# Patient Record
Sex: Male | Born: 1935 | Race: White | Hispanic: No | Marital: Married | Smoking: Never smoker
Health system: Southern US, Community
[De-identification: ages and names within clinical notes are randomized; demographics above are authoritative.]

## PROBLEM LIST (undated history)

## (undated) DIAGNOSIS — I10 Essential (primary) hypertension: Secondary | ICD-10-CM

## (undated) DIAGNOSIS — H269 Unspecified cataract: Secondary | ICD-10-CM

## (undated) DIAGNOSIS — M109 Gout, unspecified: Secondary | ICD-10-CM

## (undated) DIAGNOSIS — I509 Heart failure, unspecified: Secondary | ICD-10-CM

## (undated) DIAGNOSIS — D696 Thrombocytopenia, unspecified: Secondary | ICD-10-CM

## (undated) DIAGNOSIS — D72820 Lymphocytosis (symptomatic): Secondary | ICD-10-CM

## (undated) DIAGNOSIS — E7800 Pure hypercholesterolemia, unspecified: Secondary | ICD-10-CM

## (undated) DIAGNOSIS — C9110 Chronic lymphocytic leukemia of B-cell type not having achieved remission: Secondary | ICD-10-CM

## (undated) DIAGNOSIS — G4730 Sleep apnea, unspecified: Secondary | ICD-10-CM

## (undated) DIAGNOSIS — N320 Bladder-neck obstruction: Secondary | ICD-10-CM

## (undated) DIAGNOSIS — I499 Cardiac arrhythmia, unspecified: Secondary | ICD-10-CM

## (undated) HISTORY — DX: Thrombocytopenia, unspecified: D69.6

## (undated) HISTORY — DX: Sleep apnea, unspecified: G47.30

## (undated) HISTORY — DX: Heart failure, unspecified: I50.9

## (undated) HISTORY — DX: Pure hypercholesterolemia, unspecified: E78.00

## (undated) HISTORY — DX: Cardiac arrhythmia, unspecified: I49.9

## (undated) HISTORY — DX: Lymphocytosis (symptomatic): D72.820

## (undated) HISTORY — DX: Unspecified cataract: H26.9

## (undated) HISTORY — DX: Gout, unspecified: M10.9

## (undated) HISTORY — DX: Bladder-neck obstruction: N32.0

## (undated) HISTORY — DX: Essential (primary) hypertension: I10

## (undated) HISTORY — DX: Chronic lymphocytic leukemia of B-cell type not having achieved remission: C91.10

---

## 2004-02-21 ENCOUNTER — Other Ambulatory Visit: Payer: Self-pay

## 2005-09-09 ENCOUNTER — Ambulatory Visit: Payer: Self-pay | Admitting: Ophthalmology

## 2005-09-16 ENCOUNTER — Ambulatory Visit: Payer: Self-pay | Admitting: Ophthalmology

## 2006-08-11 ENCOUNTER — Ambulatory Visit: Payer: Self-pay | Admitting: Ophthalmology

## 2006-08-18 ENCOUNTER — Ambulatory Visit: Payer: Self-pay | Admitting: Ophthalmology

## 2006-10-04 ENCOUNTER — Ambulatory Visit: Payer: Self-pay | Admitting: Internal Medicine

## 2006-11-02 ENCOUNTER — Ambulatory Visit: Payer: Self-pay | Admitting: Internal Medicine

## 2006-11-02 HISTORY — PX: PROSTATE SURGERY: SHX751

## 2006-12-13 ENCOUNTER — Ambulatory Visit: Payer: Self-pay | Admitting: Internal Medicine

## 2007-01-01 ENCOUNTER — Ambulatory Visit: Payer: Self-pay | Admitting: Internal Medicine

## 2007-02-20 ENCOUNTER — Emergency Department: Payer: Self-pay | Admitting: Emergency Medicine

## 2007-03-11 ENCOUNTER — Ambulatory Visit: Payer: Self-pay | Admitting: Internal Medicine

## 2007-03-14 ENCOUNTER — Ambulatory Visit: Payer: Self-pay | Admitting: Internal Medicine

## 2007-04-03 ENCOUNTER — Ambulatory Visit: Payer: Self-pay | Admitting: Internal Medicine

## 2007-06-03 ENCOUNTER — Ambulatory Visit: Payer: Self-pay | Admitting: Internal Medicine

## 2007-07-04 ENCOUNTER — Ambulatory Visit: Payer: Self-pay | Admitting: Internal Medicine

## 2007-09-12 ENCOUNTER — Ambulatory Visit: Payer: Self-pay | Admitting: Internal Medicine

## 2007-10-03 ENCOUNTER — Ambulatory Visit: Payer: Self-pay | Admitting: Internal Medicine

## 2007-12-04 ENCOUNTER — Ambulatory Visit: Payer: Self-pay | Admitting: Internal Medicine

## 2007-12-05 ENCOUNTER — Ambulatory Visit: Payer: Self-pay | Admitting: Internal Medicine

## 2008-01-01 ENCOUNTER — Ambulatory Visit: Payer: Self-pay | Admitting: Internal Medicine

## 2008-03-02 ENCOUNTER — Ambulatory Visit: Payer: Self-pay | Admitting: Internal Medicine

## 2008-03-05 ENCOUNTER — Ambulatory Visit: Payer: Self-pay | Admitting: Internal Medicine

## 2008-04-02 ENCOUNTER — Ambulatory Visit: Payer: Self-pay | Admitting: Internal Medicine

## 2008-06-02 ENCOUNTER — Ambulatory Visit: Payer: Self-pay | Admitting: Internal Medicine

## 2008-06-11 ENCOUNTER — Ambulatory Visit: Payer: Self-pay | Admitting: Internal Medicine

## 2008-07-03 ENCOUNTER — Ambulatory Visit: Payer: Self-pay | Admitting: Internal Medicine

## 2008-09-02 ENCOUNTER — Ambulatory Visit: Payer: Self-pay | Admitting: Internal Medicine

## 2008-09-03 ENCOUNTER — Ambulatory Visit: Payer: Self-pay | Admitting: Internal Medicine

## 2008-10-02 ENCOUNTER — Ambulatory Visit: Payer: Self-pay | Admitting: Internal Medicine

## 2008-12-03 ENCOUNTER — Ambulatory Visit: Payer: Self-pay | Admitting: Internal Medicine

## 2008-12-10 ENCOUNTER — Ambulatory Visit: Payer: Self-pay | Admitting: Internal Medicine

## 2008-12-31 ENCOUNTER — Ambulatory Visit: Payer: Self-pay | Admitting: Internal Medicine

## 2009-03-09 ENCOUNTER — Emergency Department: Payer: Self-pay | Admitting: Unknown Physician Specialty

## 2009-03-11 ENCOUNTER — Ambulatory Visit: Payer: Self-pay | Admitting: Internal Medicine

## 2009-04-02 ENCOUNTER — Ambulatory Visit: Payer: Self-pay | Admitting: Internal Medicine

## 2009-06-02 ENCOUNTER — Ambulatory Visit: Payer: Self-pay | Admitting: Internal Medicine

## 2009-06-03 ENCOUNTER — Ambulatory Visit: Payer: Self-pay | Admitting: Internal Medicine

## 2009-07-03 ENCOUNTER — Ambulatory Visit: Payer: Self-pay | Admitting: Internal Medicine

## 2009-09-02 ENCOUNTER — Ambulatory Visit: Payer: Self-pay | Admitting: Internal Medicine

## 2009-10-02 ENCOUNTER — Ambulatory Visit: Payer: Self-pay | Admitting: Internal Medicine

## 2009-11-02 ENCOUNTER — Ambulatory Visit: Payer: Self-pay | Admitting: Internal Medicine

## 2009-12-02 ENCOUNTER — Ambulatory Visit: Payer: Self-pay | Admitting: Internal Medicine

## 2009-12-03 ENCOUNTER — Ambulatory Visit: Payer: Self-pay | Admitting: Internal Medicine

## 2010-01-31 ENCOUNTER — Ambulatory Visit: Payer: Self-pay | Admitting: Internal Medicine

## 2010-02-17 ENCOUNTER — Ambulatory Visit: Payer: Self-pay | Admitting: Internal Medicine

## 2010-03-02 ENCOUNTER — Ambulatory Visit: Payer: Self-pay | Admitting: Internal Medicine

## 2010-05-02 ENCOUNTER — Ambulatory Visit: Payer: Self-pay | Admitting: Internal Medicine

## 2010-05-19 ENCOUNTER — Ambulatory Visit: Payer: Self-pay | Admitting: Internal Medicine

## 2010-06-02 ENCOUNTER — Ambulatory Visit: Payer: Self-pay | Admitting: Internal Medicine

## 2010-07-03 ENCOUNTER — Ambulatory Visit: Payer: Self-pay | Admitting: Internal Medicine

## 2010-09-15 ENCOUNTER — Ambulatory Visit: Payer: Self-pay | Admitting: Internal Medicine

## 2010-10-02 ENCOUNTER — Ambulatory Visit: Payer: Self-pay | Admitting: Internal Medicine

## 2010-11-02 ENCOUNTER — Ambulatory Visit: Payer: Self-pay | Admitting: Internal Medicine

## 2011-01-05 ENCOUNTER — Ambulatory Visit: Payer: Self-pay | Admitting: Internal Medicine

## 2011-02-01 ENCOUNTER — Ambulatory Visit: Payer: Self-pay | Admitting: Internal Medicine

## 2011-04-13 ENCOUNTER — Ambulatory Visit: Payer: Self-pay | Admitting: Internal Medicine

## 2011-05-03 ENCOUNTER — Ambulatory Visit: Payer: Self-pay | Admitting: Internal Medicine

## 2011-08-17 ENCOUNTER — Ambulatory Visit: Payer: Self-pay | Admitting: Internal Medicine

## 2011-09-03 ENCOUNTER — Ambulatory Visit: Payer: Self-pay | Admitting: Internal Medicine

## 2011-10-08 ENCOUNTER — Ambulatory Visit: Payer: Self-pay

## 2011-10-12 ENCOUNTER — Ambulatory Visit: Payer: Self-pay | Admitting: Internal Medicine

## 2011-10-13 LAB — PSA: PSA: 3.3 ng/mL (ref 0.0–4.0)

## 2011-11-03 ENCOUNTER — Ambulatory Visit: Payer: Self-pay | Admitting: Internal Medicine

## 2011-11-09 ENCOUNTER — Ambulatory Visit: Payer: Self-pay | Admitting: Urology

## 2011-11-09 DIAGNOSIS — N400 Enlarged prostate without lower urinary tract symptoms: Secondary | ICD-10-CM | POA: Diagnosis not present

## 2011-11-09 DIAGNOSIS — R319 Hematuria, unspecified: Secondary | ICD-10-CM | POA: Diagnosis not present

## 2011-11-09 DIAGNOSIS — N281 Cyst of kidney, acquired: Secondary | ICD-10-CM | POA: Diagnosis not present

## 2011-11-11 DIAGNOSIS — N281 Cyst of kidney, acquired: Secondary | ICD-10-CM | POA: Diagnosis not present

## 2011-11-11 DIAGNOSIS — Z85828 Personal history of other malignant neoplasm of skin: Secondary | ICD-10-CM | POA: Diagnosis not present

## 2011-11-11 DIAGNOSIS — R319 Hematuria, unspecified: Secondary | ICD-10-CM | POA: Diagnosis not present

## 2011-11-11 DIAGNOSIS — D485 Neoplasm of uncertain behavior of skin: Secondary | ICD-10-CM | POA: Diagnosis not present

## 2011-12-03 DIAGNOSIS — D485 Neoplasm of uncertain behavior of skin: Secondary | ICD-10-CM | POA: Diagnosis not present

## 2012-01-12 ENCOUNTER — Ambulatory Visit: Payer: Self-pay | Admitting: Internal Medicine

## 2012-01-12 DIAGNOSIS — E7800 Pure hypercholesterolemia, unspecified: Secondary | ICD-10-CM | POA: Diagnosis not present

## 2012-01-12 DIAGNOSIS — L988 Other specified disorders of the skin and subcutaneous tissue: Secondary | ICD-10-CM | POA: Diagnosis not present

## 2012-01-12 DIAGNOSIS — E669 Obesity, unspecified: Secondary | ICD-10-CM | POA: Diagnosis not present

## 2012-01-12 DIAGNOSIS — Z79899 Other long term (current) drug therapy: Secondary | ICD-10-CM | POA: Diagnosis not present

## 2012-01-12 DIAGNOSIS — R109 Unspecified abdominal pain: Secondary | ICD-10-CM | POA: Diagnosis not present

## 2012-01-12 DIAGNOSIS — C9110 Chronic lymphocytic leukemia of B-cell type not having achieved remission: Secondary | ICD-10-CM | POA: Diagnosis not present

## 2012-01-12 DIAGNOSIS — G4730 Sleep apnea, unspecified: Secondary | ICD-10-CM | POA: Diagnosis not present

## 2012-01-12 DIAGNOSIS — N400 Enlarged prostate without lower urinary tract symptoms: Secondary | ICD-10-CM | POA: Diagnosis not present

## 2012-01-12 DIAGNOSIS — I10 Essential (primary) hypertension: Secondary | ICD-10-CM | POA: Diagnosis not present

## 2012-01-12 DIAGNOSIS — R7989 Other specified abnormal findings of blood chemistry: Secondary | ICD-10-CM | POA: Diagnosis not present

## 2012-01-12 DIAGNOSIS — M109 Gout, unspecified: Secondary | ICD-10-CM | POA: Diagnosis not present

## 2012-01-12 LAB — CBC CANCER CENTER
Basophil #: 0.7 x10 3/mm — ABNORMAL HIGH (ref 0.0–0.1)
Basophil %: 0.8 %
Comment - H1-Com3: NORMAL
Eosinophil #: 0.4 x10 3/mm (ref 0.0–0.7)
HCT: 48.7 % (ref 40.0–52.0)
Lymphocyte %: 85.4 %
Lymphocytes: 92 %
MCH: 30.3 pg (ref 26.0–34.0)
MCHC: 32.5 g/dL (ref 32.0–36.0)
Monocyte #: 2.3 x10 3/mm — ABNORMAL HIGH (ref 0.0–0.7)
Monocyte %: 2.6 %
Monocytes: 2 %
Neutrophil #: 9.5 x10 3/mm — ABNORMAL HIGH (ref 1.4–6.5)
Neutrophil %: 10.7 %
Platelet: 167 x10 3/mm (ref 150–440)
RDW: 13.7 % (ref 11.5–14.5)
Segmented Neutrophils: 6 %

## 2012-01-12 LAB — URINALYSIS, COMPLETE
Bacteria: NEGATIVE
Bilirubin,UR: NEGATIVE
Glucose,UR: NEGATIVE mg/dL (ref 0–75)
Ketone: NEGATIVE
Nitrite: NEGATIVE
Ph: 6 (ref 4.5–8.0)
Squamous Epithelial: NONE SEEN

## 2012-01-12 LAB — COMPREHENSIVE METABOLIC PANEL
Alkaline Phosphatase: 59 U/L (ref 50–136)
Anion Gap: 8 (ref 7–16)
Bilirubin,Total: 0.7 mg/dL (ref 0.2–1.0)
Chloride: 102 mmol/L (ref 98–107)
Co2: 31 mmol/L (ref 21–32)
Creatinine: 1.55 mg/dL — ABNORMAL HIGH (ref 0.60–1.30)
EGFR (African American): 56 — ABNORMAL LOW
EGFR (Non-African Amer.): 46 — ABNORMAL LOW
Osmolality: 285 (ref 275–301)
Potassium: 4.1 mmol/L (ref 3.5–5.1)
Sodium: 141 mmol/L (ref 136–145)
Total Protein: 6.7 g/dL (ref 6.4–8.2)

## 2012-01-14 LAB — URINE CULTURE

## 2012-01-19 DIAGNOSIS — R7989 Other specified abnormal findings of blood chemistry: Secondary | ICD-10-CM | POA: Diagnosis not present

## 2012-01-19 DIAGNOSIS — C9110 Chronic lymphocytic leukemia of B-cell type not having achieved remission: Secondary | ICD-10-CM | POA: Diagnosis not present

## 2012-01-19 DIAGNOSIS — R109 Unspecified abdominal pain: Secondary | ICD-10-CM | POA: Diagnosis not present

## 2012-01-19 LAB — CBC CANCER CENTER
Comment - H1-Com2: NORMAL
Comment - H1-Com3: NORMAL
HCT: 48.3 % (ref 40.0–52.0)
Lymphocytes: 95 %
Platelet: 160 x10 3/mm (ref 150–440)
RBC: 5.19 10*6/uL (ref 4.40–5.90)
Segmented Neutrophils: 4 %
WBC: 90.6 x10 3/mm — ABNORMAL HIGH (ref 3.8–10.6)

## 2012-01-19 LAB — CREATININE, SERUM
Creatinine: 1.51 mg/dL — ABNORMAL HIGH (ref 0.60–1.30)
EGFR (Non-African Amer.): 48 — ABNORMAL LOW

## 2012-02-01 ENCOUNTER — Ambulatory Visit: Payer: Self-pay | Admitting: Internal Medicine

## 2012-03-08 ENCOUNTER — Ambulatory Visit: Payer: Self-pay | Admitting: Internal Medicine

## 2012-03-08 DIAGNOSIS — C9110 Chronic lymphocytic leukemia of B-cell type not having achieved remission: Secondary | ICD-10-CM | POA: Diagnosis not present

## 2012-03-08 LAB — CBC CANCER CENTER
Basophil %: 0.3 %
Eosinophil #: 0.4 x10 3/mm (ref 0.0–0.7)
Eosinophil %: 0.5 %
HCT: 47.2 % (ref 40.0–52.0)
Lymphocyte #: 68.7 x10 3/mm — ABNORMAL HIGH (ref 1.0–3.6)
Lymphocyte %: 89.3 %
MCH: 30.1 pg (ref 26.0–34.0)
MCHC: 31.9 g/dL — ABNORMAL LOW (ref 32.0–36.0)
MCV: 94 fL (ref 80–100)
Monocyte %: 2.1 %
Neutrophil #: 6 x10 3/mm (ref 1.4–6.5)
Neutrophil %: 7.8 %
RBC: 5.01 10*6/uL (ref 4.40–5.90)

## 2012-03-08 LAB — URINALYSIS, COMPLETE
Bacteria: NEGATIVE
Glucose,UR: NEGATIVE mg/dL (ref 0–75)
Ketone: NEGATIVE
Leukocyte Esterase: NEGATIVE
Ph: 7 (ref 4.5–8.0)
Squamous Epithelial: NONE SEEN

## 2012-03-08 LAB — CREATININE, SERUM
Creatinine: 1.39 mg/dL — ABNORMAL HIGH (ref 0.60–1.30)
EGFR (African American): 55 — ABNORMAL LOW
EGFR (Non-African Amer.): 48 — ABNORMAL LOW

## 2012-04-02 ENCOUNTER — Ambulatory Visit: Payer: Self-pay | Admitting: Internal Medicine

## 2012-04-24 DIAGNOSIS — I499 Cardiac arrhythmia, unspecified: Secondary | ICD-10-CM

## 2012-05-24 ENCOUNTER — Ambulatory Visit: Payer: Self-pay | Admitting: Internal Medicine

## 2012-05-24 DIAGNOSIS — I10 Essential (primary) hypertension: Secondary | ICD-10-CM | POA: Diagnosis not present

## 2012-05-24 DIAGNOSIS — E7800 Pure hypercholesterolemia, unspecified: Secondary | ICD-10-CM | POA: Diagnosis not present

## 2012-05-24 DIAGNOSIS — Z79899 Other long term (current) drug therapy: Secondary | ICD-10-CM | POA: Diagnosis not present

## 2012-05-24 DIAGNOSIS — I455 Other specified heart block: Secondary | ICD-10-CM | POA: Diagnosis not present

## 2012-05-24 DIAGNOSIS — M109 Gout, unspecified: Secondary | ICD-10-CM | POA: Diagnosis not present

## 2012-05-24 DIAGNOSIS — I4949 Other premature depolarization: Secondary | ICD-10-CM | POA: Diagnosis not present

## 2012-05-24 DIAGNOSIS — C9110 Chronic lymphocytic leukemia of B-cell type not having achieved remission: Secondary | ICD-10-CM | POA: Diagnosis not present

## 2012-05-24 DIAGNOSIS — G4730 Sleep apnea, unspecified: Secondary | ICD-10-CM | POA: Diagnosis not present

## 2012-05-24 DIAGNOSIS — N400 Enlarged prostate without lower urinary tract symptoms: Secondary | ICD-10-CM | POA: Diagnosis not present

## 2012-05-24 LAB — URIC ACID: Uric Acid: 8.5 mg/dL — ABNORMAL HIGH (ref 3.5–7.2)

## 2012-05-24 LAB — CBC CANCER CENTER
Basophil #: 0.2 x10 3/mm — ABNORMAL HIGH (ref 0.0–0.1)
Eosinophil #: 0.3 x10 3/mm (ref 0.0–0.7)
HGB: 14.9 g/dL (ref 13.0–18.0)
Lymphocyte %: 89.9 %
MCH: 30.3 pg (ref 26.0–34.0)
MCHC: 32.2 g/dL (ref 32.0–36.0)
Monocyte #: 0.6 x10 3/mm (ref 0.2–1.0)
Monocyte %: 0.9 %
Neutrophil #: 5.4 x10 3/mm (ref 1.4–6.5)
Neutrophil %: 8.4 %
Platelet: 130 x10 3/mm — ABNORMAL LOW (ref 150–440)
RBC: 4.93 10*6/uL (ref 4.40–5.90)
RDW: 14.6 % — ABNORMAL HIGH (ref 11.5–14.5)
WBC: 65 x10 3/mm — ABNORMAL HIGH (ref 3.8–10.6)

## 2012-05-24 LAB — LACTATE DEHYDROGENASE: LDH: 145 U/L (ref 81–234)

## 2012-06-02 ENCOUNTER — Ambulatory Visit: Payer: Self-pay | Admitting: Internal Medicine

## 2012-06-02 DIAGNOSIS — C9110 Chronic lymphocytic leukemia of B-cell type not having achieved remission: Secondary | ICD-10-CM | POA: Diagnosis not present

## 2012-06-21 LAB — CBC CANCER CENTER
Basophil #: 0.1 x10 3/mm (ref 0.0–0.1)
Basophil %: 0.2 %
Eosinophil #: 0.3 x10 3/mm (ref 0.0–0.7)
Eosinophil %: 0.4 %
HCT: 47.3 % (ref 40.0–52.0)
HGB: 15.3 g/dL (ref 13.0–18.0)
Lymphocyte #: 66.6 x10 3/mm — ABNORMAL HIGH (ref 1.0–3.6)
Lymphocyte %: 89 %
MCH: 30.4 pg (ref 26.0–34.0)
MCHC: 32.4 g/dL (ref 32.0–36.0)
MCV: 94 fL (ref 80–100)
Monocyte #: 1.2 x10 3/mm — ABNORMAL HIGH (ref 0.2–1.0)
Neutrophil #: 6.6 x10 3/mm — ABNORMAL HIGH (ref 1.4–6.5)
Neutrophil %: 8.8 %
RDW: 15.1 % — ABNORMAL HIGH (ref 11.5–14.5)

## 2012-07-03 ENCOUNTER — Ambulatory Visit: Payer: Self-pay | Admitting: Internal Medicine

## 2012-08-16 ENCOUNTER — Ambulatory Visit: Payer: Self-pay | Admitting: Internal Medicine

## 2012-08-16 DIAGNOSIS — C9110 Chronic lymphocytic leukemia of B-cell type not having achieved remission: Secondary | ICD-10-CM | POA: Diagnosis not present

## 2012-08-16 DIAGNOSIS — M109 Gout, unspecified: Secondary | ICD-10-CM | POA: Diagnosis not present

## 2012-08-16 DIAGNOSIS — Z79899 Other long term (current) drug therapy: Secondary | ICD-10-CM | POA: Diagnosis not present

## 2012-08-16 DIAGNOSIS — G4730 Sleep apnea, unspecified: Secondary | ICD-10-CM | POA: Diagnosis not present

## 2012-08-16 DIAGNOSIS — E7800 Pure hypercholesterolemia, unspecified: Secondary | ICD-10-CM | POA: Diagnosis not present

## 2012-08-16 DIAGNOSIS — I10 Essential (primary) hypertension: Secondary | ICD-10-CM | POA: Diagnosis not present

## 2012-08-16 LAB — CBC CANCER CENTER
Eosinophil #: 0.3 x10 3/mm (ref 0.0–0.7)
Eosinophil %: 0.3 %
HCT: 49.7 % (ref 40.0–52.0)
Lymphocyte #: 78.7 x10 3/mm — ABNORMAL HIGH (ref 1.0–3.6)
Lymphocyte %: 90.3 %
MCV: 95 fL (ref 80–100)
Monocyte %: 1.4 %
Neutrophil #: 6.9 x10 3/mm — ABNORMAL HIGH (ref 1.4–6.5)
Neutrophil %: 7.9 %
RBC: 5.23 10*6/uL (ref 4.40–5.90)
RDW: 15.6 % — ABNORMAL HIGH (ref 11.5–14.5)
WBC: 87.2 x10 3/mm — ABNORMAL HIGH (ref 3.8–10.6)

## 2012-08-16 LAB — URIC ACID: Uric Acid: 6.8 mg/dL (ref 3.5–7.2)

## 2012-09-02 ENCOUNTER — Ambulatory Visit: Payer: Self-pay | Admitting: Internal Medicine

## 2012-10-17 ENCOUNTER — Ambulatory Visit: Payer: Self-pay | Admitting: Internal Medicine

## 2012-10-17 DIAGNOSIS — C9110 Chronic lymphocytic leukemia of B-cell type not having achieved remission: Secondary | ICD-10-CM | POA: Diagnosis not present

## 2012-10-17 LAB — CBC CANCER CENTER
Basophil #: 0.1 x10 3/mm (ref 0.0–0.1)
Basophil %: 0.1 %
HCT: 46.6 % (ref 40.0–52.0)
HGB: 14.9 g/dL (ref 13.0–18.0)
MCHC: 31.9 g/dL — ABNORMAL LOW (ref 32.0–36.0)
MCV: 94 fL (ref 80–100)
Monocyte %: 1.6 %
Neutrophil %: 11 %
Platelet: 136 x10 3/mm — ABNORMAL LOW (ref 150–440)
RBC: 4.99 10*6/uL (ref 4.40–5.90)
RDW: 14.9 % — ABNORMAL HIGH (ref 11.5–14.5)
WBC: 85.4 x10 3/mm — ABNORMAL HIGH (ref 3.8–10.6)

## 2012-10-17 LAB — LACTATE DEHYDROGENASE: LDH: 170 U/L (ref 85–241)

## 2012-11-02 ENCOUNTER — Ambulatory Visit: Payer: Self-pay | Admitting: Internal Medicine

## 2012-12-06 ENCOUNTER — Ambulatory Visit: Payer: Self-pay | Admitting: Internal Medicine

## 2012-12-06 DIAGNOSIS — E7800 Pure hypercholesterolemia, unspecified: Secondary | ICD-10-CM | POA: Diagnosis not present

## 2012-12-06 DIAGNOSIS — C9110 Chronic lymphocytic leukemia of B-cell type not having achieved remission: Secondary | ICD-10-CM | POA: Diagnosis not present

## 2012-12-06 DIAGNOSIS — G4730 Sleep apnea, unspecified: Secondary | ICD-10-CM | POA: Diagnosis not present

## 2012-12-06 DIAGNOSIS — M109 Gout, unspecified: Secondary | ICD-10-CM | POA: Diagnosis not present

## 2012-12-06 DIAGNOSIS — Z8719 Personal history of other diseases of the digestive system: Secondary | ICD-10-CM | POA: Diagnosis not present

## 2012-12-06 DIAGNOSIS — Z79899 Other long term (current) drug therapy: Secondary | ICD-10-CM | POA: Diagnosis not present

## 2012-12-06 DIAGNOSIS — I10 Essential (primary) hypertension: Secondary | ICD-10-CM | POA: Diagnosis not present

## 2012-12-06 LAB — CBC CANCER CENTER
Basophil #: 0.2 x10 3/mm — ABNORMAL HIGH (ref 0.0–0.1)
Eosinophil #: 0.2 x10 3/mm (ref 0.0–0.7)
Eosinophil %: 0.3 %
Lymphocyte #: 68.9 x10 3/mm — ABNORMAL HIGH (ref 1.0–3.6)
Lymphocyte %: 89.8 %
MCHC: 31.4 g/dL — ABNORMAL LOW (ref 32.0–36.0)
MCV: 94 fL (ref 80–100)
Neutrophil %: 8.6 %
Platelet: 142 x10 3/mm — ABNORMAL LOW (ref 150–440)
RDW: 15.2 % — ABNORMAL HIGH (ref 11.5–14.5)
WBC: 76.7 x10 3/mm — ABNORMAL HIGH (ref 3.8–10.6)

## 2012-12-06 LAB — CREATININE, SERUM: Creatinine: 1.54 mg/dL — ABNORMAL HIGH (ref 0.60–1.30)

## 2012-12-06 LAB — LACTATE DEHYDROGENASE: LDH: 152 U/L (ref 85–241)

## 2012-12-06 LAB — URIC ACID: Uric Acid: 5.8 mg/dL (ref 3.5–7.2)

## 2012-12-31 ENCOUNTER — Ambulatory Visit: Payer: Self-pay | Admitting: Internal Medicine

## 2013-01-31 ENCOUNTER — Ambulatory Visit: Payer: Self-pay | Admitting: Internal Medicine

## 2013-01-31 DIAGNOSIS — C9110 Chronic lymphocytic leukemia of B-cell type not having achieved remission: Secondary | ICD-10-CM | POA: Diagnosis not present

## 2013-02-06 LAB — CBC CANCER CENTER
Basophil #: 0.3 x10 3/mm — ABNORMAL HIGH (ref 0.0–0.1)
Basophil %: 0.3 %
HCT: 47.5 % (ref 40.0–52.0)
Lymphocyte #: 81.7 x10 3/mm — ABNORMAL HIGH (ref 1.0–3.6)
Lymphocyte %: 90.8 %
MCH: 29.7 pg (ref 26.0–34.0)
MCV: 93 fL (ref 80–100)
Neutrophil #: 6.5 x10 3/mm (ref 1.4–6.5)
Neutrophil %: 7.2 %
Platelet: 154 x10 3/mm (ref 150–440)

## 2013-02-06 LAB — CREATININE, SERUM
Creatinine: 1.75 mg/dL — ABNORMAL HIGH (ref 0.60–1.30)
EGFR (African American): 42 — ABNORMAL LOW
EGFR (Non-African Amer.): 36 — ABNORMAL LOW

## 2013-02-06 LAB — LACTATE DEHYDROGENASE: LDH: 179 U/L (ref 85–241)

## 2013-03-02 ENCOUNTER — Ambulatory Visit: Payer: Self-pay | Admitting: Internal Medicine

## 2013-04-03 ENCOUNTER — Ambulatory Visit: Payer: Self-pay | Admitting: Internal Medicine

## 2013-04-03 DIAGNOSIS — C9110 Chronic lymphocytic leukemia of B-cell type not having achieved remission: Secondary | ICD-10-CM | POA: Diagnosis not present

## 2013-04-03 LAB — URIC ACID: Uric Acid: 6.1 mg/dL (ref 3.5–7.2)

## 2013-04-03 LAB — CBC CANCER CENTER
Comment - H1-Com1: NORMAL
HCT: 47.4 % (ref 40.0–52.0)
Lymphocytes: 92 %
MCHC: 32.1 g/dL (ref 32.0–36.0)
MCV: 92 fL (ref 80–100)
Monocytes: 1 %
RDW: 15.6 % — ABNORMAL HIGH (ref 11.5–14.5)
WBC: 92.8 x10 3/mm — ABNORMAL HIGH (ref 3.8–10.6)

## 2013-04-03 LAB — CREATININE, SERUM
EGFR (African American): 49 — ABNORMAL LOW
EGFR (Non-African Amer.): 42 — ABNORMAL LOW

## 2013-05-02 ENCOUNTER — Ambulatory Visit: Payer: Self-pay | Admitting: Internal Medicine

## 2013-06-06 ENCOUNTER — Ambulatory Visit: Payer: Self-pay | Admitting: Internal Medicine

## 2013-06-06 DIAGNOSIS — N400 Enlarged prostate without lower urinary tract symptoms: Secondary | ICD-10-CM | POA: Diagnosis not present

## 2013-06-06 DIAGNOSIS — G4730 Sleep apnea, unspecified: Secondary | ICD-10-CM | POA: Diagnosis not present

## 2013-06-06 DIAGNOSIS — Z79899 Other long term (current) drug therapy: Secondary | ICD-10-CM | POA: Diagnosis not present

## 2013-06-06 DIAGNOSIS — M109 Gout, unspecified: Secondary | ICD-10-CM | POA: Diagnosis not present

## 2013-06-06 DIAGNOSIS — I10 Essential (primary) hypertension: Secondary | ICD-10-CM | POA: Diagnosis not present

## 2013-06-06 DIAGNOSIS — C9110 Chronic lymphocytic leukemia of B-cell type not having achieved remission: Secondary | ICD-10-CM | POA: Diagnosis not present

## 2013-06-06 DIAGNOSIS — E7800 Pure hypercholesterolemia, unspecified: Secondary | ICD-10-CM | POA: Diagnosis not present

## 2013-06-06 LAB — CBC CANCER CENTER
Eosinophil #: 0.4 x10 3/mm (ref 0.0–0.7)
Eosinophil %: 0.6 %
HCT: 47.8 % (ref 40.0–52.0)
Lymphocyte #: 71.7 x10 3/mm — ABNORMAL HIGH (ref 1.0–3.6)
Lymphocyte %: 89.5 %
MCH: 29.7 pg (ref 26.0–34.0)
MCHC: 31.9 g/dL — ABNORMAL LOW (ref 32.0–36.0)
MCV: 93 fL (ref 80–100)
Monocyte #: 1 x10 3/mm (ref 0.2–1.0)
Neutrophil %: 8.3 %
Platelet: 128 x10 3/mm — ABNORMAL LOW (ref 150–440)
RBC: 5.13 10*6/uL (ref 4.40–5.90)
WBC: 80.1 x10 3/mm — ABNORMAL HIGH (ref 3.8–10.6)

## 2013-06-06 LAB — CREATININE, SERUM
EGFR (African American): 50 — ABNORMAL LOW
EGFR (Non-African Amer.): 43 — ABNORMAL LOW

## 2013-06-06 LAB — URIC ACID: Uric Acid: 5.6 mg/dL (ref 3.5–7.2)

## 2013-06-06 LAB — LACTATE DEHYDROGENASE: LDH: 146 U/L (ref 85–241)

## 2013-07-03 ENCOUNTER — Ambulatory Visit: Payer: Self-pay | Admitting: Internal Medicine

## 2013-08-08 ENCOUNTER — Ambulatory Visit: Payer: Self-pay | Admitting: Internal Medicine

## 2013-08-08 DIAGNOSIS — C9110 Chronic lymphocytic leukemia of B-cell type not having achieved remission: Secondary | ICD-10-CM | POA: Diagnosis not present

## 2013-08-08 LAB — CBC CANCER CENTER
HCT: 48 % (ref 40.0–52.0)
HGB: 15 g/dL (ref 13.0–18.0)
Lymphocyte #: 73.6 x10 3/mm — ABNORMAL HIGH (ref 1.0–3.6)
MCHC: 31.3 g/dL — ABNORMAL LOW (ref 32.0–36.0)
MCV: 93 fL (ref 80–100)
Monocyte #: 0.8 x10 3/mm (ref 0.2–1.0)
Monocyte %: 0.9 %
Neutrophil #: 6.3 x10 3/mm (ref 1.4–6.5)
Neutrophil %: 7.7 %
Platelet: 123 x10 3/mm — ABNORMAL LOW (ref 150–440)
WBC: 81.1 x10 3/mm — ABNORMAL HIGH (ref 3.8–10.6)

## 2013-08-08 LAB — CREATININE, SERUM
Creatinine: 1.52 mg/dL — ABNORMAL HIGH (ref 0.60–1.30)
EGFR (African American): 49 — ABNORMAL LOW

## 2013-08-08 LAB — URIC ACID: Uric Acid: 5.3 mg/dL (ref 3.5–7.2)

## 2013-09-02 ENCOUNTER — Ambulatory Visit: Payer: Self-pay | Admitting: Internal Medicine

## 2013-10-03 ENCOUNTER — Ambulatory Visit: Payer: Self-pay | Admitting: Internal Medicine

## 2013-10-03 DIAGNOSIS — C9110 Chronic lymphocytic leukemia of B-cell type not having achieved remission: Secondary | ICD-10-CM | POA: Diagnosis not present

## 2013-10-03 LAB — CBC CANCER CENTER
Basophil %: 0.2 %
HGB: 14.9 g/dL (ref 13.0–18.0)
Lymphocyte #: 78.1 x10 3/mm — ABNORMAL HIGH (ref 1.0–3.6)
Lymphocyte %: 91.8 %
MCH: 30 pg (ref 26.0–34.0)
MCHC: 31.5 g/dL — ABNORMAL LOW (ref 32.0–36.0)
MCV: 95 fL (ref 80–100)
Platelet: 127 x10 3/mm — ABNORMAL LOW (ref 150–440)
RBC: 4.97 10*6/uL (ref 4.40–5.90)
RDW: 15.4 % — ABNORMAL HIGH (ref 11.5–14.5)

## 2013-10-03 LAB — LACTATE DEHYDROGENASE: LDH: 159 U/L (ref 85–241)

## 2013-10-03 LAB — CREATININE, SERUM
EGFR (African American): 58 — ABNORMAL LOW
EGFR (Non-African Amer.): 50 — ABNORMAL LOW

## 2013-10-03 LAB — URIC ACID: Uric Acid: 6.8 mg/dL (ref 3.5–7.2)

## 2013-11-02 ENCOUNTER — Ambulatory Visit: Payer: Self-pay | Admitting: Internal Medicine

## 2013-12-05 ENCOUNTER — Ambulatory Visit: Payer: Self-pay | Admitting: Internal Medicine

## 2013-12-05 DIAGNOSIS — C9110 Chronic lymphocytic leukemia of B-cell type not having achieved remission: Secondary | ICD-10-CM | POA: Diagnosis not present

## 2013-12-05 DIAGNOSIS — Z808 Family history of malignant neoplasm of other organs or systems: Secondary | ICD-10-CM | POA: Diagnosis not present

## 2013-12-05 DIAGNOSIS — R32 Unspecified urinary incontinence: Secondary | ICD-10-CM | POA: Diagnosis not present

## 2013-12-05 DIAGNOSIS — G4730 Sleep apnea, unspecified: Secondary | ICD-10-CM | POA: Diagnosis not present

## 2013-12-05 DIAGNOSIS — Z79899 Other long term (current) drug therapy: Secondary | ICD-10-CM | POA: Diagnosis not present

## 2013-12-05 DIAGNOSIS — M109 Gout, unspecified: Secondary | ICD-10-CM | POA: Diagnosis not present

## 2013-12-05 DIAGNOSIS — Z801 Family history of malignant neoplasm of trachea, bronchus and lung: Secondary | ICD-10-CM | POA: Diagnosis not present

## 2013-12-05 DIAGNOSIS — Z8042 Family history of malignant neoplasm of prostate: Secondary | ICD-10-CM | POA: Diagnosis not present

## 2013-12-05 DIAGNOSIS — N400 Enlarged prostate without lower urinary tract symptoms: Secondary | ICD-10-CM | POA: Diagnosis not present

## 2013-12-05 DIAGNOSIS — E7800 Pure hypercholesterolemia, unspecified: Secondary | ICD-10-CM | POA: Diagnosis not present

## 2013-12-05 DIAGNOSIS — I10 Essential (primary) hypertension: Secondary | ICD-10-CM | POA: Diagnosis not present

## 2013-12-05 LAB — CBC CANCER CENTER
BLAST: 3 % — AB
COMMENT - H1-COM4: NORMAL
HCT: 49.8 % (ref 40.0–52.0)
HGB: 15.5 g/dL (ref 13.0–18.0)
LYMPHS PCT: 87 %
MCH: 29.3 pg (ref 26.0–34.0)
MCHC: 31.1 g/dL — AB (ref 32.0–36.0)
MCV: 94 fL (ref 80–100)
MONOS PCT: 3 %
Platelet: 141 x10 3/mm — ABNORMAL LOW (ref 150–440)
RBC: 5.28 10*6/uL (ref 4.40–5.90)
RDW: 14.9 % — ABNORMAL HIGH (ref 11.5–14.5)
Segmented Neutrophils: 7 %
WBC: 93.9 x10 3/mm — ABNORMAL HIGH (ref 3.8–10.6)

## 2013-12-05 LAB — CREATININE, SERUM
Creatinine: 1.5 mg/dL — ABNORMAL HIGH (ref 0.60–1.30)
EGFR (African American): 50 — ABNORMAL LOW
EGFR (Non-African Amer.): 43 — ABNORMAL LOW

## 2013-12-05 LAB — LACTATE DEHYDROGENASE: LDH: 169 U/L (ref 85–241)

## 2013-12-05 LAB — URIC ACID: Uric Acid: 5.1 mg/dL (ref 3.5–7.2)

## 2013-12-06 DIAGNOSIS — N401 Enlarged prostate with lower urinary tract symptoms: Secondary | ICD-10-CM | POA: Diagnosis not present

## 2013-12-06 DIAGNOSIS — N138 Other obstructive and reflux uropathy: Secondary | ICD-10-CM | POA: Diagnosis not present

## 2013-12-11 DIAGNOSIS — R972 Elevated prostate specific antigen [PSA]: Secondary | ICD-10-CM | POA: Diagnosis not present

## 2013-12-18 DIAGNOSIS — N410 Acute prostatitis: Secondary | ICD-10-CM | POA: Diagnosis not present

## 2013-12-29 DIAGNOSIS — N320 Bladder-neck obstruction: Secondary | ICD-10-CM | POA: Diagnosis not present

## 2013-12-29 DIAGNOSIS — N138 Other obstructive and reflux uropathy: Secondary | ICD-10-CM | POA: Diagnosis not present

## 2013-12-29 DIAGNOSIS — R972 Elevated prostate specific antigen [PSA]: Secondary | ICD-10-CM | POA: Diagnosis not present

## 2013-12-29 DIAGNOSIS — N401 Enlarged prostate with lower urinary tract symptoms: Secondary | ICD-10-CM | POA: Diagnosis not present

## 2013-12-31 ENCOUNTER — Ambulatory Visit: Payer: Self-pay | Admitting: Internal Medicine

## 2014-01-23 DIAGNOSIS — N401 Enlarged prostate with lower urinary tract symptoms: Secondary | ICD-10-CM | POA: Diagnosis not present

## 2014-01-23 DIAGNOSIS — N138 Other obstructive and reflux uropathy: Secondary | ICD-10-CM | POA: Diagnosis not present

## 2014-01-30 DIAGNOSIS — N401 Enlarged prostate with lower urinary tract symptoms: Secondary | ICD-10-CM | POA: Diagnosis not present

## 2014-01-30 DIAGNOSIS — N138 Other obstructive and reflux uropathy: Secondary | ICD-10-CM | POA: Diagnosis not present

## 2014-02-06 ENCOUNTER — Ambulatory Visit: Payer: Self-pay | Admitting: Internal Medicine

## 2014-02-06 DIAGNOSIS — C9110 Chronic lymphocytic leukemia of B-cell type not having achieved remission: Secondary | ICD-10-CM | POA: Diagnosis not present

## 2014-02-06 LAB — URIC ACID: URIC ACID: 5.5 mg/dL (ref 3.5–7.2)

## 2014-02-06 LAB — CBC CANCER CENTER
HCT: 46.8 % (ref 40.0–52.0)
HGB: 14.5 g/dL (ref 13.0–18.0)
Lymphocytes: 89 %
MCH: 29.6 pg (ref 26.0–34.0)
MCHC: 31.1 g/dL — AB (ref 32.0–36.0)
MCV: 95 fL (ref 80–100)
MONOS PCT: 2 %
PLATELETS: 137 x10 3/mm — AB (ref 150–440)
RBC: 4.92 10*6/uL (ref 4.40–5.90)
RDW: 15.3 % — AB (ref 11.5–14.5)
SEGMENTED NEUTROPHILS: 9 %
WBC: 100.6 x10 3/mm — AB (ref 3.8–10.6)

## 2014-02-06 LAB — LACTATE DEHYDROGENASE: LDH: 150 U/L (ref 85–241)

## 2014-02-06 LAB — CREATININE, SERUM
CREATININE: 1.52 mg/dL — AB (ref 0.60–1.30)
EGFR (African American): 49 — ABNORMAL LOW
EGFR (Non-African Amer.): 42 — ABNORMAL LOW

## 2014-02-28 DIAGNOSIS — R972 Elevated prostate specific antigen [PSA]: Secondary | ICD-10-CM | POA: Diagnosis not present

## 2014-03-02 ENCOUNTER — Ambulatory Visit: Payer: Self-pay | Admitting: Internal Medicine

## 2014-04-03 ENCOUNTER — Ambulatory Visit: Payer: Self-pay | Admitting: Internal Medicine

## 2014-04-03 DIAGNOSIS — C9110 Chronic lymphocytic leukemia of B-cell type not having achieved remission: Secondary | ICD-10-CM | POA: Diagnosis not present

## 2014-04-03 LAB — CBC CANCER CENTER
BASOS ABS: 0.3 x10 3/mm — AB (ref 0.0–0.1)
Basophil %: 0.3 %
Eosinophil #: 0.2 x10 3/mm (ref 0.0–0.7)
Eosinophil %: 0.2 %
HCT: 46.4 % (ref 40.0–52.0)
HGB: 14.5 g/dL (ref 13.0–18.0)
LYMPHS ABS: 90.4 x10 3/mm — AB (ref 1.0–3.6)
LYMPHS PCT: 91.5 %
MCH: 29.5 pg (ref 26.0–34.0)
MCHC: 31.2 g/dL — AB (ref 32.0–36.0)
MCV: 95 fL (ref 80–100)
MONO ABS: 1 x10 3/mm (ref 0.2–1.0)
MONOS PCT: 1 %
NEUTROS ABS: 6.9 x10 3/mm — AB (ref 1.4–6.5)
NEUTROS PCT: 7 %
Platelet: 125 x10 3/mm — ABNORMAL LOW (ref 150–440)
RBC: 4.89 10*6/uL (ref 4.40–5.90)
RDW: 15.3 % — AB (ref 11.5–14.5)
WBC: 98.8 x10 3/mm — ABNORMAL HIGH (ref 3.8–10.6)

## 2014-04-03 LAB — CREATININE, SERUM
Creatinine: 1.37 mg/dL — ABNORMAL HIGH (ref 0.60–1.30)
EGFR (African American): 56 — ABNORMAL LOW
GFR CALC NON AF AMER: 48 — AB

## 2014-04-03 LAB — URIC ACID: Uric Acid: 5.6 mg/dL (ref 3.5–7.2)

## 2014-04-03 LAB — LACTATE DEHYDROGENASE: LDH: 155 U/L (ref 85–241)

## 2014-04-30 DIAGNOSIS — R972 Elevated prostate specific antigen [PSA]: Secondary | ICD-10-CM | POA: Diagnosis not present

## 2014-04-30 DIAGNOSIS — N401 Enlarged prostate with lower urinary tract symptoms: Secondary | ICD-10-CM | POA: Diagnosis not present

## 2014-05-02 ENCOUNTER — Ambulatory Visit: Payer: Self-pay | Admitting: Internal Medicine

## 2014-05-02 DIAGNOSIS — N401 Enlarged prostate with lower urinary tract symptoms: Secondary | ICD-10-CM | POA: Diagnosis not present

## 2014-05-02 DIAGNOSIS — R972 Elevated prostate specific antigen [PSA]: Secondary | ICD-10-CM | POA: Diagnosis not present

## 2014-06-05 ENCOUNTER — Ambulatory Visit: Payer: Self-pay | Admitting: Internal Medicine

## 2014-06-05 DIAGNOSIS — G4730 Sleep apnea, unspecified: Secondary | ICD-10-CM | POA: Diagnosis not present

## 2014-06-05 DIAGNOSIS — M109 Gout, unspecified: Secondary | ICD-10-CM | POA: Diagnosis not present

## 2014-06-05 DIAGNOSIS — E7800 Pure hypercholesterolemia, unspecified: Secondary | ICD-10-CM | POA: Diagnosis not present

## 2014-06-05 DIAGNOSIS — Z801 Family history of malignant neoplasm of trachea, bronchus and lung: Secondary | ICD-10-CM | POA: Diagnosis not present

## 2014-06-05 DIAGNOSIS — I10 Essential (primary) hypertension: Secondary | ICD-10-CM | POA: Diagnosis not present

## 2014-06-05 DIAGNOSIS — Z8042 Family history of malignant neoplasm of prostate: Secondary | ICD-10-CM | POA: Diagnosis not present

## 2014-06-05 DIAGNOSIS — C9110 Chronic lymphocytic leukemia of B-cell type not having achieved remission: Secondary | ICD-10-CM | POA: Diagnosis not present

## 2014-06-05 DIAGNOSIS — N400 Enlarged prostate without lower urinary tract symptoms: Secondary | ICD-10-CM | POA: Diagnosis not present

## 2014-06-05 DIAGNOSIS — Z808 Family history of malignant neoplasm of other organs or systems: Secondary | ICD-10-CM | POA: Diagnosis not present

## 2014-06-05 DIAGNOSIS — Z79899 Other long term (current) drug therapy: Secondary | ICD-10-CM | POA: Diagnosis not present

## 2014-06-05 LAB — CREATININE, SERUM
Creatinine: 1.41 mg/dL — ABNORMAL HIGH (ref 0.60–1.30)
EGFR (Non-African Amer.): 46 — ABNORMAL LOW
GFR CALC AF AMER: 54 — AB

## 2014-06-05 LAB — CBC CANCER CENTER
Basophil #: 0.1 x10 3/mm (ref 0.0–0.1)
Basophil %: 0.1 %
EOS ABS: 0.3 x10 3/mm (ref 0.0–0.7)
Eosinophil %: 0.3 %
HCT: 46.7 % (ref 40.0–52.0)
HGB: 14.6 g/dL (ref 13.0–18.0)
LYMPHS ABS: 96 x10 3/mm — AB (ref 1.0–3.6)
LYMPHS PCT: 92.4 %
MCH: 29.8 pg (ref 26.0–34.0)
MCHC: 31.2 g/dL — ABNORMAL LOW (ref 32.0–36.0)
MCV: 96 fL (ref 80–100)
Monocyte #: 1.7 x10 3/mm — ABNORMAL HIGH (ref 0.2–1.0)
Monocyte %: 1.7 %
NEUTROS PCT: 5.5 %
Neutrophil #: 5.7 x10 3/mm (ref 1.4–6.5)
Platelet: 126 x10 3/mm — ABNORMAL LOW (ref 150–440)
RBC: 4.88 10*6/uL (ref 4.40–5.90)
RDW: 15 % — ABNORMAL HIGH (ref 11.5–14.5)
WBC: 103.8 x10 3/mm — ABNORMAL HIGH (ref 3.8–10.6)

## 2014-06-05 LAB — URIC ACID: Uric Acid: 5.4 mg/dL (ref 3.5–7.2)

## 2014-06-05 LAB — LACTATE DEHYDROGENASE: LDH: 154 U/L (ref 85–241)

## 2014-07-03 ENCOUNTER — Ambulatory Visit: Payer: Self-pay | Admitting: Internal Medicine

## 2014-08-14 ENCOUNTER — Ambulatory Visit: Payer: Self-pay | Admitting: Internal Medicine

## 2014-08-14 DIAGNOSIS — C9510 Chronic leukemia of unspecified cell type not having achieved remission: Secondary | ICD-10-CM | POA: Diagnosis not present

## 2014-08-15 DIAGNOSIS — C9510 Chronic leukemia of unspecified cell type not having achieved remission: Secondary | ICD-10-CM | POA: Diagnosis not present

## 2014-08-15 LAB — CBC CANCER CENTER
Basophil #: 0.1 x10 3/mm (ref 0.0–0.1)
Basophil %: 0.1 %
EOS ABS: 0.3 x10 3/mm (ref 0.0–0.7)
Eosinophil %: 0.3 %
HCT: 46.4 % (ref 40.0–52.0)
HGB: 14.6 g/dL (ref 13.0–18.0)
Lymphocyte #: 88.3 x10 3/mm — ABNORMAL HIGH (ref 1.0–3.6)
Lymphocyte %: 93.4 %
MCH: 30.2 pg (ref 26.0–34.0)
MCHC: 31.4 g/dL — AB (ref 32.0–36.0)
MCV: 96 fL (ref 80–100)
MONOS PCT: 0.7 %
Monocyte #: 0.7 x10 3/mm (ref 0.2–1.0)
NEUTROS ABS: 5.2 x10 3/mm (ref 1.4–6.5)
Neutrophil %: 5.5 %
Platelet: 128 x10 3/mm — ABNORMAL LOW (ref 150–440)
RBC: 4.83 10*6/uL (ref 4.40–5.90)
RDW: 15.4 % — ABNORMAL HIGH (ref 11.5–14.5)
WBC: 94.7 x10 3/mm — ABNORMAL HIGH (ref 3.8–10.6)

## 2014-08-15 LAB — CREATININE, SERUM
CREATININE: 1.47 mg/dL — AB (ref 0.60–1.30)
EGFR (Non-African Amer.): 49 — ABNORMAL LOW
GFR CALC AF AMER: 59 — AB

## 2014-08-15 LAB — URIC ACID: URIC ACID: 5.6 mg/dL (ref 3.5–7.2)

## 2014-08-15 LAB — LACTATE DEHYDROGENASE: LDH: 163 U/L (ref 85–241)

## 2014-09-02 ENCOUNTER — Ambulatory Visit: Payer: Self-pay | Admitting: Oncology

## 2014-09-02 ENCOUNTER — Ambulatory Visit: Payer: Self-pay | Admitting: Internal Medicine

## 2014-09-02 DIAGNOSIS — R5383 Other fatigue: Secondary | ICD-10-CM | POA: Diagnosis not present

## 2014-09-02 DIAGNOSIS — E780 Pure hypercholesterolemia: Secondary | ICD-10-CM | POA: Diagnosis not present

## 2014-09-02 DIAGNOSIS — D696 Thrombocytopenia, unspecified: Secondary | ICD-10-CM | POA: Diagnosis not present

## 2014-09-02 DIAGNOSIS — C9510 Chronic leukemia of unspecified cell type not having achieved remission: Secondary | ICD-10-CM | POA: Diagnosis not present

## 2014-09-02 DIAGNOSIS — Z8042 Family history of malignant neoplasm of prostate: Secondary | ICD-10-CM | POA: Diagnosis not present

## 2014-09-02 DIAGNOSIS — R531 Weakness: Secondary | ICD-10-CM | POA: Diagnosis not present

## 2014-09-02 DIAGNOSIS — R05 Cough: Secondary | ICD-10-CM | POA: Diagnosis not present

## 2014-09-02 DIAGNOSIS — M109 Gout, unspecified: Secondary | ICD-10-CM | POA: Diagnosis not present

## 2014-09-02 DIAGNOSIS — R509 Fever, unspecified: Secondary | ICD-10-CM | POA: Diagnosis not present

## 2014-09-02 DIAGNOSIS — I10 Essential (primary) hypertension: Secondary | ICD-10-CM | POA: Diagnosis not present

## 2014-09-02 DIAGNOSIS — Z801 Family history of malignant neoplasm of trachea, bronchus and lung: Secondary | ICD-10-CM | POA: Diagnosis not present

## 2014-09-02 DIAGNOSIS — G4730 Sleep apnea, unspecified: Secondary | ICD-10-CM | POA: Diagnosis not present

## 2014-09-02 DIAGNOSIS — Z23 Encounter for immunization: Secondary | ICD-10-CM | POA: Diagnosis not present

## 2014-09-02 DIAGNOSIS — Z79899 Other long term (current) drug therapy: Secondary | ICD-10-CM | POA: Diagnosis not present

## 2014-09-14 DIAGNOSIS — C9510 Chronic leukemia of unspecified cell type not having achieved remission: Secondary | ICD-10-CM | POA: Diagnosis not present

## 2014-09-14 DIAGNOSIS — R509 Fever, unspecified: Secondary | ICD-10-CM | POA: Diagnosis not present

## 2014-09-14 DIAGNOSIS — R05 Cough: Secondary | ICD-10-CM | POA: Diagnosis not present

## 2014-09-14 DIAGNOSIS — D696 Thrombocytopenia, unspecified: Secondary | ICD-10-CM | POA: Diagnosis not present

## 2014-09-14 LAB — CBC CANCER CENTER
BASOS ABS: 0.1 x10 3/mm (ref 0.0–0.1)
BASOS PCT: 0.1 %
EOS ABS: 0.4 x10 3/mm (ref 0.0–0.7)
Eosinophil %: 0.4 %
HCT: 50.4 % (ref 40.0–52.0)
HGB: 15.6 g/dL (ref 13.0–18.0)
LYMPHS ABS: 76.7 x10 3/mm — AB (ref 1.0–3.6)
Lymphocyte %: 84.8 %
MCH: 29.6 pg (ref 26.0–34.0)
MCHC: 30.9 g/dL — ABNORMAL LOW (ref 32.0–36.0)
MCV: 96 fL (ref 80–100)
Monocyte #: 1.8 x10 3/mm — ABNORMAL HIGH (ref 0.2–1.0)
Monocyte %: 2 %
Neutrophil #: 11.4 x10 3/mm — ABNORMAL HIGH (ref 1.4–6.5)
Neutrophil %: 12.7 %
PLATELETS: 128 x10 3/mm — AB (ref 150–440)
RBC: 5.26 10*6/uL (ref 4.40–5.90)
RDW: 15.5 % — AB (ref 11.5–14.5)
WBC: 90.4 x10 3/mm — ABNORMAL HIGH (ref 3.8–10.6)

## 2014-09-14 LAB — COMPREHENSIVE METABOLIC PANEL
ALK PHOS: 49 U/L
AST: 21 U/L (ref 15–37)
Albumin: 3.6 g/dL (ref 3.4–5.0)
Anion Gap: 9 (ref 7–16)
BUN: 14 mg/dL (ref 7–18)
Bilirubin,Total: 1.1 mg/dL — ABNORMAL HIGH (ref 0.2–1.0)
Calcium, Total: 8.8 mg/dL (ref 8.5–10.1)
Chloride: 101 mmol/L (ref 98–107)
Co2: 27 mmol/L (ref 21–32)
Creatinine: 1.41 mg/dL — ABNORMAL HIGH (ref 0.60–1.30)
EGFR (African American): >60
EGFR (Non-African Amer.): 51 — ABNORMAL LOW
Glucose: 123 mg/dL — ABNORMAL HIGH (ref 65–99)
Osmolality: 276 (ref 275–301)
POTASSIUM: 4 mmol/L (ref 3.5–5.1)
SGPT (ALT): 26 U/L
Sodium: 137 mmol/L (ref 136–145)
Total Protein: 6.7 g/dL (ref 6.4–8.2)

## 2014-09-14 LAB — RAPID INFLUENZA A&B ANTIGENS (ARMC ONLY)

## 2014-10-02 ENCOUNTER — Ambulatory Visit: Payer: Self-pay | Admitting: Internal Medicine

## 2014-10-02 ENCOUNTER — Ambulatory Visit: Payer: Self-pay | Admitting: Oncology

## 2014-10-02 DIAGNOSIS — C9510 Chronic leukemia of unspecified cell type not having achieved remission: Secondary | ICD-10-CM | POA: Diagnosis not present

## 2014-10-02 DIAGNOSIS — D696 Thrombocytopenia, unspecified: Secondary | ICD-10-CM | POA: Diagnosis not present

## 2014-10-16 LAB — CREATININE, SERUM
Creatinine: 1.29 mg/dL (ref 0.60–1.30)
EGFR (African American): >60
EGFR (Non-African Amer.): 57 — ABNORMAL LOW

## 2014-10-16 LAB — URIC ACID: URIC ACID: 5.2 mg/dL (ref 3.5–7.2)

## 2014-10-16 LAB — LACTATE DEHYDROGENASE: LDH: 134 U/L (ref 85–241)

## 2014-10-16 LAB — CBC CANCER CENTER
Comment - H1-Com2: NORMAL
HCT: 45.2 % (ref 40.0–52.0)
HGB: 14.2 g/dL (ref 13.0–18.0)
Lymphocytes: 92 %
MCH: 30.1 pg (ref 26.0–34.0)
MCHC: 31.3 g/dL — AB (ref 32.0–36.0)
MCV: 96 fL (ref 80–100)
Monocytes: 2 %
PLATELETS: 214 x10 3/mm (ref 150–440)
RBC: 4.71 10*6/uL (ref 4.40–5.90)
RDW: 16.2 % — AB (ref 11.5–14.5)
Segmented Neutrophils: 6 %
WBC: 143 x10 3/mm — ABNORMAL HIGH (ref 3.8–10.6)

## 2014-10-30 DIAGNOSIS — R972 Elevated prostate specific antigen [PSA]: Secondary | ICD-10-CM | POA: Diagnosis not present

## 2014-10-30 DIAGNOSIS — N400 Enlarged prostate without lower urinary tract symptoms: Secondary | ICD-10-CM | POA: Diagnosis not present

## 2014-10-30 LAB — CBC CANCER CENTER
BASOS ABS: 0.3 x10 3/mm — AB (ref 0.0–0.1)
Basophil %: 0.3 %
EOS PCT: 0.3 %
Eosinophil #: 0.3 x10 3/mm (ref 0.0–0.7)
HCT: 43.7 % (ref 40.0–52.0)
HGB: 13.5 g/dL (ref 13.0–18.0)
LYMPHS ABS: 97.2 x10 3/mm — AB (ref 1.0–3.6)
Lymphocyte %: 91.1 %
MCH: 29.8 pg (ref 26.0–34.0)
MCHC: 30.9 g/dL — ABNORMAL LOW (ref 32.0–36.0)
MCV: 96 fL (ref 80–100)
Monocyte #: 1.3 x10 3/mm — ABNORMAL HIGH (ref 0.2–1.0)
Monocyte %: 1.3 %
Neutrophil #: 7.4 x10 3/mm — ABNORMAL HIGH (ref 1.4–6.5)
Neutrophil %: 7 %
Platelet: 159 x10 3/mm (ref 150–440)
RBC: 4.54 10*6/uL (ref 4.40–5.90)
RDW: 16.5 % — AB (ref 11.5–14.5)
WBC: 106.6 x10 3/mm — AB (ref 3.8–10.6)

## 2014-10-30 LAB — LACTATE DEHYDROGENASE: LDH: 151 U/L (ref 85–241)

## 2014-10-30 LAB — CREATININE, SERUM
Creatinine: 1.36 mg/dL — ABNORMAL HIGH (ref 0.60–1.30)
GFR CALC NON AF AMER: 53 — AB

## 2014-10-30 LAB — URIC ACID: URIC ACID: 6.6 mg/dL (ref 3.5–7.2)

## 2014-11-02 ENCOUNTER — Ambulatory Visit: Payer: Self-pay | Admitting: Internal Medicine

## 2014-11-02 DIAGNOSIS — C9510 Chronic leukemia of unspecified cell type not having achieved remission: Secondary | ICD-10-CM | POA: Diagnosis not present

## 2014-11-02 DIAGNOSIS — I10 Essential (primary) hypertension: Secondary | ICD-10-CM | POA: Diagnosis not present

## 2014-11-02 DIAGNOSIS — Z79899 Other long term (current) drug therapy: Secondary | ICD-10-CM | POA: Diagnosis not present

## 2014-11-02 DIAGNOSIS — M109 Gout, unspecified: Secondary | ICD-10-CM | POA: Diagnosis not present

## 2014-11-02 DIAGNOSIS — E780 Pure hypercholesterolemia: Secondary | ICD-10-CM | POA: Diagnosis not present

## 2014-11-02 DIAGNOSIS — G4730 Sleep apnea, unspecified: Secondary | ICD-10-CM | POA: Diagnosis not present

## 2014-11-02 DIAGNOSIS — D696 Thrombocytopenia, unspecified: Secondary | ICD-10-CM | POA: Diagnosis not present

## 2014-11-13 DIAGNOSIS — C9510 Chronic leukemia of unspecified cell type not having achieved remission: Secondary | ICD-10-CM | POA: Diagnosis not present

## 2014-11-13 DIAGNOSIS — I10 Essential (primary) hypertension: Secondary | ICD-10-CM | POA: Diagnosis not present

## 2014-11-13 DIAGNOSIS — G4730 Sleep apnea, unspecified: Secondary | ICD-10-CM | POA: Diagnosis not present

## 2014-11-13 DIAGNOSIS — D696 Thrombocytopenia, unspecified: Secondary | ICD-10-CM | POA: Diagnosis not present

## 2014-11-13 DIAGNOSIS — E780 Pure hypercholesterolemia: Secondary | ICD-10-CM | POA: Diagnosis not present

## 2014-11-13 DIAGNOSIS — M109 Gout, unspecified: Secondary | ICD-10-CM | POA: Diagnosis not present

## 2014-11-13 LAB — CBC CANCER CENTER
BASOS ABS: 0.3 x10 3/mm — AB (ref 0.0–0.1)
Basophil %: 0.4 %
Eosinophil #: 0.2 x10 3/mm (ref 0.0–0.7)
Eosinophil %: 0.3 %
HCT: 42.9 % (ref 40.0–52.0)
HGB: 13.3 g/dL (ref 13.0–18.0)
LYMPHS ABS: 60.7 x10 3/mm — AB (ref 1.0–3.6)
Lymphocyte %: 87.8 %
MCH: 29.6 pg (ref 26.0–34.0)
MCHC: 30.9 g/dL — ABNORMAL LOW (ref 32.0–36.0)
MCV: 96 fL (ref 80–100)
MONO ABS: 1.7 x10 3/mm — AB (ref 0.2–1.0)
MONOS PCT: 2.5 %
Neutrophil #: 6.2 x10 3/mm (ref 1.4–6.5)
Neutrophil %: 9 %
Platelet: 118 x10 3/mm — ABNORMAL LOW (ref 150–440)
RBC: 4.48 10*6/uL (ref 4.40–5.90)
RDW: 16.6 % — AB (ref 11.5–14.5)
WBC: 69.2 x10 3/mm — ABNORMAL HIGH (ref 3.8–10.6)

## 2014-11-13 LAB — LACTATE DEHYDROGENASE: LDH: 174 U/L (ref 85–241)

## 2014-11-13 LAB — CREATININE, SERUM
Creatinine: 1.28 mg/dL (ref 0.60–1.30)
EGFR (African American): >60
EGFR (Non-African Amer.): 57 — ABNORMAL LOW

## 2014-11-13 LAB — URIC ACID: URIC ACID: 6.5 mg/dL (ref 3.5–7.2)

## 2014-12-03 ENCOUNTER — Ambulatory Visit: Payer: Self-pay | Admitting: Internal Medicine

## 2014-12-03 DIAGNOSIS — C9510 Chronic leukemia of unspecified cell type not having achieved remission: Secondary | ICD-10-CM | POA: Diagnosis not present

## 2014-12-11 DIAGNOSIS — C9510 Chronic leukemia of unspecified cell type not having achieved remission: Secondary | ICD-10-CM | POA: Diagnosis not present

## 2015-01-01 ENCOUNTER — Ambulatory Visit
Admit: 2015-01-01 | Disposition: A | Discharge status: Home / Self Care | Payer: Self-pay | Attending: Internal Medicine | Admitting: Internal Medicine

## 2015-02-05 ENCOUNTER — Ambulatory Visit
Admit: 2015-02-05 | Disposition: A | Discharge status: Home / Self Care | Payer: Self-pay | Attending: Internal Medicine | Admitting: Internal Medicine

## 2015-02-05 DIAGNOSIS — C9110 Chronic lymphocytic leukemia of B-cell type not having achieved remission: Secondary | ICD-10-CM | POA: Diagnosis not present

## 2015-02-05 DIAGNOSIS — D696 Thrombocytopenia, unspecified: Secondary | ICD-10-CM | POA: Diagnosis not present

## 2015-02-05 DIAGNOSIS — I10 Essential (primary) hypertension: Secondary | ICD-10-CM | POA: Diagnosis not present

## 2015-02-05 DIAGNOSIS — N400 Enlarged prostate without lower urinary tract symptoms: Secondary | ICD-10-CM | POA: Diagnosis not present

## 2015-02-05 DIAGNOSIS — E780 Pure hypercholesterolemia: Secondary | ICD-10-CM | POA: Diagnosis not present

## 2015-02-05 DIAGNOSIS — Z79899 Other long term (current) drug therapy: Secondary | ICD-10-CM | POA: Diagnosis not present

## 2015-02-05 DIAGNOSIS — N320 Bladder-neck obstruction: Secondary | ICD-10-CM | POA: Diagnosis not present

## 2015-02-05 DIAGNOSIS — M109 Gout, unspecified: Secondary | ICD-10-CM | POA: Diagnosis not present

## 2015-02-05 LAB — CREATININE, SERUM
CREATINE, SERUM: 1.14
Creatinine: 1.14 mg/dL
EGFR (African American): >60
EGFR (Non-African Amer.): 60 — ABNORMAL LOW

## 2015-02-05 LAB — CBC CANCER CENTER
Basophil #: 0.5 x10 3/mm — ABNORMAL HIGH (ref 0.0–0.1)
Basophil %: 0.5 %
EOS PCT: 0.4 %
Eosinophil #: 0.4 x10 3/mm (ref 0.0–0.7)
HCT: 46.7 % (ref 40.0–52.0)
HGB: 14.7 g/dL (ref 13.0–18.0)
Lymphocyte #: 91.5 x10 3/mm — ABNORMAL HIGH (ref 1.0–3.6)
Lymphocyte %: 88.7 %
MCH: 29 pg (ref 26.0–34.0)
MCHC: 31.5 g/dL — ABNORMAL LOW (ref 32.0–36.0)
MCV: 92 fL (ref 80–100)
MONOS PCT: 1.8 %
Monocyte #: 1.9 x10 3/mm — ABNORMAL HIGH (ref 0.2–1.0)
NEUTROS ABS: 8.9 x10 3/mm — AB (ref 1.4–6.5)
Neutrophil %: 8.6 %
Platelet: 127 x10 3/mm — ABNORMAL LOW (ref 150–440)
RBC: 5.08 10*6/uL (ref 4.40–5.90)
RDW: 15.1 % — AB (ref 11.5–14.5)
WBC: 103.1 x10 3/mm — AB (ref 3.8–10.6)

## 2015-02-05 LAB — LACTATE DEHYDROGENASE: LDH: 137 U/L

## 2015-02-05 LAB — URIC ACID: URIC ACID: 5.2 mg/dL

## 2015-03-18 ENCOUNTER — Other Ambulatory Visit: Payer: Self-pay | Admitting: *Deleted

## 2015-03-18 DIAGNOSIS — C9110 Chronic lymphocytic leukemia of B-cell type not having achieved remission: Secondary | ICD-10-CM

## 2015-03-19 ENCOUNTER — Inpatient Hospital Stay: Payer: Medicare Other | Attending: Internal Medicine

## 2015-03-19 DIAGNOSIS — C9510 Chronic leukemia of unspecified cell type not having achieved remission: Secondary | ICD-10-CM | POA: Diagnosis not present

## 2015-03-19 DIAGNOSIS — C9110 Chronic lymphocytic leukemia of B-cell type not having achieved remission: Secondary | ICD-10-CM

## 2015-03-19 LAB — CBC WITH DIFFERENTIAL/PLATELET
Basophils Absolute: 0.2 10*3/uL — ABNORMAL HIGH (ref 0–0.1)
Basophils Relative: 0 %
Eosinophils Absolute: 0.3 10*3/uL (ref 0–0.7)
HEMATOCRIT: 47.6 % (ref 40.0–52.0)
HEMOGLOBIN: 14.9 g/dL (ref 13.0–18.0)
Lymphocytes Relative: 93 %
Lymphs Abs: 93.6 10*3/uL — ABNORMAL HIGH (ref 1.0–3.6)
MCH: 29.4 pg (ref 26.0–34.0)
MCHC: 31.4 g/dL — ABNORMAL LOW (ref 32.0–36.0)
MCV: 93.8 fL (ref 80.0–100.0)
MONO ABS: 0.7 10*3/uL (ref 0.2–1.0)
Monocytes Relative: 1 %
Neutro Abs: 6 10*3/uL (ref 1.4–6.5)
Neutrophils Relative %: 6 %
Platelets: 118 10*3/uL — ABNORMAL LOW (ref 150–440)
RBC: 5.07 MIL/uL (ref 4.40–5.90)
RDW: 15.8 % — ABNORMAL HIGH (ref 11.5–14.5)
WBC: 100.8 10*3/uL — AB (ref 3.8–10.6)

## 2015-03-19 LAB — LACTATE DEHYDROGENASE: LDH: 141 U/L (ref 98–192)

## 2015-03-19 LAB — URIC ACID: Uric Acid, Serum: 5.4 mg/dL (ref 4.4–7.6)

## 2015-03-19 LAB — CREATININE, SERUM
Creatinine, Ser: 1.21 mg/dL (ref 0.61–1.24)
GFR calc non Af Amer: 54 mL/min — ABNORMAL LOW (ref >60)

## 2015-04-30 ENCOUNTER — Inpatient Hospital Stay (HOSPITAL_BASED_OUTPATIENT_CLINIC_OR_DEPARTMENT_OTHER): Payer: Medicare Other | Admitting: Family Medicine

## 2015-04-30 ENCOUNTER — Encounter: Payer: Self-pay | Admitting: Family Medicine

## 2015-04-30 ENCOUNTER — Other Ambulatory Visit: Payer: Self-pay | Admitting: Family Medicine

## 2015-04-30 ENCOUNTER — Inpatient Hospital Stay: Payer: Medicare Other | Attending: Family Medicine

## 2015-04-30 VITALS — BP 154/74 | HR 48 | Temp 96.3°F | Resp 19 | Ht 69.0 in | Wt 241.8 lb

## 2015-04-30 DIAGNOSIS — I10 Essential (primary) hypertension: Secondary | ICD-10-CM | POA: Insufficient documentation

## 2015-04-30 DIAGNOSIS — G4730 Sleep apnea, unspecified: Secondary | ICD-10-CM | POA: Diagnosis not present

## 2015-04-30 DIAGNOSIS — Z79899 Other long term (current) drug therapy: Secondary | ICD-10-CM

## 2015-04-30 DIAGNOSIS — E780 Pure hypercholesterolemia: Secondary | ICD-10-CM

## 2015-04-30 DIAGNOSIS — Z8042 Family history of malignant neoplasm of prostate: Secondary | ICD-10-CM | POA: Diagnosis not present

## 2015-04-30 DIAGNOSIS — C9510 Chronic leukemia of unspecified cell type not having achieved remission: Secondary | ICD-10-CM

## 2015-04-30 DIAGNOSIS — C9110 Chronic lymphocytic leukemia of B-cell type not having achieved remission: Secondary | ICD-10-CM

## 2015-04-30 DIAGNOSIS — M109 Gout, unspecified: Secondary | ICD-10-CM | POA: Insufficient documentation

## 2015-04-30 DIAGNOSIS — Z801 Family history of malignant neoplasm of trachea, bronchus and lung: Secondary | ICD-10-CM | POA: Insufficient documentation

## 2015-04-30 LAB — CBC WITH DIFFERENTIAL/PLATELET
Basophils Absolute: 0.4 10*3/uL — ABNORMAL HIGH (ref 0–0.1)
EOS ABS: 0.3 10*3/uL (ref 0–0.7)
Eosinophils Relative: 0 %
HEMATOCRIT: 47.9 % (ref 40.0–52.0)
HEMOGLOBIN: 15.1 g/dL (ref 13.0–18.0)
Lymphocytes Relative: 93 %
Lymphs Abs: 105.5 10*3/uL — ABNORMAL HIGH (ref 1.0–3.6)
MCH: 29.9 pg (ref 26.0–34.0)
MCHC: 31.6 g/dL — AB (ref 32.0–36.0)
MCV: 94.7 fL (ref 80.0–100.0)
MONO ABS: 1.2 10*3/uL — AB (ref 0.2–1.0)
Neutro Abs: 6.7 10*3/uL — ABNORMAL HIGH (ref 1.4–6.5)
Neutrophils Relative %: 6 %
Platelets: 129 10*3/uL — ABNORMAL LOW (ref 150–440)
RBC: 5.05 MIL/uL (ref 4.40–5.90)
RDW: 15.4 % — ABNORMAL HIGH (ref 11.5–14.5)
WBC: 114 10*3/uL (ref 3.8–10.6)

## 2015-04-30 LAB — COMPREHENSIVE METABOLIC PANEL
ALBUMIN: 4 g/dL (ref 3.5–5.0)
ALK PHOS: 45 U/L (ref 38–126)
ALT: 16 U/L — ABNORMAL LOW (ref 17–63)
ANION GAP: 10 (ref 5–15)
AST: 20 U/L (ref 15–41)
BUN: 23 mg/dL — ABNORMAL HIGH (ref 6–20)
CO2: 26 mmol/L (ref 22–32)
CREATININE: 1.44 mg/dL — AB (ref 0.61–1.24)
Calcium: 9.2 mg/dL (ref 8.9–10.3)
Chloride: 102 mmol/L (ref 101–111)
GFR calc non Af Amer: 44 mL/min — ABNORMAL LOW (ref >60)
GFR, EST AFRICAN AMERICAN: 51 mL/min — AB (ref >60)
Glucose, Bld: 103 mg/dL — ABNORMAL HIGH (ref 65–99)
Potassium: 3.5 mmol/L (ref 3.5–5.1)
Sodium: 138 mmol/L (ref 135–145)
TOTAL PROTEIN: 6.4 g/dL — AB (ref 6.5–8.1)
Total Bilirubin: 0.7 mg/dL (ref 0.3–1.2)

## 2015-04-30 LAB — LACTATE DEHYDROGENASE: LDH: 142 U/L (ref 98–192)

## 2015-04-30 LAB — URIC ACID: Uric Acid, Serum: 6.4 mg/dL (ref 4.4–7.6)

## 2015-04-30 NOTE — Progress Notes (Signed)
[place]  [phone]  [phone]) [phone]     Brent Hoover DOB: 08/12/1930  MR#: [id number]  OVZ#:[id number]  Patient Care Team: Dallas Schimke, MD as PCP - General (Internal Medicine)  CHIEF COMPLAINT:  Chief Complaint  Patient presents with  . Follow-up    CLL  Chief Complaint/Problem List:  Lambda positive B-cell CLL, RAI stage 0, lymphocytosis only. 10/08/06 CT of chest, abdomen and pelvis - No evidence of splenomegaly. No evidence of lymphadenopathy in the chest, abdomen, or pelvis. 10/04/06 flow cytometry of peripheral blood - A monoclonal population of lambda positive B-cells was detected. Phenotype consistent with diagnosis of B cell chronic lymphocytic leukemia/small lymphocytic lymphoma. CD38 positive. 03/06/08 CT Chest abdomen Pelvis:No findings to suggest recurrent disease. No adenopathy. 06/04/09: CT chest/abdomen/pelvis: No evidence of lymphadenopathy/splenomegaly. 06/04/10: ct chest/abdomen/pelvis: no evidence of active malignancy within the thorax or  abdomen or pelvis.   INTERVAL HISTORY:  Patient is here for continued follow-up for CLL area he was last seen by Dr. Inez Pilgrim in April 2016. He reports overall feeling very well. He denies any enlarging lymph nodes, abdominal pain, cough, fever, night sweats.  REVIEW OF SYSTEMS:   Review of Systems  Constitutional: Negative for fever, chills, weight loss, malaise/fatigue and diaphoresis.  HENT: Negative for congestion, ear discharge, ear pain, hearing loss, nosebleeds, sore throat and tinnitus.   Eyes: Negative for blurred vision, double vision, photophobia, pain, discharge and redness.  Respiratory: Negative for cough, hemoptysis, sputum production, shortness of breath, wheezing and stridor.   Cardiovascular: Negative for chest pain, palpitations, orthopnea, claudication, leg swelling and PND.  Gastrointestinal: Negative for heartburn, nausea, vomiting, abdominal pain, diarrhea,  constipation, blood in stool and melena.  Genitourinary: Negative.   Musculoskeletal: Negative.   Skin: Negative.   Neurological: Negative for dizziness, tingling, focal weakness, seizures, weakness and headaches.  Endo/Heme/Allergies: Does not bruise/bleed easily.  Psychiatric/Behavioral: Negative for depression. The patient is not nervous/anxious and does not have insomnia.     As per HPI. Otherwise, a complete review of systems is negatve.  ONCOLOGY HISTORY:   CLL (chronic lymphocytic leukemia)   04/30/2015 Initial Diagnosis CLL (chronic lymphocytic leukemia)    PAST MEDICAL HISTORY: Past Medical History  Diagnosis Date  . Chronic lymphocytic leukemia (CLL), B-cell     Lambda positive B-cell CLL  . Hypertension   . Hypercholesterolemia   . Gout   . Bladder neck obstruction   . Cataracts, bilateral   . Sleep apnea   . Lymphocytosis   . Thrombocytopenia   . CLL (chronic lymphocytic leukemia) 04/30/2015    PAST SURGICAL HISTORY: Past Surgical History  Procedure Laterality Date  . Prostate surgery  2008    FAMILY HISTORY Family History  Problem Relation Age of Onset  . Brain cancer Son 74    pt's only son  . Lung cancer Mother 28    deceased  . Polycythemia Brother     half brother  . Prostate cancer Brother     GYNECOLOGIC HISTORY:  No LMP for male patient.     ADVANCED DIRECTIVES:    HEALTH MAINTENANCE: History  Substance Use Topics  . Smoking status: Never Smoker   . Smokeless tobacco: Not on file  . Alcohol Use: No     Colonoscopy:  PAP:  Bone density:  Lipid panel:  Allergies  Allergen Reactions  . Penicillins Other (See Comments)    unknown    Current Outpatient Prescriptions  Medication Sig Dispense Refill  . allopurinol (ZYLOPRIM)  100 MG tablet Take 100 mg by mouth 2 (two) times daily.    Marland Kitchen amLODipine (NORVASC) 10 MG tablet Take 5 mg by mouth daily. Patient takes a half of the 10 mg tablet to equal 5 mg every day    . lisinopril  (PRINIVIL,ZESTRIL) 10 MG tablet Take 10 mg by mouth daily.     No current facility-administered medications for this visit.    OBJECTIVE: BP 154/74 mmHg  Pulse 48  Temp(Src) 96.3 F (35.7 C) (Oral)  Resp 19  Ht $R'5\' 9"'St$  (1.753 m)  Wt 241 lb 13.5 oz (109.7 kg)  BMI 35.70 kg/m2   Body mass index is 35.7 kg/(m^2).    ECOG FS:0 - Asymptomatic  General: Well-developed, well-nourished, no acute distress. Eyes: Pink conjunctiva, anicteric sclera. HEENT: Normocephalic, moist mucous membranes, clear oropharnyx. Lungs: Clear to auscultation bilaterally. Heart: Regular rate and rhythm. No rubs, murmurs, or gallops. Abdomen: Soft, nontender, nondistended. No organomegaly noted, normoactive bowel sounds. Musculoskeletal: No edema, cyanosis, or clubbing. Neuro: Alert, answering all questions appropriately. Cranial nerves grossly intact. Skin: No rashes or petechiae noted. Psych: Normal affect. Lymphatics: No cervical, calvicular, axillary or inguinal LAD.   LAB RESULTS:  Appointment on 04/30/2015  Component Date Value Ref Range Status  . WBC 04/30/2015 114.0* 3.8 - 10.6 K/uL Final   [facility] CRITICAL VALUE PROTOCOL  . RBC 04/30/2015 5.05  4.40 - 5.90 MIL/uL Final  . Hemoglobin 04/30/2015 15.1  13.0 - 18.0 g/dL Final  . HCT 04/30/2015 47.9  40.0 - 52.0 % Final  . MCV 04/30/2015 94.7  80.0 - 100.0 fL Final  . MCH 04/30/2015 29.9  26.0 - 34.0 pg Final  . MCHC 04/30/2015 31.6* 32.0 - 36.0 g/dL Final  . RDW 04/30/2015 15.4* 11.5 - 14.5 % Final  . Platelets 04/30/2015 129* 150 - 440 K/uL Final  . Neutrophils Relative % 04/30/2015 6%   Final  . Neutro Abs 04/30/2015 6.7* 1.4 - 6.5 K/uL Final  . Lymphocytes Relative 04/30/2015 93%   Final  . Lymphs Abs 04/30/2015 105.5* 1.0 - 3.6 K/uL Final  . Monocytes Relative 04/30/2015 1%   Final  . Monocytes Absolute 04/30/2015 1.2* 0.2 - 1.0 K/uL Final  . Eosinophils Relative 04/30/2015 0%   Final  . Eosinophils Absolute 04/30/2015 0.3  0 - 0.7  K/uL Final  . Basophils Relative 04/30/2015 0%   Final  . Basophils Absolute 04/30/2015 0.4* 0 - 0.1 K/uL Final  . Sodium 04/30/2015 138  135 - 145 mmol/L Final  . Potassium 04/30/2015 3.5  3.5 - 5.1 mmol/L Final  . Chloride 04/30/2015 102  101 - 111 mmol/L Final  . CO2 04/30/2015 26  22 - 32 mmol/L Final  . Glucose, Bld 04/30/2015 103* 65 - 99 mg/dL Final  . BUN 04/30/2015 23* 6 - 20 mg/dL Final  . Creatinine, Ser 04/30/2015 1.44* 0.61 - 1.24 mg/dL Final  . Calcium 04/30/2015 9.2  8.9 - 10.3 mg/dL Final  . Total Protein 04/30/2015 6.4* 6.5 - 8.1 g/dL Final  . Albumin 04/30/2015 4.0  3.5 - 5.0 g/dL Final  . AST 04/30/2015 20  15 - 41 U/L Final  . ALT 04/30/2015 16* 17 - 63 U/L Final  . Alkaline Phosphatase 04/30/2015 45  38 - 126 U/L Final  . Total Bilirubin 04/30/2015 0.7  0.3 - 1.2 mg/dL Final  . GFR calc non Af Amer 04/30/2015 44* >60 mL/min Final  . GFR calc Af Amer 04/30/2015 51* >60 mL/min Final   Comment: (NOTE)  The eGFR has been calculated using the CKD EPI equation. This calculation has not been validated in all clinical situations. eGFR's persistently <60 mL/min signify possible Chronic Kidney Disease.   . Anion gap 04/30/2015 10  5 - 15 Final  . Uric Acid, Serum 04/30/2015 6.4  4.4 - 7.6 mg/dL Final  . LDH 04/30/2015 142  98 - 192 U/L Final    STUDIES: No results found.  ASSESSMENT:  CLL  PLAN:   1. CLL, B-cell in nature. CBC appears overall stable with only lymphocytosis and mild thrombocytopenia. Overall he is clinically stable. No splenomegaly, no palpable lymph nodes. We'll have patient return in 6 weeks for continued lab work. In 12 weeks patient will have ultrasound of abdomen for further evaluation of liver, spleen, lymph nodes as well as lab work in to see provider.  Patient expressed understanding and was in agreement with this plan. He also understands that He can call clinic at any time with any questions, concerns, or complaints.   Dr. Oliva Bustard was  available for consultation and review of plan of care for this patient.   Evlyn Kanner, NP   05/13/2015 12:19 PM

## 2015-06-11 ENCOUNTER — Other Ambulatory Visit: Payer: Medicare Other

## 2015-06-13 ENCOUNTER — Inpatient Hospital Stay: Payer: Medicare Other | Attending: Internal Medicine

## 2015-06-13 DIAGNOSIS — C9510 Chronic leukemia of unspecified cell type not having achieved remission: Secondary | ICD-10-CM | POA: Insufficient documentation

## 2015-06-13 DIAGNOSIS — C9110 Chronic lymphocytic leukemia of B-cell type not having achieved remission: Secondary | ICD-10-CM

## 2015-06-13 LAB — CBC WITH DIFFERENTIAL/PLATELET
Basophils Absolute: 0.4 10*3/uL — ABNORMAL HIGH (ref 0–0.1)
EOS ABS: 0.4 10*3/uL (ref 0–0.7)
Eosinophils Relative: 0 %
HCT: 47.8 % (ref 40.0–52.0)
Hemoglobin: 15 g/dL (ref 13.0–18.0)
Lymphocytes Relative: 93 %
Lymphs Abs: 108.7 10*3/uL — ABNORMAL HIGH (ref 1.0–3.6)
MCH: 29.7 pg (ref 26.0–34.0)
MCHC: 31.4 g/dL — AB (ref 32.0–36.0)
MCV: 94.6 fL (ref 80.0–100.0)
Monocytes Absolute: 1.5 10*3/uL — ABNORMAL HIGH (ref 0.2–1.0)
Monocytes Relative: 1 %
Neutro Abs: 7.2 10*3/uL — ABNORMAL HIGH (ref 1.4–6.5)
Neutrophils Relative %: 6 %
PLATELETS: 135 10*3/uL — AB (ref 150–440)
RBC: 5.05 MIL/uL (ref 4.40–5.90)
RDW: 14.8 % — ABNORMAL HIGH (ref 11.5–14.5)
WBC: 118.2 10*3/uL (ref 3.8–10.6)

## 2015-06-14 ENCOUNTER — Other Ambulatory Visit: Payer: Medicare Other

## 2015-07-16 ENCOUNTER — Ambulatory Visit
Admission: RE | Admit: 2015-07-16 | Discharge: 2015-07-16 | Disposition: A | Discharge status: Home / Self Care | Payer: Medicare Other | Source: Ambulatory Visit | Attending: Family Medicine | Admitting: Family Medicine

## 2015-07-16 DIAGNOSIS — C9110 Chronic lymphocytic leukemia of B-cell type not having achieved remission: Secondary | ICD-10-CM | POA: Diagnosis not present

## 2015-07-16 DIAGNOSIS — R161 Splenomegaly, not elsewhere classified: Secondary | ICD-10-CM | POA: Diagnosis not present

## 2015-07-16 DIAGNOSIS — C9590 Leukemia, unspecified not having achieved remission: Secondary | ICD-10-CM | POA: Diagnosis not present

## 2015-07-16 DIAGNOSIS — N281 Cyst of kidney, acquired: Secondary | ICD-10-CM | POA: Diagnosis not present

## 2015-07-23 ENCOUNTER — Ambulatory Visit: Payer: Medicare Other

## 2015-07-23 ENCOUNTER — Other Ambulatory Visit: Payer: Medicare Other

## 2015-07-30 ENCOUNTER — Ambulatory Visit: Payer: Medicare Other | Admitting: Internal Medicine

## 2015-07-30 ENCOUNTER — Other Ambulatory Visit: Payer: Medicare Other

## 2015-08-06 ENCOUNTER — Telehealth: Payer: Self-pay | Admitting: Gastroenterology

## 2015-08-06 ENCOUNTER — Encounter: Payer: Self-pay | Admitting: *Deleted

## 2015-08-06 ENCOUNTER — Inpatient Hospital Stay (HOSPITAL_BASED_OUTPATIENT_CLINIC_OR_DEPARTMENT_OTHER): Payer: Medicare Other | Admitting: Internal Medicine

## 2015-08-06 ENCOUNTER — Inpatient Hospital Stay: Payer: Medicare Other | Attending: Internal Medicine

## 2015-08-06 VITALS — BP 146/79 | HR 71 | Temp 96.6°F | Resp 18 | Ht 69.0 in | Wt 244.7 lb

## 2015-08-06 DIAGNOSIS — C9110 Chronic lymphocytic leukemia of B-cell type not having achieved remission: Secondary | ICD-10-CM | POA: Diagnosis not present

## 2015-08-06 DIAGNOSIS — Z801 Family history of malignant neoplasm of trachea, bronchus and lung: Secondary | ICD-10-CM | POA: Diagnosis not present

## 2015-08-06 DIAGNOSIS — M109 Gout, unspecified: Secondary | ICD-10-CM | POA: Insufficient documentation

## 2015-08-06 DIAGNOSIS — Z808 Family history of malignant neoplasm of other organs or systems: Secondary | ICD-10-CM | POA: Insufficient documentation

## 2015-08-06 DIAGNOSIS — R161 Splenomegaly, not elsewhere classified: Secondary | ICD-10-CM | POA: Diagnosis not present

## 2015-08-06 DIAGNOSIS — Z8042 Family history of malignant neoplasm of prostate: Secondary | ICD-10-CM | POA: Diagnosis not present

## 2015-08-06 DIAGNOSIS — E7800 Pure hypercholesterolemia, unspecified: Secondary | ICD-10-CM | POA: Insufficient documentation

## 2015-08-06 DIAGNOSIS — G4730 Sleep apnea, unspecified: Secondary | ICD-10-CM | POA: Insufficient documentation

## 2015-08-06 DIAGNOSIS — Z79899 Other long term (current) drug therapy: Secondary | ICD-10-CM | POA: Diagnosis not present

## 2015-08-06 DIAGNOSIS — Z23 Encounter for immunization: Secondary | ICD-10-CM

## 2015-08-06 DIAGNOSIS — I10 Essential (primary) hypertension: Secondary | ICD-10-CM

## 2015-08-06 MED ORDER — INFLUENZA VAC SPLIT QUAD 0.5 ML IM SUSY: 0.5000 mL | PREFILLED_SYRINGE | Freq: Once | INTRAMUSCULAR | Status: DC

## 2015-08-06 NOTE — Progress Notes (Signed)
[facility] OFFICE PROGRESS NOTE   SUMMARY OF ONCOLOGIC HISTORY:  # 2007- CLL [by Flow]; RAI STAGE I; [place]- SEP 2016- Splenomegaly [15cm/1400cc];   # COLONOSCOPYFestus Aloe ? 2011 ]    INTERVAL HISTORY:  This is my first interaction with the patient since I joined the practice September 2016. I reviewed the patient's prior chart/pertinent labs/imaging in detail; findings are summarized.  A very pleasant 81 year old male patient with above history of CLL currently monitored is here for follow-up.  Patient denies any unusual weight loss; denies any night sweats or extreme fatigue recurrent infections or fevers.  His appetite is good. Denies any blood in stools or black stools.  REVIEW OF SYSTEMS:  A complete 10 point review of system is done which is negative except mentioned above/history of present illness.   PAST MEDICAL HISTORY :  Past Medical History  Diagnosis Date  . Chronic lymphocytic leukemia (CLL), B-cell (HCC)     Lambda positive B-cell CLL  . Hypertension   . Hypercholesterolemia   . Gout   . Bladder neck obstruction   . Cataracts, bilateral   . Sleep apnea   . Lymphocytosis   . Thrombocytopenia ([place])     PAST SURGICAL HISTORY :   Past Surgical History  Procedure Laterality Date  . Prostate surgery  2008    FAMILY HISTORY :   Family History  Problem Relation Age of Onset  . Brain cancer Son 34    pt's only son  . Lung cancer Mother 30    deceased  . Polycythemia Brother     half brother  . Prostate cancer Brother     SOCIAL HISTORY:   Social History  Substance Use Topics  . Smoking status: Never Smoker   . Smokeless tobacco: Not on file  . Alcohol Use: No    ALLERGIES:  is allergic to penicillins.  MEDICATIONS:  Current Outpatient Prescriptions  Medication Sig Dispense Refill  . allopurinol (ZYLOPRIM) 100 MG tablet Take 100 mg by mouth 2 (two) times daily.    Marland Kitchen amLODipine (NORVASC) 10 MG tablet Take 5 mg by mouth daily. Patient  takes a half of the 10 mg tablet to equal 5 mg every day    . lisinopril-hydrochlorothiazide (PRINZIDE,ZESTORETIC) 20-12.5 MG tablet Take 1 tablet by mouth daily.    Marland Kitchen lisinopril (PRINIVIL,ZESTRIL) 10 MG tablet Take 10 mg by mouth daily.     Current Facility-Administered Medications  Medication Dose Route Frequency Provider Last Rate Last Dose  . Influenza vac split quadrivalent PF (FLUARIX) injection 0.5 mL  0.5 mL Intramuscular Once Cammie Sickle, MD        PHYSICAL EXAMINATION: ECOG PERFORMANCE STATUS: 0 - Asymptomatic  BP 146/79 mmHg  Pulse 71  Temp(Src) 96.6 F (35.9 C) (Tympanic)  Resp 18  Ht 5\' 9"  (1.753 m)  Wt 244 lb 11.4 oz (111.001 kg)  BMI 36.12 kg/m2  Filed Weights   08/06/15 0912  Weight: 244 lb 11.4 oz (111.001 kg)    GENERAL: Well-nourished well-developed; Alert, no distress and comfortable.   He is alone. EYES: no pallor or icterus OROPHARYNX: no thrush or ulceration; good dentition  NECK: supple, no masses felt LYMPH:  no palpable lymphadenopathy in the cervical, axillary or inguinal regions LUNGS: clear to auscultation and  No wheeze or crackles HEART/CVS: regular rate & rhythm and no murmurs; No lower extremity edema ABDOMEN:abdomen soft, non-tender and normal bowel sounds Musculoskeletal:no cyanosis of digits and no clubbing  PSYCH: alert & oriented  x 3 with fluent speech NEURO: no focal motor/sensory deficits SKIN:  no rashes or significant lesions  LABORATORY DATA:  I have reviewed the data as listed    Component Value Date/Time   NA 138 04/30/2015 0907   NA 137 09/14/2014 0834   K 3.5 04/30/2015 0907   K 4.0 09/14/2014 0834   CL 102 04/30/2015 0907   CL 101 09/14/2014 0834   CO2 26 04/30/2015 0907   CO2 27 09/14/2014 0834   GLUCOSE 103* 04/30/2015 0907   GLUCOSE 123* 09/14/2014 0834   BUN 23* 04/30/2015 0907   BUN 14 09/14/2014 0834   CREATININE 1.44* 04/30/2015 0907   CREATININE 1.14 02/05/2015 0949   CALCIUM 9.2 04/30/2015 0907    CALCIUM 8.8 09/14/2014 0834   PROT 6.4* 04/30/2015 0907   PROT 6.7 09/14/2014 0834   ALBUMIN 4.0 04/30/2015 0907   ALBUMIN 3.6 09/14/2014 0834   AST 20 04/30/2015 0907   AST 21 09/14/2014 0834   ALT 16* 04/30/2015 0907   ALT 26 09/14/2014 0834   ALKPHOS 45 04/30/2015 0907   ALKPHOS 49 09/14/2014 0834   BILITOT 0.7 04/30/2015 0907   BILITOT 1.1* 09/14/2014 0834   GFRNONAA 44* 04/30/2015 0907   GFRNONAA 60* 02/05/2015 0949   GFRNONAA 57* 11/13/2014 0933   GFRAA 51* 04/30/2015 0907   GFRAA >60 02/05/2015 0949   GFRAA >60 11/13/2014 0933    No results found for: SPEP, UPEP  Lab Results  Component Value Date   WBC 131.2* 08/06/2015   NEUTROABS 6.7* 08/06/2015   HGB 15.7 08/06/2015   HCT 50.0 08/06/2015   MCV 95.2 08/06/2015   PLT 133* 08/06/2015      Chemistry      Component Value Date/Time   NA 138 04/30/2015 0907   NA 137 09/14/2014 0834   K 3.5 04/30/2015 0907   K 4.0 09/14/2014 0834   CL 102 04/30/2015 0907   CL 101 09/14/2014 0834   CO2 26 04/30/2015 0907   CO2 27 09/14/2014 0834   BUN 23* 04/30/2015 0907   BUN 14 09/14/2014 0834   CREATININE 1.44* 04/30/2015 0907   CREATININE 1.14 02/05/2015 0949      Component Value Date/Time   CALCIUM 9.2 04/30/2015 0907   CALCIUM 8.8 09/14/2014 0834   ALKPHOS 45 04/30/2015 0907   ALKPHOS 49 09/14/2014 0834   AST 20 04/30/2015 0907   AST 21 09/14/2014 0834   ALT 16* 04/30/2015 0907   ALT 26 09/14/2014 0834   BILITOT 0.7 04/30/2015 0907   BILITOT 1.1* 09/14/2014 0834       RADIOGRAPHIC STUDIES: I have personally reviewed the radiological images as listed and agreed with the findings in the report. No results found.   ASSESSMENT & PLAN:   # CLL Rai Stage I- patient is currently asymptomatic; we will continue to monitor for now. Patient was again educated regarding the symptoms associated with symptomatic CLL at length. Today patient's white count is 133; slightly up from 3 months ago at 118; however hemoglobin  is stable at 15; Platelets are stable at 130s.  I would recommend checking fish panel; quantitative immunoglobulins; CBC LDH and CMP; hepatitis B at next visit. As I suspect that patient will soon start new treatment. I also went over the new treatment options available for treatment of CLL with the patient. Again as patient is asymptomatic; withhold any treatment at this time.  # Screening colonoscopy- ; and as per the patient is due for colonoscopy through [facility].  I  recommend screening colonoscopy as patient's with CLL are at risk for secondary malignancies and exceptionally good performance status at 63    Orders Placed This Encounter  Procedures  . CBC with Differential    Standing Status: Future     Number of Occurrences:      Standing Expiration Date: 08/05/2016  . Comprehensive metabolic panel    Standing Status: Future     Number of Occurrences:      Standing Expiration Date: 08/05/2016  . Lactate dehydrogenase    Standing Status: Future     Number of Occurrences:      Standing Expiration Date: 08/05/2016   All questions were answered. The patient knows to call the clinic with any problems, questions or concerns. No barriers to learning was detected. I spent 25 minutes counseling the patient face to face. The total time spent in the appointment was 30 minutes and more than 50% was on counseling and review of test results     Cammie Sickle, MD 08/06/2015 9:55 AM

## 2015-08-06 NOTE — Telephone Encounter (Signed)
Colonoscopy triage °

## 2015-08-07 LAB — CBC WITH DIFFERENTIAL/PLATELET
BASOS ABS: 0.2 10*3/uL — AB (ref 0–0.1)
Basophils Relative: 0 %
Eosinophils Absolute: 0.5 10*3/uL (ref 0–0.7)
HCT: 50 % (ref 40.0–52.0)
Hemoglobin: 15.7 g/dL (ref 13.0–18.0)
LYMPHS ABS: 121.9 10*3/uL — AB (ref 1.0–3.6)
Lymphocytes Relative: 93 %
MCH: 29.9 pg (ref 26.0–34.0)
MCHC: 31.4 g/dL — ABNORMAL LOW (ref 32.0–36.0)
MCV: 95.2 fL (ref 80.0–100.0)
Monocytes Absolute: 1.9 10*3/uL — ABNORMAL HIGH (ref 0.2–1.0)
Monocytes Relative: 2 %
Neutro Abs: 6.7 10*3/uL — ABNORMAL HIGH (ref 1.4–6.5)
Neutrophils Relative %: 5 %
PLATELETS: 133 10*3/uL — AB (ref 150–440)
RBC: 5.25 MIL/uL (ref 4.40–5.90)
RDW: 15.4 % — ABNORMAL HIGH (ref 11.5–14.5)
WBC: 131.2 10*3/uL (ref 3.8–10.6)

## 2015-08-08 ENCOUNTER — Other Ambulatory Visit: Payer: Self-pay

## 2015-08-08 NOTE — Telephone Encounter (Signed)
09-10-2015 [place] Colonoscopy needs to be done per Cancer Doctor, medicare/[employer]

## 2015-08-12 ENCOUNTER — Other Ambulatory Visit: Payer: Self-pay | Admitting: Family Medicine

## 2015-09-10 ENCOUNTER — Encounter
Admission: RE | Disposition: A | Discharge status: Home / Self Care | Payer: Self-pay | Source: Ambulatory Visit | Attending: Gastroenterology

## 2015-09-10 ENCOUNTER — Encounter: Payer: Self-pay | Admitting: Anesthesiology

## 2015-09-10 ENCOUNTER — Ambulatory Visit: Payer: Medicare Other | Admitting: Anesthesiology

## 2015-09-10 ENCOUNTER — Ambulatory Visit
Admission: RE | Admit: 2015-09-10 | Discharge: 2015-09-10 | Disposition: A | Discharge status: Home / Self Care | Payer: Medicare Other | Source: Ambulatory Visit | Attending: Gastroenterology | Admitting: Gastroenterology

## 2015-09-10 DIAGNOSIS — Z8042 Family history of malignant neoplasm of prostate: Secondary | ICD-10-CM | POA: Diagnosis not present

## 2015-09-10 DIAGNOSIS — D72820 Lymphocytosis (symptomatic): Secondary | ICD-10-CM | POA: Diagnosis not present

## 2015-09-10 DIAGNOSIS — N320 Bladder-neck obstruction: Secondary | ICD-10-CM | POA: Diagnosis not present

## 2015-09-10 DIAGNOSIS — Z86010 Personal history of colonic polyps: Secondary | ICD-10-CM | POA: Diagnosis not present

## 2015-09-10 DIAGNOSIS — Z808 Family history of malignant neoplasm of other organs or systems: Secondary | ICD-10-CM | POA: Diagnosis not present

## 2015-09-10 DIAGNOSIS — I10 Essential (primary) hypertension: Secondary | ICD-10-CM | POA: Insufficient documentation

## 2015-09-10 DIAGNOSIS — E669 Obesity, unspecified: Secondary | ICD-10-CM | POA: Diagnosis not present

## 2015-09-10 DIAGNOSIS — K641 Second degree hemorrhoids: Secondary | ICD-10-CM | POA: Diagnosis not present

## 2015-09-10 DIAGNOSIS — K648 Other hemorrhoids: Secondary | ICD-10-CM | POA: Diagnosis not present

## 2015-09-10 DIAGNOSIS — Z880 Allergy status to penicillin: Secondary | ICD-10-CM | POA: Diagnosis not present

## 2015-09-10 DIAGNOSIS — H269 Unspecified cataract: Secondary | ICD-10-CM | POA: Insufficient documentation

## 2015-09-10 DIAGNOSIS — E7800 Pure hypercholesterolemia, unspecified: Secondary | ICD-10-CM | POA: Diagnosis not present

## 2015-09-10 DIAGNOSIS — C9110 Chronic lymphocytic leukemia of B-cell type not having achieved remission: Secondary | ICD-10-CM | POA: Diagnosis not present

## 2015-09-10 DIAGNOSIS — Z801 Family history of malignant neoplasm of trachea, bronchus and lung: Secondary | ICD-10-CM | POA: Insufficient documentation

## 2015-09-10 DIAGNOSIS — G4730 Sleep apnea, unspecified: Secondary | ICD-10-CM | POA: Insufficient documentation

## 2015-09-10 DIAGNOSIS — M109 Gout, unspecified: Secondary | ICD-10-CM | POA: Diagnosis not present

## 2015-09-10 DIAGNOSIS — K5790 Diverticulosis of intestine, part unspecified, without perforation or abscess without bleeding: Secondary | ICD-10-CM | POA: Diagnosis not present

## 2015-09-10 DIAGNOSIS — K635 Polyp of colon: Secondary | ICD-10-CM | POA: Diagnosis not present

## 2015-09-10 DIAGNOSIS — D123 Benign neoplasm of transverse colon: Secondary | ICD-10-CM | POA: Insufficient documentation

## 2015-09-10 DIAGNOSIS — D125 Benign neoplasm of sigmoid colon: Secondary | ICD-10-CM | POA: Diagnosis not present

## 2015-09-10 DIAGNOSIS — K5730 Diverticulosis of large intestine without perforation or abscess without bleeding: Secondary | ICD-10-CM | POA: Diagnosis not present

## 2015-09-10 DIAGNOSIS — Z6835 Body mass index (BMI) 35.0-35.9, adult: Secondary | ICD-10-CM | POA: Insufficient documentation

## 2015-09-10 DIAGNOSIS — Z832 Family history of diseases of the blood and blood-forming organs and certain disorders involving the immune mechanism: Secondary | ICD-10-CM | POA: Insufficient documentation

## 2015-09-10 DIAGNOSIS — D696 Thrombocytopenia, unspecified: Secondary | ICD-10-CM | POA: Insufficient documentation

## 2015-09-10 DIAGNOSIS — Z79899 Other long term (current) drug therapy: Secondary | ICD-10-CM | POA: Diagnosis not present

## 2015-09-10 DIAGNOSIS — Z1211 Encounter for screening for malignant neoplasm of colon: Secondary | ICD-10-CM | POA: Diagnosis not present

## 2015-09-10 HISTORY — PX: COLONOSCOPY WITH PROPOFOL: SHX5780

## 2015-09-10 SURGERY — COLONOSCOPY WITH PROPOFOL
Anesthesia: General

## 2015-09-10 MED ORDER — PROPOFOL 500 MG/50ML IV EMUL
INTRAVENOUS | Status: DC | PRN
  Administered 2015-09-10: 120 ug/kg/min via INTRAVENOUS

## 2015-09-10 MED ORDER — SODIUM CHLORIDE 0.9 % IV SOLN
INTRAVENOUS | Status: DC
  Administered 2015-09-10: 1000 mL via INTRAVENOUS

## 2015-09-10 MED ORDER — FENTANYL CITRATE (PF) 100 MCG/2ML IJ SOLN
INTRAMUSCULAR | Status: DC | PRN
Start: 2015-09-10 — End: 2015-09-10
  Administered 2015-09-10: 50 ug via INTRAVENOUS

## 2015-09-10 NOTE — Anesthesia Preprocedure Evaluation (Addendum)
Anesthesia Evaluation  Patient identified by MRN, date of birth, ID band Patient awake    Reviewed: Allergy & Precautions, NPO status , Patient's Chart, lab work & pertinent test results, reviewed documented beta blocker date and time   Airway Mallampati: II  TM Distance: >3 FB     Dental  (+) Chipped   Pulmonary sleep apnea and Continuous Positive Airway Pressure Ventilation ,           Cardiovascular hypertension, Pt. on medications      Neuro/Psych    GI/Hepatic   Endo/Other    Renal/GU      Musculoskeletal   Abdominal   Peds  Hematology   Anesthesia Other Findings Obese.  Reproductive/Obstetrics                            Anesthesia Physical Anesthesia Plan  ASA: III  Anesthesia Plan: General   Post-op Pain Management:    Induction: Intravenous  Airway Management Planned: Nasal Cannula  Additional Equipment:   Intra-op Plan:   Post-operative Plan:   Informed Consent: I have reviewed the patients History and Physical, chart, labs and discussed the procedure including the risks, benefits and alternatives for the proposed anesthesia with the patient or authorized representative who has indicated his/her understanding and acceptance.     Plan Discussed with: CRNA  Anesthesia Plan Comments:         Anesthesia Quick Evaluation

## 2015-09-10 NOTE — Transfer of Care (Signed)
Immediate Anesthesia Transfer of Care Note  Patient: Brent Hoover  Procedure(s) Performed: Procedure(s): COLONOSCOPY WITH PROPOFOL (N/A)  Patient Location: PACU  Anesthesia Type:General  Level of Consciousness: awake and sedated  Airway & Oxygen Therapy: Patient Spontanous Breathing and Patient connected to nasal cannula oxygen  Post-op Assessment: Report given to RN and Post -op Vital signs reviewed and stable  Post vital signs: Reviewed and stable  Last Vitals:  Filed Vitals:   09/10/15 0812  BP: 167/73  Pulse: 54  Temp: 35.8 C  Resp: 16    Complications: No apparent anesthesia complications

## 2015-09-10 NOTE — Op Note (Signed)
[facility] Patient Name: Brent Hoover Procedure Date: 09/10/2015 8:57 AM MRN: [id number] Account #: [id number] Date of Birth: [birth year] Admit Type: Outpatient Age: 81 Room: [facility] ENDO ROOM 4 Gender: Male Note Status: Finalized Procedure:         Colonoscopy Indications:       High risk colon cancer surveillance: Personal history of                     colonic polyps Providers:         Lucilla Lame, MD Medicines:         Propofol per Anesthesia Complications:     No immediate complications. Procedure:         Pre-Anesthesia Assessment:                    - Prior to the procedure, a History and Physical was                     performed, and patient medications and allergies were                     reviewed. The patient's tolerance of previous anesthesia                     was also reviewed. The risks and benefits of the procedure                     and the sedation options and risks were discussed with the                     patient. All questions were answered, and informed consent                     was obtained. Prior Anticoagulants: The patient has taken                     no previous anticoagulant or antiplatelet agents. ASA                     Grade Assessment: II - A patient with mild systemic                     disease. After reviewing the risks and benefits, the                     patient was deemed in satisfactory condition to undergo                     the procedure.                    After obtaining informed consent, the colonoscope was                     passed under direct vision. Throughout the procedure, the                     patient's blood pressure, pulse, and oxygen saturations                     were monitored continuously. The Colonoscope was                     introduced through the anus and advanced to the the cecum,  identified by appendiceal orifice and ileocecal valve. The         colonoscopy was performed without difficulty. The patient                     tolerated the procedure well. The quality of the bowel                     preparation was good. Findings:      The perianal and digital rectal examinations were normal.      A 4 mm polyp was found in the transverse colon. The polyp was sessile.       The polyp was removed with a cold biopsy forceps. Resection and       retrieval were complete.      Two sessile polyps were found in the sigmoid colon. The polyps were 5 to       7 mm in size. These polyps were removed with a cold snare. Resection and       retrieval were complete.      A 3 mm polyp was found in the sigmoid colon. The polyp was sessile. The       polyp was removed with a cold biopsy forceps. Resection and retrieval       were complete.      Multiple small-mouthed diverticula were found in the sigmoid colon.      Non-bleeding internal hemorrhoids were found during retroflexion. The       hemorrhoids were Grade II (internal hemorrhoids that prolapse but reduce       spontaneously). Impression:        - One 4 mm polyp in the transverse colon. Resected and                     retrieved.                    - Two 5 to 7 mm polyps in the sigmoid colon. Resected and                     retrieved.                    - One 3 mm polyp in the sigmoid colon. Resected and                     retrieved.                    - Diverticulosis in the sigmoid colon.                    - Non-bleeding internal hemorrhoids. Recommendation:    - Await pathology results. Procedure Code(s): --- Professional ---                    [phone], Colonoscopy, flexible; with removal of tumor(s),                     polyp(s), or other lesion(s) by snare technique                    96222, 61, Colonoscopy, flexible; with biopsy, single or                     multiple Diagnosis Code(s): --- Professional ---  Z86.010, Personal history of colonic polyps                     D12.3, Benign neoplasm of transverse colon                    D12.5, Benign neoplasm of sigmoid colon CPT copyright 2014 American Medical Association. All rights reserved. The codes documented in this report are preliminary and upon coder review may  be revised to meet current compliance requirements. Lucilla Lame, MD 09/10/2015 9:15:35 AM This report has been signed electronically. Number of Addenda: 0 Note Initiated On: 09/10/2015 8:57 AM Scope Withdrawal Time: 0 hours 8 minutes 58 seconds  Total Procedure Duration: 0 hours 12 minutes 31 seconds       [facility]

## 2015-09-10 NOTE — Anesthesia Procedure Notes (Signed)
Performed by: COOK-MARTIN, Darrion Macaulay Pre-anesthesia Checklist: Patient identified, Emergency Drugs available, Suction available, Patient being monitored and Timeout performed Patient Re-evaluated:Patient Re-evaluated prior to inductionOxygen Delivery Method: Nasal cannula Preoxygenation: Pre-oxygenation with 100% oxygen Intubation Type: IV induction Placement Confirmation: positive ETCO2 and CO2 detector       

## 2015-09-10 NOTE — H&P (Signed)
  [facility]  [address] [place], [place] [address] Phone: [phone] Fax : [phone]  Primary Care Physician:  PROVIDER NOT IN SYSTEM Primary Gastroenterologist:  Dr. Allen Norris  Pre-Procedure History & Physical: HPI:  Brent Hoover is a 81 y.o. male is here for a screening colonoscopy.   Past Medical History  Diagnosis Date  . Chronic lymphocytic leukemia (CLL), B-cell (HCC)     Lambda positive B-cell CLL  . Hypertension   . Hypercholesterolemia   . Gout   . Bladder neck obstruction   . Cataracts, bilateral   . Sleep apnea   . Lymphocytosis   . Thrombocytopenia [facility])     Past Surgical History  Procedure Laterality Date  . Prostate surgery  2008    Prior to Admission medications   Medication Sig Start Date End Date Taking? Authorizing Provider  allopurinol (ZYLOPRIM) 100 MG tablet Take 100 mg by mouth 2 (two) times daily.   Yes Historical Provider, MD  amLODipine (NORVASC) 10 MG tablet Take 5 mg by mouth daily. Patient takes a half of the 10 mg tablet to equal 5 mg every day   Yes Historical Provider, MD  lisinopril (PRINIVIL,ZESTRIL) 10 MG tablet Take 10 mg by mouth daily.   Yes Historical Provider, MD  lisinopril-hydrochlorothiazide (PRINZIDE,ZESTORETIC) 20-12.5 MG tablet Take 1 tablet by mouth daily.   Yes Historical Provider, MD    Allergies as of 08/08/2015 - Review Complete 08/06/2015  Allergen Reaction Noted  . Penicillins Other (See Comments) 02/26/2015    Family History  Problem Relation Age of Onset  . Brain cancer Son 5    pt's only son  . Lung cancer Mother 60    deceased  . Polycythemia Brother     half brother  . Prostate cancer Brother     Social History   Social History  . Marital Status: Married    Spouse Name: N/A  . Number of Children: N/A  . Years of Education: N/A   Occupational History  . Not on file.   Social History Main Topics  . Smoking status: Never Smoker   . Smokeless tobacco: Not on file  .  Alcohol Use: No  . Drug Use: No  . Sexual Activity: Not on file   Other Topics Concern  . Not on file   Social History Narrative    Review of Systems: See HPI, otherwise negative ROS  Physical Exam: BP 167/73 mmHg  Pulse 54  Temp(Src) 96.5 F (35.8 C) (Tympanic)  Resp 16  Ht 5\' 8"  (1.727 m)  Wt 235 lb (106.595 kg)  BMI 35.74 kg/m2  SpO2 99% General:   Alert,  pleasant and cooperative in NAD Head:  Normocephalic and atraumatic. Neck:  Supple; no masses or thyromegaly. Lungs:  Clear throughout to auscultation.    Heart:  Regular rate and rhythm. Abdomen:  Soft, nontender and nondistended. Normal bowel sounds, without guarding, and without rebound.   Neurologic:  Alert and  oriented x4;  grossly normal neurologically.  Impression/Plan: Brent Hoover is now here to undergo a screening colonoscopy.  Risks, benefits, and alternatives regarding colonoscopy have been reviewed with the patient.  Questions have been answered.  All parties agreeable.

## 2015-09-10 NOTE — Anesthesia Postprocedure Evaluation (Signed)
  Anesthesia Post-op Note  Patient: Brent Hoover  Procedure(s) Performed: Procedure(s): COLONOSCOPY WITH PROPOFOL (N/A)  Anesthesia type:General  Patient location: PACU  Post pain: Pain level controlled  Post assessment: Post-op Vital signs reviewed, Patient's Cardiovascular Status Stable, Respiratory Function Stable, Patent Airway and No signs of Nausea or vomiting  Post vital signs: Reviewed and stable  Last Vitals:  Filed Vitals:   09/10/15 0919  BP: 116/64  Pulse: 53  Temp: 36.5 C  Resp: 22    Level of consciousness: awake, alert  and patient cooperative  Complications: No apparent anesthesia complications

## 2015-09-11 ENCOUNTER — Encounter: Payer: Self-pay | Admitting: Gastroenterology

## 2015-09-11 LAB — SURGICAL PATHOLOGY

## 2015-10-30 DIAGNOSIS — N401 Enlarged prostate with lower urinary tract symptoms: Secondary | ICD-10-CM | POA: Diagnosis not present

## 2015-10-30 DIAGNOSIS — R3915 Urgency of urination: Secondary | ICD-10-CM | POA: Diagnosis not present

## 2015-10-30 DIAGNOSIS — R351 Nocturia: Secondary | ICD-10-CM | POA: Diagnosis not present

## 2015-10-30 DIAGNOSIS — R3916 Straining to void: Secondary | ICD-10-CM | POA: Diagnosis not present

## 2015-10-30 DIAGNOSIS — Z125 Encounter for screening for malignant neoplasm of prostate: Secondary | ICD-10-CM | POA: Diagnosis not present

## 2015-11-05 ENCOUNTER — Encounter: Payer: Self-pay | Admitting: Family Medicine

## 2015-11-05 ENCOUNTER — Inpatient Hospital Stay: Payer: Medicare Other | Attending: Internal Medicine | Admitting: Family Medicine

## 2015-11-05 ENCOUNTER — Inpatient Hospital Stay: Payer: Medicare Other

## 2015-11-05 DIAGNOSIS — D696 Thrombocytopenia, unspecified: Secondary | ICD-10-CM | POA: Diagnosis not present

## 2015-11-05 DIAGNOSIS — D123 Benign neoplasm of transverse colon: Secondary | ICD-10-CM | POA: Insufficient documentation

## 2015-11-05 DIAGNOSIS — K648 Other hemorrhoids: Secondary | ICD-10-CM | POA: Diagnosis not present

## 2015-11-05 DIAGNOSIS — D125 Benign neoplasm of sigmoid colon: Secondary | ICD-10-CM | POA: Diagnosis not present

## 2015-11-05 DIAGNOSIS — E7800 Pure hypercholesterolemia, unspecified: Secondary | ICD-10-CM | POA: Insufficient documentation

## 2015-11-05 DIAGNOSIS — K5730 Diverticulosis of large intestine without perforation or abscess without bleeding: Secondary | ICD-10-CM | POA: Insufficient documentation

## 2015-11-05 DIAGNOSIS — C9110 Chronic lymphocytic leukemia of B-cell type not having achieved remission: Secondary | ICD-10-CM

## 2015-11-05 DIAGNOSIS — Z79899 Other long term (current) drug therapy: Secondary | ICD-10-CM | POA: Diagnosis not present

## 2015-11-05 DIAGNOSIS — M109 Gout, unspecified: Secondary | ICD-10-CM | POA: Insufficient documentation

## 2015-11-05 DIAGNOSIS — I10 Essential (primary) hypertension: Secondary | ICD-10-CM | POA: Diagnosis not present

## 2015-11-05 LAB — COMPREHENSIVE METABOLIC PANEL
ALBUMIN: 4 g/dL (ref 3.5–5.0)
ALK PHOS: 43 U/L (ref 38–126)
ALT: 19 U/L (ref 17–63)
AST: 20 U/L (ref 15–41)
Anion gap: 6 (ref 5–15)
BILIRUBIN TOTAL: 0.8 mg/dL (ref 0.3–1.2)
BUN: 19 mg/dL (ref 6–20)
CALCIUM: 8.9 mg/dL (ref 8.9–10.3)
CO2: 26 mmol/L (ref 22–32)
CREATININE: 1.5 mg/dL — AB (ref 0.61–1.24)
Chloride: 106 mmol/L (ref 101–111)
GFR calc Af Amer: 48 mL/min — ABNORMAL LOW (ref >60)
GFR, EST NON AFRICAN AMERICAN: 41 mL/min — AB (ref >60)
GLUCOSE: 111 mg/dL — AB (ref 65–99)
POTASSIUM: 3.9 mmol/L (ref 3.5–5.1)
Sodium: 138 mmol/L (ref 135–145)
TOTAL PROTEIN: 6.2 g/dL — AB (ref 6.5–8.1)

## 2015-11-05 LAB — CBC WITH DIFFERENTIAL/PLATELET
BASOS ABS: 0.2 10*3/uL — AB (ref 0–0.1)
Eosinophils Absolute: 0.3 10*3/uL (ref 0–0.7)
Eosinophils Relative: 0 %
HEMATOCRIT: 47.6 % (ref 40.0–52.0)
HEMOGLOBIN: 15.1 g/dL (ref 13.0–18.0)
Lymphs Abs: 107 10*3/uL — ABNORMAL HIGH (ref 1.0–3.6)
MCH: 29.9 pg (ref 26.0–34.0)
MCHC: 31.7 g/dL — ABNORMAL LOW (ref 32.0–36.0)
MCV: 94.4 fL (ref 80.0–100.0)
MONO ABS: 1.4 10*3/uL — AB (ref 0.2–1.0)
NEUTROS ABS: 8 10*3/uL — AB (ref 1.4–6.5)
Platelets: 138 10*3/uL — ABNORMAL LOW (ref 150–440)
RBC: 5.04 MIL/uL (ref 4.40–5.90)
RDW: 15.1 % — AB (ref 11.5–14.5)
WBC: 116.9 10*3/uL — AB (ref 3.8–10.6)

## 2015-11-05 LAB — LACTATE DEHYDROGENASE: LDH: 150 U/L (ref 98–192)

## 2015-11-05 NOTE — Progress Notes (Signed)
[facility]  [phone]  [phone]     Brent Hoover DOB: 07-Sep-1930  MR#: [id number]  MVE#:[id number]  Patient Care Team: Provider Not In System as PCP - General Cammie Sickle, MD as Consulting Physician (Internal Medicine)  CHIEF COMPLAINT:  Chief Complaint  Patient presents with  . Cancer    CLL follow up   2007- CLL [by Flow]; RAI STAGE I; [place]- SEP 2016- Splenomegaly [15cm/1400cc];   COLONOSCOPY- performed in November 2016 with Dr. Allen Norris.  INTERVAL HISTORY:  Patient is here for continued follow-up for CLL. He was previously followed by Dr. Inez Pilgrim and has since transferred care to Dr. Rogue Bussing. Patient has recently had a routine colonoscopy in November 2016 with Dr. Allen Norris. One 4 mm polyp in the transverse colon; Resected and retrieved, Two 5 to 7 mm polyps in the sigmoid colon; Resected and retrieved. One 3 mm polyp in the sigmoid colon; Resected and retrieved. Diverticulosis in the sigmoid colon. Non-bleeding internal hemorrhoids. All negative for dysplasia. He reports overall feeling very well. He denies any enlarging lymph nodes, abdominal pain, cough, fever, night sweats.  REVIEW OF SYSTEMS:   Review of Systems  Constitutional: Negative for fever, chills, weight loss, malaise/fatigue and diaphoresis.  HENT: Negative.   Eyes: Negative.   Respiratory: Negative for cough, hemoptysis, sputum production, shortness of breath and wheezing.   Cardiovascular: Negative for chest pain, palpitations, orthopnea, claudication, leg swelling and PND.  Gastrointestinal: Negative for heartburn, nausea, vomiting, abdominal pain, diarrhea, constipation, blood in stool and melena.  Genitourinary: Negative.   Musculoskeletal: Negative.   Skin: Negative.   Neurological: Negative for dizziness, tingling, focal weakness, seizures and weakness.  Endo/Heme/Allergies: Does not bruise/bleed easily.  Psychiatric/Behavioral: Negative for depression. The  patient is not nervous/anxious and does not have insomnia.     As per HPI. Otherwise, a complete review of systems is negatve.  ONCOLOGY HISTORY: Oncology History   Chief Complaint/Problem List:  Lambda positive B-cell CLL, RAI stage 0, lymphocytosis only. 10/08/06 CT of chest, abdomen and pelvis - No evidence of splenomegaly. No evidence of lymphadenopathy in the chest, abdomen, or pelvis. 10/04/06 flow cytometry of peripheral blood - A monoclonal population of lambda positive B-cells was detected. Phenotype consistent with diagnosis of B cell chronic lymphocytic leukemia/small lymphocytic lymphoma. CD38 positive. 03/06/08 CT Chest abdomen Pelvis:No findings to suggest recurrent disease. No adenopathy. 06/04/09: CT chest/abdomen/pelvis: No evidence of lymphadenopathy/splenomegaly. 06/04/10: ct chest/abdomen/pelvis: no evidence of active malignancy within the thorax or  abdomen or pelvis.      CLL (chronic lymphocytic leukemia) ([place])   10/29/2006 Initial Diagnosis CLL (chronic lymphocytic leukemia)    PAST MEDICAL HISTORY: Past Medical History  Diagnosis Date  . Chronic lymphocytic leukemia (CLL), B-cell (HCC)     Lambda positive B-cell CLL  . Hypertension   . Hypercholesterolemia   . Gout   . Bladder neck obstruction   . Cataracts, bilateral   . Sleep apnea   . Lymphocytosis   . Thrombocytopenia ([place])     PAST SURGICAL HISTORY: Past Surgical History  Procedure Laterality Date  . Prostate surgery  2008  . Colonoscopy with propofol N/A 09/10/2015    Procedure: COLONOSCOPY WITH PROPOFOL;  Surgeon: Lucilla Lame, MD;  Location: [facility] ENDOSCOPY;  Service: Endoscopy;  Laterality: N/A;    FAMILY HISTORY Family History  Problem Relation Age of Onset  . Brain cancer Son 85    pt's only son  . Lung cancer Mother 28    deceased  .  Polycythemia Brother     half brother  . Prostate cancer Brother     GYNECOLOGIC HISTORY:  No LMP for male patient.     ADVANCED DIRECTIVES:     HEALTH MAINTENANCE: Social History  Substance Use Topics  . Smoking status: Never Smoker   . Smokeless tobacco: Not on file  . Alcohol Use: No     Colonoscopy:  PAP:  Bone density:  Lipid panel:  Allergies  Allergen Reactions  . Penicillins Other (See Comments)    unknown    Current Outpatient Prescriptions  Medication Sig Dispense Refill  . allopurinol (ZYLOPRIM) 100 MG tablet Take 100 mg by mouth 2 (two) times daily.    Marland Kitchen amLODipine (NORVASC) 10 MG tablet Take 5 mg by mouth daily. Patient takes a half of the 10 mg tablet to equal 5 mg every day    . lisinopril (PRINIVIL,ZESTRIL) 10 MG tablet Take 10 mg by mouth daily.    Marland Kitchen lisinopril-hydrochlorothiazide (PRINZIDE,ZESTORETIC) 20-12.5 MG tablet Take 1 tablet by mouth daily.     No current facility-administered medications for this visit.    OBJECTIVE: BP 155/67 mmHg  Pulse 65  Temp(Src) 97.9 F (36.6 C) (Tympanic)  Wt 254 lb 3.1 oz (115.3 kg)   Body mass index is 38.66 kg/(m^2).    ECOG FS:0 - Asymptomatic  General: Well-developed, well-nourished, no acute distress. Eyes: Pink conjunctiva, anicteric sclera. HEENT: Normocephalic, moist mucous membranes, clear oropharnyx. Lungs: Clear to auscultation bilaterally. Heart: Regular rate and rhythm. No rubs, murmurs, or gallops. Abdomen: Soft, nontender, nondistended. No organomegaly noted, normoactive bowel sounds. Musculoskeletal: No edema, cyanosis, or clubbing. Neuro: Alert, answering all questions appropriately. Cranial nerves grossly intact. Skin: No rashes or petechiae noted. Psych: Normal affect. Lymphatics: No cervical, clavicular, axillary LAD.   LAB RESULTS:  Office Visit on 11/05/2015  Component Date Value Ref Range Status  . WBC 11/05/2015 116.9* 3.8 - 10.6 K/uL Final   Comment: [facility] CRITICAL VALUE PROTOCOL RESULT REPEATED AND VERIFIED   . RBC 11/05/2015 5.04  4.40 - 5.90 MIL/uL Final  . Hemoglobin 11/05/2015 15.1  13.0 - 18.0 g/dL Final   . HCT 11/05/2015 47.6  40.0 - 52.0 % Final  . MCV 11/05/2015 94.4  80.0 - 100.0 fL Final  . MCH 11/05/2015 29.9  26.0 - 34.0 pg Final  . MCHC 11/05/2015 31.7* 32.0 - 36.0 g/dL Final  . RDW 11/05/2015 15.1* 11.5 - 14.5 % Final  . Platelets 11/05/2015 138* 150 - 440 K/uL Final  . Neutrophils Relative % 11/05/2015 7%   Final  . Neutro Abs 11/05/2015 8.0* 1.4 - 6.5 K/uL Final  . Lymphocytes Relative 11/05/2015 92%   Final  . Lymphs Abs 11/05/2015 107.0* 1.0 - 3.6 K/uL Final  . Monocytes Relative 11/05/2015 1%   Final  . Monocytes Absolute 11/05/2015 1.4* 0.2 - 1.0 K/uL Final  . Eosinophils Relative 11/05/2015 0%   Final  . Eosinophils Absolute 11/05/2015 0.3  0 - 0.7 K/uL Final  . Basophils Relative 11/05/2015 0%   Final  . Basophils Absolute 11/05/2015 0.2* 0 - 0.1 K/uL Final  Appointment on 11/05/2015  Component Date Value Ref Range Status  . Sodium 11/05/2015 138  135 - 145 mmol/L Final  . Potassium 11/05/2015 3.9  3.5 - 5.1 mmol/L Final  . Chloride 11/05/2015 106  101 - 111 mmol/L Final  . CO2 11/05/2015 26  22 - 32 mmol/L Final  . Glucose, Bld 11/05/2015 111* 65 - 99 mg/dL Final  . BUN 11/05/2015  19  6 - 20 mg/dL Final  . Creatinine, Ser 11/05/2015 1.50* 0.61 - 1.24 mg/dL Final  . Calcium 11/05/2015 8.9  8.9 - 10.3 mg/dL Final  . Total Protein 11/05/2015 6.2* 6.5 - 8.1 g/dL Final  . Albumin 11/05/2015 4.0  3.5 - 5.0 g/dL Final  . AST 11/05/2015 20  15 - 41 U/L Final  . ALT 11/05/2015 19  17 - 63 U/L Final  . Alkaline Phosphatase 11/05/2015 43  38 - 126 U/L Final  . Total Bilirubin 11/05/2015 0.8  0.3 - 1.2 mg/dL Final  . GFR calc non Af Amer 11/05/2015 41* >60 mL/min Final  . GFR calc Af Amer 11/05/2015 48* >60 mL/min Final   Comment: (NOTE) The eGFR has been calculated using the CKD EPI equation. This calculation has not been validated in all clinical situations. eGFR's persistently <60 mL/min signify possible Chronic Kidney Disease.   . Anion gap 11/05/2015 6  5 - 15  Final  . LDH 11/05/2015 150  98 - 192 U/L Final    STUDIES: No results found.  ASSESSMENT:  CLL  PLAN:   1. CLL, B-cell in nature. CBC appears overall stable with only lymphocytosis and mild thrombocytopenia. WBC 116.9, Platelets 138. Overall he is clinically stable. No splenomegaly, no palpable lymph nodes. FISH panel is pending. We'll have patient return in 12 weeks for continued lab work and further evaluation with Dr. Rogue Bussing.  Patient expressed understanding and was in agreement with this plan. He also understands that He can call clinic at any time with any questions, concerns, or complaints.   Dr. Oliva Bustard was available for consultation and review of plan of care for this patient.  Evlyn Kanner, NP   11/05/2015 11:16 AM

## 2015-11-06 LAB — HEPATITIS B SURFACE ANTIBODY, QUANTITATIVE: Hepatitis B-Post: <3.1 m[IU]/mL — ABNORMAL LOW (ref >9.9)

## 2015-11-06 LAB — IGG, IGA, IGM
IGA: 104 mg/dL (ref 61–437)
IGG (IMMUNOGLOBIN G), SERUM: 525 mg/dL — AB (ref 700–1600)
IgM, Serum: 16 mg/dL (ref 15–143)

## 2015-11-07 LAB — COMP PANEL: LEUKEMIA/LYMPHOMA: Immunophenotypic Profile: 93

## 2015-11-13 LAB — FISH HES LEUKEMIA, 4Q12 REA

## 2015-11-14 ENCOUNTER — Other Ambulatory Visit: Payer: Self-pay | Admitting: Family Medicine

## 2016-01-27 ENCOUNTER — Other Ambulatory Visit: Payer: Self-pay | Admitting: *Deleted

## 2016-01-27 DIAGNOSIS — C9110 Chronic lymphocytic leukemia of B-cell type not having achieved remission: Secondary | ICD-10-CM

## 2016-01-28 ENCOUNTER — Inpatient Hospital Stay: Payer: Medicare Other | Attending: Internal Medicine

## 2016-01-28 ENCOUNTER — Inpatient Hospital Stay (HOSPITAL_BASED_OUTPATIENT_CLINIC_OR_DEPARTMENT_OTHER): Payer: Medicare Other | Admitting: Internal Medicine

## 2016-01-28 VITALS — BP 143/81 | HR 87 | Temp 98.5°F | Resp 18 | Wt 250.7 lb

## 2016-01-28 DIAGNOSIS — Z8042 Family history of malignant neoplasm of prostate: Secondary | ICD-10-CM | POA: Diagnosis not present

## 2016-01-28 DIAGNOSIS — C9110 Chronic lymphocytic leukemia of B-cell type not having achieved remission: Secondary | ICD-10-CM | POA: Diagnosis not present

## 2016-01-28 DIAGNOSIS — R161 Splenomegaly, not elsewhere classified: Secondary | ICD-10-CM

## 2016-01-28 DIAGNOSIS — Z801 Family history of malignant neoplasm of trachea, bronchus and lung: Secondary | ICD-10-CM | POA: Insufficient documentation

## 2016-01-28 DIAGNOSIS — Z880 Allergy status to penicillin: Secondary | ICD-10-CM

## 2016-01-28 DIAGNOSIS — E7800 Pure hypercholesterolemia, unspecified: Secondary | ICD-10-CM | POA: Diagnosis not present

## 2016-01-28 DIAGNOSIS — M109 Gout, unspecified: Secondary | ICD-10-CM | POA: Diagnosis not present

## 2016-01-28 DIAGNOSIS — Z79899 Other long term (current) drug therapy: Secondary | ICD-10-CM | POA: Insufficient documentation

## 2016-01-28 DIAGNOSIS — G4730 Sleep apnea, unspecified: Secondary | ICD-10-CM | POA: Diagnosis not present

## 2016-01-28 DIAGNOSIS — I10 Essential (primary) hypertension: Secondary | ICD-10-CM

## 2016-01-28 DIAGNOSIS — Z86010 Personal history of colonic polyps: Secondary | ICD-10-CM | POA: Diagnosis not present

## 2016-01-28 LAB — COMPREHENSIVE METABOLIC PANEL
ALBUMIN: 4.3 g/dL (ref 3.5–5.0)
ALT: 19 U/L (ref 17–63)
AST: 23 U/L (ref 15–41)
Alkaline Phosphatase: 40 U/L (ref 38–126)
Anion gap: 3 — ABNORMAL LOW (ref 5–15)
BILIRUBIN TOTAL: 0.9 mg/dL (ref 0.3–1.2)
BUN: 18 mg/dL (ref 6–20)
CHLORIDE: 106 mmol/L (ref 101–111)
CO2: 27 mmol/L (ref 22–32)
CREATININE: 1.36 mg/dL — AB (ref 0.61–1.24)
Calcium: 8.6 mg/dL — ABNORMAL LOW (ref 8.9–10.3)
GFR calc Af Amer: 54 mL/min — ABNORMAL LOW (ref >60)
GFR, EST NON AFRICAN AMERICAN: 46 mL/min — AB (ref >60)
GLUCOSE: 104 mg/dL — AB (ref 65–99)
Potassium: 3.9 mmol/L (ref 3.5–5.1)
Sodium: 136 mmol/L (ref 135–145)
TOTAL PROTEIN: 6.4 g/dL — AB (ref 6.5–8.1)

## 2016-01-28 LAB — CBC WITH DIFFERENTIAL/PLATELET
BASOS ABS: 0.4 10*3/uL — AB (ref 0–0.1)
Basophils Relative: 0 %
Eosinophils Absolute: 0.3 10*3/uL (ref 0–0.7)
Eosinophils Relative: 0 %
HEMATOCRIT: 49.4 % (ref 40.0–52.0)
Hemoglobin: 15.3 g/dL (ref 13.0–18.0)
Lymphs Abs: 106.9 10*3/uL — ABNORMAL HIGH (ref 1.0–3.6)
MCH: 29.2 pg (ref 26.0–34.0)
MCHC: 31 g/dL — ABNORMAL LOW (ref 32.0–36.0)
MCV: 94.2 fL (ref 80.0–100.0)
Monocytes Absolute: 0.9 10*3/uL (ref 0.2–1.0)
NEUTROS ABS: 7.2 10*3/uL — AB (ref 1.4–6.5)
Neutrophils Relative %: 6 %
Platelets: 126 10*3/uL — ABNORMAL LOW (ref 150–440)
RBC: 5.25 MIL/uL (ref 4.40–5.90)
RDW: 15.8 % — ABNORMAL HIGH (ref 11.5–14.5)
WBC: 115.7 10*3/uL (ref 3.8–10.6)

## 2016-01-28 LAB — LACTATE DEHYDROGENASE: LDH: 149 U/L (ref 98–192)

## 2016-01-28 NOTE — Progress Notes (Signed)
[place] OFFICE PROGRESS NOTE   SUMMARY OF ONCOLOGIC HISTORY:  # 2007- CLL [by Flow]; RAI STAGE I; [place]- SEP 2016- Splenomegaly [15cm/1400cc]; HOMOZYGOUS 13Q DELETION/CD-38 negative.   # COLONOSCOPY- [[facility]; small polyps Nov 2016- recomm none in future ]    INTERVAL HISTORY:  A very pleasant 82 year old male patient with above history of CLL currently monitored is here for follow-up/ accompanied by his wife.   He denies having any frequent infections or any admission the hospital. Patient denies any unusual weight loss; denies any night sweats or extreme fatigue recurrent infections or fevers.  His appetite is good. Denies any blood in stools or black stools.  REVIEW OF SYSTEMS:  A complete 10 point review of system is done which is negative except mentioned above/history of present illness.   PAST MEDICAL HISTORY :  Past Medical History  Diagnosis Date  . Chronic lymphocytic leukemia (CLL), B-cell (HCC)     Lambda positive B-cell CLL  . Hypertension   . Hypercholesterolemia   . Gout   . Bladder neck obstruction   . Cataracts, bilateral   . Sleep apnea   . Lymphocytosis   . Thrombocytopenia ([place])     PAST SURGICAL HISTORY :   Past Surgical History  Procedure Laterality Date  . Prostate surgery  2008  . Colonoscopy with propofol N/A 09/10/2015    Procedure: COLONOSCOPY WITH PROPOFOL;  Surgeon: Lucilla Lame, MD;  Location: [facility] ENDOSCOPY;  Service: Endoscopy;  Laterality: N/A;    FAMILY HISTORY :   Family History  Problem Relation Age of Onset  . Brain cancer Son 82    pt's only son  . Lung cancer Mother 1    deceased  . Polycythemia Brother     half brother  . Prostate cancer Brother     SOCIAL HISTORY:   Social History  Substance Use Topics  . Smoking status: Never Smoker   . Smokeless tobacco: Not on file  . Alcohol Use: No    ALLERGIES:  is allergic to penicillins.  MEDICATIONS:  Current Outpatient Prescriptions  Medication Sig  Dispense Refill  . allopurinol (ZYLOPRIM) 100 MG tablet Take 100 mg by mouth 2 (two) times daily.    Marland Kitchen amLODipine (NORVASC) 10 MG tablet Take 5 mg by mouth daily. Patient takes a half of the 10 mg tablet to equal 5 mg every day    . lisinopril-hydrochlorothiazide (PRINZIDE,ZESTORETIC) 20-12.5 MG tablet Take 1 tablet by mouth daily.     No current facility-administered medications for this visit.    PHYSICAL EXAMINATION: ECOG PERFORMANCE STATUS: 0 - Asymptomatic  BP 143/81 mmHg  Pulse 87  Temp(Src) 98.5 F (36.9 C) (Tympanic)  Resp 18  Wt 250 lb 10.6 oz (113.7 kg)  Filed Weights   01/28/16 1018  Weight: 250 lb 10.6 oz (113.7 kg)    GENERAL: Well-nourished well-developed; Alert, no distress and comfortable.   He is accompanied by his wife.  EYES: no pallor or icterus OROPHARYNX: no thrush or ulceration; good dentition  NECK: supple, no masses felt LYMPH:  no palpable lymphadenopathy in the cervical, axillary or inguinal regions LUNGS: clear to auscultation and  No wheeze or crackles HEART/CVS: regular rate & rhythm and no murmurs; No lower extremity edema ABDOMEN:abdomen soft, non-tender and normal bowel sounds Musculoskeletal:no cyanosis of digits and no clubbing  PSYCH: alert & oriented x 3 with fluent speech NEURO: no focal motor/sensory deficits SKIN:  no rashes or significant lesions  LABORATORY DATA:  I have reviewed  the data as listed    Component Value Date/Time   NA 136 01/28/2016 0932   NA 137 09/14/2014 0834   K 3.9 01/28/2016 0932   K 4.0 09/14/2014 0834   CL 106 01/28/2016 0932   CL 101 09/14/2014 0834   CO2 27 01/28/2016 0932   CO2 27 09/14/2014 0834   GLUCOSE 104* 01/28/2016 0932   GLUCOSE 123* 09/14/2014 0834   BUN 18 01/28/2016 0932   BUN 14 09/14/2014 0834   CREATININE 1.36* 01/28/2016 0932   CREATININE 1.14 02/05/2015 0949   CALCIUM 8.6* 01/28/2016 0932   CALCIUM 8.8 09/14/2014 0834   PROT 6.4* 01/28/2016 0932   PROT 6.7 09/14/2014 0834    ALBUMIN 4.3 01/28/2016 0932   ALBUMIN 3.6 09/14/2014 0834   AST 23 01/28/2016 0932   AST 21 09/14/2014 0834   ALT 19 01/28/2016 0932   ALT 26 09/14/2014 0834   ALKPHOS 40 01/28/2016 0932   ALKPHOS 49 09/14/2014 0834   BILITOT 0.9 01/28/2016 0932   BILITOT 1.1* 09/14/2014 0834   GFRNONAA 46* 01/28/2016 0932   GFRNONAA 60* 02/05/2015 0949   GFRNONAA 57* 11/13/2014 0933   GFRAA 54* 01/28/2016 0932   GFRAA >60 02/05/2015 0949   GFRAA >60 11/13/2014 0933    No results found for: SPEP, UPEP  Lab Results  Component Value Date   WBC 115.7* 01/28/2016   NEUTROABS 7.2* 01/28/2016   HGB 15.3 01/28/2016   HCT 49.4 01/28/2016   MCV 94.2 01/28/2016   PLT 126* 01/28/2016      Chemistry      Component Value Date/Time   NA 136 01/28/2016 0932   NA 137 09/14/2014 0834   K 3.9 01/28/2016 0932   K 4.0 09/14/2014 0834   CL 106 01/28/2016 0932   CL 101 09/14/2014 0834   CO2 27 01/28/2016 0932   CO2 27 09/14/2014 0834   BUN 18 01/28/2016 0932   BUN 14 09/14/2014 0834   CREATININE 1.36* 01/28/2016 0932   CREATININE 1.14 02/05/2015 0949      Component Value Date/Time   CALCIUM 8.6* 01/28/2016 0932   CALCIUM 8.8 09/14/2014 0834   ALKPHOS 40 01/28/2016 0932   ALKPHOS 49 09/14/2014 0834   AST 23 01/28/2016 0932   AST 21 09/14/2014 0834   ALT 19 01/28/2016 0932   ALT 26 09/14/2014 0834   BILITOT 0.9 01/28/2016 0932   BILITOT 1.1* 09/14/2014 0834         ASSESSMENT & PLAN:   # CLL Rai Stage I- patient is currently asymptomatic; we will continue to monitor for now. Patient's white count is 116,000 today; in April 2016 it was a 100,000. Hemoglobin stable and 14/platelets 126 study. 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  #  Discussed  some of the new novel therapies for CLL / Again as patient is asymptomatic; withhold any treatment at this time.  # Patient was given a copy of his labs/ will follow-up with me in 4 months with labs. He'll call us sooner if needed.  # 15  minutes face-to-face with the patient discussing the above plan of care; more than 50% of time spent on natural history; counseling and coordination.     Cammie Sickle, MD 01/28/2016 10:56 AM

## 2016-04-28 DIAGNOSIS — N401 Enlarged prostate with lower urinary tract symptoms: Secondary | ICD-10-CM | POA: Diagnosis not present

## 2016-04-28 DIAGNOSIS — R972 Elevated prostate specific antigen [PSA]: Secondary | ICD-10-CM | POA: Diagnosis not present

## 2016-04-28 DIAGNOSIS — R3915 Urgency of urination: Secondary | ICD-10-CM | POA: Diagnosis not present

## 2016-04-28 DIAGNOSIS — R350 Frequency of micturition: Secondary | ICD-10-CM | POA: Diagnosis not present

## 2016-05-13 DIAGNOSIS — R972 Elevated prostate specific antigen [PSA]: Secondary | ICD-10-CM | POA: Diagnosis not present

## 2016-05-25 DIAGNOSIS — R972 Elevated prostate specific antigen [PSA]: Secondary | ICD-10-CM | POA: Diagnosis not present

## 2016-05-25 DIAGNOSIS — N401 Enlarged prostate with lower urinary tract symptoms: Secondary | ICD-10-CM | POA: Diagnosis not present

## 2016-06-02 ENCOUNTER — Inpatient Hospital Stay (HOSPITAL_BASED_OUTPATIENT_CLINIC_OR_DEPARTMENT_OTHER): Payer: Medicare Other | Admitting: Internal Medicine

## 2016-06-02 ENCOUNTER — Inpatient Hospital Stay: Payer: Medicare Other | Attending: Internal Medicine

## 2016-06-02 DIAGNOSIS — Z79899 Other long term (current) drug therapy: Secondary | ICD-10-CM | POA: Insufficient documentation

## 2016-06-02 DIAGNOSIS — C9110 Chronic lymphocytic leukemia of B-cell type not having achieved remission: Secondary | ICD-10-CM | POA: Insufficient documentation

## 2016-06-02 DIAGNOSIS — N320 Bladder-neck obstruction: Secondary | ICD-10-CM | POA: Insufficient documentation

## 2016-06-02 DIAGNOSIS — R5383 Other fatigue: Secondary | ICD-10-CM | POA: Diagnosis not present

## 2016-06-02 DIAGNOSIS — Z808 Family history of malignant neoplasm of other organs or systems: Secondary | ICD-10-CM | POA: Insufficient documentation

## 2016-06-02 DIAGNOSIS — Z8042 Family history of malignant neoplasm of prostate: Secondary | ICD-10-CM | POA: Diagnosis not present

## 2016-06-02 DIAGNOSIS — E7800 Pure hypercholesterolemia, unspecified: Secondary | ICD-10-CM

## 2016-06-02 DIAGNOSIS — G4730 Sleep apnea, unspecified: Secondary | ICD-10-CM

## 2016-06-02 DIAGNOSIS — I10 Essential (primary) hypertension: Secondary | ICD-10-CM | POA: Diagnosis not present

## 2016-06-02 DIAGNOSIS — Z801 Family history of malignant neoplasm of trachea, bronchus and lung: Secondary | ICD-10-CM | POA: Diagnosis not present

## 2016-06-02 DIAGNOSIS — D696 Thrombocytopenia, unspecified: Secondary | ICD-10-CM

## 2016-06-02 DIAGNOSIS — M109 Gout, unspecified: Secondary | ICD-10-CM | POA: Diagnosis not present

## 2016-06-02 DIAGNOSIS — D72829 Elevated white blood cell count, unspecified: Secondary | ICD-10-CM | POA: Diagnosis not present

## 2016-06-02 LAB — COMPREHENSIVE METABOLIC PANEL
ALT: 17 U/L (ref 17–63)
AST: 21 U/L (ref 15–41)
Albumin: 4.2 g/dL (ref 3.5–5.0)
Alkaline Phosphatase: 41 U/L (ref 38–126)
Anion gap: 7 (ref 5–15)
BILIRUBIN TOTAL: 1.2 mg/dL (ref 0.3–1.2)
BUN: 18 mg/dL (ref 6–20)
CHLORIDE: 100 mmol/L — AB (ref 101–111)
CO2: 26 mmol/L (ref 22–32)
CREATININE: 1.52 mg/dL — AB (ref 0.61–1.24)
Calcium: 9.4 mg/dL (ref 8.9–10.3)
GFR calc Af Amer: 47 mL/min — ABNORMAL LOW (ref >60)
GFR, EST NON AFRICAN AMERICAN: 41 mL/min — AB (ref >60)
Glucose, Bld: 99 mg/dL (ref 65–99)
Potassium: 4.5 mmol/L (ref 3.5–5.1)
Sodium: 133 mmol/L — ABNORMAL LOW (ref 135–145)
Total Protein: 6.6 g/dL (ref 6.5–8.1)

## 2016-06-02 LAB — CBC WITH DIFFERENTIAL/PLATELET
BASOS ABS: 0.3 10*3/uL — AB (ref 0–0.1)
Basophils Relative: 0 %
Eosinophils Absolute: 0.3 10*3/uL (ref 0–0.7)
Eosinophils Relative: 0 %
HEMATOCRIT: 49.6 % (ref 40.0–52.0)
Hemoglobin: 15.6 g/dL (ref 13.0–18.0)
Lymphs Abs: 115.8 10*3/uL — ABNORMAL HIGH (ref 1.0–3.6)
MCH: 29.6 pg (ref 26.0–34.0)
MCHC: 31.4 g/dL — ABNORMAL LOW (ref 32.0–36.0)
MCV: 94.2 fL (ref 80.0–100.0)
Monocytes Absolute: 1.4 10*3/uL — ABNORMAL HIGH (ref 0.2–1.0)
Monocytes Relative: 1 %
NEUTROS ABS: 6.1 10*3/uL (ref 1.4–6.5)
PLATELETS: 131 10*3/uL — AB (ref 150–440)
RBC: 5.27 MIL/uL (ref 4.40–5.90)
RDW: 15.8 % — ABNORMAL HIGH (ref 11.5–14.5)
WBC: 123.8 10*3/uL — AB (ref 3.8–10.6)

## 2016-06-02 LAB — LACTATE DEHYDROGENASE: LDH: 122 U/L (ref 98–192)

## 2016-06-02 NOTE — Assessment & Plan Note (Addendum)
#   CLL Rai Stage I- patient is currently asymptomatic; we will continue to monitor for now. Patient's white count is 128 today; . Hemoglobin stable and 14/platelets 130s-steady. 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 Check IGVH mutation at next visit.   #  Discussed  some of the new novel therapies for CLL / Again as patient is asymptomatic; withhold any treatment at this time.  # Patient was given a copy of his labs/ will follow-up with me in 4 months with labs. He'll call us sooner if needed.  # 15 minutes face-to-face with the patient discussing the above plan of care; more than 50% of time spent on natural history; counseling and coordination.

## 2016-06-02 NOTE — Progress Notes (Signed)
[place] OFFICE PROGRESS NOTE   SUMMARY OF ONCOLOGIC HISTORY:  Oncology History   Chief Complaint/Problem List:  Lambda positive B-cell CLL, RAI stage 0, lymphocytosis only. 10/08/06 CT of chest, abdomen and pelvis - No evidence of splenomegaly. No evidence of lymphadenopathy in the chest, abdomen, or pelvis. 10/04/06 flow cytometry of peripheral blood - A monoclonal population of lambda positive B-cells was detected. Phenotype consistent with diagnosis of B cell chronic lymphocytic leukemia/small lymphocytic lymphoma. CD38 positive. 03/06/08 CT Chest abdomen Pelvis:No findings to suggest recurrent disease. No adenopathy. 06/04/09: CT chest/abdomen/pelvis: No evidence of lymphadenopathy/splenomegaly. 06/04/10: ct chest/abdomen/pelvis: no evidence of active malignancy within the thorax or  abdomen or pelvis.  # CLL- 13q del; July 2017 IGVH ordered.   # Prostate Biopsy [PSA ~5; Dr.Wolfe]; June 2017      CLL (chronic lymphocytic leukemia) ([place])   10/29/2006 Initial Diagnosis    CLL (chronic lymphocytic leukemia)         INTERVAL HISTORY:  A very pleasant 82 year old male patient with above history of CLL currently monitored is here for follow-up/ accompanied by his wife.   In the interim he had a prostate biopsy that was benign as per patient.  Patient has been trying to lose weight. No significant weight loss. Chronic mild fatigue.  He denies having any frequent infections or any admission the hospital. Denies any night sweats or extreme fatigue recurrent infections or fevers. His appetite is good. Denies any blood in stools or black stools.  REVIEW OF SYSTEMS:  A complete 10 point review of system is done which is negative except mentioned above/history of present illness.   PAST MEDICAL HISTORY :  Past Medical History:  Diagnosis Date  . Bladder neck obstruction   . Cataracts, bilateral   . Chronic lymphocytic leukemia (CLL), B-cell (HCC)    Lambda positive  B-cell CLL  . Gout   . Hypercholesterolemia   . Hypertension   . Lymphocytosis   . Sleep apnea   . Thrombocytopenia ([place])     PAST SURGICAL HISTORY :   Past Surgical History:  Procedure Laterality Date  . COLONOSCOPY WITH PROPOFOL N/A 09/10/2015   Procedure: COLONOSCOPY WITH PROPOFOL;  Surgeon: Lucilla Lame, MD;  Location: [facility] ENDOSCOPY;  Service: Endoscopy;  Laterality: N/A;  . PROSTATE SURGERY  2008    FAMILY HISTORY :   Family History  Problem Relation Age of Onset  . Brain cancer Son 90    pt's only son  . Lung cancer Mother 24    deceased  . Polycythemia Brother     half brother  . Prostate cancer Brother     SOCIAL HISTORY:   Social History  Substance Use Topics  . Smoking status: Never Smoker  . Smokeless tobacco: Not on file  . Alcohol use No    ALLERGIES:  is allergic to penicillins.  MEDICATIONS:  Current Outpatient Prescriptions  Medication Sig Dispense Refill  . allopurinol (ZYLOPRIM) 100 MG tablet Take 100 mg by mouth 2 (two) times daily.    Marland Kitchen amLODipine (NORVASC) 10 MG tablet Take 5 mg by mouth daily. Patient takes a half of the 10 mg tablet to equal 5 mg every day    . lisinopril-hydrochlorothiazide (PRINZIDE,ZESTORETIC) 20-12.5 MG tablet Take 1 tablet by mouth daily.     No current facility-administered medications for this visit.     PHYSICAL EXAMINATION: ECOG PERFORMANCE STATUS: 0 - Asymptomatic  BP 139/68 (BP Location: Left Arm, Patient Position: Sitting)   Pulse (!) 54  Temp 97.4 F (36.3 C) (Tympanic)   Resp 18   Wt 237 lb 10.5 oz (107.8 kg)   BMI 36.14 kg/m   Filed Weights   06/02/16 1015  Weight: 237 lb 10.5 oz (107.8 kg)    GENERAL: Well-nourished well-developed; Alert, no distress and comfortable.   He is accompanied by his wife.  EYES: no pallor or icterus OROPHARYNX: no thrush or ulceration; good dentition  NECK: supple, no masses felt LYMPH:  no palpable lymphadenopathy in the cervical, axillary or inguinal  regions LUNGS: clear to auscultation and  No wheeze or crackles HEART/CVS: regular rate & rhythm and no murmurs; No lower extremity edema ABDOMEN:abdomen soft, non-tender and normal bowel sounds Musculoskeletal:no cyanosis of digits and no clubbing  PSYCH: alert & oriented x 3 with fluent speech NEURO: no focal motor/sensory deficits SKIN:  no rashes or significant lesions  LABORATORY DATA:  I have reviewed the data as listed    Component Value Date/Time   NA 133 (L) 06/02/2016 0957   NA 137 09/14/2014 0834   K 4.5 06/02/2016 0957   K 4.0 09/14/2014 0834   CL 100 (L) 06/02/2016 0957   CL 101 09/14/2014 0834   CO2 26 06/02/2016 0957   CO2 27 09/14/2014 0834   GLUCOSE 99 06/02/2016 0957   GLUCOSE 123 (H) 09/14/2014 0834   BUN 18 06/02/2016 0957   BUN 14 09/14/2014 0834   CREATININE 1.52 (H) 06/02/2016 0957   CREATININE 1.14 02/05/2015 0949   CALCIUM 9.4 06/02/2016 0957   CALCIUM 8.8 09/14/2014 0834   PROT 6.6 06/02/2016 0957   PROT 6.7 09/14/2014 0834   ALBUMIN 4.2 06/02/2016 0957   ALBUMIN 3.6 09/14/2014 0834   AST 21 06/02/2016 0957   AST 21 09/14/2014 0834   ALT 17 06/02/2016 0957   ALT 26 09/14/2014 0834   ALKPHOS 41 06/02/2016 0957   ALKPHOS 49 09/14/2014 0834   BILITOT 1.2 06/02/2016 0957   BILITOT 1.1 (H) 09/14/2014 0834   GFRNONAA 41 (L) 06/02/2016 0957   GFRNONAA 60 (L) 02/05/2015 0949   GFRAA 47 (L) 06/02/2016 0957   GFRAA >60 02/05/2015 0949    No results found for: SPEP, UPEP  Lab Results  Component Value Date   WBC 123.8 (HH) 06/02/2016   NEUTROABS 6.1 06/02/2016   HGB 15.6 06/02/2016   HCT 49.6 06/02/2016   MCV 94.2 06/02/2016   PLT 131 (L) 06/02/2016      Chemistry      Component Value Date/Time   NA 133 (L) 06/02/2016 0957   NA 137 09/14/2014 0834   K 4.5 06/02/2016 0957   K 4.0 09/14/2014 0834   CL 100 (L) 06/02/2016 0957   CL 101 09/14/2014 0834   CO2 26 06/02/2016 0957   CO2 27 09/14/2014 0834   BUN 18 06/02/2016 0957   BUN 14  09/14/2014 0834   CREATININE 1.52 (H) 06/02/2016 0957   CREATININE 1.14 02/05/2015 0949      Component Value Date/Time   CALCIUM 9.4 06/02/2016 0957   CALCIUM 8.8 09/14/2014 0834   ALKPHOS 41 06/02/2016 0957   ALKPHOS 49 09/14/2014 0834   AST 21 06/02/2016 0957   AST 21 09/14/2014 0834   ALT 17 06/02/2016 0957   ALT 26 09/14/2014 0834   BILITOT 1.2 06/02/2016 0957   BILITOT 1.1 (H) 09/14/2014 0834         ASSESSMENT & PLAN:   CLL (chronic lymphocytic leukemia) ([place]) # CLL Rai Stage I- patient is currently asymptomatic;  we will continue to monitor for now. Patient's white count is 128 today; . Hemoglobin stable and 14/platelets 130s-steady. 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 Check IGVH mutation at next visit.   #  Discussed  some of the new novel therapies for CLL / Again as patient is asymptomatic; withhold any treatment at this time.  # Patient was given a copy of his labs/ will follow-up with me in 4 months with labs. He'll call us sooner if needed.  # 15 minutes face-to-face with the patient discussing the above plan of care; more than 50% of time spent on natural history; counseling and coordination.     Cammie Sickle, MD 06/02/2016 10:53 AM

## 2016-10-06 ENCOUNTER — Inpatient Hospital Stay (HOSPITAL_BASED_OUTPATIENT_CLINIC_OR_DEPARTMENT_OTHER): Payer: Medicare Other | Admitting: Internal Medicine

## 2016-10-06 ENCOUNTER — Inpatient Hospital Stay: Payer: Medicare Other | Attending: Internal Medicine

## 2016-10-06 VITALS — BP 151/77 | HR 54 | Temp 96.8°F | Resp 18 | Wt 236.1 lb

## 2016-10-06 DIAGNOSIS — Z800 Family history of malignant neoplasm of digestive organs: Secondary | ICD-10-CM

## 2016-10-06 DIAGNOSIS — C9110 Chronic lymphocytic leukemia of B-cell type not having achieved remission: Secondary | ICD-10-CM

## 2016-10-06 DIAGNOSIS — N320 Bladder-neck obstruction: Secondary | ICD-10-CM | POA: Diagnosis not present

## 2016-10-06 DIAGNOSIS — G4730 Sleep apnea, unspecified: Secondary | ICD-10-CM | POA: Diagnosis not present

## 2016-10-06 DIAGNOSIS — Z79899 Other long term (current) drug therapy: Secondary | ICD-10-CM

## 2016-10-06 DIAGNOSIS — D696 Thrombocytopenia, unspecified: Secondary | ICD-10-CM

## 2016-10-06 DIAGNOSIS — Z808 Family history of malignant neoplasm of other organs or systems: Secondary | ICD-10-CM | POA: Diagnosis not present

## 2016-10-06 DIAGNOSIS — Z8041 Family history of malignant neoplasm of ovary: Secondary | ICD-10-CM | POA: Diagnosis not present

## 2016-10-06 DIAGNOSIS — E7800 Pure hypercholesterolemia, unspecified: Secondary | ICD-10-CM

## 2016-10-06 DIAGNOSIS — I10 Essential (primary) hypertension: Secondary | ICD-10-CM | POA: Insufficient documentation

## 2016-10-06 DIAGNOSIS — N400 Enlarged prostate without lower urinary tract symptoms: Secondary | ICD-10-CM | POA: Diagnosis not present

## 2016-10-06 DIAGNOSIS — M109 Gout, unspecified: Secondary | ICD-10-CM

## 2016-10-06 LAB — CBC WITH DIFFERENTIAL/PLATELET
Basophils Absolute: 0.1 10*3/uL (ref 0–0.1)
Basophils Relative: 0 %
EOS ABS: 0.3 10*3/uL (ref 0–0.7)
EOS PCT: 0 %
HCT: 49.8 % (ref 40.0–52.0)
Hemoglobin: 15.7 g/dL (ref 13.0–18.0)
LYMPHS PCT: 92 %
Lymphs Abs: 99.4 10*3/uL — ABNORMAL HIGH (ref 1.0–3.6)
MCH: 29.8 pg (ref 26.0–34.0)
MCHC: 31.5 g/dL — ABNORMAL LOW (ref 32.0–36.0)
MCV: 94.5 fL (ref 80.0–100.0)
MONO ABS: 1.9 10*3/uL — AB (ref 0.2–1.0)
Monocytes Relative: 2 %
Neutro Abs: 6.1 10*3/uL (ref 1.4–6.5)
Neutrophils Relative %: 6 %
PLATELETS: 124 10*3/uL — AB (ref 150–440)
RBC: 5.27 MIL/uL (ref 4.40–5.90)
RDW: 15 % — AB (ref 11.5–14.5)
WBC: 107.8 10*3/uL (ref 3.8–10.6)

## 2016-10-06 LAB — COMPREHENSIVE METABOLIC PANEL
ALBUMIN: 4.3 g/dL (ref 3.5–5.0)
ALK PHOS: 43 U/L (ref 38–126)
ALT: 13 U/L — AB (ref 17–63)
AST: 19 U/L (ref 15–41)
Anion gap: 9 (ref 5–15)
BILIRUBIN TOTAL: 1.5 mg/dL — AB (ref 0.3–1.2)
BUN: 15 mg/dL (ref 6–20)
CALCIUM: 8.9 mg/dL (ref 8.9–10.3)
CO2: 27 mmol/L (ref 22–32)
CREATININE: 1.29 mg/dL — AB (ref 0.61–1.24)
Chloride: 95 mmol/L — ABNORMAL LOW (ref 101–111)
GFR calc Af Amer: 57 mL/min — ABNORMAL LOW (ref >60)
GFR, EST NON AFRICAN AMERICAN: 50 mL/min — AB (ref >60)
GLUCOSE: 100 mg/dL — AB (ref 65–99)
POTASSIUM: 4.2 mmol/L (ref 3.5–5.1)
Sodium: 131 mmol/L — ABNORMAL LOW (ref 135–145)
TOTAL PROTEIN: 6.7 g/dL (ref 6.5–8.1)

## 2016-10-06 LAB — LACTATE DEHYDROGENASE: LDH: 131 U/L (ref 98–192)

## 2016-10-06 NOTE — Assessment & Plan Note (Addendum)
#   CLL Rai Stage I- patient is currently asymptomatic; we will continue to monitor for now. Patient's white count is 107 today; . Hemoglobin stable and 15/platelets 126s-steady. 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   # Discussed  some of the new novel therapies for CLL / Again as patient is asymptomatic; withhold any treatment at this time.  # Enlarged prostate- s/p ? cryo Dr.Wolfe.   # Patient was given a copy of his labs/ will follow-up with me in 4 months with labs.   # [facility]

## 2016-10-06 NOTE — Progress Notes (Signed)
[place] OFFICE PROGRESS NOTE   SUMMARY OF ONCOLOGIC HISTORY:  Oncology History   Dictating CKChief Complaint/Problem List:  Lambda positive B-cell CLL, RAI stage 0, lymphocytosis only. 10/08/06 CT of chest, abdomen and pelvis - No evidence of splenomegaly. No evidence of lymphadenopathy in the chest, abdomen, or pelvis. 10/04/06 flow cytometry of peripheral blood - A monoclonal population of lambda positive B-cells was detected. Phenotype consistent with diagnosis of B cell chronic lymphocytic leukemia/small lymphocytic lymphoma. CD38 positive. 03/06/08 CT Chest abdomen Pelvis:No findings to suggest recurrent disease. No adenopathy. 06/04/09: CT chest/abdomen/pelvis: No evidence of lymphadenopathy/splenomegaly. 06/04/10: ct chest/abdomen/pelvis: no evidence of active malignancy within the thorax or  abdomen or pelvis.  # CLL- 13q del; July 2017 IGVH ordered.   # Prostate Biopsy [PSA ~5; Dr.Wolfe]; June 2017;   # CKD [creat 1.5]     CLL (chronic lymphocytic leukemia) ([place])   10/29/2006 Initial Diagnosis    CLL (chronic lymphocytic leukemia)          INTERVAL HISTORY:  A very pleasant 82 year old male patient with above history of CLL currently monitored is here for follow-up/ accompanied by his wife.   He denies having any frequent infections or any admission the hospital. Denies any night sweats or extreme fatigue recurrent infections or fevers. His appetite is good. Patient has been trying to lose weight. No significant weight loss. Chronic mild fatigue.  Denies any blood in stools or black stools.  REVIEW OF SYSTEMS:  A complete 10 point review of system is done which is negative except mentioned above/history of present illness.   PAST MEDICAL HISTORY :  Past Medical History:  Diagnosis Date  . Bladder neck obstruction   . Cataracts, bilateral   . Chronic lymphocytic leukemia (CLL), B-cell (HCC)    Lambda positive B-cell CLL  . Gout   .  Hypercholesterolemia   . Hypertension   . Lymphocytosis   . Sleep apnea   . Thrombocytopenia ([place])     PAST SURGICAL HISTORY :   Past Surgical History:  Procedure Laterality Date  . COLONOSCOPY WITH PROPOFOL N/A 09/10/2015   Procedure: COLONOSCOPY WITH PROPOFOL;  Surgeon: Lucilla Lame, MD;  Location: [facility] ENDOSCOPY;  Service: Endoscopy;  Laterality: N/A;  . PROSTATE SURGERY  2008    FAMILY HISTORY :   Family History  Problem Relation Age of Onset  . Brain cancer Son 36    pt's only son  . Lung cancer Mother 60    deceased  . Polycythemia Brother     half brother  . Prostate cancer Brother     SOCIAL HISTORY:   Social History  Substance Use Topics  . Smoking status: Never Smoker  . Smokeless tobacco: Not on file  . Alcohol use No    ALLERGIES:  is allergic to penicillins.  MEDICATIONS:  Current Outpatient Prescriptions  Medication Sig Dispense Refill  . allopurinol (ZYLOPRIM) 100 MG tablet Take 100 mg by mouth 2 (two) times daily.    Marland Kitchen amLODipine (NORVASC) 10 MG tablet Take 5 mg by mouth daily. Patient takes a half of the 10 mg tablet to equal 5 mg every day    . lisinopril-hydrochlorothiazide (PRINZIDE,ZESTORETIC) 20-12.5 MG tablet Take 1 tablet by mouth daily.     No current facility-administered medications for this visit.     PHYSICAL EXAMINATION: ECOG PERFORMANCE STATUS: 0 - Asymptomatic  BP (!) 151/77 (BP Location: Right Arm, Patient Position: Sitting)   Pulse (!) 54   Temp (!) 96.8 F (36  C) (Tympanic)   Resp 18   Wt 236 lb 1.8 oz (107.1 kg)   BMI 35.90 kg/m   Filed Weights   10/06/16 1039  Weight: 236 lb 1.8 oz (107.1 kg)    GENERAL: Well-nourished well-developed; Alert, no distress and comfortable.   He is accompanied by his wife.  EYES: no pallor or icterus OROPHARYNX: no thrush or ulceration; good dentition  NECK: supple, no masses felt LYMPH:  no palpable lymphadenopathy in the cervical, axillary or inguinal regions LUNGS: clear to  auscultation and  No wheeze or crackles HEART/CVS: regular rate & rhythm and no murmurs; No lower extremity edema ABDOMEN:abdomen soft, non-tender and normal bowel sounds Musculoskeletal:no cyanosis of digits and no clubbing  PSYCH: alert & oriented x 3 with fluent speech NEURO: no focal motor/sensory deficits SKIN:  no rashes or significant lesions  LABORATORY DATA:  I have reviewed the data as listed    Component Value Date/Time   NA 131 (L) 10/06/2016 1004   NA 137 09/14/2014 0834   K 4.2 10/06/2016 1004   K 4.0 09/14/2014 0834   CL 95 (L) 10/06/2016 1004   CL 101 09/14/2014 0834   CO2 27 10/06/2016 1004   CO2 27 09/14/2014 0834   GLUCOSE 100 (H) 10/06/2016 1004   GLUCOSE 123 (H) 09/14/2014 0834   BUN 15 10/06/2016 1004   BUN 14 09/14/2014 0834   CREATININE 1.29 (H) 10/06/2016 1004   CREATININE 1.14 02/05/2015 0949   CALCIUM 8.9 10/06/2016 1004   CALCIUM 8.8 09/14/2014 0834   PROT 6.7 10/06/2016 1004   PROT 6.7 09/14/2014 0834   ALBUMIN 4.3 10/06/2016 1004   ALBUMIN 3.6 09/14/2014 0834   AST 19 10/06/2016 1004   AST 21 09/14/2014 0834   ALT 13 (L) 10/06/2016 1004   ALT 26 09/14/2014 0834   ALKPHOS 43 10/06/2016 1004   ALKPHOS 49 09/14/2014 0834   BILITOT 1.5 (H) 10/06/2016 1004   BILITOT 1.1 (H) 09/14/2014 0834   GFRNONAA 50 (L) 10/06/2016 1004   GFRNONAA 60 (L) 02/05/2015 0949   GFRAA 57 (L) 10/06/2016 1004   GFRAA >60 02/05/2015 0949    No results found for: SPEP, UPEP  Lab Results  Component Value Date   WBC 107.8 (HH) 10/06/2016   NEUTROABS 6.1 10/06/2016   HGB 15.7 10/06/2016   HCT 49.8 10/06/2016   MCV 94.5 10/06/2016   PLT 124 (L) 10/06/2016      Chemistry      Component Value Date/Time   NA 131 (L) 10/06/2016 1004   NA 137 09/14/2014 0834   K 4.2 10/06/2016 1004   K 4.0 09/14/2014 0834   CL 95 (L) 10/06/2016 1004   CL 101 09/14/2014 0834   CO2 27 10/06/2016 1004   CO2 27 09/14/2014 0834   BUN 15 10/06/2016 1004   BUN 14 09/14/2014 0834    CREATININE 1.29 (H) 10/06/2016 1004   CREATININE 1.14 02/05/2015 0949      Component Value Date/Time   CALCIUM 8.9 10/06/2016 1004   CALCIUM 8.8 09/14/2014 0834   ALKPHOS 43 10/06/2016 1004   ALKPHOS 49 09/14/2014 0834   AST 19 10/06/2016 1004   AST 21 09/14/2014 0834   ALT 13 (L) 10/06/2016 1004   ALT 26 09/14/2014 0834   BILITOT 1.5 (H) 10/06/2016 1004   BILITOT 1.1 (H) 09/14/2014 0834         ASSESSMENT & PLAN:   CLL (chronic lymphocytic leukemia) ([place]) # CLL Rai Stage I- patient is currently  asymptomatic; we will continue to monitor for now. Patient's white count is 107 today; . Hemoglobin stable and 15/platelets 126s-steady. 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   # Discussed  some of the new novel therapies for CLL / Again as patient is asymptomatic; withhold any treatment at this time.  # Enlarged prostate- s/p ? cryo Dr.Wolfe.   # Patient was given a copy of his labs/ will follow-up with me in 4 months with labs.   # [facility].      Cammie Sickle, MD 10/06/2016 10:56 AM

## 2016-10-06 NOTE — Progress Notes (Signed)
Here here for follow up today. Voices no concerns today.

## 2016-10-28 DIAGNOSIS — R972 Elevated prostate specific antigen [PSA]: Secondary | ICD-10-CM | POA: Diagnosis not present

## 2016-10-28 DIAGNOSIS — R351 Nocturia: Secondary | ICD-10-CM | POA: Diagnosis not present

## 2016-10-28 DIAGNOSIS — R350 Frequency of micturition: Secondary | ICD-10-CM | POA: Diagnosis not present

## 2016-10-28 DIAGNOSIS — N401 Enlarged prostate with lower urinary tract symptoms: Secondary | ICD-10-CM | POA: Diagnosis not present

## 2017-02-09 ENCOUNTER — Inpatient Hospital Stay: Payer: Medicare Other | Attending: Internal Medicine

## 2017-02-09 ENCOUNTER — Inpatient Hospital Stay (HOSPITAL_BASED_OUTPATIENT_CLINIC_OR_DEPARTMENT_OTHER): Payer: Medicare Other | Admitting: Internal Medicine

## 2017-02-09 ENCOUNTER — Other Ambulatory Visit: Payer: Self-pay | Admitting: *Deleted

## 2017-02-09 VITALS — BP 153/85 | HR 55 | Temp 97.6°F | Resp 18 | Ht 68.0 in | Wt 235.0 lb

## 2017-02-09 DIAGNOSIS — G4730 Sleep apnea, unspecified: Secondary | ICD-10-CM | POA: Diagnosis not present

## 2017-02-09 DIAGNOSIS — N183 Chronic kidney disease, stage 3 (moderate): Secondary | ICD-10-CM | POA: Diagnosis not present

## 2017-02-09 DIAGNOSIS — I129 Hypertensive chronic kidney disease with stage 1 through stage 4 chronic kidney disease, or unspecified chronic kidney disease: Secondary | ICD-10-CM | POA: Diagnosis not present

## 2017-02-09 DIAGNOSIS — C9110 Chronic lymphocytic leukemia of B-cell type not having achieved remission: Secondary | ICD-10-CM | POA: Diagnosis not present

## 2017-02-09 DIAGNOSIS — Z808 Family history of malignant neoplasm of other organs or systems: Secondary | ICD-10-CM

## 2017-02-09 DIAGNOSIS — Z801 Family history of malignant neoplasm of trachea, bronchus and lung: Secondary | ICD-10-CM | POA: Diagnosis not present

## 2017-02-09 DIAGNOSIS — N400 Enlarged prostate without lower urinary tract symptoms: Secondary | ICD-10-CM | POA: Insufficient documentation

## 2017-02-09 DIAGNOSIS — N320 Bladder-neck obstruction: Secondary | ICD-10-CM

## 2017-02-09 DIAGNOSIS — D696 Thrombocytopenia, unspecified: Secondary | ICD-10-CM | POA: Diagnosis not present

## 2017-02-09 DIAGNOSIS — Z8042 Family history of malignant neoplasm of prostate: Secondary | ICD-10-CM

## 2017-02-09 DIAGNOSIS — E7800 Pure hypercholesterolemia, unspecified: Secondary | ICD-10-CM

## 2017-02-09 LAB — CBC WITH DIFFERENTIAL/PLATELET
Basophils Absolute: 0.3 10*3/uL — ABNORMAL HIGH (ref 0–0.1)
Basophils Relative: 0 %
Eosinophils Absolute: 0.3 10*3/uL (ref 0–0.7)
Eosinophils Relative: 0 %
HEMATOCRIT: 49.3 % (ref 40.0–52.0)
HEMOGLOBIN: 15.3 g/dL (ref 13.0–18.0)
LYMPHS ABS: 109.6 10*3/uL — AB (ref 1.0–3.6)
LYMPHS PCT: 93 %
MCH: 29.1 pg (ref 26.0–34.0)
MCHC: 31.1 g/dL — AB (ref 32.0–36.0)
MCV: 93.8 fL (ref 80.0–100.0)
MONOS PCT: 2 %
Monocytes Absolute: 1.7 10*3/uL — ABNORMAL HIGH (ref 0.2–1.0)
NEUTROS ABS: 6.3 10*3/uL (ref 1.4–6.5)
NEUTROS PCT: 5 %
Platelets: 144 10*3/uL — ABNORMAL LOW (ref 150–440)
RBC: 5.26 MIL/uL (ref 4.40–5.90)
RDW: 15.6 % — ABNORMAL HIGH (ref 11.5–14.5)
WBC: 118.2 10*3/uL (ref 3.8–10.6)

## 2017-02-09 LAB — COMPREHENSIVE METABOLIC PANEL
ALBUMIN: 4.1 g/dL (ref 3.5–5.0)
ALK PHOS: 44 U/L (ref 38–126)
ALT: 17 U/L (ref 17–63)
ANION GAP: 5 (ref 5–15)
AST: 22 U/L (ref 15–41)
BILIRUBIN TOTAL: 0.8 mg/dL (ref 0.3–1.2)
BUN: 16 mg/dL (ref 6–20)
CALCIUM: 9 mg/dL (ref 8.9–10.3)
CO2: 27 mmol/L (ref 22–32)
Chloride: 101 mmol/L (ref 101–111)
Creatinine, Ser: 1.35 mg/dL — ABNORMAL HIGH (ref 0.61–1.24)
GFR calc non Af Amer: 47 mL/min — ABNORMAL LOW (ref >60)
GFR, EST AFRICAN AMERICAN: 54 mL/min — AB (ref >60)
GLUCOSE: 103 mg/dL — AB (ref 65–99)
Potassium: 4.2 mmol/L (ref 3.5–5.1)
Sodium: 133 mmol/L — ABNORMAL LOW (ref 135–145)
TOTAL PROTEIN: 6.5 g/dL (ref 6.5–8.1)

## 2017-02-09 LAB — LACTATE DEHYDROGENASE: LDH: 147 U/L (ref 98–192)

## 2017-02-09 NOTE — Progress Notes (Signed)
Patient here to f/u for h/o CLL. He has no medical complaints.

## 2017-02-09 NOTE — Assessment & Plan Note (Addendum)
#   CLL Rai Stage I- patient is currently asymptomatic; we will continue to monitor for now. Patient's white count is 118 [predominant lymphocytosis] today; . Hemoglobin stable at 15/platelets 144-steady. 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 Will draw  [place] at next visit.   # Discussed  some of the new novel therapies  [targeted therapies- Gazyva; ibrutinib ]for CLL / Again as patient is asymptomatic; withhold any treatment at this time.  # Enlarged prostate- s/p ? cryo Dr.Wolfe.   # CKD creat 1.5- stage III/ sec to HTN; dsicussed re: checking blood pressures at home;   # will follow-up with me in 4 months with labs; # offered to talk to daughter/ [place] [with [facility]].   # [facility].

## 2017-02-09 NOTE — Progress Notes (Signed)
[place] OFFICE PROGRESS NOTE   SUMMARY OF ONCOLOGIC HISTORY:  Oncology History   Dictating CKChief Complaint/Problem List:  Lambda positive B-cell CLL, RAI stage 0, lymphocytosis only. 10/08/06 CT of chest, abdomen and pelvis - No evidence of splenomegaly. No evidence of lymphadenopathy in the chest, abdomen, or pelvis. 10/04/06 flow cytometry of peripheral blood - A monoclonal population of lambda positive B-cells was detected. Phenotype consistent with diagnosis of B cell chronic lymphocytic leukemia/small lymphocytic lymphoma. CD38 positive. 03/06/08 CT Chest abdomen Pelvis:No findings to suggest recurrent disease. No adenopathy. 06/04/09: CT chest/abdomen/pelvis: No evidence of lymphadenopathy/splenomegaly. 06/04/10: ct chest/abdomen/pelvis: no evidence of active malignancy within the thorax or  abdomen or pelvis.  # CLL- 13q del; July 2017 IGVH ordered.   # Prostate Biopsy [PSA ~5; Dr.Wolfe]; June 2017;   # CKD [creat 1.5]     CLL (chronic lymphocytic leukemia) ([place])   10/29/2006 Initial Diagnosis    CLL (chronic lymphocytic leukemia)       INTERVAL HISTORY:  A very pleasant 83 year old male patient with above history of CLL currently monitored is here for follow-up/ accompanied by his wife.   He continues to deny any weight loss. Denies any new lumps or bumps. No sweats. No fevers. Denies any swelling in the legs.  REVIEW OF SYSTEMS:  A complete 10 point review of system is done which is negative except mentioned above/history of present illness.   PAST MEDICAL HISTORY :  Past Medical History:  Diagnosis Date  . Bladder neck obstruction   . Cataracts, bilateral   . Chronic lymphocytic leukemia (CLL), B-cell (HCC)    Lambda positive B-cell CLL  . Gout   . Hypercholesterolemia   . Hypertension   . Lymphocytosis   . Sleep apnea   . Thrombocytopenia ([place])     PAST SURGICAL HISTORY :   Past Surgical History:  Procedure Laterality Date  . COLONOSCOPY  WITH PROPOFOL N/A 09/10/2015   Procedure: COLONOSCOPY WITH PROPOFOL;  Surgeon: Lucilla Lame, MD;  Location: [facility] ENDOSCOPY;  Service: Endoscopy;  Laterality: N/A;  . PROSTATE SURGERY  2008    FAMILY HISTORY :   Family History  Problem Relation Age of Onset  . Brain cancer Son 45    pt's only son  . Lung cancer Mother 71    deceased  . Polycythemia Brother     half brother  . Prostate cancer Brother     SOCIAL HISTORY:   Social History  Substance Use Topics  . Smoking status: Never Smoker  . Smokeless tobacco: Not on file  . Alcohol use No    ALLERGIES:  is allergic to penicillins.  MEDICATIONS:  Current Outpatient Prescriptions  Medication Sig Dispense Refill  . allopurinol (ZYLOPRIM) 100 MG tablet Take 100 mg by mouth 2 (two) times daily.    Marland Kitchen amLODipine (NORVASC) 10 MG tablet Take 5 mg by mouth daily. Patient takes a half of the 10 mg tablet to equal 5 mg every day    . lisinopril-hydrochlorothiazide (PRINZIDE,ZESTORETIC) 20-12.5 MG tablet Take 1 tablet by mouth daily.     No current facility-administered medications for this visit.     PHYSICAL EXAMINATION: ECOG PERFORMANCE STATUS: 0 - Asymptomatic  BP (!) 153/85 (Patient Position: Sitting)   Pulse (!) 55   Temp 97.6 F (36.4 C) (Tympanic)   Resp 18   Ht 5\' 8"  (1.727 m)   Wt 235 lb (106.6 kg)   BMI 35.73 kg/m   Filed Weights   02/09/17 1051  Weight: 235 lb (106.6 kg)    GENERAL: Well-nourished well-developed; Alert, no distress and comfortable.   He is accompanied by his wife.  EYES: no pallor or icterus OROPHARYNX: no thrush or ulceration; good dentition  NECK: supple, no masses felt LYMPH:  no palpable lymphadenopathy in the cervical, axillary or inguinal regions LUNGS: clear to auscultation and  No wheeze or crackles HEART/CVS: regular rate & rhythm and no murmurs; No lower extremity edema ABDOMEN:abdomen soft, non-tender and normal bowel sounds Musculoskeletal:no cyanosis of digits and no clubbing   PSYCH: alert & oriented x 3 with fluent speech NEURO: no focal motor/sensory deficits SKIN:  no rashes or significant lesions  LABORATORY DATA:  I have reviewed the data as listed    Component Value Date/Time   NA 133 (L) 02/09/2017 1035   NA 137 09/14/2014 0834   K 4.2 02/09/2017 1035   K 4.0 09/14/2014 0834   CL 101 02/09/2017 1035   CL 101 09/14/2014 0834   CO2 27 02/09/2017 1035   CO2 27 09/14/2014 0834   GLUCOSE 103 (H) 02/09/2017 1035   GLUCOSE 123 (H) 09/14/2014 0834   BUN 16 02/09/2017 1035   BUN 14 09/14/2014 0834   CREATININE 1.35 (H) 02/09/2017 1035   CREATININE 1.14 02/05/2015 0949   CALCIUM 9.0 02/09/2017 1035   CALCIUM 8.8 09/14/2014 0834   PROT 6.5 02/09/2017 1035   PROT 6.7 09/14/2014 0834   ALBUMIN 4.1 02/09/2017 1035   ALBUMIN 3.6 09/14/2014 0834   AST 22 02/09/2017 1035   AST 21 09/14/2014 0834   ALT 17 02/09/2017 1035   ALT 26 09/14/2014 0834   ALKPHOS 44 02/09/2017 1035   ALKPHOS 49 09/14/2014 0834   BILITOT 0.8 02/09/2017 1035   BILITOT 1.1 (H) 09/14/2014 0834   GFRNONAA 47 (L) 02/09/2017 1035   GFRNONAA 60 (L) 02/05/2015 0949   GFRAA 54 (L) 02/09/2017 1035   GFRAA >60 02/05/2015 0949    No results found for: SPEP, UPEP  Lab Results  Component Value Date   WBC 118.2 (HH) 02/09/2017   NEUTROABS 6.3 02/09/2017   HGB 15.3 02/09/2017   HCT 49.3 02/09/2017   MCV 93.8 02/09/2017   PLT 144 (L) 02/09/2017      Chemistry      Component Value Date/Time   NA 133 (L) 02/09/2017 1035   NA 137 09/14/2014 0834   K 4.2 02/09/2017 1035   K 4.0 09/14/2014 0834   CL 101 02/09/2017 1035   CL 101 09/14/2014 0834   CO2 27 02/09/2017 1035   CO2 27 09/14/2014 0834   BUN 16 02/09/2017 1035   BUN 14 09/14/2014 0834   CREATININE 1.35 (H) 02/09/2017 1035   CREATININE 1.14 02/05/2015 0949      Component Value Date/Time   CALCIUM 9.0 02/09/2017 1035   CALCIUM 8.8 09/14/2014 0834   ALKPHOS 44 02/09/2017 1035   ALKPHOS 49 09/14/2014 0834   AST 22  02/09/2017 1035   AST 21 09/14/2014 0834   ALT 17 02/09/2017 1035   ALT 26 09/14/2014 0834   BILITOT 0.8 02/09/2017 1035   BILITOT 1.1 (H) 09/14/2014 0834         ASSESSMENT & PLAN:   CLL (chronic lymphocytic leukemia) ([place]) # CLL Rai Stage I- patient is currently asymptomatic; we will continue to monitor for now. Patient's white count is 118 [predominant lymphocytosis] today; . Hemoglobin stable at 15/platelets 144-steady. 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 Will draw  [place] at next visit.   #  Discussed  some of the new novel therapies  [targeted therapies- Gazyva; ibrutinib ]for CLL / Again as patient is asymptomatic; withhold any treatment at this time.  # Enlarged prostate- s/p ? cryo Dr.Wolfe.   # CKD creat 1.5- stage III/ sec to HTN; dsicussed re: checking blood pressures at home;   # will follow-up with me in 4 months with labs; # offered to talk to daughter/ [place] [with [employer] [facility]].   # [facility].      Cammie Sickle, MD 02/09/2017 11:24 AM

## 2017-04-20 DIAGNOSIS — H43813 Vitreous degeneration, bilateral: Secondary | ICD-10-CM | POA: Diagnosis not present

## 2017-04-29 DIAGNOSIS — N401 Enlarged prostate with lower urinary tract symptoms: Secondary | ICD-10-CM | POA: Diagnosis not present

## 2017-04-29 DIAGNOSIS — R972 Elevated prostate specific antigen [PSA]: Secondary | ICD-10-CM | POA: Diagnosis not present

## 2017-06-15 ENCOUNTER — Inpatient Hospital Stay: Payer: Medicare Other | Attending: Internal Medicine | Admitting: Internal Medicine

## 2017-06-15 ENCOUNTER — Inpatient Hospital Stay: Payer: Medicare Other

## 2017-06-15 VITALS — BP 131/77 | HR 57 | Temp 96.3°F | Resp 16 | Wt 227.0 lb

## 2017-06-15 DIAGNOSIS — E7800 Pure hypercholesterolemia, unspecified: Secondary | ICD-10-CM

## 2017-06-15 DIAGNOSIS — N320 Bladder-neck obstruction: Secondary | ICD-10-CM | POA: Diagnosis not present

## 2017-06-15 DIAGNOSIS — Z801 Family history of malignant neoplasm of trachea, bronchus and lung: Secondary | ICD-10-CM | POA: Diagnosis not present

## 2017-06-15 DIAGNOSIS — C9110 Chronic lymphocytic leukemia of B-cell type not having achieved remission: Secondary | ICD-10-CM

## 2017-06-15 DIAGNOSIS — I129 Hypertensive chronic kidney disease with stage 1 through stage 4 chronic kidney disease, or unspecified chronic kidney disease: Secondary | ICD-10-CM | POA: Diagnosis not present

## 2017-06-15 DIAGNOSIS — N183 Chronic kidney disease, stage 3 (moderate): Secondary | ICD-10-CM | POA: Diagnosis not present

## 2017-06-15 DIAGNOSIS — Z8042 Family history of malignant neoplasm of prostate: Secondary | ICD-10-CM | POA: Diagnosis not present

## 2017-06-15 DIAGNOSIS — Z808 Family history of malignant neoplasm of other organs or systems: Secondary | ICD-10-CM

## 2017-06-15 DIAGNOSIS — R634 Abnormal weight loss: Secondary | ICD-10-CM | POA: Diagnosis not present

## 2017-06-15 DIAGNOSIS — C9190 Lymphoid leukemia, unspecified not having achieved remission: Secondary | ICD-10-CM | POA: Diagnosis not present

## 2017-06-15 DIAGNOSIS — G4730 Sleep apnea, unspecified: Secondary | ICD-10-CM | POA: Diagnosis not present

## 2017-06-15 DIAGNOSIS — M109 Gout, unspecified: Secondary | ICD-10-CM | POA: Diagnosis not present

## 2017-06-15 LAB — CBC WITH DIFFERENTIAL/PLATELET
BASOS ABS: 0.4 10*3/uL — AB (ref 0–0.1)
Basophils Relative: 0 %
EOS PCT: 0 %
Eosinophils Absolute: 0.2 10*3/uL (ref 0–0.7)
HCT: 48.7 % (ref 40.0–52.0)
Hemoglobin: 15.3 g/dL (ref 13.0–18.0)
LYMPHS ABS: 124.7 10*3/uL — AB (ref 1.0–3.6)
LYMPHS PCT: 93 %
MCH: 29.7 pg (ref 26.0–34.0)
MCHC: 31.5 g/dL — AB (ref 32.0–36.0)
MCV: 94.5 fL (ref 80.0–100.0)
MONO ABS: 2.5 10*3/uL — AB (ref 0.2–1.0)
Monocytes Relative: 2 %
Neutro Abs: 6.3 10*3/uL (ref 1.4–6.5)
Neutrophils Relative %: 5 %
PLATELETS: 146 10*3/uL — AB (ref 150–440)
RBC: 5.15 MIL/uL (ref 4.40–5.90)
RDW: 15.7 % — AB (ref 11.5–14.5)
WBC: 134.1 10*3/uL (ref 3.8–10.6)

## 2017-06-15 LAB — COMPREHENSIVE METABOLIC PANEL
ALBUMIN: 4.2 g/dL (ref 3.5–5.0)
ALK PHOS: 41 U/L (ref 38–126)
ALT: 17 U/L (ref 17–63)
ANION GAP: 8 (ref 5–15)
AST: 30 U/L (ref 15–41)
BILIRUBIN TOTAL: 1 mg/dL (ref 0.3–1.2)
BUN: 13 mg/dL (ref 6–20)
CALCIUM: 9.2 mg/dL (ref 8.9–10.3)
CO2: 26 mmol/L (ref 22–32)
Chloride: 99 mmol/L — ABNORMAL LOW (ref 101–111)
Creatinine, Ser: 1.29 mg/dL — ABNORMAL HIGH (ref 0.61–1.24)
GFR calc Af Amer: 57 mL/min — ABNORMAL LOW (ref >60)
GFR, EST NON AFRICAN AMERICAN: 49 mL/min — AB (ref >60)
GLUCOSE: 123 mg/dL — AB (ref 65–99)
POTASSIUM: 4.6 mmol/L (ref 3.5–5.1)
Sodium: 133 mmol/L — ABNORMAL LOW (ref 135–145)
TOTAL PROTEIN: 6.8 g/dL (ref 6.5–8.1)

## 2017-06-15 LAB — LACTATE DEHYDROGENASE: LDH: 139 U/L (ref 98–192)

## 2017-06-15 NOTE — Progress Notes (Signed)
[place] OFFICE PROGRESS NOTE   SUMMARY OF ONCOLOGIC HISTORY:  Oncology History   3 Lambda positive B-cell CLL, RAI stage 0, lymphocytosis only. 10/08/06 CT of chest, abdomen and pelvis - No evidence of splenomegaly. No evidence of lymphadenopathy in the chest, abdomen, or pelvis. 10/04/06 flow cytometry of peripheral blood - A monoclonal population of lambda positive B-cells was detected. Phenotype consistent with diagnosis of B cell chronic lymphocytic leukemia/small lymphocytic lymphoma. CD38 positive. 03/06/08 CT Chest abdomen Pelvis:No findings to suggest recurrent disease. No adenopathy. 06/04/09: CT chest/abdomen/pelvis: No evidence of lymphadenopathy/splenomegaly. 06/04/10: ct chest/abdomen/pelvis: no evidence of active malignancy within the thorax or  abdomen or pelvis.  # CLL- 13q del; July 2017 IGVH ordered.   # Prostate Biopsy [PSA ~5; Dr.Wolfe]; June 2017;   # CKD [creat 1.5]     CLL (chronic lymphocytic leukemia) ([place])    INTERVAL HISTORY:  A very pleasant 83 year old male patient with above history of CLL currently monitored is here for follow-up/ accompanied by his wife.   Patient states that he has lost about 10 pounds since his last visit. However this is intentional. Denies any early satiety. Denies any shortness of breath or cough or fevers or infections. No hospitalizations. Denies any new lumps or bumps. No sweats. No fevers. Denies any swelling in the legs.  REVIEW OF SYSTEMS:  A complete 10 point review of system is done which is negative except mentioned above/history of present illness.   PAST MEDICAL HISTORY :  Past Medical History:  Diagnosis Date  . Bladder neck obstruction   . Cataracts, bilateral   . Chronic lymphocytic leukemia (CLL), B-cell (HCC)    Lambda positive B-cell CLL  . Gout   . Hypercholesterolemia   . Hypertension   . Lymphocytosis   . Sleep apnea   . Thrombocytopenia ([place])     PAST SURGICAL HISTORY :   Past  Surgical History:  Procedure Laterality Date  . COLONOSCOPY WITH PROPOFOL N/A 09/10/2015   Procedure: COLONOSCOPY WITH PROPOFOL;  Surgeon: Lucilla Lame, MD;  Location: [facility] ENDOSCOPY;  Service: Endoscopy;  Laterality: N/A;  . PROSTATE SURGERY  2008    FAMILY HISTORY :   Family History  Problem Relation Age of Onset  . Brain cancer Son 82       pt's only son  . Lung cancer Mother 46       deceased  . Polycythemia Brother        half brother  . Prostate cancer Brother     SOCIAL HISTORY:   Social History  Substance Use Topics  . Smoking status: Never Smoker  . Smokeless tobacco: Not on file  . Alcohol use No    ALLERGIES:  is allergic to penicillins.  MEDICATIONS:  Current Outpatient Prescriptions  Medication Sig Dispense Refill  . allopurinol (ZYLOPRIM) 100 MG tablet Take 100 mg by mouth 2 (two) times daily.    Marland Kitchen amLODipine (NORVASC) 10 MG tablet Take 5 mg by mouth daily. Patient takes a half of the 10 mg tablet to equal 5 mg every day    . lisinopril-hydrochlorothiazide (PRINZIDE,ZESTORETIC) 20-12.5 MG tablet Take 1 tablet by mouth daily.     No current facility-administered medications for this visit.     PHYSICAL EXAMINATION: ECOG PERFORMANCE STATUS: 0 - Asymptomatic  BP 131/77 (BP Location: Left Arm)   Pulse (!) 57   Temp (!) 96.3 F (35.7 C) (Tympanic)   Resp 16   Wt 226 lb 15.4 oz (103 kg)  BMI 34.51 kg/m   Filed Weights   06/15/17 1024 06/15/17 1025  Weight: 226 lb 15.4 oz (103 kg) 226 lb 15.4 oz (103 kg)    GENERAL: Well-nourished well-developed; Alert, no distress and comfortable.   He is accompanied by his wife.  EYES: no pallor or icterus OROPHARYNX: no thrush or ulceration; good dentition  NECK: supple, no masses felt LYMPH:  no palpable lymphadenopathy in the cervical, axillary or inguinal regions LUNGS: clear to auscultation and  No wheeze or crackles HEART/CVS: regular rate & rhythm and no murmurs; No lower extremity edema ABDOMEN:abdomen  soft, non-tender and normal bowel sounds Musculoskeletal:no cyanosis of digits and no clubbing  PSYCH: alert & oriented x 3 with fluent speech NEURO: no focal motor/sensory deficits SKIN:  no rashes or significant lesions  LABORATORY DATA:  I have reviewed the data as listed    Component Value Date/Time   NA 133 (L) 06/15/2017 0954   NA 137 09/14/2014 0834   K 4.6 06/15/2017 0954   K 4.0 09/14/2014 0834   CL 99 (L) 06/15/2017 0954   CL 101 09/14/2014 0834   CO2 26 06/15/2017 0954   CO2 27 09/14/2014 0834   GLUCOSE 123 (H) 06/15/2017 0954   GLUCOSE 123 (H) 09/14/2014 0834   BUN 13 06/15/2017 0954   BUN 14 09/14/2014 0834   CREATININE 1.29 (H) 06/15/2017 0954   CREATININE 1.14 02/05/2015 0949   CALCIUM 9.2 06/15/2017 0954   CALCIUM 8.8 09/14/2014 0834   PROT 6.8 06/15/2017 0954   PROT 6.7 09/14/2014 0834   ALBUMIN 4.2 06/15/2017 0954   ALBUMIN 3.6 09/14/2014 0834   AST 30 06/15/2017 0954   AST 21 09/14/2014 0834   ALT 17 06/15/2017 0954   ALT 26 09/14/2014 0834   ALKPHOS 41 06/15/2017 0954   ALKPHOS 49 09/14/2014 0834   BILITOT 1.0 06/15/2017 0954   BILITOT 1.1 (H) 09/14/2014 0834   GFRNONAA 49 (L) 06/15/2017 0954   GFRNONAA 60 (L) 02/05/2015 0949   GFRAA 57 (L) 06/15/2017 0954   GFRAA >60 02/05/2015 0949    No results found for: SPEP, UPEP  Lab Results  Component Value Date   WBC 134.1 (HH) 06/15/2017   NEUTROABS 6.3 06/15/2017   HGB 15.3 06/15/2017   HCT 48.7 06/15/2017   MCV 94.5 06/15/2017   PLT 146 (L) 06/15/2017      Chemistry      Component Value Date/Time   NA 133 (L) 06/15/2017 0954   NA 137 09/14/2014 0834   K 4.6 06/15/2017 0954   K 4.0 09/14/2014 0834   CL 99 (L) 06/15/2017 0954   CL 101 09/14/2014 0834   CO2 26 06/15/2017 0954   CO2 27 09/14/2014 0834   BUN 13 06/15/2017 0954   BUN 14 09/14/2014 0834   CREATININE 1.29 (H) 06/15/2017 0954   CREATININE 1.14 02/05/2015 0949      Component Value Date/Time   CALCIUM 9.2 06/15/2017 0954    CALCIUM 8.8 09/14/2014 0834   ALKPHOS 41 06/15/2017 0954   ALKPHOS 49 09/14/2014 0834   AST 30 06/15/2017 0954   AST 21 09/14/2014 0834   ALT 17 06/15/2017 0954   ALT 26 09/14/2014 0834   BILITOT 1.0 06/15/2017 0954   BILITOT 1.1 (H) 09/14/2014 0834         ASSESSMENT & PLAN:   CLL (chronic lymphocytic leukemia) ([place]) # CLL Rai Stage I- patient is currently asymptomatic. Patient's white count is 134 [predominant lymphocytosis] today; . Hemoglobin stable at  15/platelets 144-steady. 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 Awaiting I GVH mutation status/drawn today.  #I again reviewed/discussed the new novel therapies  [targeted therapies- Gazyva; ibrutinib/venotoclax ]for CLL. Again as patient is asymptomatic; withhold any treatment at this time.   # weight loss-about 10-15 pounds; that seems to be intentional. This is not associated with B symptoms. Again educated the patient regarding the alarming symptoms of progressive CLL needing treatment.   # Enlarged prostate- s/p ? cryo Dr.Wolfe.   # CKD creat 1.2-1.5- stage III/ sec to HTN ; stable.   # will follow-up with me in 4 months with labs;   # [facility]      Cammie Sickle, MD 06/15/2017 9:29 PM

## 2017-06-15 NOTE — Assessment & Plan Note (Addendum)
#   CLL Rai Stage I- patient is currently asymptomatic. Patient's white count is 134 [predominant lymphocytosis] today; . Hemoglobin stable at 15/platelets 144-steady. 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 Awaiting I GVH mutation status/drawn today.  #I again reviewed/discussed the new novel therapies  [targeted therapies- Gazyva; ibrutinib/venotoclax ]for CLL. Again as patient is asymptomatic; withhold any treatment at this time.   # weight loss-about 10-15 pounds; that seems to be intentional. This is not associated with B symptoms. Again educated the patient regarding the alarming symptoms of progressive CLL needing treatment.   # Enlarged prostate- s/p ? cryo Dr.Wolfe.   # CKD creat 1.2-1.5- stage III/ sec to HTN ; stable.   # will follow-up with me in 4 months with labs;   # [facility]

## 2017-06-29 LAB — MISC LABCORP TEST (SEND OUT): LABCORP TEST CODE: 113753

## 2017-07-27 ENCOUNTER — Telehealth: Payer: Self-pay | Admitting: *Deleted

## 2017-07-27 ENCOUNTER — Inpatient Hospital Stay: Payer: Medicare Other | Attending: Internal Medicine | Admitting: Internal Medicine

## 2017-07-27 ENCOUNTER — Encounter: Payer: Self-pay | Admitting: Internal Medicine

## 2017-07-27 ENCOUNTER — Other Ambulatory Visit: Payer: Self-pay

## 2017-07-27 ENCOUNTER — Inpatient Hospital Stay: Payer: Medicare Other

## 2017-07-27 VITALS — BP 133/55 | HR 52 | Temp 97.2°F | Resp 20 | Ht 68.0 in | Wt 219.4 lb

## 2017-07-27 DIAGNOSIS — N183 Chronic kidney disease, stage 3 (moderate): Secondary | ICD-10-CM | POA: Insufficient documentation

## 2017-07-27 DIAGNOSIS — Z794 Long term (current) use of insulin: Secondary | ICD-10-CM | POA: Diagnosis not present

## 2017-07-27 DIAGNOSIS — Z801 Family history of malignant neoplasm of trachea, bronchus and lung: Secondary | ICD-10-CM | POA: Diagnosis not present

## 2017-07-27 DIAGNOSIS — N320 Bladder-neck obstruction: Secondary | ICD-10-CM | POA: Diagnosis not present

## 2017-07-27 DIAGNOSIS — D696 Thrombocytopenia, unspecified: Secondary | ICD-10-CM | POA: Diagnosis not present

## 2017-07-27 DIAGNOSIS — R634 Abnormal weight loss: Secondary | ICD-10-CM | POA: Insufficient documentation

## 2017-07-27 DIAGNOSIS — E7800 Pure hypercholesterolemia, unspecified: Secondary | ICD-10-CM | POA: Insufficient documentation

## 2017-07-27 DIAGNOSIS — G4730 Sleep apnea, unspecified: Secondary | ICD-10-CM | POA: Diagnosis not present

## 2017-07-27 DIAGNOSIS — C9110 Chronic lymphocytic leukemia of B-cell type not having achieved remission: Secondary | ICD-10-CM

## 2017-07-27 DIAGNOSIS — C9190 Lymphoid leukemia, unspecified not having achieved remission: Secondary | ICD-10-CM | POA: Diagnosis not present

## 2017-07-27 DIAGNOSIS — Z808 Family history of malignant neoplasm of other organs or systems: Secondary | ICD-10-CM | POA: Diagnosis not present

## 2017-07-27 DIAGNOSIS — R6881 Early satiety: Secondary | ICD-10-CM | POA: Diagnosis not present

## 2017-07-27 DIAGNOSIS — Z7984 Long term (current) use of oral hypoglycemic drugs: Secondary | ICD-10-CM | POA: Insufficient documentation

## 2017-07-27 DIAGNOSIS — M109 Gout, unspecified: Secondary | ICD-10-CM | POA: Diagnosis not present

## 2017-07-27 DIAGNOSIS — I129 Hypertensive chronic kidney disease with stage 1 through stage 4 chronic kidney disease, or unspecified chronic kidney disease: Secondary | ICD-10-CM | POA: Insufficient documentation

## 2017-07-27 DIAGNOSIS — R161 Splenomegaly, not elsewhere classified: Secondary | ICD-10-CM | POA: Insufficient documentation

## 2017-07-27 DIAGNOSIS — Z79899 Other long term (current) drug therapy: Secondary | ICD-10-CM | POA: Insufficient documentation

## 2017-07-27 LAB — CBC WITH DIFFERENTIAL/PLATELET
BASOS ABS: 0.3 10*3/uL — AB (ref 0–0.1)
BASOS PCT: 0 %
EOS ABS: 0.1 10*3/uL (ref 0–0.7)
EOS PCT: 0 %
HEMATOCRIT: 48.6 % (ref 40.0–52.0)
Hemoglobin: 15.5 g/dL (ref 13.0–18.0)
Lymphocytes Relative: 93 %
Lymphs Abs: 100.4 10*3/uL — ABNORMAL HIGH (ref 1.0–3.6)
MCH: 30.1 pg (ref 26.0–34.0)
MCHC: 31.8 g/dL — ABNORMAL LOW (ref 32.0–36.0)
MCV: 94.6 fL (ref 80.0–100.0)
Monocytes Absolute: 1.5 10*3/uL — ABNORMAL HIGH (ref 0.2–1.0)
Monocytes Relative: 1 %
Neutro Abs: 6.4 10*3/uL (ref 1.4–6.5)
Neutrophils Relative %: 6 %
PLATELETS: 141 10*3/uL — AB (ref 150–440)
RBC: 5.14 MIL/uL (ref 4.40–5.90)
RDW: 15.9 % — AB (ref 11.5–14.5)
WBC: 108.8 10*3/uL — AB (ref 3.8–10.6)

## 2017-07-27 LAB — COMPREHENSIVE METABOLIC PANEL
ALBUMIN: 4.2 g/dL (ref 3.5–5.0)
ALT: 17 U/L (ref 17–63)
AST: 23 U/L (ref 15–41)
Alkaline Phosphatase: 41 U/L (ref 38–126)
Anion gap: 7 (ref 5–15)
BUN: 13 mg/dL (ref 6–20)
CHLORIDE: 100 mmol/L — AB (ref 101–111)
CO2: 27 mmol/L (ref 22–32)
Calcium: 9.1 mg/dL (ref 8.9–10.3)
Creatinine, Ser: 1.19 mg/dL (ref 0.61–1.24)
GFR calc Af Amer: >60 mL/min (ref >60)
GFR calc non Af Amer: 54 mL/min — ABNORMAL LOW (ref >60)
GLUCOSE: 109 mg/dL — AB (ref 65–99)
POTASSIUM: 4.5 mmol/L (ref 3.5–5.1)
Sodium: 134 mmol/L — ABNORMAL LOW (ref 135–145)
Total Bilirubin: 1.4 mg/dL — ABNORMAL HIGH (ref 0.3–1.2)
Total Protein: 6.7 g/dL (ref 6.5–8.1)

## 2017-07-27 LAB — LACTATE DEHYDROGENASE: LDH: 139 U/L (ref 98–192)

## 2017-07-27 MED ORDER — SULFAMETHOXAZOLE-TRIMETHOPRIM 800-160 MG PO TABS: 1.0000 | ORAL_TABLET | ORAL | 0 refills | Status: DC

## 2017-07-27 MED ORDER — ONDANSETRON HCL 8 MG PO TABS: ORAL_TABLET | ORAL | 1 refills | Status: DC

## 2017-07-27 MED ORDER — PROCHLORPERAZINE MALEATE 10 MG PO TABS: 10.0000 mg | ORAL_TABLET | Freq: Four times a day (QID) | ORAL | 1 refills | Status: DC | PRN

## 2017-07-27 MED ORDER — ACYCLOVIR 400 MG PO TABS: ORAL_TABLET | ORAL | 3 refills | Status: DC

## 2017-07-27 MED ORDER — CHLORAMBUCIL 2 MG PO TABS: 0.5000 mg/kg | ORAL_TABLET | ORAL | 5 refills | Status: DC

## 2017-07-27 NOTE — Progress Notes (Signed)
START ON PATHWAY REGIMEN - Lymphoma and CLL     A cycle is every 28 days:     Obinutuzumab      Obinutuzumab      Obinutuzumab      Obinutuzumab      Chlorambucil   **Always confirm dose/schedule in your pharmacy ordering system**    Patient Characteristics: Chronic Lymphocytic Leukemia (CLL), First Line, Treatment Indicated, 17p del (-), Frail Patient Disease Type: Chronic Lymphocytic Leukemia (CLL) Disease Type: Not Applicable Line of therapy: First Line RAI Stage: IV Treatment Indicated<= Treatment Indicated 17p Deletion Status: Negative Fit or Frail Patient<= Frail Patient Intent of Therapy: Non-Curative / Palliative Intent, Discussed with Patient 

## 2017-07-27 NOTE — Progress Notes (Signed)
Patient here today for acute add on/walk in.  Patient reports new onset of left groin pain x 2 days. Currently denies pain when sitting down. However, pt reports that when he raises his leg or turns over in the bed, there is a shooting pain in left groin. He also reports night sweats that occurred 2 days ago. "I had to change my entire bedtime clothing."

## 2017-07-27 NOTE — Assessment & Plan Note (Addendum)
#   CLL Rai 13q del. Today her white count is 108 [predominant lymphocytosis]; hemoglobin 15 platelets 146 stable. However patient symptomatic from night sweats/weight loss/ and also early satiety abdominal pain from splenomegaly.  # Again reviewed the options of treatment including- ibrutinib vs. Gazyva+ chlorambucil.  Discussed the pros and cons of each treatment option; since Gazyva+ chlorambucil. I discussed the concerns of infusion reactions; risk of infections- including shingles hepatitis B etc. Check hepatitis panel.  # Discussed regarding- infectious prophylaxis; recommend acyclovir prophylaxis; Bactrim prophylaxis. Prescriptions given.  # Enlarged prostate- s/p ? cryo Dr.Wolfe.   # CKD creat 1.2-1.5- stage III/ sec to HTN ; stable.   # follow-up in approximately 1 week; Gazyva infusion/[place].   Addedndum: Spoke to pt's daughter- Izora Gala re: the plan of care. The potential issues of the reactions; other potential side effects. She is in agreement. Discussed with Alysson, the pharmacist patient does not have coverage for oral medication. We will HOLD off Chlorambucil.

## 2017-07-27 NOTE — Telephone Encounter (Signed)
Daughter who is not on his Release of Information called asking that we return her call to find out what his condition is after his appointment today. I will not call her back

## 2017-07-27 NOTE — Telephone Encounter (Signed)
Brent Hoover- please check with pt- if it is okay to speak to her daughter; and if it is okay to speak with the daughter-  get the daughter's phone number from the patient- I will speak to the daughter.

## 2017-07-27 NOTE — Patient Instructions (Signed)
Obinutuzumab injection What is this medicine? OBINUTUZUMAB (OH bi nue TOOZ ue mab) is a monoclonal antibody. It is used to treat chronic lymphocytic leukemia (CLL) and a type of non-Hodgkin lymphoma (NHL), follicular lymphoma. This medicine may be used for other purposes; ask your health care provider or pharmacist if you have questions. COMMON BRAND NAME(S): GAZYVA What should I tell my health care provider before I take this medicine? They need to know if you have any of these conditions: -infection (especially a virus infection such as hepatitis B virus) -lung or breathing disease -heart disease -take medicines that treat or prevent blood clots -an unusual or allergic reaction to obinutuzumab, other medicines, foods, dyes, or preservatives -pregnant or trying to get pregnant -breast-feeding How should I use this medicine? This medicine is for infusion into a vein. It is given by a health care professional in a hospital or clinic setting. Talk to your pediatrician regarding the use of this medicine in children. Special care may be needed. Overdosage: If you think you have taken too much of this medicine contact a poison control center or emergency room at once. NOTE: This medicine is only for you. Do not share this medicine with others. What if I miss a dose? Keep appointments for follow-up doses as directed. It is important not to miss your dose. Call your doctor or health care professional if you are unable to keep an appointment. What may interact with this medicine? -live virus vaccines This list may not describe all possible interactions. Give your health care provider a list of all the medicines, herbs, non-prescription drugs, or dietary supplements you use. Also tell them if you smoke, drink alcohol, or use illegal drugs. Some items may interact with your medicine. What should I watch for while using this medicine? Report any side effects that you notice during your treatment right  away, such as changes in your breathing, fever, chills, dizziness or lightheadedness. These effects are more common with the first dose. Visit your prescriber or health care professional for checks on your progress. You will need to have regular blood work. Report any other side effects. The side effects of this medicine can continue after you finish your treatment. Continue your course of treatment even though you feel ill unless your doctor tells you to stop. Call your doctor or health care professional for advice if you get a fever, chills or sore throat, or other symptoms of a cold or flu. Do not treat yourself. This drug decreases your body's ability to fight infections. Try to avoid being around people who are sick. This medicine may increase your risk to bruise or bleed. Call your doctor or health care professional if you notice any unusual bleeding. What side effects may I notice from receiving this medicine? Side effects that you should report to your doctor or health care professional as soon as possible: -allergic reactions like skin rash, itching or hives, swelling of the face, lips, or tongue -breathing problems -changes in vision -chest pain or chest tightness -confusion -dizziness -loss of balance or coordination -low blood counts - this medicine may decrease the number of white blood cells, red blood cells and platelets. You may be at increased risk for infections and bleeding. -signs of decreased platelets or bleeding - bruising, pinpoint red spots on the skin, black, tarry stools, blood in the urine -signs of infection - fever or chills, cough, sore throat, pain or trouble passing urine -signs and symptoms of liver injury like dark yellow or   brown urine; general ill feeling or flu-like symptoms; light-colored stools; loss of appetite; nausea; right upper belly pain; unusually weak or tired; yellowing of the eyes or skin -trouble speaking or understanding -trouble  walking -vomiting Side effects that usually do not require medical attention (report to your doctor or health care professional if they continue or are bothersome): -constipation -joint pain -muscle pain This list may not describe all possible side effects. Call your doctor for medical advice about side effects. You may report side effects to FDA at 1-800-FDA-1088. Where should I keep my medicine? This drug is only given in a hospital or clinic and will not be stored at home. NOTE: This sheet is a summary. It may not cover all possible information. If you have questions about this medicine, talk to your doctor, pharmacist, or health care provider.  2018 Elsevier/Gold Standard (2015-11-21 08:54:03)  

## 2017-07-27 NOTE — Progress Notes (Signed)
[place] OFFICE PROGRESS NOTE   SUMMARY OF ONCOLOGIC HISTORY:  Oncology History   3 Lambda positive B-cell CLL, RAI stage 0, lymphocytosis only. 10/08/06 CT of chest, abdomen and pelvis - No evidence of splenomegaly. No evidence of lymphadenopathy in the chest, abdomen, or pelvis. 10/04/06 flow cytometry of peripheral blood - A monoclonal population of lambda positive B-cells was detected. Phenotype consistent with diagnosis of B cell chronic lymphocytic leukemia/small lymphocytic lymphoma. CD38 positive. 03/06/08 CT Chest abdomen Pelvis:No findings to suggest recurrent disease. No adenopathy. 06/04/09: CT chest/abdomen/pelvis: No evidence of lymphadenopathy/splenomegaly. 06/04/10: ct chest/abdomen/pelvis: no evidence of active malignancy within the thorax or  abdomen or pelvis.  # CLL- 13q del; July 2017 IGVH ordered.   # Prostate Biopsy [PSA ~5; Dr.Wolfe]; June 2017;   # CKD [creat 1.5]     CLL (chronic lymphocytic leukemia) ([place])    INTERVAL HISTORY:  A very pleasant 83 year old male patient with above history of CLL currently monitored is here for follow-up/ accompanied by his wife.   Patient states that he has lost about 30-40 pounds in the last 6 months [which seems to be more than expected as he has been trying to lose weight], admits to early satiety also discomfort in the left lower quadrant abdomen. He had an episode of drenching night sweats 1 approximately 4 days ago. Denies any shortness of breath or cough or fevers or infections. No hospitalizations. Denies any new lumps or bumps. No sweats. No fevers. Denies any swelling in the legs.  REVIEW OF SYSTEMS:  A complete 10 point review of system is done which is negative except mentioned above/history of present illness.   PAST MEDICAL HISTORY :  Past Medical History:  Diagnosis Date  . Bladder neck obstruction   . Cataracts, bilateral   . Chronic lymphocytic leukemia (CLL), B-cell (HCC)    Lambda positive  B-cell CLL  . Gout   . Hypercholesterolemia   . Hypertension   . Lymphocytosis   . Sleep apnea   . Thrombocytopenia ([place])     PAST SURGICAL HISTORY :   Past Surgical History:  Procedure Laterality Date  . COLONOSCOPY WITH PROPOFOL N/A 09/10/2015   Procedure: COLONOSCOPY WITH PROPOFOL;  Surgeon: Lucilla Lame, MD;  Location: [facility] ENDOSCOPY;  Service: Endoscopy;  Laterality: N/A;  . PROSTATE SURGERY  2008    FAMILY HISTORY :   Family History  Problem Relation Age of Onset  . Lung cancer Mother 3       deceased  . Brain cancer Son 55       pt's only son  . Polycythemia Brother        half brother  . Prostate cancer Brother     SOCIAL HISTORY:   Social History  Substance Use Topics  . Smoking status: Never Smoker  . Smokeless tobacco: Not on file  . Alcohol use No    ALLERGIES:  is allergic to penicillins.  MEDICATIONS:  Current Outpatient Prescriptions  Medication Sig Dispense Refill  . allopurinol (ZYLOPRIM) 100 MG tablet Take 200 mg by mouth daily.     Marland Kitchen amLODipine (NORVASC) 10 MG tablet Take 5 mg by mouth daily. Patient takes a half of the 10 mg tablet to equal 5 mg every day    . lisinopril-hydrochlorothiazide (PRINZIDE,ZESTORETIC) 20-12.5 MG tablet Take 1 tablet by mouth daily.    Marland Kitchen acyclovir (ZOVIRAX) 400 MG tablet One pill a day [to prevent shingles] 60 tablet 3  . [START ON 07/28/2017] sulfamethoxazole-trimethoprim (BACTRIM DS,SEPTRA  DS) 800-160 MG tablet Take 1 tablet by mouth 3 (three) times a week. 30 tablet 0   No current facility-administered medications for this visit.     PHYSICAL EXAMINATION: ECOG PERFORMANCE STATUS: 0 - Asymptomatic  BP (!) 133/55 (BP Location: Left Arm, Patient Position: Sitting)   Pulse (!) 52   Temp (!) 97.2 F (36.2 C) (Oral)   Resp 20   Ht 5\' 8"  (1.727 m)   Wt 219 lb 5.7 oz (99.5 kg)   BMI 33.35 kg/m   Filed Weights   07/27/17 0844  Weight: 219 lb 5.7 oz (99.5 kg)    GENERAL: Well-nourished well-developed; Alert, no  distress and comfortable.   He is accompanied by his wife.  EYES: no pallor or icterus OROPHARYNX: no thrush or ulceration; good dentition  NECK: supple, no masses felt LYMPH:  no palpable lymphadenopathy in the cervical or inguinal regions. 1-2 cm lymph nodes noted in the right axilla. LUNGS: clear to auscultation and  No wheeze or crackles HEART/CVS: regular rate & rhythm and no murmurs; No lower extremity edema ABDOMEN:abdomen soft, non-tender and normal bowel sounds; positive for splenomegaly at level of umbilicus.  Musculoskeletal:no cyanosis of digits and no clubbing  PSYCH: alert & oriented x 3 with fluent speech NEURO: no focal motor/sensory deficits SKIN:  no rashes or significant lesions  LABORATORY DATA:  I have reviewed the data as listed    Component Value Date/Time   NA 134 (L) 07/27/2017 0824   NA 137 09/14/2014 0834   K 4.5 07/27/2017 0824   K 4.0 09/14/2014 0834   CL 100 (L) 07/27/2017 0824   CL 101 09/14/2014 0834   CO2 27 07/27/2017 0824   CO2 27 09/14/2014 0834   GLUCOSE 109 (H) 07/27/2017 0824   GLUCOSE 123 (H) 09/14/2014 0834   BUN 13 07/27/2017 0824   BUN 14 09/14/2014 0834   CREATININE 1.19 07/27/2017 0824   CREATININE 1.14 02/05/2015 0949   CALCIUM 9.1 07/27/2017 0824   CALCIUM 8.8 09/14/2014 0834   PROT 6.7 07/27/2017 0824   PROT 6.7 09/14/2014 0834   ALBUMIN 4.2 07/27/2017 0824   ALBUMIN 3.6 09/14/2014 0834   AST 23 07/27/2017 0824   AST 21 09/14/2014 0834   ALT 17 07/27/2017 0824   ALT 26 09/14/2014 0834   ALKPHOS 41 07/27/2017 0824   ALKPHOS 49 09/14/2014 0834   BILITOT 1.4 (H) 07/27/2017 0824   BILITOT 1.1 (H) 09/14/2014 0834   GFRNONAA 54 (L) 07/27/2017 0824   GFRNONAA 60 (L) 02/05/2015 0949   GFRAA >60 07/27/2017 0824   GFRAA >60 02/05/2015 0949    No results found for: SPEP, UPEP  Lab Results  Component Value Date   WBC 108.8 (HH) 07/27/2017   NEUTROABS 6.4 07/27/2017   HGB 15.5 07/27/2017   HCT 48.6 07/27/2017   MCV 94.6  07/27/2017   PLT 141 (L) 07/27/2017      Chemistry      Component Value Date/Time   NA 134 (L) 07/27/2017 0824   NA 137 09/14/2014 0834   K 4.5 07/27/2017 0824   K 4.0 09/14/2014 0834   CL 100 (L) 07/27/2017 0824   CL 101 09/14/2014 0834   CO2 27 07/27/2017 0824   CO2 27 09/14/2014 0834   BUN 13 07/27/2017 0824   BUN 14 09/14/2014 0834   CREATININE 1.19 07/27/2017 0824   CREATININE 1.14 02/05/2015 0949      Component Value Date/Time   CALCIUM 9.1 07/27/2017 0824   CALCIUM  8.8 09/14/2014 0834   ALKPHOS 41 07/27/2017 0824   ALKPHOS 49 09/14/2014 0834   AST 23 07/27/2017 0824   AST 21 09/14/2014 0834   ALT 17 07/27/2017 0824   ALT 26 09/14/2014 0834   BILITOT 1.4 (H) 07/27/2017 0824   BILITOT 1.1 (H) 09/14/2014 0834         ASSESSMENT & PLAN:   CLL (chronic lymphocytic leukemia) ([place]) # CLL Rai 13q del. Today her white count is 108 [predominant lymphocytosis]; hemoglobin 15 platelets 146 stable. However patient symptomatic from night sweats/weight loss/ and also early satiety abdominal pain from splenomegaly.  # Again reviewed the options of treatment including- ibrutinib vs. Gazyva+ chlorambucil.  Discussed the pros and cons of each treatment option; since Gazyva+ chlorambucil. I discussed the concerns of infusion reactions; risk of infections- including shingles hepatitis B etc. Check hepatitis panel.  # Discussed regarding- infectious prophylaxis; recommend acyclovir prophylaxis; Bactrim prophylaxis. Prescriptions given.  # Enlarged prostate- s/p ? cryo Dr.Wolfe.   # CKD creat 1.2-1.5- stage III/ sec to HTN ; stable.   # follow-up in approximately 1 week; Gazyva infusion/[place].      Cammie Sickle, MD 07/27/2017 1:49 PM

## 2017-07-28 ENCOUNTER — Inpatient Hospital Stay: Payer: Medicare Other

## 2017-07-28 ENCOUNTER — Encounter: Payer: Self-pay | Admitting: Pharmacist

## 2017-07-28 ENCOUNTER — Other Ambulatory Visit: Payer: Medicare Other

## 2017-07-28 ENCOUNTER — Telehealth: Payer: Self-pay | Admitting: Internal Medicine

## 2017-07-28 LAB — HEPATITIS B SURFACE ANTIGEN: Hepatitis B Surface Ag: NEGATIVE

## 2017-07-28 LAB — HEPATITIS B CORE ANTIBODY, IGM: Hep B C IgM: NEGATIVE

## 2017-07-28 LAB — HEPATITIS C ANTIBODY: HCV Ab: <0.1 s/co ratio (ref 0.0–0.9)

## 2017-07-28 NOTE — Telephone Encounter (Signed)
Please let pt know that he needs to update the DPR at next visit

## 2017-07-28 NOTE — Telephone Encounter (Addendum)
I spoke with patient in office today when here for chemotherapy education and he states we can talk to [place] and he was going to registration to add her to his release of info form. Please call her [phone]

## 2017-07-28 NOTE — Telephone Encounter (Signed)
Erroneous Encounter

## 2017-07-28 NOTE — Telephone Encounter (Signed)
Spoke to pt's daughter- Izora Gala re: the plan of care. The potential issues of the reactions; other potential side effects. She is in agreement.

## 2017-07-29 ENCOUNTER — Other Ambulatory Visit: Payer: Medicare Other

## 2017-07-29 ENCOUNTER — Ambulatory Visit
Admission: RE | Admit: 2017-07-29 | Discharge: 2017-07-29 | Disposition: A | Discharge status: Home / Self Care | Payer: Medicare Other | Source: Ambulatory Visit | Attending: Internal Medicine | Admitting: Internal Medicine

## 2017-07-29 DIAGNOSIS — C9190 Lymphoid leukemia, unspecified not having achieved remission: Secondary | ICD-10-CM | POA: Diagnosis not present

## 2017-07-29 DIAGNOSIS — M5136 Other intervertebral disc degeneration, lumbar region: Secondary | ICD-10-CM | POA: Diagnosis not present

## 2017-07-29 DIAGNOSIS — I2510 Atherosclerotic heart disease of native coronary artery without angina pectoris: Secondary | ICD-10-CM | POA: Diagnosis not present

## 2017-07-29 DIAGNOSIS — C9110 Chronic lymphocytic leukemia of B-cell type not having achieved remission: Secondary | ICD-10-CM

## 2017-07-29 DIAGNOSIS — I700 Atherosclerosis of aorta: Secondary | ICD-10-CM | POA: Insufficient documentation

## 2017-07-29 DIAGNOSIS — K769 Liver disease, unspecified: Secondary | ICD-10-CM | POA: Diagnosis not present

## 2017-07-29 DIAGNOSIS — M47896 Other spondylosis, lumbar region: Secondary | ICD-10-CM | POA: Diagnosis not present

## 2017-07-29 DIAGNOSIS — N281 Cyst of kidney, acquired: Secondary | ICD-10-CM | POA: Diagnosis not present

## 2017-07-29 DIAGNOSIS — R109 Unspecified abdominal pain: Secondary | ICD-10-CM | POA: Diagnosis not present

## 2017-07-29 DIAGNOSIS — R161 Splenomegaly, not elsewhere classified: Secondary | ICD-10-CM | POA: Diagnosis not present

## 2017-07-29 DIAGNOSIS — K5730 Diverticulosis of large intestine without perforation or abscess without bleeding: Secondary | ICD-10-CM | POA: Insufficient documentation

## 2017-07-29 MED ORDER — IOPAMIDOL (ISOVUE-300) INJECTION 61%
100.0000 mL | Freq: Once | INTRAVENOUS | Status: AC | PRN
  Administered 2017-07-29: 100 mL via INTRAVENOUS

## 2017-08-02 ENCOUNTER — Telehealth: Payer: Self-pay | Admitting: Pharmacist

## 2017-08-02 ENCOUNTER — Telehealth: Payer: Self-pay | Admitting: Internal Medicine

## 2017-08-02 ENCOUNTER — Encounter: Payer: Self-pay | Admitting: *Deleted

## 2017-08-02 MED ORDER — DEXAMETHASONE 4 MG PO TABS: ORAL_TABLET | ORAL | 3 refills | Status: DC

## 2017-08-02 MED ORDER — MONTELUKAST SODIUM 10 MG PO TABS: 10.0000 mg | ORAL_TABLET | Freq: Every day | ORAL | 0 refills | Status: DC

## 2017-08-02 NOTE — Telephone Encounter (Signed)
Oral Chemotherapy Pharmacist Encounter  Received prescription for chlorambucil and conducted benefits investigation. Patient does not have not have prescription insurance coverage and get all of his medication through the [place]. We can not send this prescription to the [place] to fill.   The cash price is $45/month. Spoke with Dr. Rogue Bussing and due to the medication cost, the plan it to proceed with IV therapy Gazyva alone at this time. Attempted to call patient to inform him his chlorambucil medication status. No answer LVM for patient to call back.   Darl Pikes, PharmD, BCPS Hematology/Oncology Clinical Pharmacist [facility] [phone]  08/02/2017 3:04 PM

## 2017-08-02 NOTE — Telephone Encounter (Signed)
Please inform patient to start steroids/dexamethasone; Singulair- as directed prescriptions into the pharmacy.  # Also recommend starting- 2 days prior to infusion for 4 days  1] Tylenol 650 mg once a day 2] Claritin once a day 3] Zantac 150 mg twice a day.   

## 2017-08-02 NOTE — Telephone Encounter (Signed)
Patient contacted by RN. Instructed patient with the premedication instructs per md order. Also added OTC meds to patient's med list.  Teach back process performed with patient. Pt also instructed to take the decadron tablets with food.

## 2017-08-04 ENCOUNTER — Inpatient Hospital Stay: Payer: Medicare Other | Attending: Internal Medicine | Admitting: Internal Medicine

## 2017-08-04 ENCOUNTER — Inpatient Hospital Stay: Payer: Medicare Other

## 2017-08-04 VITALS — BP 143/70 | HR 61 | Resp 18

## 2017-08-04 VITALS — BP 151/78 | HR 51 | Temp 98.4°F | Resp 16 | Wt 225.8 lb

## 2017-08-04 DIAGNOSIS — I129 Hypertensive chronic kidney disease with stage 1 through stage 4 chronic kidney disease, or unspecified chronic kidney disease: Secondary | ICD-10-CM | POA: Insufficient documentation

## 2017-08-04 DIAGNOSIS — Z888 Allergy status to other drugs, medicaments and biological substances status: Secondary | ICD-10-CM | POA: Insufficient documentation

## 2017-08-04 DIAGNOSIS — Z880 Allergy status to penicillin: Secondary | ICD-10-CM | POA: Diagnosis not present

## 2017-08-04 DIAGNOSIS — Z5111 Encounter for antineoplastic chemotherapy: Secondary | ICD-10-CM | POA: Insufficient documentation

## 2017-08-04 DIAGNOSIS — Z8042 Family history of malignant neoplasm of prostate: Secondary | ICD-10-CM | POA: Insufficient documentation

## 2017-08-04 DIAGNOSIS — Z79899 Other long term (current) drug therapy: Secondary | ICD-10-CM | POA: Insufficient documentation

## 2017-08-04 DIAGNOSIS — R161 Splenomegaly, not elsewhere classified: Secondary | ICD-10-CM | POA: Insufficient documentation

## 2017-08-04 DIAGNOSIS — I700 Atherosclerosis of aorta: Secondary | ICD-10-CM | POA: Insufficient documentation

## 2017-08-04 DIAGNOSIS — M109 Gout, unspecified: Secondary | ICD-10-CM | POA: Diagnosis not present

## 2017-08-04 DIAGNOSIS — G4730 Sleep apnea, unspecified: Secondary | ICD-10-CM | POA: Diagnosis not present

## 2017-08-04 DIAGNOSIS — K5730 Diverticulosis of large intestine without perforation or abscess without bleeding: Secondary | ICD-10-CM | POA: Insufficient documentation

## 2017-08-04 DIAGNOSIS — N400 Enlarged prostate without lower urinary tract symptoms: Secondary | ICD-10-CM | POA: Diagnosis not present

## 2017-08-04 DIAGNOSIS — Z808 Family history of malignant neoplasm of other organs or systems: Secondary | ICD-10-CM | POA: Insufficient documentation

## 2017-08-04 DIAGNOSIS — I2510 Atherosclerotic heart disease of native coronary artery without angina pectoris: Secondary | ICD-10-CM

## 2017-08-04 DIAGNOSIS — Z801 Family history of malignant neoplasm of trachea, bronchus and lung: Secondary | ICD-10-CM | POA: Insufficient documentation

## 2017-08-04 DIAGNOSIS — N183 Chronic kidney disease, stage 3 (moderate): Secondary | ICD-10-CM | POA: Diagnosis not present

## 2017-08-04 DIAGNOSIS — R61 Generalized hyperhidrosis: Secondary | ICD-10-CM | POA: Diagnosis not present

## 2017-08-04 DIAGNOSIS — I252 Old myocardial infarction: Secondary | ICD-10-CM | POA: Diagnosis not present

## 2017-08-04 DIAGNOSIS — M5136 Other intervertebral disc degeneration, lumbar region: Secondary | ICD-10-CM | POA: Insufficient documentation

## 2017-08-04 DIAGNOSIS — N281 Cyst of kidney, acquired: Secondary | ICD-10-CM | POA: Insufficient documentation

## 2017-08-04 DIAGNOSIS — C9110 Chronic lymphocytic leukemia of B-cell type not having achieved remission: Secondary | ICD-10-CM

## 2017-08-04 DIAGNOSIS — Z8052 Family history of malignant neoplasm of bladder: Secondary | ICD-10-CM | POA: Diagnosis not present

## 2017-08-04 DIAGNOSIS — E7800 Pure hypercholesterolemia, unspecified: Secondary | ICD-10-CM | POA: Insufficient documentation

## 2017-08-04 MED ORDER — SODIUM CHLORIDE 0.9 % IV SOLN
Freq: Once | INTRAVENOUS | Status: AC
  Administered 2017-08-04: 10:00:00 via INTRAVENOUS
  Filled 2017-08-04: qty 1000

## 2017-08-04 MED ORDER — DIPHENHYDRAMINE HCL 50 MG/ML IJ SOLN
50.0000 mg | Freq: Once | INTRAMUSCULAR | Status: AC
  Administered 2017-08-04: 50 mg via INTRAVENOUS
  Filled 2017-08-04: qty 1

## 2017-08-04 MED ORDER — SODIUM CHLORIDE 0.9 % IV SOLN
100.0000 mg | Freq: Once | INTRAVENOUS | Status: AC
  Administered 2017-08-04: 100 mg via INTRAVENOUS
  Filled 2017-08-04: qty 4

## 2017-08-04 MED ORDER — SODIUM CHLORIDE 0.9 % IV SOLN
20.0000 mg | Freq: Once | INTRAVENOUS | Status: AC
  Administered 2017-08-04: 20 mg via INTRAVENOUS
  Filled 2017-08-04: qty 2

## 2017-08-04 MED ORDER — METHYLPREDNISOLONE SODIUM SUCC 125 MG IJ SOLR
125.0000 mg | Freq: Once | INTRAMUSCULAR | Status: AC | PRN
  Administered 2017-08-04: 125 mg via INTRAVENOUS

## 2017-08-04 MED ORDER — SODIUM CHLORIDE 0.9 % IV SOLN
Freq: Once | INTRAVENOUS | Status: AC | PRN
  Administered 2017-08-04: 12:00:00 via INTRAVENOUS
  Filled 2017-08-04: qty 1000

## 2017-08-04 MED ORDER — ACETAMINOPHEN 325 MG PO TABS
650.0000 mg | ORAL_TABLET | Freq: Once | ORAL | Status: AC
Start: 2017-08-04 — End: 2017-08-04
  Administered 2017-08-04: 650 mg via ORAL
  Filled 2017-08-04: qty 2

## 2017-08-04 NOTE — Assessment & Plan Note (Addendum)
#   CLL Rai 13q del. White count is 108 [predominant lymphocytosis]; hemoglobin 15 platelets 146 stable. However patient symptomatic from night sweats/weight loss/ and also early satiety abdominal pain from splenomegaly. CT- scan- splenomegaly + multiple LN.   # Proceed with Gazyva.   I discussed the concerns of infusion reactions; risk of infections- including shingles; HepB/C work up-NEG.   # Discussed regarding- infectious prophylaxis; recommend acyclovir prophylaxis; Bactrim prophylaxis.   # CKD creat 1.2-1.5- stage III/ sec to HTN ; stable.   # 1 week/labs; Gazyva infusion; 2 weeks/MD; infusion; labs. Check FISH panel in 1 week. Discussed with pt's daughter, nancy. She will be with pt tomorrow.

## 2017-08-04 NOTE — Progress Notes (Signed)
[place] OFFICE PROGRESS NOTE   SUMMARY OF ONCOLOGIC HISTORY:  Oncology History   3 Lambda positive B-cell CLL, RAI stage 0, lymphocytosis only. 10/08/06 CT of chest, abdomen and pelvis - No evidence of splenomegaly. No evidence of lymphadenopathy in the chest, abdomen, or pelvis. 10/04/06 flow cytometry of peripheral blood - A monoclonal population of lambda positive B-cells was detected. Phenotype consistent with diagnosis of B cell chronic lymphocytic leukemia/small lymphocytic lymphoma. CD38 positive. 03/06/08 CT Chest abdomen Pelvis:No findings to suggest recurrent disease. No adenopathy. 06/04/09: CT chest/abdomen/pelvis: No evidence of lymphadenopathy/splenomegaly. 06/04/10: ct chest/abdomen/pelvis: no evidence of active malignancy within the thorax or  abdomen or pelvis.  # CLL- 13q del; July 2017 [facility] ordered.   # Sep 27th CT scan- LN+ splenomegaly ------------------------------------------------- --  # OCT 3rd 2018- Gazyva [No chlorambucil sec to insurance issues]  # Prostate Biopsy [PSA ~5; Dr.Wolfe]; June 2017; Enlarged prostate- s/p ? cryo Dr.Wolfe.   # CKD [creat 1.5]     CLL (chronic lymphocytic leukemia) ([place])    INTERVAL HISTORY:  A very pleasant 83 year old male patient with above history of CLL Is here to proceed with cycle #1 of Gazyva.  Patient interim has been started on premedications for his upcoming Gazyva treatment today.  Patient's abdominal pain has resolved. He continues to have intermittent episodes of night sweats. Appetite is fair.  Denies any shortness of breath or cough or fevers or infections.  Denies any new lumps or bumps. No sweats. No fevers. Denies any swelling in the legs.  REVIEW OF SYSTEMS:  A complete 10 point review of system is done which is negative except mentioned above/history of present illness.   PAST MEDICAL HISTORY :  Past Medical History:  Diagnosis Date  . Bladder neck obstruction   . Cataracts, bilateral    . Chronic lymphocytic leukemia (CLL), B-cell (HCC)    Lambda positive B-cell CLL  . Gout   . Hypercholesterolemia   . Hypertension   . Lymphocytosis   . Sleep apnea   . Thrombocytopenia ([place])     PAST SURGICAL HISTORY :   Past Surgical History:  Procedure Laterality Date  . COLONOSCOPY WITH PROPOFOL N/A 09/10/2015   Procedure: COLONOSCOPY WITH PROPOFOL;  Surgeon: Lucilla Lame, MD;  Location: [facility] ENDOSCOPY;  Service: Endoscopy;  Laterality: N/A;  . PROSTATE SURGERY  2008    FAMILY HISTORY :   Family History  Problem Relation Age of Onset  . Lung cancer Mother 85       deceased  . Brain cancer Son 14       pt's only son  . Polycythemia Brother        half brother  . Prostate cancer Brother     SOCIAL HISTORY:   Social History  Substance Use Topics  . Smoking status: Never Smoker  . Smokeless tobacco: Not on file  . Alcohol use No    ALLERGIES:  is allergic to penicillins.  MEDICATIONS:  Current Outpatient Prescriptions  Medication Sig Dispense Refill  . acetaminophen (TYLENOL) 325 MG tablet Take 650 mg by mouth daily. Take 650 mg daily - 2 days prior to [facility] IV infusion    . acyclovir (ZOVIRAX) 400 MG tablet One pill a day [to prevent shingles] 60 tablet 3  . allopurinol (ZYLOPRIM) 100 MG tablet Take 200 mg by mouth daily.     Marland Kitchen amLODipine (NORVASC) 10 MG tablet Take 5 mg by mouth daily. Patient takes a half of the 10 mg tablet to  equal 5 mg every day    . chlorambucil (LEUKERAN) 2 MG tablet Take 25 tablets (50 mg total) by mouth every 14 (fourteen) days. Take on days 1 and 15 of chemotherapy. Repeat every 28 days. Give on an empty stomach 1 hour before or 2 hours after meals. 50 tablet 5  . dexamethasone (DECADRON) 4 MG tablet Start 2 days prior to infusion; Take for 2 days. Do not take on the day of infusion. 60 tablet 3  . lisinopril-hydrochlorothiazide (PRINZIDE,ZESTORETIC) 20-12.5 MG tablet Take 1 tablet by mouth daily.    Marland Kitchen loratadine (CLARITIN) 10 MG tablet  Take 10 mg by mouth daily. Take 1 tablet daily starting 2 days prior to [facility] IV infusion    . montelukast (SINGULAIR) 10 MG tablet Take 1 tablet (10 mg total) by mouth at bedtime. Start 2 days prior to infusion. Take it for 4 days. 60 tablet 0  . ondansetron (ZOFRAN) 8 MG tablet 1 pill every 8 hours as needed for nausea/vomitting 40 tablet 1  . prochlorperazine (COMPAZINE) 10 MG tablet Take 1 tablet (10 mg total) by mouth every 6 (six) hours as needed for nausea or vomiting. 40 tablet 1  . ranitidine (ZANTAC) 150 MG capsule Take 150 mg by mouth 2 (two) times daily. Start taking 2 days prior to [facility] IV infusion    . sulfamethoxazole-trimethoprim (BACTRIM DS,SEPTRA DS) 800-160 MG tablet Take 1 tablet by mouth 3 (three) times a week. 30 tablet 0   No current facility-administered medications for this visit.    Facility-Administered Medications Ordered in Other Visits  Medication Dose Route Frequency Provider Last Rate Last Dose  . obinutuzumab (GAZYVA) 100 mg in sodium chloride 0.9 % 100 mL (0.9615 mg/mL) chemo infusion  100 mg Intravenous Once Cammie Sickle, MD        PHYSICAL EXAMINATION: ECOG PERFORMANCE STATUS: 0 - Asymptomatic  BP (!) 151/78 (BP Location: Left Arm, Patient Position: Sitting)   Pulse (!) 51   Temp 98.4 F (36.9 C) (Tympanic)   Resp 16   Wt 225 lb 12.8 oz (102.4 kg)   BMI 34.33 kg/m   Filed Weights   08/04/17 0843  Weight: 225 lb 12.8 oz (102.4 kg)    GENERAL: Well-nourished well-developed; Alert, no distress and comfortable.   He is alone.   EYES: no pallor or icterus OROPHARYNX: no thrush or ulceration; good dentition  NECK: supple, no masses felt LYMPH:  no palpable lymphadenopathy in the cervical or inguinal regions. 1-2 cm lymph nodes noted in the right axilla. LUNGS: clear to auscultation and  No wheeze or crackles HEART/CVS: regular rate & rhythm and no murmurs; No lower extremity edema ABDOMEN:abdomen soft, non-tender and normal bowel sounds;  positive for splenomegaly at level of umbilicus.  Musculoskeletal:no cyanosis of digits and no clubbing  PSYCH: alert & oriented x 3 with fluent speech NEURO: no focal motor/sensory deficits SKIN:  no rashes or significant lesions  LABORATORY DATA:  I have reviewed the data as listed    Component Value Date/Time   NA 134 (L) 07/27/2017 0824   NA 137 09/14/2014 0834   K 4.5 07/27/2017 0824   K 4.0 09/14/2014 0834   CL 100 (L) 07/27/2017 0824   CL 101 09/14/2014 0834   CO2 27 07/27/2017 0824   CO2 27 09/14/2014 0834   GLUCOSE 109 (H) 07/27/2017 0824   GLUCOSE 123 (H) 09/14/2014 0834   BUN 13 07/27/2017 0824   BUN 14 09/14/2014 0834   CREATININE 1.19 07/27/2017  0824   CREATININE 1.14 02/05/2015 0949   CALCIUM 9.1 07/27/2017 0824   CALCIUM 8.8 09/14/2014 0834   PROT 6.7 07/27/2017 0824   PROT 6.7 09/14/2014 0834   ALBUMIN 4.2 07/27/2017 0824   ALBUMIN 3.6 09/14/2014 0834   AST 23 07/27/2017 0824   AST 21 09/14/2014 0834   ALT 17 07/27/2017 0824   ALT 26 09/14/2014 0834   ALKPHOS 41 07/27/2017 0824   ALKPHOS 49 09/14/2014 0834   BILITOT 1.4 (H) 07/27/2017 0824   BILITOT 1.1 (H) 09/14/2014 0834   GFRNONAA 54 (L) 07/27/2017 0824   GFRNONAA 60 (L) 02/05/2015 0949   GFRAA >60 07/27/2017 0824   GFRAA >60 02/05/2015 0949    No results found for: SPEP, UPEP  Lab Results  Component Value Date   WBC 108.8 (HH) 07/27/2017   NEUTROABS 6.4 07/27/2017   HGB 15.5 07/27/2017   HCT 48.6 07/27/2017   MCV 94.6 07/27/2017   PLT 141 (L) 07/27/2017      Chemistry      Component Value Date/Time   NA 134 (L) 07/27/2017 0824   NA 137 09/14/2014 0834   K 4.5 07/27/2017 0824   K 4.0 09/14/2014 0834   CL 100 (L) 07/27/2017 0824   CL 101 09/14/2014 0834   CO2 27 07/27/2017 0824   CO2 27 09/14/2014 0834   BUN 13 07/27/2017 0824   BUN 14 09/14/2014 0834   CREATININE 1.19 07/27/2017 0824   CREATININE 1.14 02/05/2015 0949      Component Value Date/Time   CALCIUM 9.1 07/27/2017  0824   CALCIUM 8.8 09/14/2014 0834   ALKPHOS 41 07/27/2017 0824   ALKPHOS 49 09/14/2014 0834   AST 23 07/27/2017 0824   AST 21 09/14/2014 0834   ALT 17 07/27/2017 0824   ALT 26 09/14/2014 0834   BILITOT 1.4 (H) 07/27/2017 0824   BILITOT 1.1 (H) 09/14/2014 0834      IMPRESSION: 1. Splenomegaly, splenic volume estimated at 1000 cc. 2. Abnormal number of scattered lymph nodes in the abdomen and pelvis ; some of these are enlarged as detailed above. These may be a manifestation of the patient's CLL. 3. Stranding along some omental adipose tissue in the left lower quadrant may be from a remote omental infarct or less likely appendagitis epiploica. Correlate with any point tenderness in this vicinity. 4. Other imaging findings of potential clinical significance: Coronary atherosclerosis. Aortic valve calcification. Thinning of the left ventricle, query prior myocardial infarct. Several renal cysts including a Bosniak category 2 cyst of the right kidney upper pole. Hypodense lesions in segment 4 of the liver and in both kidneys are technically too small to characterize although statistically likely to be benign. Sigmoid diverticulosis. Aortic Atherosclerosis (ICD10-I70.0). Prostatomegaly. Lumbar spondylosis and degenerative disc disease causing lower lumbar impingement.  Electronically Signed: By: Van Clines M.D. On: 07/29/2017 14:13   ASSESSMENT & PLAN:   CLL (chronic lymphocytic leukemia) ([place]) # CLL Rai 13q del. White count is 108 [predominant lymphocytosis]; hemoglobin 15 platelets 146 stable. However patient symptomatic from night sweats/weight loss/ and also early satiety abdominal pain from splenomegaly. CT- scan- splenomegaly + multiple LN.   # Proceed with Gazyva.   I discussed the concerns of infusion reactions; risk of infections- including shingles; HepB/C work up-NEG.   # Discussed regarding- infectious prophylaxis; recommend acyclovir prophylaxis; Bactrim  prophylaxis.   # CKD creat 1.2-1.5- stage III/ sec to HTN ; stable.   # 1 week/labs; Gazyva infusion; 2 weeks/MD; infusion; labs. Check  FISH panel in 1 week. Discussed with pt's daughter, nancy. She will be with pt tomorrow.       Cammie Sickle, MD 08/04/2017 11:16 AM

## 2017-08-04 NOTE — Progress Notes (Signed)
1152-Patient states, "I am having some tingling in my hands. It almost feels like they are going to sleep. Kind of a numb feeling." Gazyva stopped. Vital signs stable. MD, Dr. Rogue Bussing, notified via telephone and present at chairside at 1155. Per MD order: solu-medrol given and IV fluids initiated. See MAR. Per Dr. Rogue Bussing order: wait 30 minutes post Solu-medrol and then restart Gazyva at that time.  1233- Patient states, "My hands feel better and are not numb anymore." Gazyva restarted at 48ml/hr. Will continue to monitor patient for any further signs/symptoms.

## 2017-08-05 ENCOUNTER — Inpatient Hospital Stay: Payer: Medicare Other

## 2017-08-05 VITALS — BP 112/66 | HR 53 | Temp 97.3°F | Resp 18

## 2017-08-05 DIAGNOSIS — R161 Splenomegaly, not elsewhere classified: Secondary | ICD-10-CM | POA: Diagnosis not present

## 2017-08-05 DIAGNOSIS — R61 Generalized hyperhidrosis: Secondary | ICD-10-CM | POA: Diagnosis not present

## 2017-08-05 DIAGNOSIS — C9110 Chronic lymphocytic leukemia of B-cell type not having achieved remission: Secondary | ICD-10-CM | POA: Diagnosis not present

## 2017-08-05 DIAGNOSIS — Z5111 Encounter for antineoplastic chemotherapy: Secondary | ICD-10-CM | POA: Diagnosis not present

## 2017-08-05 DIAGNOSIS — N183 Chronic kidney disease, stage 3 (moderate): Secondary | ICD-10-CM | POA: Diagnosis not present

## 2017-08-05 DIAGNOSIS — I129 Hypertensive chronic kidney disease with stage 1 through stage 4 chronic kidney disease, or unspecified chronic kidney disease: Secondary | ICD-10-CM | POA: Diagnosis not present

## 2017-08-05 MED ORDER — ACETAMINOPHEN 325 MG PO TABS
650.0000 mg | ORAL_TABLET | Freq: Once | ORAL | Status: AC
  Administered 2017-08-05: 650 mg via ORAL
  Filled 2017-08-05: qty 2

## 2017-08-05 MED ORDER — SODIUM CHLORIDE 0.9 % IV SOLN
20.0000 mg | Freq: Once | INTRAVENOUS | Status: AC
  Administered 2017-08-05: 20 mg via INTRAVENOUS
  Filled 2017-08-05: qty 2

## 2017-08-05 MED ORDER — SODIUM CHLORIDE 0.9 % IV SOLN
900.0000 mg | Freq: Once | INTRAVENOUS | Status: AC
  Administered 2017-08-05: 900 mg via INTRAVENOUS
  Filled 2017-08-05: qty 36

## 2017-08-05 MED ORDER — FAMOTIDINE IN NACL 20-0.9 MG/50ML-% IV SOLN
20.0000 mg | Freq: Once | INTRAVENOUS | Status: AC
  Administered 2017-08-05: 20 mg via INTRAVENOUS
  Filled 2017-08-05: qty 50

## 2017-08-05 MED ORDER — DIPHENHYDRAMINE HCL 50 MG/ML IJ SOLN
50.0000 mg | Freq: Once | INTRAMUSCULAR | Status: AC
  Administered 2017-08-05: 50 mg via INTRAVENOUS
  Filled 2017-08-05: qty 1

## 2017-08-05 MED ORDER — SODIUM CHLORIDE 0.9 % IV SOLN
Freq: Once | INTRAVENOUS | Status: AC
  Administered 2017-08-05: 09:00:00 via INTRAVENOUS
  Filled 2017-08-05: qty 1000

## 2017-08-11 ENCOUNTER — Inpatient Hospital Stay: Payer: Medicare Other

## 2017-08-11 DIAGNOSIS — I129 Hypertensive chronic kidney disease with stage 1 through stage 4 chronic kidney disease, or unspecified chronic kidney disease: Secondary | ICD-10-CM | POA: Diagnosis not present

## 2017-08-11 DIAGNOSIS — R161 Splenomegaly, not elsewhere classified: Secondary | ICD-10-CM | POA: Diagnosis not present

## 2017-08-11 DIAGNOSIS — C9110 Chronic lymphocytic leukemia of B-cell type not having achieved remission: Secondary | ICD-10-CM

## 2017-08-11 DIAGNOSIS — N183 Chronic kidney disease, stage 3 (moderate): Secondary | ICD-10-CM | POA: Diagnosis not present

## 2017-08-11 DIAGNOSIS — R61 Generalized hyperhidrosis: Secondary | ICD-10-CM | POA: Diagnosis not present

## 2017-08-11 DIAGNOSIS — Z5111 Encounter for antineoplastic chemotherapy: Secondary | ICD-10-CM | POA: Diagnosis not present

## 2017-08-11 LAB — CBC WITH DIFFERENTIAL/PLATELET
Basophils Absolute: 0.1 10*3/uL (ref 0–0.1)
Basophils Relative: 0 %
Eosinophils Absolute: 0 10*3/uL (ref 0–0.7)
Eosinophils Relative: 0 %
HEMATOCRIT: 46.2 % (ref 40.0–52.0)
HEMOGLOBIN: 15.2 g/dL (ref 13.0–18.0)
LYMPHS ABS: 28.4 10*3/uL — AB (ref 1.0–3.6)
Lymphocytes Relative: 76 %
MCH: 30.6 pg (ref 26.0–34.0)
MCHC: 33 g/dL (ref 32.0–36.0)
MCV: 92.7 fL (ref 80.0–100.0)
MONOS PCT: 1 %
Monocytes Absolute: 0.5 10*3/uL (ref 0.2–1.0)
NEUTROS ABS: 8.7 10*3/uL — AB (ref 1.4–6.5)
NEUTROS PCT: 23 %
Platelets: 155 10*3/uL (ref 150–440)
RBC: 4.98 MIL/uL (ref 4.40–5.90)
RDW: 16.1 % — ABNORMAL HIGH (ref 11.5–14.5)
WBC: 37.8 10*3/uL — ABNORMAL HIGH (ref 3.8–10.6)

## 2017-08-11 LAB — BASIC METABOLIC PANEL
Anion gap: 10 (ref 5–15)
BUN: 22 mg/dL — AB (ref 6–20)
CHLORIDE: 101 mmol/L (ref 101–111)
CO2: 22 mmol/L (ref 22–32)
CREATININE: 1.09 mg/dL (ref 0.61–1.24)
Calcium: 8.8 mg/dL — ABNORMAL LOW (ref 8.9–10.3)
GFR calc Af Amer: >60 mL/min (ref >60)
GFR calc non Af Amer: >60 mL/min (ref >60)
Glucose, Bld: 143 mg/dL — ABNORMAL HIGH (ref 65–99)
Potassium: 4 mmol/L (ref 3.5–5.1)
Sodium: 133 mmol/L — ABNORMAL LOW (ref 135–145)

## 2017-08-11 MED ORDER — SODIUM CHLORIDE 0.9 % IV SOLN
1000.0000 mg | Freq: Once | INTRAVENOUS | Status: AC
  Administered 2017-08-11: 1000 mg via INTRAVENOUS
  Filled 2017-08-11: qty 40

## 2017-08-11 MED ORDER — SODIUM CHLORIDE 0.9 % IV SOLN
Freq: Once | INTRAVENOUS | Status: AC
  Administered 2017-08-11: 09:00:00 via INTRAVENOUS
  Filled 2017-08-11: qty 1000

## 2017-08-11 MED ORDER — ACETAMINOPHEN 325 MG PO TABS
650.0000 mg | ORAL_TABLET | Freq: Once | ORAL | Status: AC
  Administered 2017-08-11: 650 mg via ORAL
  Filled 2017-08-11: qty 2

## 2017-08-11 MED ORDER — DIPHENHYDRAMINE HCL 50 MG/ML IJ SOLN
50.0000 mg | Freq: Once | INTRAMUSCULAR | Status: AC
  Administered 2017-08-11: 50 mg via INTRAVENOUS
  Filled 2017-08-11: qty 1

## 2017-08-11 MED ORDER — SODIUM CHLORIDE 0.9 % IV SOLN
20.0000 mg | Freq: Once | INTRAVENOUS | Status: AC
  Administered 2017-08-11: 20 mg via INTRAVENOUS
  Filled 2017-08-11: qty 2

## 2017-08-18 ENCOUNTER — Other Ambulatory Visit: Payer: Self-pay

## 2017-08-18 ENCOUNTER — Inpatient Hospital Stay: Payer: Medicare Other

## 2017-08-18 ENCOUNTER — Inpatient Hospital Stay (HOSPITAL_BASED_OUTPATIENT_CLINIC_OR_DEPARTMENT_OTHER): Payer: Medicare Other | Admitting: Internal Medicine

## 2017-08-18 VITALS — BP 135/79 | HR 59 | Temp 96.4°F | Resp 18

## 2017-08-18 VITALS — BP 152/71 | HR 54 | Temp 95.7°F | Resp 18 | Wt 223.8 lb

## 2017-08-18 DIAGNOSIS — C9110 Chronic lymphocytic leukemia of B-cell type not having achieved remission: Secondary | ICD-10-CM

## 2017-08-18 DIAGNOSIS — G4730 Sleep apnea, unspecified: Secondary | ICD-10-CM | POA: Diagnosis not present

## 2017-08-18 DIAGNOSIS — R161 Splenomegaly, not elsewhere classified: Secondary | ICD-10-CM | POA: Diagnosis not present

## 2017-08-18 DIAGNOSIS — N281 Cyst of kidney, acquired: Secondary | ICD-10-CM

## 2017-08-18 DIAGNOSIS — Z79899 Other long term (current) drug therapy: Secondary | ICD-10-CM

## 2017-08-18 DIAGNOSIS — N183 Chronic kidney disease, stage 3 (moderate): Secondary | ICD-10-CM

## 2017-08-18 DIAGNOSIS — R61 Generalized hyperhidrosis: Secondary | ICD-10-CM

## 2017-08-18 DIAGNOSIS — Z888 Allergy status to other drugs, medicaments and biological substances status: Secondary | ICD-10-CM

## 2017-08-18 DIAGNOSIS — Z5111 Encounter for antineoplastic chemotherapy: Secondary | ICD-10-CM | POA: Diagnosis not present

## 2017-08-18 DIAGNOSIS — I252 Old myocardial infarction: Secondary | ICD-10-CM

## 2017-08-18 DIAGNOSIS — M109 Gout, unspecified: Secondary | ICD-10-CM

## 2017-08-18 DIAGNOSIS — E7800 Pure hypercholesterolemia, unspecified: Secondary | ICD-10-CM | POA: Diagnosis not present

## 2017-08-18 DIAGNOSIS — K5730 Diverticulosis of large intestine without perforation or abscess without bleeding: Secondary | ICD-10-CM

## 2017-08-18 DIAGNOSIS — M5136 Other intervertebral disc degeneration, lumbar region: Secondary | ICD-10-CM | POA: Diagnosis not present

## 2017-08-18 DIAGNOSIS — Z880 Allergy status to penicillin: Secondary | ICD-10-CM

## 2017-08-18 DIAGNOSIS — I129 Hypertensive chronic kidney disease with stage 1 through stage 4 chronic kidney disease, or unspecified chronic kidney disease: Secondary | ICD-10-CM

## 2017-08-18 DIAGNOSIS — I2510 Atherosclerotic heart disease of native coronary artery without angina pectoris: Secondary | ICD-10-CM

## 2017-08-18 LAB — CBC WITH DIFFERENTIAL/PLATELET
BASOS PCT: 0 %
Basophils Absolute: 0.1 10*3/uL (ref 0–0.1)
EOS ABS: 0.1 10*3/uL (ref 0–0.7)
EOS PCT: 0 %
HCT: 43.7 % (ref 40.0–52.0)
Hemoglobin: 14.5 g/dL (ref 13.0–18.0)
LYMPHS ABS: 12.3 10*3/uL — AB (ref 1.0–3.6)
Lymphocytes Relative: 60 %
MCH: 30.8 pg (ref 26.0–34.0)
MCHC: 33.3 g/dL (ref 32.0–36.0)
MCV: 92.7 fL (ref 80.0–100.0)
Monocytes Absolute: 0.7 10*3/uL (ref 0.2–1.0)
Monocytes Relative: 4 %
Neutro Abs: 7.4 10*3/uL — ABNORMAL HIGH (ref 1.4–6.5)
Neutrophils Relative %: 36 %
PLATELETS: 139 10*3/uL — AB (ref 150–440)
RBC: 4.72 MIL/uL (ref 4.40–5.90)
RDW: 15.8 % — ABNORMAL HIGH (ref 11.5–14.5)
WBC: 20.6 10*3/uL — ABNORMAL HIGH (ref 3.8–10.6)

## 2017-08-18 LAB — COMPREHENSIVE METABOLIC PANEL
ALBUMIN: 3.6 g/dL (ref 3.5–5.0)
ALT: 17 U/L (ref 17–63)
ANION GAP: 4 — AB (ref 5–15)
AST: 19 U/L (ref 15–41)
Alkaline Phosphatase: 35 U/L — ABNORMAL LOW (ref 38–126)
BUN: 22 mg/dL — ABNORMAL HIGH (ref 6–20)
CHLORIDE: 103 mmol/L (ref 101–111)
CO2: 25 mmol/L (ref 22–32)
Calcium: 8.6 mg/dL — ABNORMAL LOW (ref 8.9–10.3)
Creatinine, Ser: 1 mg/dL (ref 0.61–1.24)
GFR calc non Af Amer: >60 mL/min (ref >60)
GLUCOSE: 92 mg/dL (ref 65–99)
Potassium: 4.1 mmol/L (ref 3.5–5.1)
SODIUM: 132 mmol/L — AB (ref 135–145)
Total Bilirubin: 1.8 mg/dL — ABNORMAL HIGH (ref 0.3–1.2)
Total Protein: 5.8 g/dL — ABNORMAL LOW (ref 6.5–8.1)

## 2017-08-18 LAB — FISH HES LEUKEMIA, 4Q12 REA: PDF LCA: 0

## 2017-08-18 MED ORDER — OBINUTUZUMAB CHEMO INJECTION 1000 MG/40ML
1000.0000 mg | Freq: Once | INTRAVENOUS | Status: AC
  Administered 2017-08-18: 1000 mg via INTRAVENOUS
  Filled 2017-08-18: qty 40

## 2017-08-18 MED ORDER — ACETAMINOPHEN 325 MG PO TABS
650.0000 mg | ORAL_TABLET | Freq: Once | ORAL | Status: AC
Start: 2017-08-18 — End: 2017-08-18
  Administered 2017-08-18: 650 mg via ORAL
  Filled 2017-08-18: qty 2

## 2017-08-18 MED ORDER — SODIUM CHLORIDE 0.9 % IV SOLN
20.0000 mg | Freq: Once | INTRAVENOUS | Status: AC
  Administered 2017-08-18: 20 mg via INTRAVENOUS
  Filled 2017-08-18: qty 2

## 2017-08-18 MED ORDER — DIPHENHYDRAMINE HCL 50 MG/ML IJ SOLN
50.0000 mg | Freq: Once | INTRAMUSCULAR | Status: AC
Start: 2017-08-18 — End: 2017-08-18
  Administered 2017-08-18: 50 mg via INTRAVENOUS
  Filled 2017-08-18: qty 1

## 2017-08-18 MED ORDER — SODIUM CHLORIDE 0.9 % IV SOLN
Freq: Once | INTRAVENOUS | Status: AC
  Administered 2017-08-18: 09:00:00 via INTRAVENOUS
  Filled 2017-08-18: qty 1000

## 2017-08-18 NOTE — Progress Notes (Signed)
[place] OFFICE PROGRESS NOTE   SUMMARY OF ONCOLOGIC HISTORY:  Oncology History   3 Lambda positive B-cell CLL, RAI stage 0, lymphocytosis only. 10/08/06 CT of chest, abdomen and pelvis - No evidence of splenomegaly. No evidence of lymphadenopathy in the chest, abdomen, or pelvis. 10/04/06 flow cytometry of peripheral blood - A monoclonal population of lambda positive B-cells was detected. Phenotype consistent with diagnosis of B cell chronic lymphocytic leukemia/small lymphocytic lymphoma. CD38 positive. 03/06/08 CT Chest abdomen Pelvis:No findings to suggest recurrent disease. No adenopathy. 06/04/09: CT chest/abdomen/pelvis: No evidence of lymphadenopathy/splenomegaly. 06/04/10: ct chest/abdomen/pelvis: no evidence of active malignancy within the thorax or  abdomen or pelvis.  # CLL- 13q del; July 2017 [facility] ordered.   # Sep 27th CT scan- LN+ splenomegaly ------------------------------------------------- --  # OCT 3rd 2018- Gazyva [No chlorambucil sec to insurance issues]  # Prostate Biopsy [PSA ~5; Dr.Wolfe]; June 2017; Enlarged prostate- s/p ? cryo Dr.Wolfe.   # CKD [creat 1.5]     CLL (chronic lymphocytic leukemia) ([place])    INTERVAL HISTORY:  A very pleasant 83 year old male patient with above history of CLL Is here currently s/p cycle #1 of Gazyva day 8 appx 1 week ago.   Patient does expected had infusion reaction to [facility]; with the first cycle. This subsequently improved with steroid pre- medication.  Patient states that she is feeling much better since starting the treatment. Patient's abdominal pain has resolved.appetite is significantly improved. Denies any shortness of breath or cough or fevers or infections.  Denies any new lumps or bumps.   REVIEW OF SYSTEMS:  A complete 10 point review of system is done which is negative except mentioned above/history of present illness.   PAST MEDICAL HISTORY :  Past Medical History:  Diagnosis Date  . Bladder neck  obstruction   . Cataracts, bilateral   . Chronic lymphocytic leukemia (CLL), B-cell (HCC)    Lambda positive B-cell CLL  . Gout   . Hypercholesterolemia   . Hypertension   . Lymphocytosis   . Sleep apnea   . Thrombocytopenia ([place])     PAST SURGICAL HISTORY :   Past Surgical History:  Procedure Laterality Date  . COLONOSCOPY WITH PROPOFOL N/A 09/10/2015   Procedure: COLONOSCOPY WITH PROPOFOL;  Surgeon: Lucilla Lame, MD;  Location: [facility] ENDOSCOPY;  Service: Endoscopy;  Laterality: N/A;  . PROSTATE SURGERY  2008    FAMILY HISTORY :   Family History  Problem Relation Age of Onset  . Lung cancer Mother 56       deceased  . Brain cancer Son 79       pt's only son  . Polycythemia Brother        half brother  . Prostate cancer Brother     SOCIAL HISTORY:   Social History  Substance Use Topics  . Smoking status: Never Smoker  . Smokeless tobacco: Not on file  . Alcohol use No    ALLERGIES:  is allergic to penicillins.  MEDICATIONS:  Current Outpatient Prescriptions  Medication Sig Dispense Refill  . acetaminophen (TYLENOL) 325 MG tablet Take 650 mg by mouth daily. Take 650 mg daily - 2 days prior to [facility] IV infusion    . acyclovir (ZOVIRAX) 400 MG tablet One pill a day [to prevent shingles] 60 tablet 3  . allopurinol (ZYLOPRIM) 100 MG tablet Take 200 mg by mouth daily.     Marland Kitchen amLODipine (NORVASC) 10 MG tablet Take 5 mg by mouth daily. Patient takes a half of  the 10 mg tablet to equal 5 mg every day    . dexamethasone (DECADRON) 4 MG tablet Start 2 days prior to infusion; Take for 2 days. Do not take on the day of infusion. 60 tablet 3  . lisinopril-hydrochlorothiazide (PRINZIDE,ZESTORETIC) 20-12.5 MG tablet Take 1 tablet by mouth daily.    Marland Kitchen loratadine (CLARITIN) 10 MG tablet Take 10 mg by mouth daily. Take 1 tablet daily starting 2 days prior to [facility] IV infusion    . montelukast (SINGULAIR) 10 MG tablet Take 1 tablet (10 mg total) by mouth at bedtime. Start 2 days prior  to infusion. Take it for 4 days. 60 tablet 0  . ranitidine (ZANTAC) 150 MG capsule Take 150 mg by mouth 2 (two) times daily. Start taking 2 days prior to [facility] IV infusion    . sulfamethoxazole-trimethoprim (BACTRIM DS,SEPTRA DS) 800-160 MG tablet Take 1 tablet by mouth 3 (three) times a week. 30 tablet 0  . chlorambucil (LEUKERAN) 2 MG tablet Take 25 tablets (50 mg total) by mouth every 14 (fourteen) days. Take on days 1 and 15 of chemotherapy. Repeat every 28 days. Give on an empty stomach 1 hour before or 2 hours after meals. (Patient not taking: Reported on 08/18/2017) 50 tablet 5  . ondansetron (ZOFRAN) 8 MG tablet 1 pill every 8 hours as needed for nausea/vomitting (Patient not taking: Reported on 08/18/2017) 40 tablet 1  . prochlorperazine (COMPAZINE) 10 MG tablet Take 1 tablet (10 mg total) by mouth every 6 (six) hours as needed for nausea or vomiting. (Patient not taking: Reported on 08/18/2017) 40 tablet 1   No current facility-administered medications for this visit.     PHYSICAL EXAMINATION: ECOG PERFORMANCE STATUS: 0 - Asymptomatic  BP (!) 152/71 (BP Location: Left Arm, Patient Position: Sitting)   Pulse (!) 54   Temp (!) 95.7 F (35.4 C) (Tympanic)   Resp 18   Wt 223 lb 12.8 oz (101.5 kg)   BMI 34.03 kg/m   Filed Weights   08/18/17 0834  Weight: 223 lb 12.8 oz (101.5 kg)    GENERAL: Well-nourished well-developed; Alert, no distress and comfortable.   He is with his wife.  EYES: no pallor or icterus OROPHARYNX: no thrush or ulceration; good dentition  NECK: supple, no masses felt LYMPH:  no palpable lymphadenopathy in the cervical or inguinal regions. 1-2 cm lymph nodes noted in the right axilla. LUNGS: clear to auscultation and  No wheeze or crackles HEART/CVS: regular rate & rhythm and no murmurs; No lower extremity edema ABDOMEN:abdomen soft, non-tender and normal bowel sounds; positive for splenomegaly at level of umbilicus.  Musculoskeletal:no cyanosis of digits  and no clubbing  PSYCH: alert & oriented x 3 with fluent speech NEURO: no focal motor/sensory deficits SKIN:  no rashes or significant lesions  LABORATORY DATA:  I have reviewed the data as listed    Component Value Date/Time   NA 132 (L) 08/18/2017 0813   NA 137 09/14/2014 0834   K 4.1 08/18/2017 0813   K 4.0 09/14/2014 0834   CL 103 08/18/2017 0813   CL 101 09/14/2014 0834   CO2 25 08/18/2017 0813   CO2 27 09/14/2014 0834   GLUCOSE 92 08/18/2017 0813   GLUCOSE 123 (H) 09/14/2014 0834   BUN 22 (H) 08/18/2017 0813   BUN 14 09/14/2014 0834   CREATININE 1.00 08/18/2017 0813   CREATININE 1.14 02/05/2015 0949   CALCIUM 8.6 (L) 08/18/2017 0813   CALCIUM 8.8 09/14/2014 0834   PROT 5.8 (  L) 08/18/2017 0813   PROT 6.7 09/14/2014 0834   ALBUMIN 3.6 08/18/2017 0813   ALBUMIN 3.6 09/14/2014 0834   AST 19 08/18/2017 0813   AST 21 09/14/2014 0834   ALT 17 08/18/2017 0813   ALT 26 09/14/2014 0834   ALKPHOS 35 (L) 08/18/2017 0813   ALKPHOS 49 09/14/2014 0834   BILITOT 1.8 (H) 08/18/2017 0813   BILITOT 1.1 (H) 09/14/2014 0834   GFRNONAA >60 08/18/2017 0813   GFRNONAA 60 (L) 02/05/2015 0949   GFRAA >60 08/18/2017 0813   GFRAA >60 02/05/2015 0949    No results found for: SPEP, UPEP  Lab Results  Component Value Date   WBC 20.6 (H) 08/18/2017   NEUTROABS 7.4 (H) 08/18/2017   HGB 14.5 08/18/2017   HCT 43.7 08/18/2017   MCV 92.7 08/18/2017   PLT 139 (L) 08/18/2017      Chemistry      Component Value Date/Time   NA 132 (L) 08/18/2017 0813   NA 137 09/14/2014 0834   K 4.1 08/18/2017 0813   K 4.0 09/14/2014 0834   CL 103 08/18/2017 0813   CL 101 09/14/2014 0834   CO2 25 08/18/2017 0813   CO2 27 09/14/2014 0834   BUN 22 (H) 08/18/2017 0813   BUN 14 09/14/2014 0834   CREATININE 1.00 08/18/2017 0813   CREATININE 1.14 02/05/2015 0949      Component Value Date/Time   CALCIUM 8.6 (L) 08/18/2017 0813   CALCIUM 8.8 09/14/2014 0834   ALKPHOS 35 (L) 08/18/2017 0813   ALKPHOS  49 09/14/2014 0834   AST 19 08/18/2017 0813   AST 21 09/14/2014 0834   ALT 17 08/18/2017 0813   ALT 26 09/14/2014 0834   BILITOT 1.8 (H) 08/18/2017 0813   BILITOT 1.1 (H) 09/14/2014 0834      IMPRESSION: 1. Splenomegaly, splenic volume estimated at 1000 cc. 2. Abnormal number of scattered lymph nodes in the abdomen and pelvis ; some of these are enlarged as detailed above. These may be a manifestation of the patient's CLL. 3. Stranding along some omental adipose tissue in the left lower quadrant may be from a remote omental infarct or less likely appendagitis epiploica. Correlate with any point tenderness in this vicinity. 4. Other imaging findings of potential clinical significance: Coronary atherosclerosis. Aortic valve calcification. Thinning of the left ventricle, query prior myocardial infarct. Several renal cysts including a Bosniak category 2 cyst of the right kidney upper pole. Hypodense lesions in segment 4 of the liver and in both kidneys are technically too small to characterize although statistically likely to be benign. Sigmoid diverticulosis. Aortic Atherosclerosis (ICD10-I70.0). Prostatomegaly. Lumbar spondylosis and degenerative disc disease causing lower lumbar impingement.  Electronically Signed: By: Van Clines M.D. On: 07/29/2017 14:13   ASSESSMENT & PLAN:   CLL (chronic lymphocytic leukemia) ([place]) # CLL Rai 13q del. White count is 108 [predominant lymphocytosis]; hemoglobin 15 platelets 146 stable. Currently on Gazyva s/p cycle #1 day-8; tolerated well except for infusion reaction [as discussed below]  # Proceed with Gazyva today; cycle #1 day 15 today.   # Infusion reaction- G-1-2; improved with medication. Continue pre-medications.   # continue infectious prophylaxis; recommend acyclovir prophylaxis; Bactrim prophylaxis.   # CKD creat 1.2-1.5- stage III/ sec to HTN ; stable.   # Follow up in 2 weeks/labs/MD/infusion.      Cammie Sickle, MD 08/18/2017 4:30 PM

## 2017-08-18 NOTE — Progress Notes (Signed)
Here for follow up. Stated doing well  

## 2017-08-18 NOTE — Assessment & Plan Note (Addendum)
#   CLL Rai 13q del. White count is 108 [predominant lymphocytosis]; hemoglobin 15 platelets 146 stable. Currently on Gazyva s/p cycle #1 day-8; tolerated well except for infusion reaction [as discussed below]  # Proceed with Gazyva today; cycle #1 day 15 today.   # Infusion reaction- G-1-2; improved with medication. Continue pre-medications.   # continue infectious prophylaxis; recommend acyclovir prophylaxis; Bactrim prophylaxis.   # CKD creat 1.2-1.5- stage III/ sec to HTN ; stable.   # Follow up in 2 weeks/labs/MD/infusion.

## 2017-08-27 DIAGNOSIS — Z23 Encounter for immunization: Secondary | ICD-10-CM | POA: Diagnosis not present

## 2017-09-01 ENCOUNTER — Inpatient Hospital Stay: Payer: Medicare Other

## 2017-09-01 ENCOUNTER — Inpatient Hospital Stay (HOSPITAL_BASED_OUTPATIENT_CLINIC_OR_DEPARTMENT_OTHER): Payer: Medicare Other | Admitting: Internal Medicine

## 2017-09-01 VITALS — BP 144/72 | HR 54 | Temp 97.9°F | Resp 20 | Ht 68.0 in | Wt 225.0 lb

## 2017-09-01 DIAGNOSIS — C9110 Chronic lymphocytic leukemia of B-cell type not having achieved remission: Secondary | ICD-10-CM | POA: Diagnosis not present

## 2017-09-01 DIAGNOSIS — Z880 Allergy status to penicillin: Secondary | ICD-10-CM

## 2017-09-01 DIAGNOSIS — N400 Enlarged prostate without lower urinary tract symptoms: Secondary | ICD-10-CM

## 2017-09-01 DIAGNOSIS — I129 Hypertensive chronic kidney disease with stage 1 through stage 4 chronic kidney disease, or unspecified chronic kidney disease: Secondary | ICD-10-CM

## 2017-09-01 DIAGNOSIS — R161 Splenomegaly, not elsewhere classified: Secondary | ICD-10-CM | POA: Diagnosis not present

## 2017-09-01 DIAGNOSIS — N183 Chronic kidney disease, stage 3 (moderate): Secondary | ICD-10-CM | POA: Diagnosis not present

## 2017-09-01 DIAGNOSIS — M109 Gout, unspecified: Secondary | ICD-10-CM

## 2017-09-01 DIAGNOSIS — E7800 Pure hypercholesterolemia, unspecified: Secondary | ICD-10-CM

## 2017-09-01 DIAGNOSIS — N281 Cyst of kidney, acquired: Secondary | ICD-10-CM | POA: Diagnosis not present

## 2017-09-01 DIAGNOSIS — I2510 Atherosclerotic heart disease of native coronary artery without angina pectoris: Secondary | ICD-10-CM

## 2017-09-01 DIAGNOSIS — Z79899 Other long term (current) drug therapy: Secondary | ICD-10-CM

## 2017-09-01 DIAGNOSIS — M5136 Other intervertebral disc degeneration, lumbar region: Secondary | ICD-10-CM

## 2017-09-01 DIAGNOSIS — R61 Generalized hyperhidrosis: Secondary | ICD-10-CM | POA: Diagnosis not present

## 2017-09-01 DIAGNOSIS — I700 Atherosclerosis of aorta: Secondary | ICD-10-CM

## 2017-09-01 DIAGNOSIS — I252 Old myocardial infarction: Secondary | ICD-10-CM

## 2017-09-01 DIAGNOSIS — Z5111 Encounter for antineoplastic chemotherapy: Secondary | ICD-10-CM | POA: Diagnosis not present

## 2017-09-01 DIAGNOSIS — Z888 Allergy status to other drugs, medicaments and biological substances status: Secondary | ICD-10-CM

## 2017-09-01 DIAGNOSIS — G4730 Sleep apnea, unspecified: Secondary | ICD-10-CM | POA: Diagnosis not present

## 2017-09-01 DIAGNOSIS — K5730 Diverticulosis of large intestine without perforation or abscess without bleeding: Secondary | ICD-10-CM

## 2017-09-01 LAB — CBC WITH DIFFERENTIAL/PLATELET
BASOS PCT: 1 %
Basophils Absolute: 0 10*3/uL (ref 0–0.1)
EOS ABS: 0.1 10*3/uL (ref 0–0.7)
Eosinophils Relative: 1 %
HCT: 42.7 % (ref 40.0–52.0)
HEMOGLOBIN: 14.3 g/dL (ref 13.0–18.0)
LYMPHS ABS: 2.2 10*3/uL (ref 1.0–3.6)
Lymphocytes Relative: 28 %
MCH: 30.8 pg (ref 26.0–34.0)
MCHC: 33.6 g/dL (ref 32.0–36.0)
MCV: 91.8 fL (ref 80.0–100.0)
MONO ABS: 0.6 10*3/uL (ref 0.2–1.0)
MONOS PCT: 8 %
NEUTROS PCT: 62 %
Neutro Abs: 4.8 10*3/uL (ref 1.4–6.5)
Platelets: 124 10*3/uL — ABNORMAL LOW (ref 150–440)
RBC: 4.65 MIL/uL (ref 4.40–5.90)
RDW: 16.1 % — AB (ref 11.5–14.5)
WBC: 7.6 10*3/uL (ref 3.8–10.6)

## 2017-09-01 LAB — COMPREHENSIVE METABOLIC PANEL
ALBUMIN: 3.7 g/dL (ref 3.5–5.0)
ALK PHOS: 35 U/L — AB (ref 38–126)
ALT: 23 U/L (ref 17–63)
ANION GAP: 8 (ref 5–15)
AST: 26 U/L (ref 15–41)
BILIRUBIN TOTAL: 1.3 mg/dL — AB (ref 0.3–1.2)
BUN: 25 mg/dL — ABNORMAL HIGH (ref 6–20)
CALCIUM: 8.8 mg/dL — AB (ref 8.9–10.3)
CO2: 25 mmol/L (ref 22–32)
Chloride: 103 mmol/L (ref 101–111)
Creatinine, Ser: 1.16 mg/dL (ref 0.61–1.24)
GFR calc non Af Amer: 56 mL/min — ABNORMAL LOW (ref >60)
Glucose, Bld: 85 mg/dL (ref 65–99)
POTASSIUM: 4 mmol/L (ref 3.5–5.1)
SODIUM: 136 mmol/L (ref 135–145)
TOTAL PROTEIN: 5.8 g/dL — AB (ref 6.5–8.1)

## 2017-09-01 MED ORDER — SODIUM CHLORIDE 0.9 % IV SOLN
20.0000 mg | Freq: Once | INTRAVENOUS | Status: AC
  Administered 2017-09-01: 20 mg via INTRAVENOUS
  Filled 2017-09-01: qty 2

## 2017-09-01 MED ORDER — SODIUM CHLORIDE 0.9 % IV SOLN
1000.0000 mg | Freq: Once | INTRAVENOUS | Status: AC
  Administered 2017-09-01: 1000 mg via INTRAVENOUS
  Filled 2017-09-01: qty 40

## 2017-09-01 MED ORDER — ACETAMINOPHEN 325 MG PO TABS
650.0000 mg | ORAL_TABLET | Freq: Once | ORAL | Status: AC
  Administered 2017-09-01: 650 mg via ORAL
  Filled 2017-09-01: qty 2

## 2017-09-01 MED ORDER — SODIUM CHLORIDE 0.9 % IV SOLN
Freq: Once | INTRAVENOUS | Status: AC
  Administered 2017-09-01: 10:00:00 via INTRAVENOUS
  Filled 2017-09-01: qty 1000

## 2017-09-01 MED ORDER — DIPHENHYDRAMINE HCL 50 MG/ML IJ SOLN
50.0000 mg | Freq: Once | INTRAMUSCULAR | Status: AC
  Administered 2017-09-01: 50 mg via INTRAVENOUS
  Filled 2017-09-01: qty 1

## 2017-09-01 NOTE — Patient Instructions (Signed)
STOP taking dexamethasone prior to next cycle of infusion; NO other changes to other medications.

## 2017-09-01 NOTE — Assessment & Plan Note (Signed)
#   CLL Rai 13q del. White count is 108 [predominant lymphocytosis]; hemoglobin 15 platelets 146 stable. Currently on Gazyva s/p cycle #1 ; tolerated well except for infusion reaction [as discussed below]  # Proceed with Gazyva today; cycle #2 day; Labs today reviewed;  acceptable for treatment today.    # Infusion reaction- G-1-2; improved with medication. Continue pre-medications; except steroids; written instructions given.   # continue infectious prophylaxis; recommend acyclovir prophylaxis; Bactrim prophylaxis.   # CKD creat 1.2-1.5- stage III/ sec to HTN ; stable.   # Follow up in 4 weeks/labs/MD/infusion.

## 2017-09-01 NOTE — Progress Notes (Signed)
Patient here for f/u for CLL and next Gazyva tx.  He has no medical complaints.

## 2017-09-01 NOTE — Progress Notes (Signed)
[place] OFFICE PROGRESS NOTE   SUMMARY OF ONCOLOGIC HISTORY:  Oncology History   3 Lambda positive B-cell CLL, RAI stage 0, lymphocytosis only. 10/08/06 CT of chest, abdomen and pelvis - No evidence of splenomegaly. No evidence of lymphadenopathy in the chest, abdomen, or pelvis. 10/04/06 flow cytometry of peripheral blood - A monoclonal population of lambda positive B-cells was detected. Phenotype consistent with diagnosis of B cell chronic lymphocytic leukemia/small lymphocytic lymphoma. CD38 positive. 03/06/08 CT Chest abdomen Pelvis:No findings to suggest recurrent disease. No adenopathy. 06/04/09: CT chest/abdomen/pelvis: No evidence of lymphadenopathy/splenomegaly. 06/04/10: ct chest/abdomen/pelvis: no evidence of active malignancy within the thorax or  abdomen or pelvis.  # CLL- 13q del; July 2017 [facility] ordered.   # Sep 27th CT scan- LN+ splenomegaly ------------------------------------------------- --  # OCT 3rd 2018- Gazyva [No chlorambucil sec to insurance issues]  # Prostate Biopsy [PSA ~5; Dr.Wolfe]; June 2017; Enlarged prostate- s/p ? cryo Dr.Wolfe.   # CKD [creat 1.5]     CLL (chronic lymphocytic leukemia) ([place])    INTERVAL HISTORY:  A very pleasant 83 year old male patient with above history of CLL Is here currently s/p cycle #1 of Gazyva day 15 appx 2 weeks ago.   Patient has a good appetite. He has been eating a lot. Denies any difficulty sleeping. Admits to a lot of energy.  Patient states that he is feeling much better since starting the treatment.  Denies any shortness of breath or cough or fevers or infections.  Denies any new lumps or bumps.   REVIEW OF SYSTEMS:  A complete 10 point review of system is done which is negative except mentioned above/history of present illness.   PAST MEDICAL HISTORY :  Past Medical History:  Diagnosis Date  . Bladder neck obstruction   . Cataracts, bilateral   . Chronic lymphocytic leukemia (CLL), B-cell (HCC)     Lambda positive B-cell CLL  . Gout   . Hypercholesterolemia   . Hypertension   . Lymphocytosis   . Sleep apnea   . Thrombocytopenia ([place])     PAST SURGICAL HISTORY :   Past Surgical History:  Procedure Laterality Date  . COLONOSCOPY WITH PROPOFOL N/A 09/10/2015   Procedure: COLONOSCOPY WITH PROPOFOL;  Surgeon: Lucilla Lame, MD;  Location: [facility] ENDOSCOPY;  Service: Endoscopy;  Laterality: N/A;  . PROSTATE SURGERY  2008    FAMILY HISTORY :   Family History  Problem Relation Age of Onset  . Lung cancer Mother 79       deceased  . Brain cancer Son 13       pt's only son  . Polycythemia Brother        half brother  . Prostate cancer Brother     SOCIAL HISTORY:   Social History  Substance Use Topics  . Smoking status: Never Smoker  . Smokeless tobacco: Not on file  . Alcohol use No    ALLERGIES:  is allergic to penicillins.  MEDICATIONS:  Current Outpatient Prescriptions  Medication Sig Dispense Refill  . acetaminophen (TYLENOL) 325 MG tablet Take 650 mg by mouth daily. Take 650 mg daily - 2 days prior to [facility] IV infusion    . acyclovir (ZOVIRAX) 400 MG tablet One pill a day [to prevent shingles] 60 tablet 3  . allopurinol (ZYLOPRIM) 100 MG tablet Take 200 mg by mouth daily.     Marland Kitchen amLODipine (NORVASC) 10 MG tablet Take 5 mg by mouth daily. Patient takes a half of the 10 mg tablet to  equal 5 mg every day    . dexamethasone (DECADRON) 4 MG tablet Start 2 days prior to infusion; Take for 2 days. Do not take on the day of infusion. 60 tablet 3  . lisinopril-hydrochlorothiazide (PRINZIDE,ZESTORETIC) 20-12.5 MG tablet Take 1 tablet by mouth daily.    Marland Kitchen loratadine (CLARITIN) 10 MG tablet Take 10 mg by mouth daily. Take 1 tablet daily starting 2 days prior to [facility] IV infusion    . montelukast (SINGULAIR) 10 MG tablet Take 1 tablet (10 mg total) by mouth at bedtime. Start 2 days prior to infusion. Take it for 4 days. 60 tablet 0  . ranitidine (ZANTAC) 150 MG capsule Take 150  mg by mouth 2 (two) times daily. Start taking 2 days prior to [facility] IV infusion    . sulfamethoxazole-trimethoprim (BACTRIM DS,SEPTRA DS) 800-160 MG tablet Take 1 tablet by mouth 3 (three) times a week. 30 tablet 0  . chlorambucil (LEUKERAN) 2 MG tablet Take 25 tablets (50 mg total) by mouth every 14 (fourteen) days. Take on days 1 and 15 of chemotherapy. Repeat every 28 days. Give on an empty stomach 1 hour before or 2 hours after meals. (Patient not taking: Reported on 08/18/2017) 50 tablet 5  . ondansetron (ZOFRAN) 8 MG tablet 1 pill every 8 hours as needed for nausea/vomitting (Patient not taking: Reported on 08/18/2017) 40 tablet 1  . prochlorperazine (COMPAZINE) 10 MG tablet Take 1 tablet (10 mg total) by mouth every 6 (six) hours as needed for nausea or vomiting. (Patient not taking: Reported on 08/18/2017) 40 tablet 1   No current facility-administered medications for this visit.     PHYSICAL EXAMINATION: ECOG PERFORMANCE STATUS: 0 - Asymptomatic  BP (!) 144/72   Pulse (!) 54   Temp 97.9 F (36.6 C) (Tympanic)   Resp 20   Ht 5\' 8"  (1.727 m)   Wt 225 lb (102.1 kg)   BMI 34.21 kg/m   Filed Weights   09/01/17 0843  Weight: 225 lb (102.1 kg)    GENERAL: Well-nourished well-developed; Alert, no distress and comfortable.   He is with his wife.  EYES: no pallor or icterus OROPHARYNX: no thrush or ulceration; good dentition  NECK: supple, no masses felt LYMPH:  no palpable lymphadenopathy in the cervical or inguinal regions. 1-2 cm lymph nodes noted in the right axilla. LUNGS: clear to auscultation and  No wheeze or crackles HEART/CVS: regular rate & rhythm and no murmurs; No lower extremity edema ABDOMEN:abdomen soft, non-tender and normal bowel sounds; positive for splenomegaly at level of umbilicus.  Musculoskeletal:no cyanosis of digits and no clubbing  PSYCH: alert & oriented x 3 with fluent speech NEURO: no focal motor/sensory deficits SKIN:  no rashes or significant  lesions  LABORATORY DATA:  I have reviewed the data as listed    Component Value Date/Time   NA 136 09/01/2017 0756   NA 137 09/14/2014 0834   K 4.0 09/01/2017 0756   K 4.0 09/14/2014 0834   CL 103 09/01/2017 0756   CL 101 09/14/2014 0834   CO2 25 09/01/2017 0756   CO2 27 09/14/2014 0834   GLUCOSE 85 09/01/2017 0756   GLUCOSE 123 (H) 09/14/2014 0834   BUN 25 (H) 09/01/2017 0756   BUN 14 09/14/2014 0834   CREATININE 1.16 09/01/2017 0756   CREATININE 1.14 02/05/2015 0949   CALCIUM 8.8 (L) 09/01/2017 0756   CALCIUM 8.8 09/14/2014 0834   PROT 5.8 (L) 09/01/2017 0756   PROT 6.7 09/14/2014 0834  ALBUMIN 3.7 09/01/2017 0756   ALBUMIN 3.6 09/14/2014 0834   AST 26 09/01/2017 0756   AST 21 09/14/2014 0834   ALT 23 09/01/2017 0756   ALT 26 09/14/2014 0834   ALKPHOS 35 (L) 09/01/2017 0756   ALKPHOS 49 09/14/2014 0834   BILITOT 1.3 (H) 09/01/2017 0756   BILITOT 1.1 (H) 09/14/2014 0834   GFRNONAA 56 (L) 09/01/2017 0756   GFRNONAA 60 (L) 02/05/2015 0949   GFRAA >60 09/01/2017 0756   GFRAA >60 02/05/2015 0949    No results found for: SPEP, UPEP  Lab Results  Component Value Date   WBC 7.6 09/01/2017   NEUTROABS 4.8 09/01/2017   HGB 14.3 09/01/2017   HCT 42.7 09/01/2017   MCV 91.8 09/01/2017   PLT 124 (L) 09/01/2017      Chemistry      Component Value Date/Time   NA 136 09/01/2017 0756   NA 137 09/14/2014 0834   K 4.0 09/01/2017 0756   K 4.0 09/14/2014 0834   CL 103 09/01/2017 0756   CL 101 09/14/2014 0834   CO2 25 09/01/2017 0756   CO2 27 09/14/2014 0834   BUN 25 (H) 09/01/2017 0756   BUN 14 09/14/2014 0834   CREATININE 1.16 09/01/2017 0756   CREATININE 1.14 02/05/2015 0949      Component Value Date/Time   CALCIUM 8.8 (L) 09/01/2017 0756   CALCIUM 8.8 09/14/2014 0834   ALKPHOS 35 (L) 09/01/2017 0756   ALKPHOS 49 09/14/2014 0834   AST 26 09/01/2017 0756   AST 21 09/14/2014 0834   ALT 23 09/01/2017 0756   ALT 26 09/14/2014 0834   BILITOT 1.3 (H) 09/01/2017  0756   BILITOT 1.1 (H) 09/14/2014 0834      IMPRESSION: 1. Splenomegaly, splenic volume estimated at 1000 cc. 2. Abnormal number of scattered lymph nodes in the abdomen and pelvis ; some of these are enlarged as detailed above. These may be a manifestation of the patient's CLL. 3. Stranding along some omental adipose tissue in the left lower quadrant may be from a remote omental infarct or less likely appendagitis epiploica. Correlate with any point tenderness in this vicinity. 4. Other imaging findings of potential clinical significance: Coronary atherosclerosis. Aortic valve calcification. Thinning of the left ventricle, query prior myocardial infarct. Several renal cysts including a Bosniak category 2 cyst of the right kidney upper pole. Hypodense lesions in segment 4 of the liver and in both kidneys are technically too small to characterize although statistically likely to be benign. Sigmoid diverticulosis. Aortic Atherosclerosis (ICD10-I70.0). Prostatomegaly. Lumbar spondylosis and degenerative disc disease causing lower lumbar impingement.  Electronically Signed: By: Van Clines M.D. On: 07/29/2017 14:13   ASSESSMENT & PLAN:   CLL (chronic lymphocytic leukemia) ([place]) # CLL Rai 13q del. White count is 108 [predominant lymphocytosis]; hemoglobin 15 platelets 146 stable. Currently on Gazyva s/p cycle #1 ; tolerated well except for infusion reaction [as discussed below]  # Proceed with Gazyva today; cycle #2 day; Labs today reviewed;  acceptable for treatment today.    # Infusion reaction- G-1-2; improved with medication. Continue pre-medications; except steroids; written instructions given.   # continue infectious prophylaxis; recommend acyclovir prophylaxis; Bactrim prophylaxis.   # CKD creat 1.2-1.5- stage III/ sec to HTN ; stable.   # Follow up in 4 weeks/labs/MD/infusion.      Cammie Sickle, MD 09/02/2017 10:23 AM

## 2017-09-29 ENCOUNTER — Inpatient Hospital Stay: Payer: Medicare Other

## 2017-09-29 ENCOUNTER — Inpatient Hospital Stay: Payer: Medicare Other | Attending: Internal Medicine | Admitting: Internal Medicine

## 2017-09-29 VITALS — BP 127/70 | HR 55 | Temp 97.7°F | Resp 16 | Wt 232.0 lb

## 2017-09-29 DIAGNOSIS — G4730 Sleep apnea, unspecified: Secondary | ICD-10-CM | POA: Insufficient documentation

## 2017-09-29 DIAGNOSIS — M5136 Other intervertebral disc degeneration, lumbar region: Secondary | ICD-10-CM

## 2017-09-29 DIAGNOSIS — M109 Gout, unspecified: Secondary | ICD-10-CM | POA: Diagnosis not present

## 2017-09-29 DIAGNOSIS — N183 Chronic kidney disease, stage 3 (moderate): Secondary | ICD-10-CM | POA: Diagnosis not present

## 2017-09-29 DIAGNOSIS — Z79899 Other long term (current) drug therapy: Secondary | ICD-10-CM | POA: Insufficient documentation

## 2017-09-29 DIAGNOSIS — C9110 Chronic lymphocytic leukemia of B-cell type not having achieved remission: Secondary | ICD-10-CM

## 2017-09-29 DIAGNOSIS — Z5112 Encounter for antineoplastic immunotherapy: Secondary | ICD-10-CM | POA: Diagnosis not present

## 2017-09-29 DIAGNOSIS — E7800 Pure hypercholesterolemia, unspecified: Secondary | ICD-10-CM | POA: Diagnosis not present

## 2017-09-29 DIAGNOSIS — Z880 Allergy status to penicillin: Secondary | ICD-10-CM | POA: Diagnosis not present

## 2017-09-29 DIAGNOSIS — I2510 Atherosclerotic heart disease of native coronary artery without angina pectoris: Secondary | ICD-10-CM | POA: Diagnosis not present

## 2017-09-29 DIAGNOSIS — R161 Splenomegaly, not elsewhere classified: Secondary | ICD-10-CM | POA: Insufficient documentation

## 2017-09-29 DIAGNOSIS — I129 Hypertensive chronic kidney disease with stage 1 through stage 4 chronic kidney disease, or unspecified chronic kidney disease: Secondary | ICD-10-CM | POA: Insufficient documentation

## 2017-09-29 DIAGNOSIS — Z801 Family history of malignant neoplasm of trachea, bronchus and lung: Secondary | ICD-10-CM | POA: Insufficient documentation

## 2017-09-29 DIAGNOSIS — K5730 Diverticulosis of large intestine without perforation or abscess without bleeding: Secondary | ICD-10-CM | POA: Diagnosis not present

## 2017-09-29 DIAGNOSIS — Z808 Family history of malignant neoplasm of other organs or systems: Secondary | ICD-10-CM | POA: Diagnosis not present

## 2017-09-29 LAB — CBC WITH DIFFERENTIAL/PLATELET
BASOS ABS: 0.1 10*3/uL (ref 0–0.1)
Basophils Relative: 1 %
Eosinophils Absolute: 0 10*3/uL (ref 0–0.7)
Eosinophils Relative: 1 %
HEMATOCRIT: 42.2 % (ref 40.0–52.0)
Hemoglobin: 14.3 g/dL (ref 13.0–18.0)
LYMPHS PCT: 29 %
Lymphs Abs: 1.9 10*3/uL (ref 1.0–3.6)
MCH: 31.2 pg (ref 26.0–34.0)
MCHC: 33.9 g/dL (ref 32.0–36.0)
MCV: 92.3 fL (ref 80.0–100.0)
Monocytes Absolute: 0.6 10*3/uL (ref 0.2–1.0)
Monocytes Relative: 9 %
NEUTROS ABS: 4 10*3/uL (ref 1.4–6.5)
Neutrophils Relative %: 60 %
Platelets: 144 10*3/uL — ABNORMAL LOW (ref 150–440)
RBC: 4.57 MIL/uL (ref 4.40–5.90)
RDW: 16.8 % — ABNORMAL HIGH (ref 11.5–14.5)
WBC: 6.6 10*3/uL (ref 3.8–10.6)

## 2017-09-29 LAB — COMPREHENSIVE METABOLIC PANEL
ALBUMIN: 3.8 g/dL (ref 3.5–5.0)
ALT: 21 U/L (ref 17–63)
ANION GAP: 7 (ref 5–15)
AST: 22 U/L (ref 15–41)
Alkaline Phosphatase: 34 U/L — ABNORMAL LOW (ref 38–126)
BILIRUBIN TOTAL: 1 mg/dL (ref 0.3–1.2)
BUN: 19 mg/dL (ref 6–20)
CO2: 27 mmol/L (ref 22–32)
Calcium: 8.8 mg/dL — ABNORMAL LOW (ref 8.9–10.3)
Chloride: 105 mmol/L (ref 101–111)
Creatinine, Ser: 1.31 mg/dL — ABNORMAL HIGH (ref 0.61–1.24)
GFR calc Af Amer: 56 mL/min — ABNORMAL LOW (ref >60)
GFR, EST NON AFRICAN AMERICAN: 48 mL/min — AB (ref >60)
GLUCOSE: 93 mg/dL (ref 65–99)
POTASSIUM: 4.1 mmol/L (ref 3.5–5.1)
Sodium: 139 mmol/L (ref 135–145)
TOTAL PROTEIN: 5.9 g/dL — AB (ref 6.5–8.1)

## 2017-09-29 LAB — LACTATE DEHYDROGENASE: LDH: 160 U/L (ref 98–192)

## 2017-09-29 MED ORDER — ACETAMINOPHEN 325 MG PO TABS
650.0000 mg | ORAL_TABLET | Freq: Once | ORAL | Status: AC
  Administered 2017-09-29: 650 mg via ORAL
  Filled 2017-09-29: qty 2

## 2017-09-29 MED ORDER — DIPHENHYDRAMINE HCL 50 MG/ML IJ SOLN
50.0000 mg | Freq: Once | INTRAMUSCULAR | Status: AC
  Administered 2017-09-29: 50 mg via INTRAVENOUS
  Filled 2017-09-29: qty 1

## 2017-09-29 MED ORDER — SODIUM CHLORIDE 0.9 % IV SOLN
20.0000 mg | Freq: Once | INTRAVENOUS | Status: AC
  Administered 2017-09-29: 20 mg via INTRAVENOUS
  Filled 2017-09-29: qty 2

## 2017-09-29 MED ORDER — OBINUTUZUMAB CHEMO INJECTION 1000 MG/40ML
1000.0000 mg | Freq: Once | INTRAVENOUS | Status: AC
  Administered 2017-09-29: 1000 mg via INTRAVENOUS
  Filled 2017-09-29: qty 40

## 2017-09-29 MED ORDER — SODIUM CHLORIDE 0.9 % IV SOLN
Freq: Once | INTRAVENOUS | Status: AC
  Administered 2017-09-29: 10:00:00 via INTRAVENOUS
  Filled 2017-09-29: qty 1000

## 2017-09-29 NOTE — Progress Notes (Signed)
[place] OFFICE PROGRESS NOTE   SUMMARY OF ONCOLOGIC HISTORY:  Oncology History   3 Lambda positive B-cell CLL, RAI stage 0, lymphocytosis only. 10/08/06 CT of chest, abdomen and pelvis - No evidence of splenomegaly. No evidence of lymphadenopathy in the chest, abdomen, or pelvis. 10/04/06 flow cytometry of peripheral blood - A monoclonal population of lambda positive B-cells was detected. Phenotype consistent with diagnosis of B cell chronic lymphocytic leukemia/small lymphocytic lymphoma. CD38 positive. 03/06/08 CT Chest abdomen Pelvis:No findings to suggest recurrent disease. No adenopathy. 06/04/09: CT chest/abdomen/pelvis: No evidence of lymphadenopathy/splenomegaly. 06/04/10: ct chest/abdomen/pelvis: no evidence of active malignancy within the thorax or  abdomen or pelvis.  # CLL- 13q del; July 2017 [facility] ordered.   # Sep 27th CT scan- LN+ splenomegaly ------------------------------------------------- --  # OCT 3rd 2018- Gazyva [No chlorambucil sec to insurance issues]  # Prostate Biopsy [PSA ~5; Dr.Wolfe]; June 2017; Enlarged prostate- s/p ? cryo Dr.Wolfe.   # CKD [creat 1.5]     CLL (chronic lymphocytic leukemia) (HCC)    INTERVAL HISTORY:  A very pleasant 83 year old male patient with above history of CLL Is here currently s/p cycle #2 approximately a month ago.  Patient denies any skin rash or nausea vomiting.  Patient states that he is feeling much better since starting the treatment.  Denies any shortness of breath or cough or fevers or infections.  Denies any new lumps or bumps.   REVIEW OF SYSTEMS:  A complete 10 point review of system is done which is negative except mentioned above/history of present illness.   PAST MEDICAL HISTORY :  Past Medical History:  Diagnosis Date  . Bladder neck obstruction   . Cataracts, bilateral   . Chronic lymphocytic leukemia (CLL), B-cell (HCC)    Lambda positive B-cell CLL  . Gout   . Hypercholesterolemia   .  Hypertension   . Lymphocytosis   . Sleep apnea   . Thrombocytopenia ([place])     PAST SURGICAL HISTORY :   Past Surgical History:  Procedure Laterality Date  . COLONOSCOPY WITH PROPOFOL N/A 09/10/2015   Procedure: COLONOSCOPY WITH PROPOFOL;  Surgeon: Lucilla Lame, MD;  Location: [facility] ENDOSCOPY;  Service: Endoscopy;  Laterality: N/A;  . PROSTATE SURGERY  2008    FAMILY HISTORY :   Family History  Problem Relation Age of Onset  . Lung cancer Mother 5       deceased  . Brain cancer Son 35       pt's only son  . Polycythemia Brother        half brother  . Prostate cancer Brother     SOCIAL HISTORY:   Social History   Tobacco Use  . Smoking status: Never Smoker  Substance Use Topics  . Alcohol use: No    Alcohol/week: 0.0 oz  . Drug use: No    ALLERGIES:  is allergic to penicillins.  MEDICATIONS:  Current Outpatient Medications  Medication Sig Dispense Refill  . acetaminophen (TYLENOL) 325 MG tablet Take 650 mg by mouth daily. Take 650 mg daily - 2 days prior to [facility] IV infusion    . acyclovir (ZOVIRAX) 400 MG tablet One pill a day [to prevent shingles] 60 tablet 3  . allopurinol (ZYLOPRIM) 100 MG tablet Take 200 mg by mouth daily.     Marland Kitchen amLODipine (NORVASC) 10 MG tablet Take 5 mg by mouth daily. Patient takes a half of the 10 mg tablet to equal 5 mg every day    . chlorambucil (  LEUKERAN) 2 MG tablet Take 25 tablets (50 mg total) by mouth every 14 (fourteen) days. Take on days 1 and 15 of chemotherapy. Repeat every 28 days. Give on an empty stomach 1 hour before or 2 hours after meals. 50 tablet 5  . dexamethasone (DECADRON) 4 MG tablet Start 2 days prior to infusion; Take for 2 days. Do not take on the day of infusion. 60 tablet 3  . lisinopril-hydrochlorothiazide (PRINZIDE,ZESTORETIC) 20-12.5 MG tablet Take 1 tablet by mouth daily.    Marland Kitchen loratadine (CLARITIN) 10 MG tablet Take 10 mg by mouth daily. Take 1 tablet daily starting 2 days prior to [facility] IV infusion    .  montelukast (SINGULAIR) 10 MG tablet Take 1 tablet (10 mg total) by mouth at bedtime. Start 2 days prior to infusion. Take it for 4 days. 60 tablet 0  . ondansetron (ZOFRAN) 8 MG tablet 1 pill every 8 hours as needed for nausea/vomitting 40 tablet 1  . prochlorperazine (COMPAZINE) 10 MG tablet Take 1 tablet (10 mg total) by mouth every 6 (six) hours as needed for nausea or vomiting. 40 tablet 1  . ranitidine (ZANTAC) 150 MG capsule Take 150 mg by mouth 2 (two) times daily. Start taking 2 days prior to [facility] IV infusion    . sulfamethoxazole-trimethoprim (BACTRIM DS,SEPTRA DS) 800-160 MG tablet Take 1 tablet by mouth 3 (three) times a week. 30 tablet 0   No current facility-administered medications for this visit.     PHYSICAL EXAMINATION: ECOG PERFORMANCE STATUS: 0 - Asymptomatic  BP 127/70 (BP Location: Left Arm, Patient Position: Sitting)   Pulse (!) 55   Temp 97.7 F (36.5 C) (Tympanic)   Resp 16   Wt 232 lb (105.2 kg)   BMI 35.28 kg/m   Filed Weights   09/29/17 0845  Weight: 232 lb (105.2 kg)    GENERAL: Well-nourished well-developed; Alert, no distress and comfortable.   He is with his wife.  EYES: no pallor or icterus OROPHARYNX: no thrush or ulceration; good dentition  NECK: supple, no masses felt LYMPH:  no palpable lymphadenopathy in the cervical or inguinal regions. 1-2 cm lymph nodes noted in the right axilla. LUNGS: clear to auscultation and  No wheeze or crackles HEART/CVS: regular rate & rhythm and no murmurs; No lower extremity edema ABDOMEN:abdomen soft, non-tender and normal bowel sounds; positive for splenomegaly at level of umbilicus.  Musculoskeletal:no cyanosis of digits and no clubbing  PSYCH: alert & oriented x 3 with fluent speech NEURO: no focal motor/sensory deficits SKIN:  no rashes or significant lesions  LABORATORY DATA:  I have reviewed the data as listed    Component Value Date/Time   NA 139 09/29/2017 0806   NA 137 09/14/2014 0834   K 4.1  09/29/2017 0806   K 4.0 09/14/2014 0834   CL 105 09/29/2017 0806   CL 101 09/14/2014 0834   CO2 27 09/29/2017 0806   CO2 27 09/14/2014 0834   GLUCOSE 93 09/29/2017 0806   GLUCOSE 123 (H) 09/14/2014 0834   BUN 19 09/29/2017 0806   BUN 14 09/14/2014 0834   CREATININE 1.31 (H) 09/29/2017 0806   CREATININE 1.14 02/05/2015 0949   CALCIUM 8.8 (L) 09/29/2017 0806   CALCIUM 8.8 09/14/2014 0834   PROT 5.9 (L) 09/29/2017 0806   PROT 6.7 09/14/2014 0834   ALBUMIN 3.8 09/29/2017 0806   ALBUMIN 3.6 09/14/2014 0834   AST 22 09/29/2017 0806   AST 21 09/14/2014 0834   ALT 21 09/29/2017 0806  ALT 26 09/14/2014 0834   ALKPHOS 34 (L) 09/29/2017 0806   ALKPHOS 49 09/14/2014 0834   BILITOT 1.0 09/29/2017 0806   BILITOT 1.1 (H) 09/14/2014 0834   GFRNONAA 48 (L) 09/29/2017 0806   GFRNONAA 60 (L) 02/05/2015 0949   GFRAA 56 (L) 09/29/2017 0806   GFRAA >60 02/05/2015 0949    No results found for: SPEP, UPEP  Lab Results  Component Value Date   WBC 6.6 09/29/2017   NEUTROABS 4.0 09/29/2017   HGB 14.3 09/29/2017   HCT 42.2 09/29/2017   MCV 92.3 09/29/2017   PLT 144 (L) 09/29/2017      Chemistry      Component Value Date/Time   NA 139 09/29/2017 0806   NA 137 09/14/2014 0834   K 4.1 09/29/2017 0806   K 4.0 09/14/2014 0834   CL 105 09/29/2017 0806   CL 101 09/14/2014 0834   CO2 27 09/29/2017 0806   CO2 27 09/14/2014 0834   BUN 19 09/29/2017 0806   BUN 14 09/14/2014 0834   CREATININE 1.31 (H) 09/29/2017 0806   CREATININE 1.14 02/05/2015 0949      Component Value Date/Time   CALCIUM 8.8 (L) 09/29/2017 0806   CALCIUM 8.8 09/14/2014 0834   ALKPHOS 34 (L) 09/29/2017 0806   ALKPHOS 49 09/14/2014 0834   AST 22 09/29/2017 0806   AST 21 09/14/2014 0834   ALT 21 09/29/2017 0806   ALT 26 09/14/2014 0834   BILITOT 1.0 09/29/2017 0806   BILITOT 1.1 (H) 09/14/2014 0834      IMPRESSION: 1. Splenomegaly, splenic volume estimated at 1000 cc. 2. Abnormal number of scattered lymph nodes  in the abdomen and pelvis ; some of these are enlarged as detailed above. These may be a manifestation of the patient's CLL. 3. Stranding along some omental adipose tissue in the left lower quadrant may be from a remote omental infarct or less likely appendagitis epiploica. Correlate with any point tenderness in this vicinity. 4. Other imaging findings of potential clinical significance: Coronary atherosclerosis. Aortic valve calcification. Thinning of the left ventricle, query prior myocardial infarct. Several renal cysts including a Bosniak category 2 cyst of the right kidney upper pole. Hypodense lesions in segment 4 of the liver and in both kidneys are technically too small to characterize although statistically likely to be benign. Sigmoid diverticulosis. Aortic Atherosclerosis (ICD10-I70.0). Prostatomegaly. Lumbar spondylosis and degenerative disc disease causing lower lumbar impingement.  Electronically Signed: By: Van Clines M.D. On: 07/29/2017 14:13   ASSESSMENT & PLAN:   CLL (chronic lymphocytic leukemia) ([place]) # CLL Rai 13q del. White count is 108 [predominant lymphocytosis]; hemoglobin 15 platelets 146 stable. Currently on Gazyva s/p cycle #2 ; tolerated well except for infusion reaction [as discussed below]; significant response noted.   # Proceed with Gazyva today; cycle #3 day; Labs today reviewed;  acceptable for treatment today.    # Infusion reaction- G-1-2; improved with medication. Continue pre-medications; except steroids; written instructions given.   # CKD stage III- 1.3/stable. Discussed increasing fluid intake.   # continue infectious prophylaxis; recommend acyclovir prophylaxis; Bactrim prophylaxis.   # Follow up in 6 weeks [Jan 12th]/labs/MD/infusion.      Cammie Sickle, MD 09/30/2017 9:08 AM

## 2017-09-29 NOTE — Assessment & Plan Note (Addendum)
#   CLL Rai 13q del. White count is 108 [predominant lymphocytosis]; hemoglobin 15 platelets 146 stable. Currently on Gazyva s/p cycle #2 ; tolerated well except for infusion reaction [as discussed below]; significant response noted.   # Proceed with Gazyva today; cycle #3 day; Labs today reviewed;  acceptable for treatment today.    # Infusion reaction- G-1-2; improved with medication. Continue pre-medications; except steroids; written instructions given.   # CKD stage III- 1.3/stable. Discussed increasing fluid intake.   # continue infectious prophylaxis; recommend acyclovir prophylaxis; Bactrim prophylaxis.   # Follow up in 6 weeks [Jan 12th]/labs/MD/infusion.

## 2017-10-05 ENCOUNTER — Emergency Department: Payer: Medicare Other

## 2017-10-05 ENCOUNTER — Other Ambulatory Visit: Payer: Self-pay

## 2017-10-05 ENCOUNTER — Ambulatory Visit
Admission: RE | Admit: 2017-10-05 | Discharge: 2017-10-05 | Disposition: A | Discharge status: Home / Self Care | Payer: Medicare Other | Source: Ambulatory Visit | Attending: Internal Medicine | Admitting: Internal Medicine

## 2017-10-05 ENCOUNTER — Inpatient Hospital Stay: Payer: Medicare Other

## 2017-10-05 ENCOUNTER — Emergency Department
Admission: EM | Admit: 2017-10-05 | Discharge: 2017-10-05 | Disposition: A | Discharge status: Home / Self Care | Payer: Medicare Other | Attending: Emergency Medicine | Admitting: Emergency Medicine

## 2017-10-05 ENCOUNTER — Inpatient Hospital Stay: Payer: Medicare Other | Attending: Internal Medicine | Admitting: Internal Medicine

## 2017-10-05 VITALS — BP 153/80 | HR 52 | Temp 94.9°F | Resp 18 | Wt 224.2 lb

## 2017-10-05 DIAGNOSIS — R0602 Shortness of breath: Secondary | ICD-10-CM

## 2017-10-05 DIAGNOSIS — C9110 Chronic lymphocytic leukemia of B-cell type not having achieved remission: Secondary | ICD-10-CM

## 2017-10-05 DIAGNOSIS — N183 Chronic kidney disease, stage 3 (moderate): Secondary | ICD-10-CM | POA: Diagnosis not present

## 2017-10-05 DIAGNOSIS — I4891 Unspecified atrial fibrillation: Secondary | ICD-10-CM | POA: Diagnosis not present

## 2017-10-05 DIAGNOSIS — Z808 Family history of malignant neoplasm of other organs or systems: Secondary | ICD-10-CM | POA: Insufficient documentation

## 2017-10-05 DIAGNOSIS — Z79899 Other long term (current) drug therapy: Secondary | ICD-10-CM | POA: Insufficient documentation

## 2017-10-05 DIAGNOSIS — R161 Splenomegaly, not elsewhere classified: Secondary | ICD-10-CM | POA: Insufficient documentation

## 2017-10-05 DIAGNOSIS — R7989 Other specified abnormal findings of blood chemistry: Secondary | ICD-10-CM | POA: Insufficient documentation

## 2017-10-05 DIAGNOSIS — I2510 Atherosclerotic heart disease of native coronary artery without angina pectoris: Secondary | ICD-10-CM | POA: Diagnosis not present

## 2017-10-05 DIAGNOSIS — K5730 Diverticulosis of large intestine without perforation or abscess without bleeding: Secondary | ICD-10-CM | POA: Diagnosis not present

## 2017-10-05 DIAGNOSIS — I129 Hypertensive chronic kidney disease with stage 1 through stage 4 chronic kidney disease, or unspecified chronic kidney disease: Secondary | ICD-10-CM

## 2017-10-05 DIAGNOSIS — Z880 Allergy status to penicillin: Secondary | ICD-10-CM | POA: Insufficient documentation

## 2017-10-05 DIAGNOSIS — I252 Old myocardial infarction: Secondary | ICD-10-CM | POA: Diagnosis not present

## 2017-10-05 DIAGNOSIS — R0600 Dyspnea, unspecified: Secondary | ICD-10-CM

## 2017-10-05 DIAGNOSIS — M109 Gout, unspecified: Secondary | ICD-10-CM | POA: Diagnosis not present

## 2017-10-05 DIAGNOSIS — E7800 Pure hypercholesterolemia, unspecified: Secondary | ICD-10-CM | POA: Insufficient documentation

## 2017-10-05 DIAGNOSIS — C9190 Lymphoid leukemia, unspecified not having achieved remission: Secondary | ICD-10-CM | POA: Insufficient documentation

## 2017-10-05 DIAGNOSIS — G4730 Sleep apnea, unspecified: Secondary | ICD-10-CM | POA: Diagnosis not present

## 2017-10-05 DIAGNOSIS — I700 Atherosclerosis of aorta: Secondary | ICD-10-CM

## 2017-10-05 DIAGNOSIS — Z7901 Long term (current) use of anticoagulants: Secondary | ICD-10-CM | POA: Insufficient documentation

## 2017-10-05 DIAGNOSIS — I10 Essential (primary) hypertension: Secondary | ICD-10-CM | POA: Diagnosis not present

## 2017-10-05 DIAGNOSIS — Z801 Family history of malignant neoplasm of trachea, bronchus and lung: Secondary | ICD-10-CM | POA: Insufficient documentation

## 2017-10-05 LAB — CBC WITH DIFFERENTIAL/PLATELET
BASOS ABS: 0.1 10*3/uL (ref 0–0.1)
BASOS PCT: 1 %
EOS ABS: 0.1 10*3/uL (ref 0–0.7)
EOS PCT: 2 %
HCT: 47 % (ref 40.0–52.0)
Hemoglobin: 15.9 g/dL (ref 13.0–18.0)
Lymphocytes Relative: 27 %
Lymphs Abs: 1.9 10*3/uL (ref 1.0–3.6)
MCH: 30.9 pg (ref 26.0–34.0)
MCHC: 33.9 g/dL (ref 32.0–36.0)
MCV: 91.3 fL (ref 80.0–100.0)
MONO ABS: 0.7 10*3/uL (ref 0.2–1.0)
MONOS PCT: 9 %
NEUTROS ABS: 4.4 10*3/uL (ref 1.4–6.5)
Neutrophils Relative %: 61 %
PLATELETS: 131 10*3/uL — AB (ref 150–440)
RBC: 5.15 MIL/uL (ref 4.40–5.90)
RDW: 17.1 % — AB (ref 11.5–14.5)
WBC: 7.2 10*3/uL (ref 3.8–10.6)

## 2017-10-05 LAB — TROPONIN I
TROPONIN I: 0.03 ng/mL — AB (ref <0.03)
Troponin I: <0.03 ng/mL (ref <0.03)

## 2017-10-05 LAB — COMPREHENSIVE METABOLIC PANEL
ALBUMIN: 4 g/dL (ref 3.5–5.0)
ALK PHOS: 41 U/L (ref 38–126)
ALT: 57 U/L (ref 17–63)
ANION GAP: 8 (ref 5–15)
AST: 38 U/L (ref 15–41)
BILIRUBIN TOTAL: 1.7 mg/dL — AB (ref 0.3–1.2)
BUN: 19 mg/dL (ref 6–20)
CALCIUM: 8.3 mg/dL — AB (ref 8.9–10.3)
CO2: 25 mmol/L (ref 22–32)
CREATININE: 1.23 mg/dL (ref 0.61–1.24)
Chloride: 102 mmol/L (ref 101–111)
GFR calc Af Amer: >60 mL/min (ref >60)
GFR calc non Af Amer: 52 mL/min — ABNORMAL LOW (ref >60)
GLUCOSE: 102 mg/dL — AB (ref 65–99)
Potassium: 3.9 mmol/L (ref 3.5–5.1)
SODIUM: 135 mmol/L (ref 135–145)
Total Protein: 6.2 g/dL — ABNORMAL LOW (ref 6.5–8.1)

## 2017-10-05 LAB — CBC
HEMATOCRIT: 47.2 % (ref 40.0–52.0)
HEMOGLOBIN: 15.8 g/dL (ref 13.0–18.0)
MCH: 30.8 pg (ref 26.0–34.0)
MCHC: 33.5 g/dL (ref 32.0–36.0)
MCV: 91.8 fL (ref 80.0–100.0)
Platelets: 144 10*3/uL — ABNORMAL LOW (ref 150–440)
RBC: 5.14 MIL/uL (ref 4.40–5.90)
RDW: 16.9 % — ABNORMAL HIGH (ref 11.5–14.5)
WBC: 6.9 10*3/uL (ref 3.8–10.6)

## 2017-10-05 LAB — BASIC METABOLIC PANEL
ANION GAP: 9 (ref 5–15)
BUN: 19 mg/dL (ref 6–20)
CHLORIDE: 102 mmol/L (ref 101–111)
CO2: 26 mmol/L (ref 22–32)
Calcium: 8.7 mg/dL — ABNORMAL LOW (ref 8.9–10.3)
Creatinine, Ser: 1.28 mg/dL — ABNORMAL HIGH (ref 0.61–1.24)
GFR calc Af Amer: 58 mL/min — ABNORMAL LOW (ref >60)
GFR, EST NON AFRICAN AMERICAN: 50 mL/min — AB (ref >60)
Glucose, Bld: 180 mg/dL — ABNORMAL HIGH (ref 65–99)
POTASSIUM: 3.5 mmol/L (ref 3.5–5.1)
SODIUM: 137 mmol/L (ref 135–145)

## 2017-10-05 LAB — CKMB (ARMC ONLY): CK, MB: 4 ng/mL (ref 0.5–5.0)

## 2017-10-05 LAB — BRAIN NATRIURETIC PEPTIDE: B NATRIURETIC PEPTIDE 5: 111 pg/mL — AB (ref 0.0–100.0)

## 2017-10-05 LAB — CK

## 2017-10-05 MED ORDER — APIXABAN 5 MG PO TABS: 5.0000 mg | ORAL_TABLET | Freq: Two times a day (BID) | ORAL | 0 refills | Status: DC

## 2017-10-05 MED ORDER — APIXABAN 5 MG PO TABS
5.0000 mg | ORAL_TABLET | Freq: Two times a day (BID) | ORAL | Status: DC
  Administered 2017-10-05: 5 mg via ORAL
  Filled 2017-10-05: qty 1

## 2017-10-05 NOTE — ED Notes (Signed)
Pt ambulatory from Room 4 around the nursing station and back to the room maintaining O2 sats at 98% EDP notified.

## 2017-10-05 NOTE — ED Notes (Signed)
Pt ambulatory upon discharge. Pt and wife verbalized understanding of discharge instructions, prescription and follow-up appointment tomorrow morning with cardiology. VSS. Skin warm and dry. A&O x4.

## 2017-10-05 NOTE — ED Triage Notes (Signed)
Pt went to PCP today and they ordered CXR and labs - they called the pt back and told him that the blood work and cxr were abnormal and that he needed to come to the ED for eval - pt is a known chemo pt for leukemia He started Nov 30th with shortness of breath and had to sit up in a chair all night to breath easy - pt reports that today he is feeling fine

## 2017-10-05 NOTE — Progress Notes (Signed)
Here as add on -per pt after nov 29 - had  chemo  Treatment - sx he said  On nov 30-dec 1started he had difficulty taking a deep breath-felt like he was suffocating " I should have called rescue" stated  -stated if felt like " I was going to panic" no tingling of arms and legs per pt. Stated no symptoms  Stated he goes to [place] for medical tx.

## 2017-10-05 NOTE — Assessment & Plan Note (Addendum)
#   CLL Rai 13q del. White count is 108 [predominant lymphocytosis]; hemoglobin 15 platelets 146 stable. Currently on Gazyva s/p cycle #3 ; tolerated well except for Dyspnea post infuson[as discussed below]; significant improvement of the blood counts so far.  # Proceed with Gazyva  cycle #4 day-as planned/in approximately 6 weeks.  #Transient sob/ no chest pain-for many hours-cardiac versus post infusion reaction.  Check cxr; bnp; ck; troponin. Okay to take baby asprin. ? 2 d echo.   # Infusion reaction- G-1-2; improved with medication. Continue pre-medications; except steroids.   # CKD stage III- 1.3/stable. Discussed increasing fluid intake.   # continue infectious prophylaxis; recommend acyclovir prophylaxis; Bactrim prophylaxis.   # Follow up in 6 weeks [Jan 12th]/labs/MD/infusion; keep appt as planned call pt- [phone] [H]  Addendum: Question non-STEMI; patient's troponin elevated at 0.03; called the patient and discussed with the patient the importance of EKG/possible 2D echo and further workup.  Recommend go to the emergency room.  He agrees.

## 2017-10-05 NOTE — ED Provider Notes (Signed)
[facility] Emergency Department Provider Note ____________________________________________   I have reviewed the triage vital signs and the triage nursing note.  HISTORY  Chief Complaint Shortness of Breath   Historian Patient  HPI Brent Hoover is a 83 y.o. male presents accompanied by his wife, from clinic where he had stopped at oncology clinic due to complaint of dyspnea for Friday evening which is now improving.  He states he just had some questions about although it is improving at this point.  He apparently had blood work as well as chest x-ray, and was told to go to the ER due to elevated troponin and abnormality on chest x-ray.  Patient states that on Friday he had no chest pain or pleuritic chest pain.  He has had no coughing or fever.  He felt dyspneic and he tried his nebulizer and stated he did not feel like it worked much so he just gave up on that.  Since Friday he states that he is actually been improving and today he really does not have dyspnea at all.  He lives at home and has stairs and has done fine with that.  No leg swelling or calf pain.  No prior history of PE or DVT.   Past Medical History:  Diagnosis Date  . Bladder neck obstruction   . Cataracts, bilateral   . Chronic lymphocytic leukemia (CLL), B-cell (HCC)    Lambda positive B-cell CLL  . Gout   . Hypercholesterolemia   . Hypertension   . Lymphocytosis   . Sleep apnea   . Thrombocytopenia [facility])     Patient Active Problem List   Diagnosis Date Noted  . Splenomegaly 07/27/2017  . CLL (chronic lymphocytic leukemia) ([place]) 04/30/2015    Past Surgical History:  Procedure Laterality Date  . COLONOSCOPY WITH PROPOFOL N/A 09/10/2015   Procedure: COLONOSCOPY WITH PROPOFOL;  Surgeon: Lucilla Lame, MD;  Location: [facility] ENDOSCOPY;  Service: Endoscopy;  Laterality: N/A;  . PROSTATE SURGERY  2008    Prior to Admission medications   Medication Sig Start Date End Date Taking?  Authorizing Provider  allopurinol (ZYLOPRIM) 100 MG tablet Take 200 mg by mouth daily.    Yes [provider]  amLODipine (NORVASC) 5 MG tablet Take 5 mg by mouth daily.    Yes [provider]  lisinopril-hydrochlorothiazide (PRINZIDE,ZESTORETIC) 20-12.5 MG tablet Take 1 tablet by mouth daily.   Yes [provider]  acetaminophen (TYLENOL) 325 MG tablet Take 650 mg by mouth daily. Take 650 mg daily - 2 days prior to [facility] IV infusion    [provider]  acyclovir (ZOVIRAX) 400 MG tablet One pill a day [to prevent shingles] Patient not taking: Reported on 10/05/2017 07/27/17   Cammie Sickle, MD  apixaban (ELIQUIS) 5 MG TABS tablet Take 1 tablet (5 mg total) by mouth 2 (two) times daily. 10/05/17   Lisa Roca, MD  chlorambucil (LEUKERAN) 2 MG tablet Take 25 tablets (50 mg total) by mouth every 14 (fourteen) days. Take on days 1 and 15 of chemotherapy. Repeat every 28 days. Give on an empty stomach 1 hour before or 2 hours after meals. Patient not taking: Reported on 10/05/2017 07/27/17   Cammie Sickle, MD  dexamethasone (DECADRON) 4 MG tablet Start 2 days prior to infusion; Take for 2 days. Do not take on the day of infusion. 08/02/17   Cammie Sickle, MD  loratadine (CLARITIN) 10 MG tablet Take 10 mg by mouth daily. Take 1 tablet  daily starting 2 days prior to [facility] IV infusion    [provider]  montelukast (SINGULAIR) 10 MG tablet Take 1 tablet (10 mg total) by mouth at bedtime. Start 2 days prior to infusion. Take it for 4 days. Patient not taking: Reported on 10/05/2017 08/02/17   Cammie Sickle, MD  ondansetron (ZOFRAN) 8 MG tablet 1 pill every 8 hours as needed for nausea/vomitting Patient not taking: Reported on 10/05/2017 07/27/17   Cammie Sickle, MD  prochlorperazine (COMPAZINE) 10 MG tablet Take 1 tablet (10 mg total) by mouth every 6 (six) hours as needed for nausea or vomiting. Patient not taking: Reported on  10/05/2017 07/27/17   Cammie Sickle, MD  ranitidine (ZANTAC) 150 MG capsule Take 150 mg by mouth 2 (two) times daily. Start taking 2 days prior to [facility] IV infusion    [provider]    Allergies  Allergen Reactions  . Penicillins Other (See Comments)    unknown    Family History  Problem Relation Age of Onset  . Lung cancer Mother 26       deceased  . Brain cancer Son 3       pt's only son  . Polycythemia Brother        half brother  . Prostate cancer Brother     Social History Social History   Tobacco Use  . Smoking status: Never Smoker  . Smokeless tobacco: Never Used  Substance Use Topics  . Alcohol use: No    Alcohol/week: 0.0 oz  . Drug use: No    Review of Systems  Constitutional: Negative for fever. Eyes: Negative for visual changes. ENT: Negative for sore throat. Cardiovascular: Negative for chest pain. Respiratory: Positive short for shortness of breath on Friday, but now improved/gone. Gastrointestinal: Negative for abdominal pain, vomiting and diarrhea. Genitourinary: Negative for dysuria. Musculoskeletal: Negative for back pain. Skin: Negative for rash. Neurological: Negative for headache.  ____________________________________________   PHYSICAL EXAM:  VITAL SIGNS: ED Triage Vitals  Enc Vitals Group     BP 10/05/17 1231 123/63     Pulse Rate 10/05/17 1231 80     Resp 10/05/17 1231 15     Temp 10/05/17 1231 (!) 97.5 F (36.4 C)     Temp Source 10/05/17 1231 Oral     SpO2 10/05/17 1231 98 %     Weight 10/05/17 1227 220 lb (99.8 kg)     Height 10/05/17 1227 5\' 7"  (1.702 m)     Head Circumference --      Peak Flow --      Pain Score 10/05/17 1227 0     Pain Loc --      Pain Edu? --      Excl. in [place]? --      Constitutional: Alert and oriented. Well appearing and in no distress. HEENT   Head: Normocephalic and atraumatic.      Eyes: Conjunctivae are normal. Pupils equal and round.       Ears:         Nose: No  congestion/rhinnorhea.   Mouth/Throat: Mucous membranes are moist.   Neck: No stridor. Cardiovascular/Chest: Normal rate, regular rhythm.  No murmurs, rubs, or gallops. Respiratory: Normal respiratory effort without tachypnea nor retractions. Breath sounds are clear and equal bilaterally. No wheezes/rales/rhonchi. Gastrointestinal: Soft. No distention, no guarding, no rebound. Nontender.    Genitourinary/rectal:Deferred Musculoskeletal: Nontender with normal range of motion in all extremities. No joint effusions.  No lower extremity tenderness.  No edema. Neurologic:  Normal speech and language. No gross or focal neurologic deficits are appreciated. Skin:  Skin is warm, dry and intact. No rash noted. Psychiatric: Mood and affect are normal. Speech and behavior are normal. Patient exhibits appropriate insight and judgment.   ____________________________________________  LABS (pertinent positives/negatives) I, Lisa Roca, MD the attending physician have reviewed the labs noted below.  Labs Reviewed  BASIC METABOLIC PANEL - Abnormal; Notable for the following components:      Result Value   Glucose, Bld 180 (*)    Creatinine, Ser 1.28 (*)    Calcium 8.7 (*)    GFR calc non Af Amer 50 (*)    GFR calc Af Amer 58 (*)    All other components within normal limits  CBC - Abnormal; Notable for the following components:   RDW 16.9 (*)    Platelets 144 (*)    All other components within normal limits  TROPONIN I    ____________________________________________    EKG I, Lisa Roca, MD, the attending physician have personally viewed and interpreted all ECGs.  83 bpm.  Irregularly irregular, undetermined rhythm, possibly A. fib.  Right bundle branch block.  Nonspecific ST and T wave.  Last EKG was from several years ago, nonspecific intraventricular conduction delay without right bundle branch block, and normal sinus  rhythm. ____________________________________________  RADIOLOGY All Xrays were viewed by me.  Imaging interpreted by Radiologist, and I, Lisa Roca, MD the attending physician have reviewed the radiologist interpretation noted below.  Chest x-ray 2 view:  Done earlier at cancer center:   IMPRESSION: Left base atelectasis.  No edema or consolidation.  No adenopathy evident. There is aortic atherosclerosis. Heart size upper normal.  Aortic Atherosclerosis (ICD10-I70.0).  __________________________________________  PROCEDURES  Procedure(s) performed: None  Critical Care performed: None   ____________________________________________  ED COURSE / ASSESSMENT AND PLAN  Pertinent labs & imaging results that were available during my care of the patient were reviewed by me and considered in my medical decision making (see chart for details).    Patient states that shortness of breath that was reportedly difficult on Friday, is improved/gone now.  He states he does not think was wheezing.  He does not appear to be having a volume overload state or history of CHF.  Is not been reporting history of fevers or chills or pneumonia.  His chest x-ray shows an area of atelectasis.  No hemoptysis.  No pleuritic chest pain.  I am not suspicious of PE at this point time.  He was able to walk with no drop in O2 sat.  His EKG does show irregularly irregular, likely consistent with atrial fibrillation.  Question whether or not the episode a couple days ago could have been rapid A. fib.  As discussed with Dr. Nehemiah Massed who will see the patient tomorrow at 11 AM.  We discussed given his age and weight to go ahead with Eliquis 5 mg.  At this point no additional rate control medication.  I discussed this with the family.  He will see Dr. Adolph Pollack tomorrow at 11 AM.  DIFFERENTIAL DIAGNOSIS: Differential includes, but is not limited to, viral syndrome, bronchitis including COPD exacerbation,  pneumonia, reactive airway disease including asthma, CHF including exacerbation with or without pulmonary/interstitial edema, pneumothorax, ACS, thoracic trauma, and pulmonary embolism.  CONSULTATIONS: Dr. Nehemiah Massed, cardiology - ok to see tomorrow 11am in office, start eliquis.   Patient / Family / Caregiver informed of clinical course, medical decision-making process, and agree with  plan.   I discussed return precautions, follow-up instructions, and discharge instructions with patient and/or family.  Discharge Instructions : You are evaluated for shortness of breath a couple of days ago which is now improved, and sent here for evaluation given borderline heart enzyme, which was repeated and normal here in the emergency department today.  As we discussed, your EKG shows an irregular heartbeat called atrial fibrillation.  You are being started on blood thinner called Eliquis.  Dr.Kowalski, cardiologist will see you at 11am tomorrow.  Return to the emergency room immediately for any new or worsening condition including chest pain with breathing, nausea, sweats, dizziness or passing out, nor worsening trouble breathing or shortness of breath, or any other symptoms concerning to you.    ___________________________________________   FINAL CLINICAL IMPRESSION(S) / ED DIAGNOSES   Final diagnoses:  Atrial fibrillation, new onset ([place])  Shortness of breath      ___________________________________________        Note: This dictation was prepared with Dragon dictation. Any transcriptional errors that result from this process are unintentional    Lisa Roca, MD 10/05/17 1512

## 2017-10-05 NOTE — Progress Notes (Signed)
[place] OFFICE PROGRESS NOTE   SUMMARY OF ONCOLOGIC HISTORY:  Oncology History   3 Lambda positive B-cell CLL, RAI stage 0, lymphocytosis only. 10/08/06 CT of chest, abdomen and pelvis - No evidence of splenomegaly. No evidence of lymphadenopathy in the chest, abdomen, or pelvis. 10/04/06 flow cytometry of peripheral blood - A monoclonal population of lambda positive B-cells was detected. Phenotype consistent with diagnosis of B cell chronic lymphocytic leukemia/small lymphocytic lymphoma. CD38 positive. 03/06/08 CT Chest abdomen Pelvis:No findings to suggest recurrent disease. No adenopathy. 06/04/09: CT chest/abdomen/pelvis: No evidence of lymphadenopathy/splenomegaly. 06/04/10: ct chest/abdomen/pelvis: no evidence of active malignancy within the thorax or  abdomen or pelvis.  # CLL- 13q del; July 2017 [facility] ordered.   # Sep 27th CT scan- LN+ splenomegaly ------------------------------------------------- --  # OCT 3rd 2018- Gazyva [No chlorambucil sec to insurance issues]  # Prostate Biopsy [PSA ~5; Dr.Wolfe]; June 2017; Enlarged prostate- s/p ? cryo Dr.Wolfe.   # CKD [creat 1.5]     CLL (chronic lymphocytic leukemia) (HCC)    INTERVAL HISTORY:  A very pleasant 83 year old male patient with above history of CLL Is here currently s/p cycle #3 of Gazyva approximately 5 days ago.  Patient states that he had episode of significant shortness of breath-the day after the infusion that lasted all night.  He denied any chest pain or chest pressure.  Denies any cough.  Currently his symptoms have improved.  Back to baseline. Denies any new lumps or bumps.  Denies any swelling in the legs.  REVIEW OF SYSTEMS:  A complete 10 point review of system is done which is negative except mentioned above/history of present illness.   PAST MEDICAL HISTORY :  Past Medical History:  Diagnosis Date  . Bladder neck obstruction   . Cataracts, bilateral   . Chronic lymphocytic leukemia  (CLL), B-cell (HCC)    Lambda positive B-cell CLL  . Gout   . Hypercholesterolemia   . Hypertension   . Lymphocytosis   . Sleep apnea   . Thrombocytopenia ([place])     PAST SURGICAL HISTORY :   Past Surgical History:  Procedure Laterality Date  . COLONOSCOPY WITH PROPOFOL N/A 09/10/2015   Procedure: COLONOSCOPY WITH PROPOFOL;  Surgeon: Lucilla Lame, MD;  Location: [facility] ENDOSCOPY;  Service: Endoscopy;  Laterality: N/A;  . PROSTATE SURGERY  2008    FAMILY HISTORY :   Family History  Problem Relation Age of Onset  . Lung cancer Mother 27       deceased  . Brain cancer Son 9       pt's only son  . Polycythemia Brother        half brother  . Prostate cancer Brother     SOCIAL HISTORY:   Social History   Tobacco Use  . Smoking status: Never Smoker  . Smokeless tobacco: Never Used  Substance Use Topics  . Alcohol use: No    Alcohol/week: 0.0 oz  . Drug use: No    ALLERGIES:  is allergic to penicillins.  MEDICATIONS:  Current Outpatient Medications  Medication Sig Dispense Refill  . allopurinol (ZYLOPRIM) 100 MG tablet Take 200 mg by mouth daily.     Marland Kitchen amLODipine (NORVASC) 10 MG tablet Take 5 mg by mouth daily. Patient takes a half of the 10 mg tablet to equal 5 mg every day    . dexamethasone (DECADRON) 4 MG tablet Start 2 days prior to infusion; Take for 2 days. Do not take on the day of infusion.  60 tablet 3  . lisinopril-hydrochlorothiazide (PRINZIDE,ZESTORETIC) 20-12.5 MG tablet Take 1 tablet by mouth daily.    Marland Kitchen acetaminophen (TYLENOL) 325 MG tablet Take 650 mg by mouth daily. Take 650 mg daily - 2 days prior to [facility] IV infusion    . acyclovir (ZOVIRAX) 400 MG tablet One pill a day [to prevent shingles] (Patient not taking: Reported on 10/05/2017) 60 tablet 3  . chlorambucil (LEUKERAN) 2 MG tablet Take 25 tablets (50 mg total) by mouth every 14 (fourteen) days. Take on days 1 and 15 of chemotherapy. Repeat every 28 days. Give on an empty stomach 1 hour before or 2  hours after meals. (Patient not taking: Reported on 10/05/2017) 50 tablet 5  . loratadine (CLARITIN) 10 MG tablet Take 10 mg by mouth daily. Take 1 tablet daily starting 2 days prior to [facility] IV infusion    . montelukast (SINGULAIR) 10 MG tablet Take 1 tablet (10 mg total) by mouth at bedtime. Start 2 days prior to infusion. Take it for 4 days. (Patient not taking: Reported on 10/05/2017) 60 tablet 0  . ondansetron (ZOFRAN) 8 MG tablet 1 pill every 8 hours as needed for nausea/vomitting (Patient not taking: Reported on 10/05/2017) 40 tablet 1  . prochlorperazine (COMPAZINE) 10 MG tablet Take 1 tablet (10 mg total) by mouth every 6 (six) hours as needed for nausea or vomiting. (Patient not taking: Reported on 10/05/2017) 40 tablet 1  . ranitidine (ZANTAC) 150 MG capsule Take 150 mg by mouth 2 (two) times daily. Start taking 2 days prior to [facility] IV infusion     No current facility-administered medications for this visit.     PHYSICAL EXAMINATION: ECOG PERFORMANCE STATUS: 0 - Asymptomatic  BP (!) 153/80 (BP Location: Right Arm, Patient Position: Sitting)   Pulse (!) 52   Temp (!) 94.9 F (34.9 C) (Tympanic)   Resp 18   Wt 224 lb 3.3 oz (101.7 kg)   BMI 34.09 kg/m   Filed Weights   10/05/17 0945  Weight: 224 lb 3.3 oz (101.7 kg)    GENERAL: Well-nourished well-developed; Alert, no distress and comfortable.   He is with his wife.  EYES: no pallor or icterus OROPHARYNX: no thrush or ulceration; good dentition  NECK: supple, no masses felt LYMPH:  no palpable lymphadenopathy in the cervical or inguinal regions. 1-2 cm lymph nodes noted in the right axilla. LUNGS: clear to auscultation and  No wheeze or crackles HEART/CVS: regular rate & rhythm and no murmurs; No lower extremity edema ABDOMEN:abdomen soft, non-tender and normal bowel sounds; positive for splenomegaly at level of umbilicus.  Musculoskeletal:no cyanosis of digits and no clubbing  PSYCH: alert & oriented x 3 with fluent  speech NEURO: no focal motor/sensory deficits SKIN:  no rashes or significant lesions  LABORATORY DATA:  I have reviewed the data as listed    Component Value Date/Time   NA 135 10/05/2017 0916   NA 137 09/14/2014 0834   K 3.9 10/05/2017 0916   K 4.0 09/14/2014 0834   CL 102 10/05/2017 0916   CL 101 09/14/2014 0834   CO2 25 10/05/2017 0916   CO2 27 09/14/2014 0834   GLUCOSE 102 (H) 10/05/2017 0916   GLUCOSE 123 (H) 09/14/2014 0834   BUN 19 10/05/2017 0916   BUN 14 09/14/2014 0834   CREATININE 1.23 10/05/2017 0916   CREATININE 1.14 02/05/2015 0949   CALCIUM 8.3 (L) 10/05/2017 0916   CALCIUM 8.8 09/14/2014 0834   PROT 6.2 (L) 10/05/2017 9485  PROT 6.7 09/14/2014 0834   ALBUMIN 4.0 10/05/2017 0916   ALBUMIN 3.6 09/14/2014 0834   AST 38 10/05/2017 0916   AST 21 09/14/2014 0834   ALT 57 10/05/2017 0916   ALT 26 09/14/2014 0834   ALKPHOS 41 10/05/2017 0916   ALKPHOS 49 09/14/2014 0834   BILITOT 1.7 (H) 10/05/2017 0916   BILITOT 1.1 (H) 09/14/2014 0834   GFRNONAA 52 (L) 10/05/2017 0916   GFRNONAA 60 (L) 02/05/2015 0949   GFRAA >60 10/05/2017 0916   GFRAA >60 02/05/2015 0949    No results found for: SPEP, UPEP  Lab Results  Component Value Date   WBC 7.2 10/05/2017   NEUTROABS 4.4 10/05/2017   HGB 15.9 10/05/2017   HCT 47.0 10/05/2017   MCV 91.3 10/05/2017   PLT 131 (L) 10/05/2017      Chemistry      Component Value Date/Time   NA 135 10/05/2017 0916   NA 137 09/14/2014 0834   K 3.9 10/05/2017 0916   K 4.0 09/14/2014 0834   CL 102 10/05/2017 0916   CL 101 09/14/2014 0834   CO2 25 10/05/2017 0916   CO2 27 09/14/2014 0834   BUN 19 10/05/2017 0916   BUN 14 09/14/2014 0834   CREATININE 1.23 10/05/2017 0916   CREATININE 1.14 02/05/2015 0949      Component Value Date/Time   CALCIUM 8.3 (L) 10/05/2017 0916   CALCIUM 8.8 09/14/2014 0834   ALKPHOS 41 10/05/2017 0916   ALKPHOS 49 09/14/2014 0834   AST 38 10/05/2017 0916   AST 21 09/14/2014 0834   ALT 57  10/05/2017 0916   ALT 26 09/14/2014 0834   BILITOT 1.7 (H) 10/05/2017 0916   BILITOT 1.1 (H) 09/14/2014 0834      IMPRESSION: 1. Splenomegaly, splenic volume estimated at 1000 cc. 2. Abnormal number of scattered lymph nodes in the abdomen and pelvis ; some of these are enlarged as detailed above. These may be a manifestation of the patient's CLL. 3. Stranding along some omental adipose tissue in the left lower quadrant may be from a remote omental infarct or less likely appendagitis epiploica. Correlate with any point tenderness in this vicinity. 4. Other imaging findings of potential clinical significance: Coronary atherosclerosis. Aortic valve calcification. Thinning of the left ventricle, query prior myocardial infarct. Several renal cysts including a Bosniak category 2 cyst of the right kidney upper pole. Hypodense lesions in segment 4 of the liver and in both kidneys are technically too small to characterize although statistically likely to be benign. Sigmoid diverticulosis. Aortic Atherosclerosis (ICD10-I70.0). Prostatomegaly. Lumbar spondylosis and degenerative disc disease causing lower lumbar impingement.  Electronically Signed: By: Van Clines M.D. On: 07/29/2017 14:13   ASSESSMENT & PLAN:   CLL (chronic lymphocytic leukemia) ([place]) # CLL Rai 13q del. White count is 108 [predominant lymphocytosis]; hemoglobin 15 platelets 146 stable. Currently on Gazyva s/p cycle #3 ; tolerated well except for Dyspnea post infuson[as discussed below]; significant improvement of the blood counts so far.  # Proceed with Gazyva  cycle #4 day-as planned/in approximately 6 weeks.  #Transient sob/ no chest pain-for many hours- check cxr; bnp; ck; troponin. Okay to take baby asprin. ? 2 d echo.   # Infusion reaction- G-1-2; improved with medication. Continue pre-medications; except steroids.   # CKD stage III- 1.3/stable. Discussed increasing fluid intake.   # continue infectious  prophylaxis; recommend acyclovir prophylaxis; Bactrim prophylaxis.   # Follow up in 6 weeks [Jan 12th]/labs/MD/infusion; keep appt as planned call pt-  [phone] [H]  Addendum: Question non-STEMI; patient's troponin elevated at 0.03; called the patient and discussed with the patient the importance of EKG/possible 2D echo and further workup.  Recommend go to the emergency room.  He agrees.       Cammie Sickle, MD 10/05/2017 12:51 PM

## 2017-10-05 NOTE — Discharge Instructions (Addendum)
You are evaluated for shortness of breath a couple of days ago which is now improved, and sent here for evaluation given borderline heart enzyme, which was repeated and normal here in the emergency department today.  As we discussed, your EKG shows an irregular heartbeat called atrial fibrillation.  You are being started on blood thinner called Eliquis.  Dr.Kowalski, cardiologist will see you at 11am tomorrow.  Return to the emergency room immediately for any new or worsening condition including chest pain with breathing, nausea, sweats, dizziness or passing out, nor worsening trouble breathing or shortness of breath, or any other symptoms concerning to you.

## 2017-10-06 DIAGNOSIS — F458 Other somatoform disorders: Secondary | ICD-10-CM | POA: Diagnosis not present

## 2017-10-06 DIAGNOSIS — I4891 Unspecified atrial fibrillation: Secondary | ICD-10-CM | POA: Diagnosis not present

## 2017-10-06 DIAGNOSIS — I10 Essential (primary) hypertension: Secondary | ICD-10-CM | POA: Diagnosis not present

## 2017-10-13 DIAGNOSIS — I4891 Unspecified atrial fibrillation: Secondary | ICD-10-CM | POA: Diagnosis not present

## 2017-10-13 DIAGNOSIS — F458 Other somatoform disorders: Secondary | ICD-10-CM | POA: Diagnosis not present

## 2017-10-19 ENCOUNTER — Ambulatory Visit: Payer: Medicare Other | Admitting: Internal Medicine

## 2017-10-19 ENCOUNTER — Other Ambulatory Visit: Payer: Medicare Other

## 2017-10-19 DIAGNOSIS — R001 Bradycardia, unspecified: Secondary | ICD-10-CM | POA: Insufficient documentation

## 2017-10-19 DIAGNOSIS — K219 Gastro-esophageal reflux disease without esophagitis: Secondary | ICD-10-CM | POA: Diagnosis not present

## 2017-10-19 DIAGNOSIS — I452 Bifascicular block: Secondary | ICD-10-CM | POA: Diagnosis not present

## 2017-10-19 DIAGNOSIS — R0600 Dyspnea, unspecified: Secondary | ICD-10-CM | POA: Diagnosis not present

## 2017-10-19 DIAGNOSIS — I4510 Unspecified right bundle-branch block: Secondary | ICD-10-CM | POA: Diagnosis not present

## 2017-10-19 DIAGNOSIS — I4891 Unspecified atrial fibrillation: Secondary | ICD-10-CM | POA: Diagnosis not present

## 2017-10-19 DIAGNOSIS — J45909 Unspecified asthma, uncomplicated: Secondary | ICD-10-CM | POA: Diagnosis not present

## 2017-10-19 DIAGNOSIS — R9431 Abnormal electrocardiogram [ECG] [EKG]: Secondary | ICD-10-CM | POA: Diagnosis not present

## 2017-10-19 DIAGNOSIS — I495 Sick sinus syndrome: Secondary | ICD-10-CM | POA: Diagnosis not present

## 2017-10-19 DIAGNOSIS — I77810 Thoracic aortic ectasia: Secondary | ICD-10-CM | POA: Diagnosis not present

## 2017-10-19 DIAGNOSIS — C9110 Chronic lymphocytic leukemia of B-cell type not having achieved remission: Secondary | ICD-10-CM | POA: Diagnosis not present

## 2017-10-19 DIAGNOSIS — R0602 Shortness of breath: Secondary | ICD-10-CM | POA: Diagnosis not present

## 2017-10-19 DIAGNOSIS — R42 Dizziness and giddiness: Secondary | ICD-10-CM | POA: Diagnosis not present

## 2017-10-19 DIAGNOSIS — N289 Disorder of kidney and ureter, unspecified: Secondary | ICD-10-CM | POA: Diagnosis not present

## 2017-10-19 DIAGNOSIS — I10 Essential (primary) hypertension: Secondary | ICD-10-CM | POA: Diagnosis not present

## 2017-10-19 DIAGNOSIS — I482 Chronic atrial fibrillation: Secondary | ICD-10-CM | POA: Diagnosis not present

## 2017-10-20 DIAGNOSIS — I10 Essential (primary) hypertension: Secondary | ICD-10-CM | POA: Diagnosis not present

## 2017-10-20 DIAGNOSIS — I4891 Unspecified atrial fibrillation: Secondary | ICD-10-CM | POA: Diagnosis not present

## 2017-10-20 DIAGNOSIS — Z0389 Encounter for observation for other suspected diseases and conditions ruled out: Secondary | ICD-10-CM | POA: Diagnosis not present

## 2017-10-20 DIAGNOSIS — R0600 Dyspnea, unspecified: Secondary | ICD-10-CM | POA: Diagnosis not present

## 2017-10-21 DIAGNOSIS — I482 Chronic atrial fibrillation: Secondary | ICD-10-CM | POA: Diagnosis not present

## 2017-10-21 DIAGNOSIS — C9110 Chronic lymphocytic leukemia of B-cell type not having achieved remission: Secondary | ICD-10-CM | POA: Diagnosis present

## 2017-10-21 DIAGNOSIS — I495 Sick sinus syndrome: Secondary | ICD-10-CM | POA: Diagnosis present

## 2017-10-21 DIAGNOSIS — J45909 Unspecified asthma, uncomplicated: Secondary | ICD-10-CM | POA: Diagnosis present

## 2017-10-21 DIAGNOSIS — I481 Persistent atrial fibrillation: Secondary | ICD-10-CM | POA: Diagnosis not present

## 2017-10-21 DIAGNOSIS — Z9221 Personal history of antineoplastic chemotherapy: Secondary | ICD-10-CM | POA: Diagnosis not present

## 2017-10-21 DIAGNOSIS — M109 Gout, unspecified: Secondary | ICD-10-CM | POA: Diagnosis present

## 2017-10-21 DIAGNOSIS — I10 Essential (primary) hypertension: Secondary | ICD-10-CM | POA: Diagnosis not present

## 2017-10-21 DIAGNOSIS — R001 Bradycardia, unspecified: Secondary | ICD-10-CM | POA: Diagnosis not present

## 2017-10-21 DIAGNOSIS — Z880 Allergy status to penicillin: Secondary | ICD-10-CM | POA: Diagnosis not present

## 2017-10-21 DIAGNOSIS — Z950 Presence of cardiac pacemaker: Secondary | ICD-10-CM | POA: Diagnosis not present

## 2017-10-21 DIAGNOSIS — G4733 Obstructive sleep apnea (adult) (pediatric): Secondary | ICD-10-CM | POA: Diagnosis present

## 2017-10-21 DIAGNOSIS — K219 Gastro-esophageal reflux disease without esophagitis: Secondary | ICD-10-CM | POA: Diagnosis present

## 2017-10-21 DIAGNOSIS — R0602 Shortness of breath: Secondary | ICD-10-CM | POA: Diagnosis not present

## 2017-10-21 DIAGNOSIS — I4891 Unspecified atrial fibrillation: Secondary | ICD-10-CM | POA: Diagnosis not present

## 2017-10-21 DIAGNOSIS — Z7901 Long term (current) use of anticoagulants: Secondary | ICD-10-CM | POA: Diagnosis not present

## 2017-10-28 DIAGNOSIS — I481 Persistent atrial fibrillation: Secondary | ICD-10-CM | POA: Diagnosis not present

## 2017-10-28 DIAGNOSIS — Z45018 Encounter for adjustment and management of other part of cardiac pacemaker: Secondary | ICD-10-CM | POA: Diagnosis not present

## 2017-10-28 DIAGNOSIS — R001 Bradycardia, unspecified: Secondary | ICD-10-CM | POA: Diagnosis not present

## 2017-11-09 ENCOUNTER — Other Ambulatory Visit: Payer: Self-pay | Admitting: Internal Medicine

## 2017-11-09 DIAGNOSIS — C9110 Chronic lymphocytic leukemia of B-cell type not having achieved remission: Secondary | ICD-10-CM

## 2017-11-10 ENCOUNTER — Inpatient Hospital Stay: Payer: Medicare Other | Attending: Internal Medicine | Admitting: Internal Medicine

## 2017-11-10 ENCOUNTER — Encounter: Payer: Self-pay | Admitting: Internal Medicine

## 2017-11-10 ENCOUNTER — Inpatient Hospital Stay: Payer: Medicare Other

## 2017-11-10 VITALS — BP 143/71 | HR 67 | Temp 97.8°F | Resp 16 | Wt 224.2 lb

## 2017-11-10 DIAGNOSIS — C9110 Chronic lymphocytic leukemia of B-cell type not having achieved remission: Secondary | ICD-10-CM | POA: Insufficient documentation

## 2017-11-10 DIAGNOSIS — Z950 Presence of cardiac pacemaker: Secondary | ICD-10-CM | POA: Diagnosis not present

## 2017-11-10 DIAGNOSIS — Z5111 Encounter for antineoplastic chemotherapy: Secondary | ICD-10-CM | POA: Insufficient documentation

## 2017-11-10 DIAGNOSIS — Z801 Family history of malignant neoplasm of trachea, bronchus and lung: Secondary | ICD-10-CM | POA: Insufficient documentation

## 2017-11-10 DIAGNOSIS — N183 Chronic kidney disease, stage 3 (moderate): Secondary | ICD-10-CM | POA: Diagnosis not present

## 2017-11-10 DIAGNOSIS — R161 Splenomegaly, not elsewhere classified: Secondary | ICD-10-CM | POA: Insufficient documentation

## 2017-11-10 DIAGNOSIS — Z880 Allergy status to penicillin: Secondary | ICD-10-CM | POA: Diagnosis not present

## 2017-11-10 DIAGNOSIS — I252 Old myocardial infarction: Secondary | ICD-10-CM

## 2017-11-10 DIAGNOSIS — I700 Atherosclerosis of aorta: Secondary | ICD-10-CM | POA: Insufficient documentation

## 2017-11-10 DIAGNOSIS — Z8042 Family history of malignant neoplasm of prostate: Secondary | ICD-10-CM | POA: Diagnosis not present

## 2017-11-10 DIAGNOSIS — N281 Cyst of kidney, acquired: Secondary | ICD-10-CM | POA: Diagnosis not present

## 2017-11-10 DIAGNOSIS — Z7901 Long term (current) use of anticoagulants: Secondary | ICD-10-CM | POA: Insufficient documentation

## 2017-11-10 DIAGNOSIS — Z79899 Other long term (current) drug therapy: Secondary | ICD-10-CM | POA: Insufficient documentation

## 2017-11-10 DIAGNOSIS — I129 Hypertensive chronic kidney disease with stage 1 through stage 4 chronic kidney disease, or unspecified chronic kidney disease: Secondary | ICD-10-CM | POA: Diagnosis not present

## 2017-11-10 DIAGNOSIS — E7800 Pure hypercholesterolemia, unspecified: Secondary | ICD-10-CM | POA: Diagnosis not present

## 2017-11-10 DIAGNOSIS — Z808 Family history of malignant neoplasm of other organs or systems: Secondary | ICD-10-CM | POA: Diagnosis not present

## 2017-11-10 LAB — CBC WITH DIFFERENTIAL/PLATELET
BASOS ABS: 0 10*3/uL (ref 0–0.1)
Basophils Relative: 0 %
EOS PCT: 0 %
Eosinophils Absolute: 0 10*3/uL (ref 0–0.7)
HEMATOCRIT: 45.6 % (ref 40.0–52.0)
Hemoglobin: 15.5 g/dL (ref 13.0–18.0)
Lymphocytes Relative: 21 %
Lymphs Abs: 1.6 10*3/uL (ref 1.0–3.6)
MCH: 31.4 pg (ref 26.0–34.0)
MCHC: 34.1 g/dL (ref 32.0–36.0)
MCV: 92 fL (ref 80.0–100.0)
MONO ABS: 0.3 10*3/uL (ref 0.2–1.0)
MONOS PCT: 4 %
NEUTROS ABS: 5.9 10*3/uL (ref 1.4–6.5)
Neutrophils Relative %: 75 %
PLATELETS: 124 10*3/uL — AB (ref 150–440)
RBC: 4.95 MIL/uL (ref 4.40–5.90)
RDW: 15.4 % — AB (ref 11.5–14.5)
WBC: 7.9 10*3/uL (ref 3.8–10.6)

## 2017-11-10 LAB — COMPREHENSIVE METABOLIC PANEL
ALBUMIN: 3.9 g/dL (ref 3.5–5.0)
ALK PHOS: 29 U/L — AB (ref 38–126)
ALT: 17 U/L (ref 17–63)
AST: 27 U/L (ref 15–41)
Anion gap: 10 (ref 5–15)
BILIRUBIN TOTAL: 1.3 mg/dL — AB (ref 0.3–1.2)
BUN: 19 mg/dL (ref 6–20)
CALCIUM: 8.8 mg/dL — AB (ref 8.9–10.3)
CO2: 23 mmol/L (ref 22–32)
Chloride: 102 mmol/L (ref 101–111)
Creatinine, Ser: 1.13 mg/dL (ref 0.61–1.24)
GFR calc Af Amer: >60 mL/min (ref >60)
GFR calc non Af Amer: 57 mL/min — ABNORMAL LOW (ref >60)
GLUCOSE: 145 mg/dL — AB (ref 65–99)
Potassium: 4 mmol/L (ref 3.5–5.1)
Sodium: 135 mmol/L (ref 135–145)
TOTAL PROTEIN: 6.1 g/dL — AB (ref 6.5–8.1)

## 2017-11-10 MED ORDER — SODIUM CHLORIDE 0.9 % IV SOLN
1000.0000 mg | Freq: Once | INTRAVENOUS | Status: AC
  Administered 2017-11-10: 1000 mg via INTRAVENOUS
  Filled 2017-11-10: qty 40

## 2017-11-10 MED ORDER — ACETAMINOPHEN 325 MG PO TABS
650.0000 mg | ORAL_TABLET | Freq: Once | ORAL | Status: AC
  Administered 2017-11-10: 650 mg via ORAL
  Filled 2017-11-10: qty 2

## 2017-11-10 MED ORDER — SODIUM CHLORIDE 0.9 % IV SOLN
Freq: Once | INTRAVENOUS | Status: AC
  Administered 2017-11-10: 10:00:00 via INTRAVENOUS
  Filled 2017-11-10: qty 1000

## 2017-11-10 MED ORDER — DIPHENHYDRAMINE HCL 50 MG/ML IJ SOLN
50.0000 mg | Freq: Once | INTRAMUSCULAR | Status: AC
  Administered 2017-11-10: 50 mg via INTRAVENOUS
  Filled 2017-11-10: qty 1

## 2017-11-10 MED ORDER — SODIUM CHLORIDE 0.9 % IV SOLN
20.0000 mg | Freq: Once | INTRAVENOUS | Status: AC
  Administered 2017-11-10: 20 mg via INTRAVENOUS
  Filled 2017-11-10: qty 2

## 2017-11-10 NOTE — Assessment & Plan Note (Addendum)
#   CLL Rai 13q del. White count is 108 [predominant lymphocytosis]; hemoglobin 15 platelets 146 stable.   # Currently on Gazyva s/p cycle #3 ; tolerated well except for  significant improvement of the blood counts so far.  # Proceed with Gazyva  cycle #4 day-as planned. Labs today reviewed;  acceptable for treatment today.   # Afib/bradycardia s/p [facility] at [facility]; ? Need for eliquis; pt will let us know; will check with oral pharmacy.   # CKD stage III- 1.3/stable. Discussed increasing fluid intake.   # continue infectious prophylaxis; recommend acyclovir prophylaxis; Bactrim prophylaxis.   # follow up in 4 weeks/labs/infusion again.   Addendum: As per patient is recommended Eliquis per cardiology;  given the financial constraints will prescribe Eliquis.

## 2017-11-10 NOTE — Progress Notes (Signed)
[place] OFFICE PROGRESS NOTE   SUMMARY OF ONCOLOGIC HISTORY:  Oncology History   3 Lambda positive B-cell CLL, RAI stage 0, lymphocytosis only. 10/08/06 CT of chest, abdomen and pelvis - No evidence of splenomegaly. No evidence of lymphadenopathy in the chest, abdomen, or pelvis. 10/04/06 flow cytometry of peripheral blood - A monoclonal population of lambda positive B-cells was detected. Phenotype consistent with diagnosis of B cell chronic lymphocytic leukemia/small lymphocytic lymphoma. CD38 positive. 03/06/08 CT Chest abdomen Pelvis:No findings to suggest recurrent disease. No adenopathy. 06/04/09: CT chest/abdomen/pelvis: No evidence of lymphadenopathy/splenomegaly. 06/04/10: ct chest/abdomen/pelvis: no evidence of active malignancy within the thorax or  abdomen or pelvis.  # CLL- 13q del; July 2017 [facility] ordered.   # Sep 27th CT scan- LN+ splenomegaly ------------------------------------------------- --  # OCT 3rd 2018- Gazyva [No chlorambucil sec to insurance issues]  # Prostate Biopsy [PSA ~5; Dr.Wolfe]; June 2017; Enlarged prostate- s/p ? cryo Dr.Wolfe.   # CKD [creat 1.5]     CLL (chronic lymphocytic leukemia) (HCC)    INTERVAL HISTORY:  A very pleasant 84 year old male patient with above history of CLL Is here currently s/p cycle #3 of Gazyva approximately 6 weeks ago.  In the interim patient was evaluated at [facility] for bradycardia/A. fib had a pacemaker placed.  He states that he was supposed to be on Eliquis as per cardiology-; however unable to be on on it because of cost issues.  He denied any chest pain or chest pressure.  Denies any cough. Currently his symptoms have improved.  Back to baseline. Denies any new lumps or bumps.  Denies any swelling in the legs.  REVIEW OF SYSTEMS:  A complete 10 point review of system is done which is negative except mentioned above/history of present illness.   PAST MEDICAL HISTORY :  Past Medical History:   Diagnosis Date  . Bladder neck obstruction   . Cataracts, bilateral   . Chronic lymphocytic leukemia (CLL), B-cell (HCC)    Lambda positive B-cell CLL  . Gout   . Hypercholesterolemia   . Hypertension   . Lymphocytosis   . Sleep apnea   . Thrombocytopenia ([place])     PAST SURGICAL HISTORY :   Past Surgical History:  Procedure Laterality Date  . COLONOSCOPY WITH PROPOFOL N/A 09/10/2015   Procedure: COLONOSCOPY WITH PROPOFOL;  Surgeon: Lucilla Lame, MD;  Location: [facility] ENDOSCOPY;  Service: Endoscopy;  Laterality: N/A;  . PROSTATE SURGERY  2008    FAMILY HISTORY :   Family History  Problem Relation Age of Onset  . Lung cancer Mother 49       deceased  . Brain cancer Son 71       pt's only son  . Polycythemia Brother        half brother  . Prostate cancer Brother     SOCIAL HISTORY:   Social History   Tobacco Use  . Smoking status: Never Smoker  . Smokeless tobacco: Never Used  Substance Use Topics  . Alcohol use: No    Alcohol/week: 0.0 oz  . Drug use: No    ALLERGIES:  is allergic to penicillins.  MEDICATIONS:  Current Outpatient Medications  Medication Sig Dispense Refill  . acetaminophen (TYLENOL) 325 MG tablet Take 650 mg by mouth daily. Take 650 mg daily - 2 days prior to [facility] IV infusion    . acyclovir (ZOVIRAX) 400 MG tablet One pill a day [to prevent shingles] 60 tablet 3  . allopurinol (ZYLOPRIM) 100  MG tablet Take 200 mg by mouth daily.     Marland Kitchen amLODipine (NORVASC) 5 MG tablet Take 5 mg by mouth daily.     Marland Kitchen apixaban (ELIQUIS) 5 MG TABS tablet Take 1 tablet (5 mg total) by mouth 2 (two) times daily. 60 tablet 0  . atorvastatin (LIPITOR) 20 MG tablet Take 20 mg by mouth daily. Take 1/2 tablet daily    . chlorambucil (LEUKERAN) 2 MG tablet Take 25 tablets (50 mg total) by mouth every 14 (fourteen) days. Take on days 1 and 15 of chemotherapy. Repeat every 28 days. Give on an empty stomach 1 hour before or 2 hours after meals. 50 tablet 5  . dexamethasone  (DECADRON) 4 MG tablet Start 2 days prior to infusion; Take for 2 days. Do not take on the day of infusion. 60 tablet 3  . lisinopril-hydrochlorothiazide (PRINZIDE,ZESTORETIC) 20-12.5 MG tablet Take 1 tablet by mouth daily.    Marland Kitchen loratadine (CLARITIN) 10 MG tablet Take 10 mg by mouth daily. Take 1 tablet daily starting 2 days prior to [facility] IV infusion    . montelukast (SINGULAIR) 10 MG tablet Take 1 tablet (10 mg total) by mouth at bedtime. Start 2 days prior to infusion. Take it for 4 days. 60 tablet 0  . ondansetron (ZOFRAN) 8 MG tablet 1 pill every 8 hours as needed for nausea/vomitting 40 tablet 1  . prochlorperazine (COMPAZINE) 10 MG tablet Take 1 tablet (10 mg total) by mouth every 6 (six) hours as needed for nausea or vomiting. 40 tablet 1  . ranitidine (ZANTAC) 150 MG capsule Take 150 mg by mouth 2 (two) times daily. Start taking 2 days prior to [facility] IV infusion     No current facility-administered medications for this visit.     PHYSICAL EXAMINATION: ECOG PERFORMANCE STATUS: 0 - Asymptomatic  BP (!) 143/71 (BP Location: Left Arm, Patient Position: Sitting)   Pulse 67   Temp 97.8 F (36.6 C)   Resp 16   Wt 224 lb 3.2 oz (101.7 kg)   BMI 35.11 kg/m   Filed Weights   11/10/17 0833  Weight: 224 lb 3.2 oz (101.7 kg)    GENERAL: Well-nourished well-developed; Alert, no distress and comfortable.   He is with his wife.  EYES: no pallor or icterus OROPHARYNX: no thrush or ulceration; good dentition  NECK: supple, no masses felt LYMPH:  no palpable lymphadenopathy in the cervical or inguinal regions. 1-2 cm lymph nodes noted in the right axilla. LUNGS: clear to auscultation and  No wheeze or crackles HEART/CVS: regular rate & rhythm and no murmurs; No lower extremity edema ABDOMEN:abdomen soft, non-tender and normal bowel sounds; positive for splenomegaly at level of umbilicus.  Musculoskeletal:no cyanosis of digits and no clubbing  PSYCH: alert & oriented x 3 with fluent  speech NEURO: no focal motor/sensory deficits SKIN:  no rashes or significant lesions  LABORATORY DATA:  I have reviewed the data as listed    Component Value Date/Time   NA 135 11/10/2017 0756   NA 137 09/14/2014 0834   K 4.0 11/10/2017 0756   K 4.0 09/14/2014 0834   CL 102 11/10/2017 0756   CL 101 09/14/2014 0834   CO2 23 11/10/2017 0756   CO2 27 09/14/2014 0834   GLUCOSE 145 (H) 11/10/2017 0756   GLUCOSE 123 (H) 09/14/2014 0834   BUN 19 11/10/2017 0756   BUN 14 09/14/2014 0834   CREATININE 1.13 11/10/2017 0756   CREATININE 1.14 02/05/2015 0949   CALCIUM 8.8 (  L) 11/10/2017 0756   CALCIUM 8.8 09/14/2014 0834   PROT 6.1 (L) 11/10/2017 0756   PROT 6.7 09/14/2014 0834   ALBUMIN 3.9 11/10/2017 0756   ALBUMIN 3.6 09/14/2014 0834   AST 27 11/10/2017 0756   AST 21 09/14/2014 0834   ALT 17 11/10/2017 0756   ALT 26 09/14/2014 0834   ALKPHOS 29 (L) 11/10/2017 0756   ALKPHOS 49 09/14/2014 0834   BILITOT 1.3 (H) 11/10/2017 0756   BILITOT 1.1 (H) 09/14/2014 0834   GFRNONAA 57 (L) 11/10/2017 0756   GFRNONAA 60 (L) 02/05/2015 0949   GFRAA >60 11/10/2017 0756   GFRAA >60 02/05/2015 0949    No results found for: SPEP, UPEP  Lab Results  Component Value Date   WBC 7.9 11/10/2017   NEUTROABS 5.9 11/10/2017   HGB 15.5 11/10/2017   HCT 45.6 11/10/2017   MCV 92.0 11/10/2017   PLT 124 (L) 11/10/2017      Chemistry      Component Value Date/Time   NA 135 11/10/2017 0756   NA 137 09/14/2014 0834   K 4.0 11/10/2017 0756   K 4.0 09/14/2014 0834   CL 102 11/10/2017 0756   CL 101 09/14/2014 0834   CO2 23 11/10/2017 0756   CO2 27 09/14/2014 0834   BUN 19 11/10/2017 0756   BUN 14 09/14/2014 0834   CREATININE 1.13 11/10/2017 0756   CREATININE 1.14 02/05/2015 0949      Component Value Date/Time   CALCIUM 8.8 (L) 11/10/2017 0756   CALCIUM 8.8 09/14/2014 0834   ALKPHOS 29 (L) 11/10/2017 0756   ALKPHOS 49 09/14/2014 0834   AST 27 11/10/2017 0756   AST 21 09/14/2014 0834    ALT 17 11/10/2017 0756   ALT 26 09/14/2014 0834   BILITOT 1.3 (H) 11/10/2017 0756   BILITOT 1.1 (H) 09/14/2014 0834      IMPRESSION: 1. Splenomegaly, splenic volume estimated at 1000 cc. 2. Abnormal number of scattered lymph nodes in the abdomen and pelvis ; some of these are enlarged as detailed above. These may be a manifestation of the patient's CLL. 3. Stranding along some omental adipose tissue in the left lower quadrant may be from a remote omental infarct or less likely appendagitis epiploica. Correlate with any point tenderness in this vicinity. 4. Other imaging findings of potential clinical significance: Coronary atherosclerosis. Aortic valve calcification. Thinning of the left ventricle, query prior myocardial infarct. Several renal cysts including a Bosniak category 2 cyst of the right kidney upper pole. Hypodense lesions in segment 4 of the liver and in both kidneys are technically too small to characterize although statistically likely to be benign. Sigmoid diverticulosis. Aortic Atherosclerosis (ICD10-I70.0). Prostatomegaly. Lumbar spondylosis and degenerative disc disease causing lower lumbar impingement.  Electronically Signed: By: Van Clines M.D. On: 07/29/2017 14:13   ASSESSMENT & PLAN:   CLL (chronic lymphocytic leukemia) ([place]) # CLL Rai 13q del. White count is 108 [predominant lymphocytosis]; hemoglobin 15 platelets 146 stable.   # Currently on Gazyva s/p cycle #3 ; tolerated well except for  significant improvement of the blood counts so far.  # Proceed with Gazyva  cycle #4 day-as planned. Labs today reviewed;  acceptable for treatment today.   # Afib/bradycardia s/p PPM at [facility]; ? Need for eliquis; pt will let us know; will check with oral pharmacy.   # CKD stage III- 1.3/stable. Discussed increasing fluid intake.   # continue infectious prophylaxis; recommend acyclovir prophylaxis; Bactrim prophylaxis.   # follow up in  4  weeks/labs/infusion again.      Cammie Sickle, MD 11/10/2017 9:25 AM

## 2017-11-15 ENCOUNTER — Telehealth: Payer: Self-pay | Admitting: Internal Medicine

## 2017-11-15 ENCOUNTER — Telehealth: Payer: Self-pay | Admitting: *Deleted

## 2017-11-15 MED ORDER — APIXABAN 5 MG PO TABS: 5.0000 mg | ORAL_TABLET | Freq: Two times a day (BID) | ORAL | 5 refills | Status: DC

## 2017-11-15 NOTE — Telephone Encounter (Signed)
Oral Oncology Patient Advocate Encounter  Sent application to [place] to have patient sign for [place] in an effort to reduce patient's out of pocket expense for Eliquis to $0.    Application completed and faxed to [phone] on 11/16/2017.   Patient assistance phone number for follow up is [phone].   This encounter will be updated until final determination.   [place] Patient Advocate [phone] 11/15/2017 4:40 PM

## 2017-11-15 NOTE — Addendum Note (Signed)
Addended by: Charlaine Dalton R on: 11/15/2017 01:29 PM   Modules accepted: Orders

## 2017-11-15 NOTE — Telephone Encounter (Signed)
Patient called stating he has an answer for Dr Rogue Bussing regarding his blood thinner and the answer is definitely yes

## 2017-11-15 NOTE — Telephone Encounter (Signed)
I contacted patient to obtain more information on what he was referring to regarding his blood thinner medication. Patient states that Dr. Rogue Bussing needed to know if cardiology wanted him to continue taking Eliquis, and the patient states that he contacted his cardiologist, and they state he needs to stay on Eliquis permanently.

## 2017-11-16 DIAGNOSIS — R972 Elevated prostate specific antigen [PSA]: Secondary | ICD-10-CM | POA: Diagnosis not present

## 2017-11-16 DIAGNOSIS — N401 Enlarged prostate with lower urinary tract symptoms: Secondary | ICD-10-CM | POA: Diagnosis not present

## 2017-11-22 ENCOUNTER — Telehealth: Payer: Self-pay | Admitting: Internal Medicine

## 2017-11-22 NOTE — Telephone Encounter (Signed)
.   Oral Oncology Patient Advocate Encounter  Received notification from [place] that patient has been denied enrollment into their program to receive Eliquis from the drug manufacturer due to income.    Patient knows to call the office with questions or concerns.  Oral Oncology Clinic will continue to follow.  [place] Patient Advocate [phone] 11/22/2017 10:48 AM

## 2017-12-08 ENCOUNTER — Inpatient Hospital Stay (HOSPITAL_BASED_OUTPATIENT_CLINIC_OR_DEPARTMENT_OTHER): Payer: Medicare Other | Admitting: Internal Medicine

## 2017-12-08 ENCOUNTER — Inpatient Hospital Stay: Payer: Medicare Other | Attending: Internal Medicine

## 2017-12-08 ENCOUNTER — Inpatient Hospital Stay: Payer: Medicare Other

## 2017-12-08 ENCOUNTER — Other Ambulatory Visit: Payer: Self-pay

## 2017-12-08 VITALS — BP 133/81 | HR 73 | Temp 97.9°F | Resp 20 | Ht 67.0 in | Wt 227.8 lb

## 2017-12-08 DIAGNOSIS — Z79899 Other long term (current) drug therapy: Secondary | ICD-10-CM

## 2017-12-08 DIAGNOSIS — Z808 Family history of malignant neoplasm of other organs or systems: Secondary | ICD-10-CM | POA: Insufficient documentation

## 2017-12-08 DIAGNOSIS — E7800 Pure hypercholesterolemia, unspecified: Secondary | ICD-10-CM

## 2017-12-08 DIAGNOSIS — N400 Enlarged prostate without lower urinary tract symptoms: Secondary | ICD-10-CM | POA: Diagnosis not present

## 2017-12-08 DIAGNOSIS — I252 Old myocardial infarction: Secondary | ICD-10-CM

## 2017-12-08 DIAGNOSIS — I2510 Atherosclerotic heart disease of native coronary artery without angina pectoris: Secondary | ICD-10-CM | POA: Insufficient documentation

## 2017-12-08 DIAGNOSIS — I129 Hypertensive chronic kidney disease with stage 1 through stage 4 chronic kidney disease, or unspecified chronic kidney disease: Secondary | ICD-10-CM | POA: Diagnosis not present

## 2017-12-08 DIAGNOSIS — I4891 Unspecified atrial fibrillation: Secondary | ICD-10-CM

## 2017-12-08 DIAGNOSIS — Z7982 Long term (current) use of aspirin: Secondary | ICD-10-CM

## 2017-12-08 DIAGNOSIS — R161 Splenomegaly, not elsewhere classified: Secondary | ICD-10-CM | POA: Insufficient documentation

## 2017-12-08 DIAGNOSIS — N183 Chronic kidney disease, stage 3 (moderate): Secondary | ICD-10-CM

## 2017-12-08 DIAGNOSIS — I700 Atherosclerosis of aorta: Secondary | ICD-10-CM | POA: Insufficient documentation

## 2017-12-08 DIAGNOSIS — C9110 Chronic lymphocytic leukemia of B-cell type not having achieved remission: Secondary | ICD-10-CM

## 2017-12-08 DIAGNOSIS — R972 Elevated prostate specific antigen [PSA]: Secondary | ICD-10-CM

## 2017-12-08 DIAGNOSIS — K5730 Diverticulosis of large intestine without perforation or abscess without bleeding: Secondary | ICD-10-CM

## 2017-12-08 DIAGNOSIS — Z801 Family history of malignant neoplasm of trachea, bronchus and lung: Secondary | ICD-10-CM | POA: Insufficient documentation

## 2017-12-08 DIAGNOSIS — Z880 Allergy status to penicillin: Secondary | ICD-10-CM

## 2017-12-08 DIAGNOSIS — Z5111 Encounter for antineoplastic chemotherapy: Secondary | ICD-10-CM | POA: Diagnosis not present

## 2017-12-08 DIAGNOSIS — N281 Cyst of kidney, acquired: Secondary | ICD-10-CM | POA: Diagnosis not present

## 2017-12-08 DIAGNOSIS — G4730 Sleep apnea, unspecified: Secondary | ICD-10-CM | POA: Insufficient documentation

## 2017-12-08 DIAGNOSIS — Z8042 Family history of malignant neoplasm of prostate: Secondary | ICD-10-CM | POA: Insufficient documentation

## 2017-12-08 LAB — COMPREHENSIVE METABOLIC PANEL
ALK PHOS: 30 U/L — AB (ref 38–126)
ALT: 15 U/L — AB (ref 17–63)
AST: 24 U/L (ref 15–41)
Albumin: 4.1 g/dL (ref 3.5–5.0)
Anion gap: 9 (ref 5–15)
BILIRUBIN TOTAL: 0.9 mg/dL (ref 0.3–1.2)
BUN: 20 mg/dL (ref 6–20)
CHLORIDE: 105 mmol/L (ref 101–111)
CO2: 24 mmol/L (ref 22–32)
CREATININE: 1.1 mg/dL (ref 0.61–1.24)
Calcium: 9 mg/dL (ref 8.9–10.3)
GFR calc Af Amer: >60 mL/min (ref >60)
GFR, EST NON AFRICAN AMERICAN: 59 mL/min — AB (ref >60)
Glucose, Bld: 92 mg/dL (ref 65–99)
Potassium: 3.6 mmol/L (ref 3.5–5.1)
Sodium: 138 mmol/L (ref 135–145)
Total Protein: 6.2 g/dL — ABNORMAL LOW (ref 6.5–8.1)

## 2017-12-08 LAB — CBC WITH DIFFERENTIAL/PLATELET
BASOS PCT: 1 %
Basophils Absolute: 0.1 10*3/uL (ref 0–0.1)
EOS ABS: 0.1 10*3/uL (ref 0–0.7)
EOS PCT: 1 %
HEMATOCRIT: 45.7 % (ref 40.0–52.0)
Hemoglobin: 15.4 g/dL (ref 13.0–18.0)
Lymphocytes Relative: 30 %
Lymphs Abs: 1.8 10*3/uL (ref 1.0–3.6)
MCH: 31.1 pg (ref 26.0–34.0)
MCHC: 33.8 g/dL (ref 32.0–36.0)
MCV: 92.1 fL (ref 80.0–100.0)
MONO ABS: 0.6 10*3/uL (ref 0.2–1.0)
Monocytes Relative: 10 %
NEUTROS ABS: 3.4 10*3/uL (ref 1.4–6.5)
Neutrophils Relative %: 58 %
PLATELETS: 158 10*3/uL (ref 150–440)
RBC: 4.96 MIL/uL (ref 4.40–5.90)
RDW: 15 % — AB (ref 11.5–14.5)
WBC: 5.9 10*3/uL (ref 3.8–10.6)

## 2017-12-08 MED ORDER — DEXAMETHASONE SODIUM PHOSPHATE 100 MG/10ML IJ SOLN
20.0000 mg | Freq: Once | INTRAMUSCULAR | Status: AC
  Administered 2017-12-08: 20 mg via INTRAVENOUS
  Filled 2017-12-08: qty 2

## 2017-12-08 MED ORDER — ACETAMINOPHEN 325 MG PO TABS
650.0000 mg | ORAL_TABLET | Freq: Once | ORAL | Status: AC
  Administered 2017-12-08: 650 mg via ORAL
  Filled 2017-12-08: qty 2

## 2017-12-08 MED ORDER — SODIUM CHLORIDE 0.9 % IV SOLN
1000.0000 mg | Freq: Once | INTRAVENOUS | Status: AC
  Administered 2017-12-08: 1000 mg via INTRAVENOUS
  Filled 2017-12-08: qty 40

## 2017-12-08 MED ORDER — SODIUM CHLORIDE 0.9 % IV SOLN
Freq: Once | INTRAVENOUS | Status: AC
  Administered 2017-12-08: 10:00:00 via INTRAVENOUS
  Filled 2017-12-08: qty 1000

## 2017-12-08 MED ORDER — DIPHENHYDRAMINE HCL 50 MG/ML IJ SOLN
50.0000 mg | Freq: Once | INTRAMUSCULAR | Status: AC
  Administered 2017-12-08: 50 mg via INTRAVENOUS
  Filled 2017-12-08: qty 1

## 2017-12-08 NOTE — Assessment & Plan Note (Addendum)
#   CLL Rai 13q del. White count is 108 [predominant lymphocytosis]; hemoglobin 15 platelets 146 stable.   # Currently on Gazyva s/p cycle #4 ; tolerated well except for  significant improvement of the blood counts so far.  # Proceed with Gazyva  cycle #5  today. Labs today reviewed;  acceptable for treatment today.   # Afib/bradycardia s/p PPM at [facility] candidate for blood thinners-  on Asprin; not interested in coumadin. ? Other NOAKS- xarelto/pradaxa.   # CKD stage III- 1.3/stable.   # Elevated PSA [s/p Bx- negative for cancer]- improving [from 8 to 4]- Dr.Wofle; PSA improving  # continue infectious prophylaxis; recommend acyclovir prophylaxis; Bactrim prophylaxis.   # follow up in 4 weeks/labs/infusion again.

## 2017-12-08 NOTE — Progress Notes (Signed)
[place] OFFICE PROGRESS NOTE   SUMMARY OF ONCOLOGIC HISTORY:  Oncology History   3 Lambda positive B-cell CLL, RAI stage 0, lymphocytosis only. 10/08/06 CT of chest, abdomen and pelvis - No evidence of splenomegaly. No evidence of lymphadenopathy in the chest, abdomen, or pelvis. 10/04/06 flow cytometry of peripheral blood - A monoclonal population of lambda positive B-cells was detected. Phenotype consistent with diagnosis of B cell chronic lymphocytic leukemia/small lymphocytic lymphoma. CD38 positive. 03/06/08 CT Chest abdomen Pelvis:No findings to suggest recurrent disease. No adenopathy. 06/04/09: CT chest/abdomen/pelvis: No evidence of lymphadenopathy/splenomegaly. 06/04/10: ct chest/abdomen/pelvis: no evidence of active malignancy within the thorax or  abdomen or pelvis.  # CLL- 13q del; July 2017 [facility] ordered.   # Sep 27th CT scan- LN+ splenomegaly ------------------------------------------------- --  # OCT 3rd 2018- Gazyva [No chlorambucil sec to insurance issues]  # Prostate Biopsy [PSA ~5; Dr.Wolfe]; June 2017; Enlarged prostate- s/p ? cryo Dr.Wolfe.   # CKD [creat 1.5]     CLL (chronic lymphocytic leukemia) ([place])    INTERVAL HISTORY:  A very pleasant 84 year old male patient with above history of CLL Is here currently s/p cycle #4 of Gazyva approximately 4 weeks ago.   He denied any chest pain or chest pressure.  Denies any cough. Currently his symptoms have improved.  Back to baseline. Denies any new lumps or bumps.  Denies any swelling in the legs.  Unfortunately patient was unable to fill his Eliquis given costs/co-pay issues.  He is currently on baby aspirin.  He does not want to go on Coumadin  REVIEW OF SYSTEMS:  A complete 10 point review of system is done which is negative except mentioned above/history of present illness.   PAST MEDICAL HISTORY :  Past Medical History:  Diagnosis Date  . Bladder neck obstruction   . Cataracts, bilateral   .  Chronic lymphocytic leukemia (CLL), B-cell (HCC)    Lambda positive B-cell CLL  . Gout   . Hypercholesterolemia   . Hypertension   . Lymphocytosis   . Sleep apnea   . Thrombocytopenia ([place])     PAST SURGICAL HISTORY :   Past Surgical History:  Procedure Laterality Date  . COLONOSCOPY WITH PROPOFOL N/A 09/10/2015   Procedure: COLONOSCOPY WITH PROPOFOL;  Surgeon: Lucilla Lame, MD;  Location: [facility] ENDOSCOPY;  Service: Endoscopy;  Laterality: N/A;  . PROSTATE SURGERY  2008    FAMILY HISTORY :   Family History  Problem Relation Age of Onset  . Lung cancer Mother 71       deceased  . Brain cancer Son 30       pt's only son  . Polycythemia Brother        half brother  . Prostate cancer Brother     SOCIAL HISTORY:   Social History   Tobacco Use  . Smoking status: Never Smoker  . Smokeless tobacco: Never Used  Substance Use Topics  . Alcohol use: No    Alcohol/week: 0.0 oz  . Drug use: No    ALLERGIES:  is allergic to penicillins.  MEDICATIONS:  Current Outpatient Medications  Medication Sig Dispense Refill  . acetaminophen (TYLENOL) 325 MG tablet Take 650 mg by mouth daily. Take 650 mg daily - 2 days prior to [facility] IV infusion    . acyclovir (ZOVIRAX) 400 MG tablet One pill a day [to prevent shingles] 60 tablet 3  . allopurinol (ZYLOPRIM) 100 MG tablet Take 200 mg by mouth daily.     Marland Kitchen amLODipine (  NORVASC) 5 MG tablet Take 5 mg by mouth daily.     Marland Kitchen apixaban (ELIQUIS) 5 MG TABS tablet Take 1 tablet (5 mg total) by mouth 2 (two) times daily. 60 tablet 5  . atorvastatin (LIPITOR) 20 MG tablet Take 20 mg by mouth daily. Take 1/2 tablet daily    . dexamethasone (DECADRON) 4 MG tablet Start 2 days prior to infusion; Take for 2 days. Do not take on the day of infusion. 60 tablet 3  . lisinopril-hydrochlorothiazide (PRINZIDE,ZESTORETIC) 20-12.5 MG tablet Take 1 tablet by mouth daily.    Marland Kitchen loratadine (CLARITIN) 10 MG tablet Take 10 mg by mouth daily. Take 1 tablet daily starting  2 days prior to [facility] IV infusion    . montelukast (SINGULAIR) 10 MG tablet Take 1 tablet (10 mg total) by mouth at bedtime. Start 2 days prior to infusion. Take it for 4 days. 60 tablet 0  . ondansetron (ZOFRAN) 8 MG tablet 1 pill every 8 hours as needed for nausea/vomitting 40 tablet 1  . ranitidine (ZANTAC) 150 MG capsule Take 150 mg by mouth 2 (two) times daily. Start taking 2 days prior to [facility] IV infusion    . prochlorperazine (COMPAZINE) 10 MG tablet Take 1 tablet (10 mg total) by mouth every 6 (six) hours as needed for nausea or vomiting. (Patient not taking: Reported on 12/08/2017) 40 tablet 1   No current facility-administered medications for this visit.     PHYSICAL EXAMINATION: ECOG PERFORMANCE STATUS: 0 - Asymptomatic  BP 133/81   Pulse 73   Temp 97.9 F (36.6 C) (Tympanic)   Resp 20   Ht 5\' 7"  (1.702 m)   Wt 227 lb 12.8 oz (103.3 kg)   BMI 35.68 kg/m   Filed Weights   12/08/17 0843  Weight: 227 lb 12.8 oz (103.3 kg)    GENERAL: Well-nourished well-developed; Alert, no distress and comfortable.   He is with his wife.  EYES: no pallor or icterus OROPHARYNX: no thrush or ulceration; good dentition  NECK: supple, no masses felt LYMPH:  no palpable lymphadenopathy in the cervical or inguinal regions. 1-2 cm lymph nodes noted in the right axilla. LUNGS: clear to auscultation and  No wheeze or crackles HEART/CVS: regular rate & rhythm and no murmurs; No lower extremity edema ABDOMEN:abdomen soft, non-tender and normal bowel sounds; improved splenomegaly. Musculoskeletal:no cyanosis of digits and no clubbing  PSYCH: alert & oriented x 3 with fluent speech NEURO: no focal motor/sensory deficits SKIN:  no rashes or significant lesions  LABORATORY DATA:  I have reviewed the data as listed    Component Value Date/Time   NA 138 12/08/2017 0755   NA 137 09/14/2014 0834   K 3.6 12/08/2017 0755   K 4.0 09/14/2014 0834   CL 105 12/08/2017 0755   CL 101 09/14/2014 0834    CO2 24 12/08/2017 0755   CO2 27 09/14/2014 0834   GLUCOSE 92 12/08/2017 0755   GLUCOSE 123 (H) 09/14/2014 0834   BUN 20 12/08/2017 0755   BUN 14 09/14/2014 0834   CREATININE 1.10 12/08/2017 0755   CREATININE 1.14 02/05/2015 0949   CALCIUM 9.0 12/08/2017 0755   CALCIUM 8.8 09/14/2014 0834   PROT 6.2 (L) 12/08/2017 0755   PROT 6.7 09/14/2014 0834   ALBUMIN 4.1 12/08/2017 0755   ALBUMIN 3.6 09/14/2014 0834   AST 24 12/08/2017 0755   AST 21 09/14/2014 0834   ALT 15 (L) 12/08/2017 0755   ALT 26 09/14/2014 0834   ALKPHOS 30 (  L) 12/08/2017 0755   ALKPHOS 49 09/14/2014 0834   BILITOT 0.9 12/08/2017 0755   BILITOT 1.1 (H) 09/14/2014 0834   GFRNONAA 59 (L) 12/08/2017 0755   GFRNONAA 60 (L) 02/05/2015 0949   GFRAA >60 12/08/2017 0755   GFRAA >60 02/05/2015 0949    No results found for: SPEP, UPEP  Lab Results  Component Value Date   WBC 5.9 12/08/2017   NEUTROABS 3.4 12/08/2017   HGB 15.4 12/08/2017   HCT 45.7 12/08/2017   MCV 92.1 12/08/2017   PLT 158 12/08/2017      Chemistry      Component Value Date/Time   NA 138 12/08/2017 0755   NA 137 09/14/2014 0834   K 3.6 12/08/2017 0755   K 4.0 09/14/2014 0834   CL 105 12/08/2017 0755   CL 101 09/14/2014 0834   CO2 24 12/08/2017 0755   CO2 27 09/14/2014 0834   BUN 20 12/08/2017 0755   BUN 14 09/14/2014 0834   CREATININE 1.10 12/08/2017 0755   CREATININE 1.14 02/05/2015 0949      Component Value Date/Time   CALCIUM 9.0 12/08/2017 0755   CALCIUM 8.8 09/14/2014 0834   ALKPHOS 30 (L) 12/08/2017 0755   ALKPHOS 49 09/14/2014 0834   AST 24 12/08/2017 0755   AST 21 09/14/2014 0834   ALT 15 (L) 12/08/2017 0755   ALT 26 09/14/2014 0834   BILITOT 0.9 12/08/2017 0755   BILITOT 1.1 (H) 09/14/2014 0834      IMPRESSION: 1. Splenomegaly, splenic volume estimated at 1000 cc. 2. Abnormal number of scattered lymph nodes in the abdomen and pelvis ; some of these are enlarged as detailed above. These may be a manifestation of  the patient's CLL. 3. Stranding along some omental adipose tissue in the left lower quadrant may be from a remote omental infarct or less likely appendagitis epiploica. Correlate with any point tenderness in this vicinity. 4. Other imaging findings of potential clinical significance: Coronary atherosclerosis. Aortic valve calcification. Thinning of the left ventricle, query prior myocardial infarct. Several renal cysts including a Bosniak category 2 cyst of the right kidney upper pole. Hypodense lesions in segment 4 of the liver and in both kidneys are technically too small to characterize although statistically likely to be benign. Sigmoid diverticulosis. Aortic Atherosclerosis (ICD10-I70.0). Prostatomegaly. Lumbar spondylosis and degenerative disc disease causing lower lumbar impingement.  Electronically Signed: By: Van Clines M.D. On: 07/29/2017 14:13   ASSESSMENT & PLAN:   CLL (chronic lymphocytic leukemia) ([place]) # CLL Rai 13q del. White count is 108 [predominant lymphocytosis]; hemoglobin 15 platelets 146 stable.   # Currently on Gazyva s/p cycle #4 ; tolerated well except for  significant improvement of the blood counts so far.  # Proceed with Gazyva  cycle #5  today. Labs today reviewed;  acceptable for treatment today.   # Afib/bradycardia s/p PPM at [facility] candidate for blood thinners-  on Asprin; not interested in coumadin. ? Other NOAKS- xarelto/pradaxa.   # CKD stage III- 1.3/stable.   # Elevated PSA [s/p Bx- negative for cancer]- improving [from 8 to 4]- Dr.Wofle; PSA improving  # continue infectious prophylaxis; recommend acyclovir prophylaxis; Bactrim prophylaxis.   # follow up in 4 weeks/labs/infusion again.       Cammie Sickle, MD 12/08/2017 2:16 PM

## 2017-12-30 DIAGNOSIS — Z45018 Encounter for adjustment and management of other part of cardiac pacemaker: Secondary | ICD-10-CM | POA: Diagnosis not present

## 2017-12-30 DIAGNOSIS — I481 Persistent atrial fibrillation: Secondary | ICD-10-CM | POA: Diagnosis not present

## 2018-01-05 ENCOUNTER — Inpatient Hospital Stay: Payer: Medicare Other

## 2018-01-05 ENCOUNTER — Inpatient Hospital Stay: Payer: Medicare Other | Attending: Internal Medicine

## 2018-01-05 ENCOUNTER — Other Ambulatory Visit: Payer: Self-pay

## 2018-01-05 ENCOUNTER — Inpatient Hospital Stay (HOSPITAL_BASED_OUTPATIENT_CLINIC_OR_DEPARTMENT_OTHER): Payer: Medicare Other | Admitting: Internal Medicine

## 2018-01-05 VITALS — BP 122/74 | HR 80 | Temp 97.9°F | Resp 20 | Ht 67.0 in | Wt 231.0 lb

## 2018-01-05 VITALS — BP 104/66 | HR 60 | Resp 20

## 2018-01-05 DIAGNOSIS — C9110 Chronic lymphocytic leukemia of B-cell type not having achieved remission: Secondary | ICD-10-CM

## 2018-01-05 DIAGNOSIS — I2510 Atherosclerotic heart disease of native coronary artery without angina pectoris: Secondary | ICD-10-CM | POA: Diagnosis not present

## 2018-01-05 DIAGNOSIS — I4891 Unspecified atrial fibrillation: Secondary | ICD-10-CM | POA: Insufficient documentation

## 2018-01-05 DIAGNOSIS — E7800 Pure hypercholesterolemia, unspecified: Secondary | ICD-10-CM | POA: Insufficient documentation

## 2018-01-05 DIAGNOSIS — Z7982 Long term (current) use of aspirin: Secondary | ICD-10-CM

## 2018-01-05 DIAGNOSIS — Z880 Allergy status to penicillin: Secondary | ICD-10-CM | POA: Insufficient documentation

## 2018-01-05 DIAGNOSIS — Z8042 Family history of malignant neoplasm of prostate: Secondary | ICD-10-CM | POA: Insufficient documentation

## 2018-01-05 DIAGNOSIS — Z5112 Encounter for antineoplastic immunotherapy: Secondary | ICD-10-CM | POA: Insufficient documentation

## 2018-01-05 DIAGNOSIS — Z808 Family history of malignant neoplasm of other organs or systems: Secondary | ICD-10-CM

## 2018-01-05 DIAGNOSIS — I129 Hypertensive chronic kidney disease with stage 1 through stage 4 chronic kidney disease, or unspecified chronic kidney disease: Secondary | ICD-10-CM | POA: Diagnosis not present

## 2018-01-05 DIAGNOSIS — Z79899 Other long term (current) drug therapy: Secondary | ICD-10-CM | POA: Diagnosis not present

## 2018-01-05 DIAGNOSIS — N189 Chronic kidney disease, unspecified: Secondary | ICD-10-CM | POA: Diagnosis not present

## 2018-01-05 DIAGNOSIS — N400 Enlarged prostate without lower urinary tract symptoms: Secondary | ICD-10-CM | POA: Insufficient documentation

## 2018-01-05 DIAGNOSIS — Z801 Family history of malignant neoplasm of trachea, bronchus and lung: Secondary | ICD-10-CM | POA: Insufficient documentation

## 2018-01-05 LAB — CBC WITH DIFFERENTIAL/PLATELET
BASOS PCT: 0 %
Basophils Absolute: 0 10*3/uL (ref 0–0.1)
EOS ABS: 0 10*3/uL (ref 0–0.7)
Eosinophils Relative: 0 %
HCT: 43.8 % (ref 40.0–52.0)
HEMOGLOBIN: 15.3 g/dL (ref 13.0–18.0)
LYMPHS ABS: 0.9 10*3/uL — AB (ref 1.0–3.6)
Lymphocytes Relative: 12 %
MCH: 31.6 pg (ref 26.0–34.0)
MCHC: 34.9 g/dL (ref 32.0–36.0)
MCV: 90.6 fL (ref 80.0–100.0)
MONO ABS: 0.2 10*3/uL (ref 0.2–1.0)
MONOS PCT: 3 %
Neutro Abs: 6.2 10*3/uL (ref 1.4–6.5)
Neutrophils Relative %: 85 %
Platelets: 168 10*3/uL (ref 150–440)
RBC: 4.84 MIL/uL (ref 4.40–5.90)
RDW: 15 % — AB (ref 11.5–14.5)
WBC: 7.2 10*3/uL (ref 3.8–10.6)

## 2018-01-05 LAB — COMPREHENSIVE METABOLIC PANEL
ALT: 12 U/L — ABNORMAL LOW (ref 17–63)
ANION GAP: 9 (ref 5–15)
AST: 28 U/L (ref 15–41)
Albumin: 3.9 g/dL (ref 3.5–5.0)
Alkaline Phosphatase: 29 U/L — ABNORMAL LOW (ref 38–126)
BILIRUBIN TOTAL: 0.9 mg/dL (ref 0.3–1.2)
BUN: 17 mg/dL (ref 6–20)
CO2: 22 mmol/L (ref 22–32)
Calcium: 8.9 mg/dL (ref 8.9–10.3)
Chloride: 107 mmol/L (ref 101–111)
Creatinine, Ser: 1.17 mg/dL (ref 0.61–1.24)
GFR calc Af Amer: >60 mL/min (ref >60)
GFR, EST NON AFRICAN AMERICAN: 55 mL/min — AB (ref >60)
Glucose, Bld: 200 mg/dL — ABNORMAL HIGH (ref 65–99)
POTASSIUM: 3.7 mmol/L (ref 3.5–5.1)
Sodium: 138 mmol/L (ref 135–145)
TOTAL PROTEIN: 6.2 g/dL — AB (ref 6.5–8.1)

## 2018-01-05 LAB — LACTATE DEHYDROGENASE: LDH: 139 U/L (ref 98–192)

## 2018-01-05 MED ORDER — ACETAMINOPHEN 325 MG PO TABS
650.0000 mg | ORAL_TABLET | Freq: Once | ORAL | Status: AC
  Administered 2018-01-05: 650 mg via ORAL
  Filled 2018-01-05: qty 2

## 2018-01-05 MED ORDER — SODIUM CHLORIDE 0.9 % IV SOLN
20.0000 mg | Freq: Once | INTRAVENOUS | Status: AC
  Administered 2018-01-05: 20 mg via INTRAVENOUS
  Filled 2018-01-05: qty 2

## 2018-01-05 MED ORDER — DIPHENHYDRAMINE HCL 50 MG/ML IJ SOLN
50.0000 mg | Freq: Once | INTRAMUSCULAR | Status: AC
  Administered 2018-01-05: 50 mg via INTRAVENOUS
  Filled 2018-01-05: qty 1

## 2018-01-05 MED ORDER — SODIUM CHLORIDE 0.9 % IV SOLN
Freq: Once | INTRAVENOUS | Status: AC
  Administered 2018-01-05: 10:00:00 via INTRAVENOUS
  Filled 2018-01-05: qty 1000

## 2018-01-05 MED ORDER — SODIUM CHLORIDE 0.9 % IV SOLN
1000.0000 mg | Freq: Once | INTRAVENOUS | Status: AC
  Administered 2018-01-05: 1000 mg via INTRAVENOUS
  Filled 2018-01-05: qty 40

## 2018-01-05 NOTE — Progress Notes (Signed)
[place] OFFICE PROGRESS NOTE   SUMMARY OF ONCOLOGIC HISTORY:  Oncology History   3 Lambda positive B-cell CLL, RAI stage 0, lymphocytosis only. 10/08/06 CT of chest, abdomen and pelvis - No evidence of splenomegaly. No evidence of lymphadenopathy in the chest, abdomen, or pelvis. 10/04/06 flow cytometry of peripheral blood - A monoclonal population of lambda positive B-cells was detected. Phenotype consistent with diagnosis of B cell chronic lymphocytic leukemia/small lymphocytic lymphoma. CD38 positive. 03/06/08 CT Chest abdomen Pelvis:No findings to suggest recurrent disease. No adenopathy. 06/04/09: CT chest/abdomen/pelvis: No evidence of lymphadenopathy/splenomegaly. 06/04/10: ct chest/abdomen/pelvis: no evidence of active malignancy within the thorax or  abdomen or pelvis.  # CLL- 13q del; July 2017 [facility] ordered.   # Sep 27th CT scan- LN+ splenomegaly ------------------------------------------------- --  # OCT 3rd 2018- Gazyva [No chlorambucil sec to insurance issues]  # Prostate Biopsy [PSA ~5; Dr.Wolfe]; June 2017; Enlarged prostate- s/p ? cryo Dr.Wolfe.   # CKD [creat 1.5]     CLL (chronic lymphocytic leukemia) ([place])    INTERVAL HISTORY:  A very pleasant 84 year old male patient with above history of CLL Is here currently s/p cycle #5 of Gazyva approximately 4 weeks ago.   Patient denies any night sweats or lumps of bumps.  Appetite is improved.  No recent hospitalizations.  No chest pain or shortness of breath or cough.  No fevers or chills.  He is currently on baby aspirin.   REVIEW OF SYSTEMS:  A complete 10 point review of system is done which is negative except mentioned above/history of present illness.   PAST MEDICAL HISTORY :  Past Medical History:  Diagnosis Date  . Bladder neck obstruction   . Cataracts, bilateral   . Chronic lymphocytic leukemia (CLL), B-cell (HCC)    Lambda positive B-cell CLL  . Gout   . Hypercholesterolemia   .  Hypertension   . Lymphocytosis   . Sleep apnea   . Thrombocytopenia ([place])     PAST SURGICAL HISTORY :   Past Surgical History:  Procedure Laterality Date  . COLONOSCOPY WITH PROPOFOL N/A 09/10/2015   Procedure: COLONOSCOPY WITH PROPOFOL;  Surgeon: Lucilla Lame, MD;  Location: [facility] ENDOSCOPY;  Service: Endoscopy;  Laterality: N/A;  . PROSTATE SURGERY  2008    FAMILY HISTORY :   Family History  Problem Relation Age of Onset  . Lung cancer Mother 37       deceased  . Brain cancer Son 45       pt's only son  . Polycythemia Brother        half brother  . Prostate cancer Brother     SOCIAL HISTORY:   Social History   Tobacco Use  . Smoking status: Never Smoker  . Smokeless tobacco: Never Used  Substance Use Topics  . Alcohol use: No    Alcohol/week: 0.0 oz  . Drug use: No    ALLERGIES:  is allergic to penicillins.  MEDICATIONS:  Current Outpatient Medications  Medication Sig Dispense Refill  . acetaminophen (TYLENOL) 325 MG tablet Take 650 mg by mouth daily. Take 650 mg daily - 2 days prior to [facility] IV infusion    . acyclovir (ZOVIRAX) 400 MG tablet One pill a day [to prevent shingles] 60 tablet 3  . allopurinol (ZYLOPRIM) 100 MG tablet Take 200 mg by mouth daily.     Marland Kitchen amLODipine (NORVASC) 5 MG tablet Take 5 mg by mouth daily.     Marland Kitchen apixaban (ELIQUIS) 5 MG TABS tablet Take  1 tablet (5 mg total) by mouth 2 (two) times daily. 60 tablet 5  . atorvastatin (LIPITOR) 20 MG tablet Take 20 mg by mouth daily. Take 1/2 tablet daily    . dexamethasone (DECADRON) 4 MG tablet Start 2 days prior to infusion; Take for 2 days. Do not take on the day of infusion. 60 tablet 3  . lisinopril-hydrochlorothiazide (PRINZIDE,ZESTORETIC) 20-12.5 MG tablet Take 1 tablet by mouth daily.    Marland Kitchen loratadine (CLARITIN) 10 MG tablet Take 10 mg by mouth daily. Take 1 tablet daily starting 2 days prior to [facility] IV infusion    . montelukast (SINGULAIR) 10 MG tablet Take 1 tablet (10 mg total) by mouth at  bedtime. Start 2 days prior to infusion. Take it for 4 days. 60 tablet 0  . ranitidine (ZANTAC) 150 MG capsule Take 150 mg by mouth 2 (two) times daily. Start taking 2 days prior to [facility] IV infusion    . ondansetron (ZOFRAN) 8 MG tablet 1 pill every 8 hours as needed for nausea/vomitting (Patient not taking: Reported on 01/05/2018) 40 tablet 1  . prochlorperazine (COMPAZINE) 10 MG tablet Take 1 tablet (10 mg total) by mouth every 6 (six) hours as needed for nausea or vomiting. (Patient not taking: Reported on 12/08/2017) 40 tablet 1   No current facility-administered medications for this visit.     PHYSICAL EXAMINATION: ECOG PERFORMANCE STATUS: 0 - Asymptomatic  BP 122/74 (Patient Position: Sitting)   Pulse 80   Temp 97.9 F (36.6 C) (Oral)   Resp 20   Ht 5\' 7"  (1.702 m)   Wt 231 lb (104.8 kg)   BMI 36.18 kg/m   Filed Weights   01/05/18 0904  Weight: 231 lb (104.8 kg)    GENERAL: Well-nourished well-developed; Alert, no distress and comfortable.   He is with his wife.  EYES: no pallor or icterus OROPHARYNX: no thrush or ulceration; good dentition  NECK: supple, no masses felt LYMPH:  no palpable lymphadenopathy in the cervical or inguinal regions. 1-2 cm lymph nodes noted in the right axilla. LUNGS: clear to auscultation and  No wheeze or crackles HEART/CVS: regular rate & rhythm and no murmurs; No lower extremity edema ABDOMEN:abdomen soft, non-tender and normal bowel sounds; improved splenomegaly. Musculoskeletal:no cyanosis of digits and no clubbing  PSYCH: alert & oriented x 3 with fluent speech NEURO: no focal motor/sensory deficits SKIN:  no rashes or significant lesions  LABORATORY DATA:  I have reviewed the data as listed    Component Value Date/Time   NA 138 01/05/2018 0812   NA 137 09/14/2014 0834   K 3.7 01/05/2018 0812   K 4.0 09/14/2014 0834   CL 107 01/05/2018 0812   CL 101 09/14/2014 0834   CO2 22 01/05/2018 0812   CO2 27 09/14/2014 0834   GLUCOSE 200  (H) 01/05/2018 0812   GLUCOSE 123 (H) 09/14/2014 0834   BUN 17 01/05/2018 0812   BUN 14 09/14/2014 0834   CREATININE 1.17 01/05/2018 0812   CREATININE 1.14 02/05/2015 0949   CALCIUM 8.9 01/05/2018 0812   CALCIUM 8.8 09/14/2014 0834   PROT 6.2 (L) 01/05/2018 0812   PROT 6.7 09/14/2014 0834   ALBUMIN 3.9 01/05/2018 0812   ALBUMIN 3.6 09/14/2014 0834   AST 28 01/05/2018 0812   AST 21 09/14/2014 0834   ALT 12 (L) 01/05/2018 0812   ALT 26 09/14/2014 0834   ALKPHOS 29 (L) 01/05/2018 0812   ALKPHOS 49 09/14/2014 0834   BILITOT 0.9 01/05/2018 5284  BILITOT 1.1 (H) 09/14/2014 0834   GFRNONAA 55 (L) 01/05/2018 0812   GFRNONAA 60 (L) 02/05/2015 0949   GFRAA >60 01/05/2018 0812   GFRAA >60 02/05/2015 0949    No results found for: SPEP, UPEP  Lab Results  Component Value Date   WBC 7.2 01/05/2018   NEUTROABS 6.2 01/05/2018   HGB 15.3 01/05/2018   HCT 43.8 01/05/2018   MCV 90.6 01/05/2018   PLT 168 01/05/2018      Chemistry      Component Value Date/Time   NA 138 01/05/2018 0812   NA 137 09/14/2014 0834   K 3.7 01/05/2018 0812   K 4.0 09/14/2014 0834   CL 107 01/05/2018 0812   CL 101 09/14/2014 0834   CO2 22 01/05/2018 0812   CO2 27 09/14/2014 0834   BUN 17 01/05/2018 0812   BUN 14 09/14/2014 0834   CREATININE 1.17 01/05/2018 0812   CREATININE 1.14 02/05/2015 0949      Component Value Date/Time   CALCIUM 8.9 01/05/2018 0812   CALCIUM 8.8 09/14/2014 0834   ALKPHOS 29 (L) 01/05/2018 0812   ALKPHOS 49 09/14/2014 0834   AST 28 01/05/2018 0812   AST 21 09/14/2014 0834   ALT 12 (L) 01/05/2018 0812   ALT 26 09/14/2014 0834   BILITOT 0.9 01/05/2018 0812   BILITOT 1.1 (H) 09/14/2014 0834      IMPRESSION: 1. Splenomegaly, splenic volume estimated at 1000 cc. 2. Abnormal number of scattered lymph nodes in the abdomen and pelvis ; some of these are enlarged as detailed above. These may be a manifestation of the patient's CLL. 3. Stranding along some omental adipose  tissue in the left lower quadrant may be from a remote omental infarct or less likely appendagitis epiploica. Correlate with any point tenderness in this vicinity. 4. Other imaging findings of potential clinical significance: Coronary atherosclerosis. Aortic valve calcification. Thinning of the left ventricle, query prior myocardial infarct. Several renal cysts including a Bosniak category 2 cyst of the right kidney upper pole. Hypodense lesions in segment 4 of the liver and in both kidneys are technically too small to characterize although statistically likely to be benign. Sigmoid diverticulosis. Aortic Atherosclerosis (ICD10-I70.0). Prostatomegaly. Lumbar spondylosis and degenerative disc disease causing lower lumbar impingement.  Electronically Signed: By: Van Clines M.D. On: 07/29/2017 14:13   ASSESSMENT & PLAN:   CLL (chronic lymphocytic leukemia) ([place]) # CLL Rai 13q del. White count is 108 [predominant lymphocytosis]; hemoglobin 15 platelets 146 stable. Currently on Gazyva s/p cycle #5 tolerating well.   # Proceed with Gazyva  cycle #6  today. Labs today reviewed;  acceptable for treatment today.   # Afib/bradycardia s/p PPM at [facility] candidate for blood thinners-  on Asprin; not interested in coumadin. ? Other NOAKS- xarelto/pradaxa.   # CKD stage III- 1.3/stable.   # Elevated PSA [s/p Bx- negative for cancer]- improving [from 8 to 4]- Dr.Wofle; PSA improving  # Continue infectious prophylaxis; recommend acyclovir prophylaxis; Bactrim prophylaxis- for 2 more months.   # follow up with me in 2 months/labs- CT prior; [place].       Cammie Sickle, MD 01/05/2018 1:20 PM

## 2018-01-05 NOTE — Assessment & Plan Note (Addendum)
#   CLL Rai 13q del. White count is 108 [predominant lymphocytosis]; hemoglobin 15 platelets 146 stable. Currently on Gazyva s/p cycle #5 tolerating well.   # Proceed with Gazyva  cycle #6  today. Labs today reviewed;  acceptable for treatment today.   # Afib/bradycardia s/p PPM at [facility] candidate for blood thinners-  on Asprin; not interested in coumadin. ? Other NOAKS [unable to afford] xarelto/pradaxa.   # CKD stage III- 1.3/stable.   # Elevated PSA [s/p Bx- negative for cancer]- improving [from 8 to 4]- Dr.Wofle; PSA improving  # Continue infectious prophylaxis; recommend acyclovir prophylaxis; Bactrim prophylaxis- for 2 more months.   # follow up with me in 2 months/labs- CT prior; [place].

## 2018-02-18 ENCOUNTER — Telehealth: Payer: Self-pay | Admitting: *Deleted

## 2018-02-18 ENCOUNTER — Encounter: Payer: Self-pay | Admitting: Oncology

## 2018-02-18 ENCOUNTER — Other Ambulatory Visit: Payer: Self-pay

## 2018-02-18 ENCOUNTER — Inpatient Hospital Stay: Payer: Medicare Other | Attending: Oncology | Admitting: Oncology

## 2018-02-18 ENCOUNTER — Inpatient Hospital Stay: Payer: Medicare Other

## 2018-02-18 ENCOUNTER — Other Ambulatory Visit: Payer: Self-pay | Admitting: *Deleted

## 2018-02-18 ENCOUNTER — Ambulatory Visit: Payer: Medicare Other

## 2018-02-18 ENCOUNTER — Inpatient Hospital Stay: Payer: Medicare Other | Admitting: Oncology

## 2018-02-18 VITALS — BP 159/86 | HR 70 | Temp 100.0°F | Resp 20 | Wt 243.0 lb

## 2018-02-18 DIAGNOSIS — Z79899 Other long term (current) drug therapy: Secondary | ICD-10-CM | POA: Diagnosis not present

## 2018-02-18 DIAGNOSIS — R600 Localized edema: Secondary | ICD-10-CM | POA: Diagnosis not present

## 2018-02-18 DIAGNOSIS — Z8042 Family history of malignant neoplasm of prostate: Secondary | ICD-10-CM | POA: Diagnosis not present

## 2018-02-18 DIAGNOSIS — R112 Nausea with vomiting, unspecified: Secondary | ICD-10-CM

## 2018-02-18 DIAGNOSIS — D696 Thrombocytopenia, unspecified: Secondary | ICD-10-CM | POA: Insufficient documentation

## 2018-02-18 DIAGNOSIS — C9110 Chronic lymphocytic leukemia of B-cell type not having achieved remission: Secondary | ICD-10-CM | POA: Insufficient documentation

## 2018-02-18 DIAGNOSIS — R509 Fever, unspecified: Secondary | ICD-10-CM | POA: Diagnosis not present

## 2018-02-18 DIAGNOSIS — I10 Essential (primary) hypertension: Secondary | ICD-10-CM | POA: Insufficient documentation

## 2018-02-18 DIAGNOSIS — E860 Dehydration: Secondary | ICD-10-CM

## 2018-02-18 DIAGNOSIS — R110 Nausea: Secondary | ICD-10-CM

## 2018-02-18 DIAGNOSIS — Z801 Family history of malignant neoplasm of trachea, bronchus and lung: Secondary | ICD-10-CM | POA: Diagnosis not present

## 2018-02-18 DIAGNOSIS — E7800 Pure hypercholesterolemia, unspecified: Secondary | ICD-10-CM | POA: Insufficient documentation

## 2018-02-18 LAB — CBC WITH DIFFERENTIAL/PLATELET
Basophils Absolute: 0.1 10*3/uL (ref 0–0.1)
Basophils Relative: 1 %
Eosinophils Absolute: 0 10*3/uL (ref 0–0.7)
Eosinophils Relative: 0 %
HCT: 46.3 % (ref 40.0–52.0)
HEMOGLOBIN: 15.5 g/dL (ref 13.0–18.0)
LYMPHS ABS: 1.5 10*3/uL (ref 1.0–3.6)
LYMPHS PCT: 13 %
MCH: 31 pg (ref 26.0–34.0)
MCHC: 33.5 g/dL (ref 32.0–36.0)
MCV: 92.4 fL (ref 80.0–100.0)
MONOS PCT: 8 %
Monocytes Absolute: 0.9 10*3/uL (ref 0.2–1.0)
NEUTROS PCT: 78 %
Neutro Abs: 8.8 10*3/uL — ABNORMAL HIGH (ref 1.4–6.5)
Platelets: 145 10*3/uL — ABNORMAL LOW (ref 150–440)
RBC: 5.01 MIL/uL (ref 4.40–5.90)
RDW: 15.3 % — ABNORMAL HIGH (ref 11.5–14.5)
WBC: 11.2 10*3/uL — AB (ref 3.8–10.6)

## 2018-02-18 LAB — MAGNESIUM: Magnesium: 1.9 mg/dL (ref 1.7–2.4)

## 2018-02-18 LAB — COMPREHENSIVE METABOLIC PANEL
ALBUMIN: 4.1 g/dL (ref 3.5–5.0)
ALK PHOS: 32 U/L — AB (ref 38–126)
ALT: 21 U/L (ref 17–63)
ANION GAP: 11 (ref 5–15)
AST: 26 U/L (ref 15–41)
BUN: 20 mg/dL (ref 6–20)
CO2: 19 mmol/L — ABNORMAL LOW (ref 22–32)
Calcium: 8.7 mg/dL — ABNORMAL LOW (ref 8.9–10.3)
Chloride: 101 mmol/L (ref 101–111)
Creatinine, Ser: 1.25 mg/dL — ABNORMAL HIGH (ref 0.61–1.24)
GFR calc non Af Amer: 51 mL/min — ABNORMAL LOW (ref >60)
GFR, EST AFRICAN AMERICAN: 59 mL/min — AB (ref >60)
GLUCOSE: 137 mg/dL — AB (ref 65–99)
Potassium: 3.6 mmol/L (ref 3.5–5.1)
Sodium: 131 mmol/L — ABNORMAL LOW (ref 135–145)
Total Bilirubin: 2.1 mg/dL — ABNORMAL HIGH (ref 0.3–1.2)
Total Protein: 6.4 g/dL — ABNORMAL LOW (ref 6.5–8.1)

## 2018-02-18 LAB — URINALYSIS, COMPLETE (UACMP) WITH MICROSCOPIC
BILIRUBIN URINE: NEGATIVE
Glucose, UA: NEGATIVE mg/dL
Ketones, ur: 5 mg/dL — AB
Leukocytes, UA: NEGATIVE
Nitrite: NEGATIVE
PROTEIN: 100 mg/dL — AB
SPECIFIC GRAVITY, URINE: 1.02 (ref 1.005–1.030)
pH: 6 (ref 5.0–8.0)

## 2018-02-18 LAB — BILIRUBIN, FRACTIONATED(TOT/DIR/INDIR)
Bilirubin, Direct: 0.2 mg/dL (ref 0.1–0.5)
Indirect Bilirubin: 1.7 mg/dL — ABNORMAL HIGH (ref 0.3–0.9)
Total Bilirubin: 1.9 mg/dL — ABNORMAL HIGH (ref 0.3–1.2)

## 2018-02-18 LAB — INFLUENZA PANEL BY PCR (TYPE A & B)
Influenza A By PCR: NEGATIVE
Influenza B By PCR: NEGATIVE

## 2018-02-18 MED ORDER — ACETAMINOPHEN 325 MG PO TABS
ORAL_TABLET | ORAL | Status: AC
  Filled 2018-02-18: qty 2

## 2018-02-18 MED ORDER — SODIUM CHLORIDE 0.9 % IV SOLN
INTRAVENOUS | Status: DC
Start: 2018-02-18 — End: 2018-02-18
  Administered 2018-02-18: 11:00:00 via INTRAVENOUS
  Filled 2018-02-18 (×2): qty 1000

## 2018-02-18 MED ORDER — ACETAMINOPHEN 325 MG PO TABS
650.0000 mg | ORAL_TABLET | Freq: Once | ORAL | Status: AC
  Administered 2018-02-18: 650 mg via ORAL

## 2018-02-18 MED ORDER — CIPROFLOXACIN HCL 500 MG PO TABS: 500.0000 mg | ORAL_TABLET | Freq: Two times a day (BID) | ORAL | 0 refills | Status: DC

## 2018-02-18 MED ORDER — SODIUM CHLORIDE 0.9 % IV SOLN
Freq: Once | INTRAVENOUS | Status: AC
  Administered 2018-02-18: 11:00:00 via INTRAVENOUS
  Filled 2018-02-18: qty 4

## 2018-02-18 NOTE — Progress Notes (Signed)
Symptom Management Consult note [facility]  Telephone:([phone] Fax:([phone]) [phone]  Patient Care Team: System, Provider Not In as PCP - General Cammie Sickle, MD as Consulting Physician (Internal Medicine)   Name of the patient: Brent Hoover  [id number]  17-Jul-1930   Date of visit: 02/18/18  Diagnosis- CLL  Chief complaint/ Reason for visit- Nausea/Vomiting/Fever/No appetite  Heme/Onc history: Patient previously seen by medical oncologist Dr. Rogue Bussing on 01/05/2018 for cycle 6 of Gazyva. At that time, he denied any night sweats or lumps or bumps. Appetite has improved dramatically. He had not recently been admitted to the hospital and he did not complain of chest pain or shortness of breath. He continued on acyclovir and Bactrim for prophylaxis. He was to follow up in 2 months with Dr. Rogue Bussing.  Patient was initially diagnosed via flow cytometry of peripheral blood in December 2007 with positive B-cell CLL Rai stage 0. CT chest/abdomen/pelvis from May 2009 revealed no recurrent disease or adenopathy. CT scan from August 2010 and aug 2011 revealed no evidence of disease. Patient began [place] in October 2018.   Interval history-  CORNELL BOURBON is a 84 y.o. male who presents for evaluation of nausea, vomiting, and dehydration.  It has been present for 1 day.   Associated signs & symptoms:  inability to keep down fluids, fever to 101 and suspicious food/drink: Ribs on Wednesday.  He denies any chills, flulike illnesses, dysuria, urinary frequency, abdominal pain or diarrhea.  ECOG FS:1 - Symptomatic but completely ambulatory  Review of systems- Review of Systems  Constitutional: Positive for chills and fever. Negative for malaise/fatigue and weight loss.  HENT: Negative for congestion and ear pain.   Eyes: Negative.  Negative for blurred vision and double vision.  Respiratory: Negative.  Negative for cough, sputum production and  shortness of breath.   Cardiovascular: Positive for leg swelling (mild). Negative for chest pain and palpitations.  Gastrointestinal: Positive for nausea and vomiting. Negative for abdominal pain, constipation and diarrhea.  Genitourinary: Negative for dysuria, frequency and urgency.  Musculoskeletal: Negative for back pain and falls.  Skin: Negative.  Negative for rash.  Neurological: Negative.  Negative for weakness and headaches.  Endo/Heme/Allergies: Negative.  Does not bruise/bleed easily.  Psychiatric/Behavioral: Negative.  Negative for depression. The patient is not nervous/anxious and does not have insomnia.      Current treatment- 01/05/18 Obinutuzumab  Allergies  Allergen Reactions  . Penicillins Other (See Comments)    unknown     Past Medical History:  Diagnosis Date  . Bladder neck obstruction   . Cataracts, bilateral   . Chronic lymphocytic leukemia (CLL), B-cell (HCC)    Lambda positive B-cell CLL  . Gout   . Hypercholesterolemia   . Hypertension   . Lymphocytosis   . Sleep apnea   . Thrombocytopenia ([place])      Past Surgical History:  Procedure Laterality Date  . COLONOSCOPY WITH PROPOFOL N/A 09/10/2015   Procedure: COLONOSCOPY WITH PROPOFOL;  Surgeon: Lucilla Lame, MD;  Location: [facility] ENDOSCOPY;  Service: Endoscopy;  Laterality: N/A;  . PROSTATE SURGERY  2008    Social History   Socioeconomic History  . Marital status: Married    Spouse name: Not on file  . Number of children: Not on file  . Years of education: Not on file  . Highest education level: Not on file  Occupational History  . Not on file  Social Needs  . Financial resource strain: Not on file  .  Food insecurity:    Worry: Not on file    Inability: Not on file  . Transportation needs:    Medical: Not on file    Non-medical: Not on file  Tobacco Use  . Smoking status: Never Smoker  . Smokeless tobacco: Never Used  Substance and Sexual Activity  . Alcohol use: No    Alcohol/week:  0.0 oz  . Drug use: No  . Sexual activity: Not on file  Lifestyle  . Physical activity:    Days per week: Not on file    Minutes per session: Not on file  . Stress: Not on file  Relationships  . Social connections:    Talks on phone: Not on file    Gets together: Not on file    Attends religious service: Not on file    Active member of club or organization: Not on file    Attends meetings of clubs or organizations: Not on file    Relationship status: Not on file  . Intimate partner violence:    Fear of current or ex partner: Not on file    Emotionally abused: Not on file    Physically abused: Not on file    Forced sexual activity: Not on file  Other Topics Concern  . Not on file  Social History Narrative  . Not on file    Family History  Problem Relation Age of Onset  . Lung cancer Mother 9       deceased  . Brain cancer Son 45       pt's only son  . Polycythemia Brother        half brother  . Prostate cancer Brother      Current Outpatient Medications:  .  acetaminophen (TYLENOL) 325 MG tablet, Take 650 mg by mouth daily. , Disp: , Rfl:  .  acyclovir (ZOVIRAX) 400 MG tablet, One pill a day [to prevent shingles], Disp: 60 tablet, Rfl: 3 .  allopurinol (ZYLOPRIM) 100 MG tablet, Take 200 mg by mouth daily. , Disp: , Rfl:  .  amLODipine (NORVASC) 5 MG tablet, Take 5 mg by mouth daily. , Disp: , Rfl:  .  atorvastatin (LIPITOR) 20 MG tablet, Take 20 mg by mouth daily. Take 1/2 tablet daily, Disp: , Rfl:  .  lisinopril-hydrochlorothiazide (PRINZIDE,ZESTORETIC) 20-12.5 MG tablet, Take 1 tablet by mouth daily., Disp: , Rfl:  .  montelukast (SINGULAIR) 10 MG tablet, Take 1 tablet (10 mg total) by mouth at bedtime. Start 2 days prior to infusion. Take it for 4 days., Disp: 60 tablet, Rfl: 0 .  ranitidine (ZANTAC) 150 MG capsule, Take 150 mg by mouth 2 (two) times daily. Start taking 2 days prior to [facility] IV infusion, Disp: , Rfl:  .  ciprofloxacin (CIPRO) 500 MG tablet, Take 1  tablet (500 mg total) by mouth 2 (two) times daily., Disp: 14 tablet, Rfl: 0 .  ondansetron (ZOFRAN) 8 MG tablet, 1 pill every 8 hours as needed for nausea/vomitting (Patient not taking: Reported on 02/18/2018), Disp: 40 tablet, Rfl: 1 .  prochlorperazine (COMPAZINE) 10 MG tablet, Take 1 tablet (10 mg total) by mouth every 6 (six) hours as needed for nausea or vomiting. (Patient not taking: Reported on 02/18/2018), Disp: 40 tablet, Rfl: 1 No current facility-administered medications for this visit.   Facility-Administered Medications Ordered in Other Visits:  .  0.9 %  sodium chloride infusion, , Intravenous, Continuous, Burns, Wandra Feinstein, NP, Stopped at 02/18/18 1240  Physical exam:  Vitals:  02/18/18 1006  BP: (!) 159/86  Pulse: 70  Resp: 20  Temp: 100 F (37.8 C)  TempSrc: Tympanic  SpO2: 95%  Weight: 243 lb (110.2 kg)   Physical Exam  Constitutional: He is oriented to person, place, and time. Vital signs are normal. He appears well-developed.  HENT:  Head: Normocephalic and atraumatic.  Eyes: Pupils are equal, round, and reactive to light.  Neck: Normal range of motion.  Cardiovascular: Normal rate, regular rhythm and normal heart sounds.  No murmur heard. Pulmonary/Chest: Effort normal and breath sounds normal. He has no wheezes.  Abdominal: Soft. Normal appearance and bowel sounds are normal. He exhibits no distension and no mass. There is no tenderness. There is no rebound.  Musculoskeletal: Normal range of motion. He exhibits edema (+ 1 pitting BLE).  Neurological: He is alert and oriented to person, place, and time.  Skin: Skin is warm and dry. No rash noted.  Psychiatric: Judgment normal.     CMP Latest Ref Rng & Units 02/18/2018  Glucose 65 - 99 mg/dL -  BUN 6 - 20 mg/dL -  Creatinine 0.61 - 1.24 mg/dL -  Sodium 135 - 145 mmol/L -  Potassium 3.5 - 5.1 mmol/L -  Chloride 101 - 111 mmol/L -  CO2 22 - 32 mmol/L -  Calcium 8.9 - 10.3 mg/dL -  Total Protein 6.5 - 8.1  g/dL -  Total Bilirubin 0.3 - 1.2 mg/dL 1.9(H)  Alkaline Phos 38 - 126 U/L -  AST 15 - 41 U/L -  ALT 17 - 63 U/L -   CBC Latest Ref Rng & Units 02/18/2018  WBC 3.8 - 10.6 K/uL 11.2(H)  Hemoglobin 13.0 - 18.0 g/dL 15.5  Hematocrit 40.0 - 52.0 % 46.3  Platelets 150 - 440 K/uL 145(L)    No images are attached to the encounter.  No results found.   Assessment and plan- Patient is a 84 y.o. male who presents for nausea, vomiting, fever and no appetite X 1 day.   1. CLL: Observation. Last treatment was 01/05/18 Obinutuzumab. Tolerated well. Scheduled to see Dr. Rogue Bussing on 03/08/2018.   2. Nausea/Vomiting/Dehydration: d/t severe nausea and vomiting X 1 day.  Labs reveal hyponatremia (131), elevated creatinine 1.25, elevated total Bili 2.1 and elevated WBC 11.2. Will give 1 liter fluids, 8 mg Zofran and 10 mg Decadron. Explained to patient that he needed to take Zofran as needed for nausea at home. This medication was previously prescribed by Dr. Rogue Bussing. Patient has not been taking this medication and feels significantly better after his IV dose in clinic today. He was able to keep down crackers and fluids during infusion.  3. Fever: Patient has had a fever 1 day. Highest at 101.0. Will get UA/urinalysis, influenza swab and labs. Lungs are clear on exam. No need for chest x-ray today. If patient develops a cough or chest congestion, patient instructed to return to clinic for chest x-ray. White count slightly elevated at 11.2. Patient given 650 mg of Tylenol while in clinic PO.   UA positive for blood and ketones. Patient is not symptomatic but given he is febrile with an elevated white count will treat with Cipro 500 mg twice a day for 7 days. Patient instructed to take antibiotic with food and may cause GI upset. Also spoke at length about if he develops abdominal pain, worsening nausea/vomiting and fever, to please call clinic and speak to on call oncologist. Patient and family in agreement  with plan.  Patient denies any  abdominal pain, diarrhea or bloating. Due to elevated total bili at 2.1. Wll add on direct/ indirect bili. Indirect bili elevated at 1.7. Direct bilirubin normal at 0.2. Patient's hemoglobin is stable at 15.5.   Discussed with Dr. Janese Banks, and she recommended patient return next week for repeat labs including total, indirect and direct bilirubin. Patient does not need further workup with 1 single elevated total bili and normal LFTs, hemoglobin and alkaline phosphatase.   Visit Diagnosis 1. Fever, unspecified fever cause   2. Nausea   3. Non-intractable vomiting with nausea, unspecified vomiting type   4. Dehydration     Patient expressed understanding and was in agreement with this plan. He also understands that He can call clinic at any time with any questions, concerns, or complaints.   Greater than 50% was spent in counseling and coordination of care with this patient including but not limited to discussion of the relevant topics above (See A&P) including, but not limited to diagnosis and management of acute and chronic medical conditions.    Faythe Casa, AGNP-C [facility] at Center- [phone] Pager- [phone] 02/18/2018 2:53 PM

## 2018-02-18 NOTE — Telephone Encounter (Signed)
Daughter Raquel Sarna called to report that patient is running fever of 101 last PM and 100 this AM, he has CLL. He has not been eating or drinking as well as vomiting and she feels he is dehydrated. Appointment for this AM accepted for 930

## 2018-02-18 NOTE — Patient Instructions (Signed)
  Place appropriate patient instructions here. 

## 2018-02-19 LAB — URINE CULTURE: Culture: NO GROWTH

## 2018-02-21 ENCOUNTER — Telehealth: Payer: Self-pay | Admitting: Oncology

## 2018-02-21 NOTE — Telephone Encounter (Addendum)
Called to speak to patient and see how he was feeling after his visit on Friday. Patient is feeling "much better". He has tolerated his antibiotic well without significant side effects. He denies any recent fevers. He admits to eating better without nausea and vomiting. His weakness has improved.  Asked patient to call us, if fever returns or if he begins to feel dehydrated again.  Faythe Casa, NP 02/22/2018 3:20 PM

## 2018-02-25 ENCOUNTER — Inpatient Hospital Stay: Payer: Medicare Other

## 2018-02-25 DIAGNOSIS — C9110 Chronic lymphocytic leukemia of B-cell type not having achieved remission: Secondary | ICD-10-CM

## 2018-02-25 DIAGNOSIS — R509 Fever, unspecified: Secondary | ICD-10-CM | POA: Diagnosis not present

## 2018-02-25 DIAGNOSIS — R112 Nausea with vomiting, unspecified: Secondary | ICD-10-CM | POA: Diagnosis not present

## 2018-02-25 DIAGNOSIS — R600 Localized edema: Secondary | ICD-10-CM | POA: Diagnosis not present

## 2018-02-25 DIAGNOSIS — E860 Dehydration: Secondary | ICD-10-CM | POA: Diagnosis not present

## 2018-02-25 DIAGNOSIS — D696 Thrombocytopenia, unspecified: Secondary | ICD-10-CM | POA: Diagnosis not present

## 2018-02-25 LAB — CBC WITH DIFFERENTIAL/PLATELET
Basophils Absolute: 0 10*3/uL (ref 0–0.1)
Basophils Relative: 1 %
EOS ABS: 0.2 10*3/uL (ref 0–0.7)
Eosinophils Relative: 2 %
HCT: 48.3 % (ref 40.0–52.0)
HEMOGLOBIN: 16.5 g/dL (ref 13.0–18.0)
LYMPHS ABS: 1.7 10*3/uL (ref 1.0–3.6)
LYMPHS PCT: 19 %
MCH: 31 pg (ref 26.0–34.0)
MCHC: 34.1 g/dL (ref 32.0–36.0)
MCV: 90.8 fL (ref 80.0–100.0)
MONOS PCT: 10 %
Monocytes Absolute: 0.9 10*3/uL (ref 0.2–1.0)
Neutro Abs: 6.1 10*3/uL (ref 1.4–6.5)
Neutrophils Relative %: 68 %
Platelets: 150 10*3/uL (ref 150–440)
RBC: 5.32 MIL/uL (ref 4.40–5.90)
RDW: 15.7 % — ABNORMAL HIGH (ref 11.5–14.5)
WBC: 8.8 10*3/uL (ref 3.8–10.6)

## 2018-02-25 LAB — COMPREHENSIVE METABOLIC PANEL
ALT: 14 U/L — AB (ref 17–63)
AST: 20 U/L (ref 15–41)
Albumin: 3.9 g/dL (ref 3.5–5.0)
Alkaline Phosphatase: 31 U/L — ABNORMAL LOW (ref 38–126)
Anion gap: 8 (ref 5–15)
BUN: 16 mg/dL (ref 6–20)
CHLORIDE: 102 mmol/L (ref 101–111)
CO2: 27 mmol/L (ref 22–32)
CREATININE: 1.18 mg/dL (ref 0.61–1.24)
Calcium: 9 mg/dL (ref 8.9–10.3)
GFR calc Af Amer: >60 mL/min (ref >60)
GFR, EST NON AFRICAN AMERICAN: 54 mL/min — AB (ref >60)
Glucose, Bld: 103 mg/dL — ABNORMAL HIGH (ref 65–99)
POTASSIUM: 3.6 mmol/L (ref 3.5–5.1)
SODIUM: 137 mmol/L (ref 135–145)
Total Bilirubin: 1.2 mg/dL (ref 0.3–1.2)
Total Protein: 6.5 g/dL (ref 6.5–8.1)

## 2018-02-25 LAB — LACTATE DEHYDROGENASE: LDH: 154 U/L (ref 98–192)

## 2018-03-04 ENCOUNTER — Ambulatory Visit
Admission: RE | Admit: 2018-03-04 | Discharge: 2018-03-04 | Disposition: A | Discharge status: Home / Self Care | Payer: Medicare Other | Source: Ambulatory Visit | Attending: Internal Medicine | Admitting: Internal Medicine

## 2018-03-04 ENCOUNTER — Other Ambulatory Visit: Payer: Self-pay | Admitting: Internal Medicine

## 2018-03-04 DIAGNOSIS — C9110 Chronic lymphocytic leukemia of B-cell type not having achieved remission: Secondary | ICD-10-CM

## 2018-03-04 DIAGNOSIS — I700 Atherosclerosis of aorta: Secondary | ICD-10-CM | POA: Diagnosis not present

## 2018-03-04 DIAGNOSIS — I2510 Atherosclerotic heart disease of native coronary artery without angina pectoris: Secondary | ICD-10-CM | POA: Diagnosis not present

## 2018-03-04 DIAGNOSIS — N400 Enlarged prostate without lower urinary tract symptoms: Secondary | ICD-10-CM | POA: Diagnosis not present

## 2018-03-04 DIAGNOSIS — Q268 Other congenital malformations of great veins: Secondary | ICD-10-CM | POA: Insufficient documentation

## 2018-03-04 DIAGNOSIS — K5730 Diverticulosis of large intestine without perforation or abscess without bleeding: Secondary | ICD-10-CM | POA: Insufficient documentation

## 2018-03-04 DIAGNOSIS — C9190 Lymphoid leukemia, unspecified not having achieved remission: Secondary | ICD-10-CM | POA: Insufficient documentation

## 2018-03-04 DIAGNOSIS — R161 Splenomegaly, not elsewhere classified: Secondary | ICD-10-CM | POA: Diagnosis not present

## 2018-03-04 DIAGNOSIS — I701 Atherosclerosis of renal artery: Secondary | ICD-10-CM | POA: Diagnosis not present

## 2018-03-04 MED ORDER — IOPAMIDOL (ISOVUE-370) INJECTION 76%
80.0000 mL | Freq: Once | INTRAVENOUS | Status: AC | PRN
  Administered 2018-03-04: 80 mL via INTRAVENOUS

## 2018-03-08 ENCOUNTER — Other Ambulatory Visit: Payer: Self-pay

## 2018-03-08 ENCOUNTER — Inpatient Hospital Stay (HOSPITAL_BASED_OUTPATIENT_CLINIC_OR_DEPARTMENT_OTHER): Payer: Medicare Other | Admitting: Internal Medicine

## 2018-03-08 ENCOUNTER — Telehealth: Payer: Self-pay | Admitting: Internal Medicine

## 2018-03-08 ENCOUNTER — Inpatient Hospital Stay: Payer: Medicare Other | Attending: Internal Medicine

## 2018-03-08 DIAGNOSIS — D696 Thrombocytopenia, unspecified: Secondary | ICD-10-CM | POA: Insufficient documentation

## 2018-03-08 DIAGNOSIS — Z7982 Long term (current) use of aspirin: Secondary | ICD-10-CM | POA: Insufficient documentation

## 2018-03-08 DIAGNOSIS — R161 Splenomegaly, not elsewhere classified: Secondary | ICD-10-CM | POA: Diagnosis not present

## 2018-03-08 DIAGNOSIS — I129 Hypertensive chronic kidney disease with stage 1 through stage 4 chronic kidney disease, or unspecified chronic kidney disease: Secondary | ICD-10-CM | POA: Insufficient documentation

## 2018-03-08 DIAGNOSIS — Z79899 Other long term (current) drug therapy: Secondary | ICD-10-CM

## 2018-03-08 DIAGNOSIS — E7800 Pure hypercholesterolemia, unspecified: Secondary | ICD-10-CM

## 2018-03-08 DIAGNOSIS — I4891 Unspecified atrial fibrillation: Secondary | ICD-10-CM | POA: Diagnosis not present

## 2018-03-08 DIAGNOSIS — N183 Chronic kidney disease, stage 3 (moderate): Secondary | ICD-10-CM | POA: Diagnosis not present

## 2018-03-08 DIAGNOSIS — C9110 Chronic lymphocytic leukemia of B-cell type not having achieved remission: Secondary | ICD-10-CM | POA: Diagnosis not present

## 2018-03-08 LAB — LACTATE DEHYDROGENASE: LDH: 149 U/L (ref 98–192)

## 2018-03-08 LAB — CBC WITH DIFFERENTIAL/PLATELET
Basophils Absolute: 0.1 10*3/uL (ref 0–0.1)
Basophils Relative: 1 %
Eosinophils Absolute: 0.1 10*3/uL (ref 0–0.7)
Eosinophils Relative: 1 %
HEMATOCRIT: 47.6 % (ref 40.0–52.0)
HEMOGLOBIN: 16 g/dL (ref 13.0–18.0)
LYMPHS PCT: 21 %
Lymphs Abs: 1.6 10*3/uL (ref 1.0–3.6)
MCH: 30.5 pg (ref 26.0–34.0)
MCHC: 33.6 g/dL (ref 32.0–36.0)
MCV: 90.7 fL (ref 80.0–100.0)
MONOS PCT: 8 %
Monocytes Absolute: 0.6 10*3/uL (ref 0.2–1.0)
NEUTROS ABS: 5.3 10*3/uL (ref 1.4–6.5)
NEUTROS PCT: 69 %
Platelets: 144 10*3/uL — ABNORMAL LOW (ref 150–440)
RBC: 5.25 MIL/uL (ref 4.40–5.90)
RDW: 15 % — ABNORMAL HIGH (ref 11.5–14.5)
WBC: 7.6 10*3/uL (ref 3.8–10.6)

## 2018-03-08 LAB — COMPREHENSIVE METABOLIC PANEL
ALK PHOS: 32 U/L — AB (ref 38–126)
ALT: 14 U/L — ABNORMAL LOW (ref 17–63)
ANION GAP: 9 (ref 5–15)
AST: 22 U/L (ref 15–41)
Albumin: 3.9 g/dL (ref 3.5–5.0)
BUN: 17 mg/dL (ref 6–20)
CO2: 24 mmol/L (ref 22–32)
Calcium: 9 mg/dL (ref 8.9–10.3)
Chloride: 104 mmol/L (ref 101–111)
Creatinine, Ser: 1.09 mg/dL (ref 0.61–1.24)
GFR calc Af Amer: >60 mL/min (ref >60)
GFR calc non Af Amer: 60 mL/min — ABNORMAL LOW (ref >60)
GLUCOSE: 109 mg/dL — AB (ref 65–99)
Potassium: 3.7 mmol/L (ref 3.5–5.1)
SODIUM: 137 mmol/L (ref 135–145)
Total Bilirubin: 1.5 mg/dL — ABNORMAL HIGH (ref 0.3–1.2)
Total Protein: 6.2 g/dL — ABNORMAL LOW (ref 6.5–8.1)

## 2018-03-08 NOTE — Progress Notes (Signed)
[facility] OFFICE PROGRESS NOTE  Patient Care Team: System, Provider Not In as PCP - General Cammie Sickle, MD as Consulting Physician (Internal Medicine)  Cancer Staging No matching staging information was found for the patient.   Oncology History   3 Lambda positive B-cell CLL, RAI stage 0, lymphocytosis only. 10/08/06 CT of chest, abdomen and pelvis - No evidence of splenomegaly. No evidence of lymphadenopathy in the chest, abdomen, or pelvis. 10/04/06 flow cytometry of peripheral blood - A monoclonal population of lambda positive B-cells was detected. Phenotype consistent with diagnosis of B cell chronic lymphocytic leukemia/small lymphocytic lymphoma. CD38 positive. 03/06/08 CT Chest abdomen Pelvis:No findings to suggest recurrent disease. No adenopathy. 06/04/09: CT chest/abdomen/pelvis: No evidence of lymphadenopathy/splenomegaly. 06/04/10: ct chest/abdomen/pelvis: no evidence of active malignancy within the thorax or  abdomen or pelvis.  # CLL- 13q del; July 2017 [facility] ordered.   # Sep 27th CT scan- LN+ splenomegaly ------------------------------------------------- --  # OCT 3rd 2018- Gazyva [No chlorambucil sec to insurance issues]; finished March 2019-; May 2019- CT-PR.  # Prostate Biopsy [PSA ~5; Dr.Wolfe]; June 2017; Enlarged prostate- s/p ? cryo Dr.Wolfe.   # CKD [creat 1.5]; a.fib/ sicksinus syndrome s/p ppm [[facility]];      CLL (chronic lymphocytic leukemia) ([place])      INTERVAL HISTORY:  Brent Hoover 84 y.o.  male pleasant patient above history of CLL-status post Gazyva 6 infusions finished March 2019 needed for follow-up/reviewed the results of the CT scan.  Patient was recently evaluated in the symptom management clinic-for symptoms of UTI status post antibiotics/IV fluids symptoms resolved.  No fevers no chills.  No back pain.  No nausea no vomiting.  Energy levels are adequate.  Denies any unusual chest pain or shortness of breath or cough.   Denies any lumps or bumps.  Review of Systems  Constitutional: Negative for chills, diaphoresis, fever, malaise/fatigue and weight loss.  HENT: Negative for nosebleeds and sore throat.   Eyes: Negative for double vision.  Respiratory: Negative for cough, hemoptysis, sputum production, shortness of breath and wheezing.   Cardiovascular: Negative for chest pain, palpitations, orthopnea and leg swelling.  Gastrointestinal: Negative for abdominal pain, blood in stool, constipation, diarrhea, heartburn, melena, nausea and vomiting.  Genitourinary: Negative for dysuria, frequency and urgency.  Musculoskeletal: Negative for back pain and joint pain.  Skin: Negative.  Negative for itching and rash.  Neurological: Negative for dizziness, tingling, focal weakness, weakness and headaches.  Endo/Heme/Allergies: Does not bruise/bleed easily.  Psychiatric/Behavioral: Negative for depression. The patient is not nervous/anxious and does not have insomnia.       PAST MEDICAL HISTORY :  Past Medical History:  Diagnosis Date  . Bladder neck obstruction   . Cataracts, bilateral   . Chronic lymphocytic leukemia (CLL), B-cell (HCC)    Lambda positive B-cell CLL  . Gout   . Hypercholesterolemia   . Hypertension   . Lymphocytosis   . Sleep apnea   . Thrombocytopenia ([place])     PAST SURGICAL HISTORY :   Past Surgical History:  Procedure Laterality Date  . COLONOSCOPY WITH PROPOFOL N/A 09/10/2015   Procedure: COLONOSCOPY WITH PROPOFOL;  Surgeon: Lucilla Lame, MD;  Location: [facility] ENDOSCOPY;  Service: Endoscopy;  Laterality: N/A;  . PROSTATE SURGERY  2008    FAMILY HISTORY :   Family History  Problem Relation Age of Onset  . Lung cancer Mother 70       deceased  . Brain cancer Son 67  pt's only son  . Polycythemia Brother        half brother  . Prostate cancer Brother     SOCIAL HISTORY:   Social History   Tobacco Use  . Smoking status: Never Smoker  . Smokeless tobacco: Never Used   Substance Use Topics  . Alcohol use: No    Alcohol/week: 0.0 oz  . Drug use: No    ALLERGIES:  is allergic to penicillins.  MEDICATIONS:  Current Outpatient Medications  Medication Sig Dispense Refill  . acyclovir (ZOVIRAX) 400 MG tablet One pill a day [to prevent shingles] 60 tablet 3  . allopurinol (ZYLOPRIM) 100 MG tablet Take 200 mg by mouth daily.     Marland Kitchen amLODipine (NORVASC) 5 MG tablet Take 5 mg by mouth daily.     Marland Kitchen atorvastatin (LIPITOR) 20 MG tablet Take 20 mg by mouth daily. Take 1/2 tablet daily    . lisinopril-hydrochlorothiazide (PRINZIDE,ZESTORETIC) 20-12.5 MG tablet Take 1 tablet by mouth daily.    Marland Kitchen acetaminophen (TYLENOL) 325 MG tablet Take 650 mg by mouth daily.     . ondansetron (ZOFRAN) 8 MG tablet 1 pill every 8 hours as needed for nausea/vomitting (Patient not taking: Reported on 02/18/2018) 40 tablet 1  . prochlorperazine (COMPAZINE) 10 MG tablet Take 1 tablet (10 mg total) by mouth every 6 (six) hours as needed for nausea or vomiting. (Patient not taking: Reported on 02/18/2018) 40 tablet 1  . ranitidine (ZANTAC) 150 MG capsule Take 150 mg by mouth 2 (two) times daily. Start taking 2 days prior to [facility] IV infusion     No current facility-administered medications for this visit.     PHYSICAL EXAMINATION: ECOG PERFORMANCE STATUS: 0 - Asymptomatic  BP (!) 160/89 (BP Location: Left Arm, Patient Position: Sitting)   Pulse 75   Temp (!) 97.4 F (36.3 C)   Resp 18   Wt 231 lb 0.7 oz (104.8 kg)   BMI 36.19 kg/m   Filed Weights   03/08/18 1019  Weight: 231 lb 0.7 oz (104.8 kg)    GENERAL: Well-nourished well-developed; Alert, no distress and comfortable. e/Accompanied by family.  EYES: no pallor or icterus OROPHARYNX: no thrush or ulceration; NECK: supple; no lymph nodes felt. LYMPH:  no palpable lymphadenopathy in the axillary or inguinal regions LUNGS: Decreased breath sounds auscultation bilaterally. No wheeze or crackles HEART/CVS: regular rate &  rhythm and no murmurs; No lower extremity edema ABDOMEN:abdomen soft, non-tender and normal bowel sounds. No hepatomegaly or splenomegaly.  Musculoskeletal:no cyanosis of digits and no clubbing  PSYCH: alert & oriented x 3 with fluent speech NEURO: no focal motor/sensory deficits SKIN:  no rashes or significant lesions    LABORATORY DATA:  I have reviewed the data as listed    Component Value Date/Time   NA 137 03/08/2018 0949   NA 137 09/14/2014 0834   K 3.7 03/08/2018 0949   K 4.0 09/14/2014 0834   CL 104 03/08/2018 0949   CL 101 09/14/2014 0834   CO2 24 03/08/2018 0949   CO2 27 09/14/2014 0834   GLUCOSE 109 (H) 03/08/2018 0949   GLUCOSE 123 (H) 09/14/2014 0834   BUN 17 03/08/2018 0949   BUN 14 09/14/2014 0834   CREATININE 1.09 03/08/2018 0949   CREATININE 1.14 02/05/2015 0949   CALCIUM 9.0 03/08/2018 0949   CALCIUM 8.8 09/14/2014 0834   PROT 6.2 (L) 03/08/2018 0949   PROT 6.7 09/14/2014 0834   ALBUMIN 3.9 03/08/2018 0949   ALBUMIN 3.6 09/14/2014 6712  AST 22 03/08/2018 0949   AST 21 09/14/2014 0834   ALT 14 (L) 03/08/2018 0949   ALT 26 09/14/2014 0834   ALKPHOS 32 (L) 03/08/2018 0949   ALKPHOS 49 09/14/2014 0834   BILITOT 1.5 (H) 03/08/2018 0949   BILITOT 1.1 (H) 09/14/2014 0834   GFRNONAA 60 (L) 03/08/2018 0949   GFRNONAA 60 (L) 02/05/2015 0949   GFRAA >60 03/08/2018 0949   GFRAA >60 02/05/2015 0949    No results found for: SPEP, UPEP  Lab Results  Component Value Date   WBC 7.6 03/08/2018   NEUTROABS 5.3 03/08/2018   HGB 16.0 03/08/2018   HCT 47.6 03/08/2018   MCV 90.7 03/08/2018   PLT 144 (L) 03/08/2018      Chemistry      Component Value Date/Time   NA 137 03/08/2018 0949   NA 137 09/14/2014 0834   K 3.7 03/08/2018 0949   K 4.0 09/14/2014 0834   CL 104 03/08/2018 0949   CL 101 09/14/2014 0834   CO2 24 03/08/2018 0949   CO2 27 09/14/2014 0834   BUN 17 03/08/2018 0949   BUN 14 09/14/2014 0834   CREATININE 1.09 03/08/2018 0949   CREATININE  1.14 02/05/2015 0949      Component Value Date/Time   CALCIUM 9.0 03/08/2018 0949   CALCIUM 8.8 09/14/2014 0834   ALKPHOS 32 (L) 03/08/2018 0949   ALKPHOS 49 09/14/2014 0834   AST 22 03/08/2018 0949   AST 21 09/14/2014 0834   ALT 14 (L) 03/08/2018 0949   ALT 26 09/14/2014 0834   BILITOT 1.5 (H) 03/08/2018 0949   BILITOT 1.1 (H) 09/14/2014 0834       RADIOGRAPHIC STUDIES: I have personally reviewed the radiological images as listed and agreed with the findings in the report. No results found.   ASSESSMENT & PLAN:  CLL (chronic lymphocytic leukemia) ([place]) #CLL 13 q. Deletion/IgG VH-pending Oletta Darter 4128].  Status post 6 cycles of Gazyva-finished March 2019.  #Good clinical response/CBC within normal limits except for mild thrombocytopenia platelets 144.  CT scan-significant improvement of lymphadenopathy/splenomegaly.   #Right renal artery narrowing-currently asymptomatic [although chronic elevated blood pressure; mild CKD]-recommend follow-up with cardiology/he was given a copy of the CT scan report.  # Afib/bradycardia s/p PPM at [facility]-on aspirin.  #CKD stage III creatinine 1.3 stable.  #Elevated PSA [status post biopsy negative for cancer]; Dr. Eliberto Ivory PSA improving  #Okay to stop infectious prophylaxis;    #Follow-up in 3 months labs.   # I reviewed the blood work- with the patient in detail; also reviewed the imaging independently [as summarized above]; and with the patient in detail.   # 25 minutes face-to-face with the patient discussing the above plan of care; more than 50% of time spent on prognosis/ natural history; counseling and coordination.     No orders of the defined types were placed in this encounter.  All questions were answered. The patient knows to call the clinic with any problems, questions or concerns.      Cammie Sickle, MD 03/08/2018 12:32 PM

## 2018-03-08 NOTE — Assessment & Plan Note (Addendum)
#  CLL 13 q. Deletion/IgG VH-pending Oletta Darter 7505].  Status post 6 cycles of Gazyva-finished March 2019.  #Good clinical response/CBC within normal limits except for mild thrombocytopenia platelets 144.  CT scan-significant improvement of lymphadenopathy/splenomegaly.   #Right renal artery narrowing-currently asymptomatic [although chronic elevated blood pressure; mild CKD]-recommend follow-up with cardiology/he was given a copy of the CT scan report.  # Afib/bradycardia s/p PPM at [facility]-on aspirin.  #CKD stage III creatinine 1.3 stable.  #Elevated PSA [status post biopsy negative for cancer]; Dr. Eliberto Ivory PSA improving  #Okay to stop infectious prophylaxis;    #Follow-up in 3 months labs.   # I reviewed the blood work- with the patient in detail; also reviewed the imaging independently [as summarized above]; and with the patient in detail.   # 25 minutes face-to-face with the patient discussing the above plan of care; more than 50% of time spent on prognosis/ natural history; counseling and coordination.

## 2018-03-08 NOTE — Telephone Encounter (Signed)
Please get me the results of I GVH mutation [ordered 06/15/2017]. Thx

## 2018-03-08 NOTE — Progress Notes (Signed)
Here for follow up . Per pt 2 weeks ago went to symptom mgt-given 2 L fluids then ABX for UTI -UTI improved and felt better overall

## 2018-03-25 NOTE — Telephone Encounter (Signed)
Lab results received by [facility] and given to Dr. Jacinto Reap

## 2018-03-31 DIAGNOSIS — Z45018 Encounter for adjustment and management of other part of cardiac pacemaker: Secondary | ICD-10-CM | POA: Diagnosis not present

## 2018-03-31 DIAGNOSIS — R001 Bradycardia, unspecified: Secondary | ICD-10-CM | POA: Diagnosis not present

## 2018-05-11 DIAGNOSIS — N401 Enlarged prostate with lower urinary tract symptoms: Secondary | ICD-10-CM | POA: Diagnosis not present

## 2018-05-11 DIAGNOSIS — Z125 Encounter for screening for malignant neoplasm of prostate: Secondary | ICD-10-CM | POA: Diagnosis not present

## 2018-05-11 DIAGNOSIS — R972 Elevated prostate specific antigen [PSA]: Secondary | ICD-10-CM | POA: Diagnosis not present

## 2018-05-17 DIAGNOSIS — N401 Enlarged prostate with lower urinary tract symptoms: Secondary | ICD-10-CM | POA: Diagnosis not present

## 2018-05-20 ENCOUNTER — Inpatient Hospital Stay
Admission: EM | Admit: 2018-05-20 | Discharge: 2018-05-21 | DRG: 641 | Disposition: A | Discharge status: Home / Self Care | Payer: Medicare Other | Attending: Internal Medicine | Admitting: Internal Medicine

## 2018-05-20 ENCOUNTER — Other Ambulatory Visit: Payer: Self-pay

## 2018-05-20 ENCOUNTER — Emergency Department: Payer: Medicare Other

## 2018-05-20 ENCOUNTER — Telehealth: Payer: Self-pay | Admitting: Oncology

## 2018-05-20 DIAGNOSIS — E860 Dehydration: Secondary | ICD-10-CM | POA: Diagnosis present

## 2018-05-20 DIAGNOSIS — R7989 Other specified abnormal findings of blood chemistry: Secondary | ICD-10-CM

## 2018-05-20 DIAGNOSIS — G4733 Obstructive sleep apnea (adult) (pediatric): Secondary | ICD-10-CM | POA: Diagnosis present

## 2018-05-20 DIAGNOSIS — C9111 Chronic lymphocytic leukemia of B-cell type in remission: Secondary | ICD-10-CM | POA: Diagnosis present

## 2018-05-20 DIAGNOSIS — Z79899 Other long term (current) drug therapy: Secondary | ICD-10-CM

## 2018-05-20 DIAGNOSIS — R531 Weakness: Secondary | ICD-10-CM

## 2018-05-20 DIAGNOSIS — E871 Hypo-osmolality and hyponatremia: Principal | ICD-10-CM | POA: Diagnosis present

## 2018-05-20 DIAGNOSIS — I248 Other forms of acute ischemic heart disease: Secondary | ICD-10-CM | POA: Diagnosis present

## 2018-05-20 DIAGNOSIS — E7800 Pure hypercholesterolemia, unspecified: Secondary | ICD-10-CM | POA: Diagnosis present

## 2018-05-20 DIAGNOSIS — R748 Abnormal levels of other serum enzymes: Secondary | ICD-10-CM | POA: Diagnosis not present

## 2018-05-20 DIAGNOSIS — K219 Gastro-esophageal reflux disease without esophagitis: Secondary | ICD-10-CM | POA: Diagnosis present

## 2018-05-20 DIAGNOSIS — I517 Cardiomegaly: Secondary | ICD-10-CM | POA: Diagnosis not present

## 2018-05-20 DIAGNOSIS — Z880 Allergy status to penicillin: Secondary | ICD-10-CM

## 2018-05-20 DIAGNOSIS — I10 Essential (primary) hypertension: Secondary | ICD-10-CM | POA: Diagnosis present

## 2018-05-20 DIAGNOSIS — M6281 Muscle weakness (generalized): Secondary | ICD-10-CM | POA: Diagnosis not present

## 2018-05-20 DIAGNOSIS — R410 Disorientation, unspecified: Secondary | ICD-10-CM | POA: Diagnosis not present

## 2018-05-20 DIAGNOSIS — C9110 Chronic lymphocytic leukemia of B-cell type not having achieved remission: Secondary | ICD-10-CM | POA: Diagnosis not present

## 2018-05-20 DIAGNOSIS — M109 Gout, unspecified: Secondary | ICD-10-CM | POA: Diagnosis present

## 2018-05-20 DIAGNOSIS — R778 Other specified abnormalities of plasma proteins: Secondary | ICD-10-CM

## 2018-05-20 LAB — CBC WITH DIFFERENTIAL/PLATELET
BASOS ABS: 0 10*3/uL (ref 0–0.1)
BASOS PCT: 1 %
Eosinophils Absolute: 0 10*3/uL (ref 0–0.7)
Eosinophils Relative: 0 %
HEMATOCRIT: 41.2 % (ref 40.0–52.0)
HEMOGLOBIN: 14.3 g/dL (ref 13.0–18.0)
Lymphocytes Relative: 14 %
Lymphs Abs: 1.3 10*3/uL (ref 1.0–3.6)
MCH: 30.8 pg (ref 26.0–34.0)
MCHC: 34.8 g/dL (ref 32.0–36.0)
MCV: 88.3 fL (ref 80.0–100.0)
MONO ABS: 1.1 10*3/uL — AB (ref 0.2–1.0)
Monocytes Relative: 12 %
NEUTROS ABS: 7 10*3/uL — AB (ref 1.4–6.5)
NEUTROS PCT: 73 %
Platelets: 146 10*3/uL — ABNORMAL LOW (ref 150–440)
RBC: 4.66 MIL/uL (ref 4.40–5.90)
RDW: 14.8 % — AB (ref 11.5–14.5)
WBC: 9.5 10*3/uL (ref 3.8–10.6)

## 2018-05-20 LAB — COMPREHENSIVE METABOLIC PANEL
ALBUMIN: 3.7 g/dL (ref 3.5–5.0)
ALT: 12 U/L (ref 0–44)
AST: 19 U/L (ref 15–41)
Alkaline Phosphatase: 32 U/L — ABNORMAL LOW (ref 38–126)
Anion gap: 10 (ref 5–15)
BILIRUBIN TOTAL: 2.7 mg/dL — AB (ref 0.3–1.2)
BUN: 17 mg/dL (ref 8–23)
CO2: 24 mmol/L (ref 22–32)
Calcium: 8.5 mg/dL — ABNORMAL LOW (ref 8.9–10.3)
Chloride: 95 mmol/L — ABNORMAL LOW (ref 98–111)
Creatinine, Ser: 1.14 mg/dL (ref 0.61–1.24)
GFR calc Af Amer: >60 mL/min (ref >60)
GFR calc non Af Amer: 57 mL/min — ABNORMAL LOW (ref >60)
GLUCOSE: 120 mg/dL — AB (ref 70–99)
POTASSIUM: 3.2 mmol/L — AB (ref 3.5–5.1)
Sodium: 129 mmol/L — ABNORMAL LOW (ref 135–145)
TOTAL PROTEIN: 5.7 g/dL — AB (ref 6.5–8.1)

## 2018-05-20 LAB — URINALYSIS, COMPLETE (UACMP) WITH MICROSCOPIC
BACTERIA UA: NONE SEEN
Bilirubin Urine: NEGATIVE
Glucose, UA: NEGATIVE mg/dL
KETONES UR: 20 mg/dL — AB
Nitrite: NEGATIVE
Protein, ur: NEGATIVE mg/dL
SQUAMOUS EPITHELIAL / LPF: NONE SEEN (ref 0–5)
Specific Gravity, Urine: 1.015 (ref 1.005–1.030)
pH: 6 (ref 5.0–8.0)

## 2018-05-20 LAB — TROPONIN I: Troponin I: 0.06 ng/mL (ref <0.03)

## 2018-05-20 MED ORDER — ENOXAPARIN SODIUM 40 MG/0.4ML ~~LOC~~ SOLN
40.0000 mg | SUBCUTANEOUS | Status: DC
  Administered 2018-05-20: 40 mg via SUBCUTANEOUS
  Filled 2018-05-20: qty 0.4

## 2018-05-20 MED ORDER — FAMOTIDINE 20 MG PO TABS
20.0000 mg | ORAL_TABLET | Freq: Two times a day (BID) | ORAL | Status: DC
  Administered 2018-05-20 – 2018-05-21 (×2): 20 mg via ORAL
  Filled 2018-05-20 (×2): qty 1

## 2018-05-20 MED ORDER — AMLODIPINE BESYLATE 5 MG PO TABS
5.0000 mg | ORAL_TABLET | Freq: Every day | ORAL | Status: DC
  Administered 2018-05-21: 11:00:00 5 mg via ORAL
  Filled 2018-05-20: qty 1

## 2018-05-20 MED ORDER — SODIUM CHLORIDE 0.9 % IV SOLN
INTRAVENOUS | Status: DC
  Administered 2018-05-20 – 2018-05-21 (×2): via INTRAVENOUS

## 2018-05-20 MED ORDER — ALLOPURINOL 100 MG PO TABS
200.0000 mg | ORAL_TABLET | Freq: Every day | ORAL | Status: DC
  Administered 2018-05-21: 11:00:00 200 mg via ORAL
  Filled 2018-05-20: qty 2

## 2018-05-20 MED ORDER — SODIUM CHLORIDE 0.9 % IV SOLN
Freq: Once | INTRAVENOUS | Status: AC
Start: 2018-05-20 — End: 2018-05-21
  Administered 2018-05-20: 19:00:00 via INTRAVENOUS

## 2018-05-20 MED ORDER — ASPIRIN 81 MG PO CHEW
324.0000 mg | CHEWABLE_TABLET | Freq: Once | ORAL | Status: AC
Start: 2018-05-20 — End: 2018-05-20
  Administered 2018-05-20: 324 mg via ORAL
  Filled 2018-05-20: qty 4

## 2018-05-20 MED ORDER — ACETAMINOPHEN 650 MG RE SUPP: 650.0000 mg | Freq: Four times a day (QID) | RECTAL | Status: DC | PRN

## 2018-05-20 MED ORDER — ONDANSETRON HCL 4 MG/2ML IJ SOLN: 4.0000 mg | Freq: Four times a day (QID) | INTRAMUSCULAR | Status: DC | PRN

## 2018-05-20 MED ORDER — ACETAMINOPHEN 325 MG PO TABS: 650.0000 mg | ORAL_TABLET | Freq: Four times a day (QID) | ORAL | Status: DC | PRN

## 2018-05-20 MED ORDER — ONDANSETRON HCL 4 MG PO TABS: 4.0000 mg | ORAL_TABLET | Freq: Four times a day (QID) | ORAL | Status: DC | PRN

## 2018-05-20 MED ORDER — ATORVASTATIN CALCIUM 20 MG PO TABS
10.0000 mg | ORAL_TABLET | Freq: Every day | ORAL | Status: DC
  Administered 2018-05-21: 11:00:00 10 mg via ORAL
  Filled 2018-05-20: qty 1

## 2018-05-20 MED ORDER — ACYCLOVIR 400 MG PO TABS
400.0000 mg | ORAL_TABLET | Freq: Every day | ORAL | Status: DC
  Administered 2018-05-21: 11:00:00 400 mg via ORAL
  Filled 2018-05-20: qty 1

## 2018-05-20 NOTE — ED Notes (Signed)
Date and time results received: 05/20/18 6:25 PM  Test: Troponin Critical Value: 0.07 ng/mL  Name of Provider Notified: Dr. Jimmye Norman

## 2018-05-20 NOTE — ED Notes (Signed)
Transport to floor 123.AS

## 2018-05-20 NOTE — H&P (Signed)
[facility] [place] NAME: Brent Hoover    MR#:  [id number]  DATE OF BIRTH:  February 24, 1930  DATE OF ADMISSION:  05/20/2018  PRIMARY CARE PHYSICIAN: System, Provider Not In   REQUESTING/REFERRING PHYSICIAN: Dr. Lenise Arena  CHIEF COMPLAINT:   Chief Complaint  Patient presents with  . Weakness    HISTORY OF PRESENT ILLNESS:  Brent Hoover  is a 84 y.o. male with a known history of CLL, hypertension, hyperlipidemia, obstructive sleep apnea, history of cataracts who presents to the hospital due to generalized weakness, malaise and just not feeling well.  Patient says he developed the symptoms 2 to 3 days ago and have progressively gotten worse.  He denies any documented fever, chills, cough, congestion, chest pains, shortness of breath.  He denies any dysuria hematuria or urinary frequency.  He denies any recent sick contacts.  Patient just says he feels lousy.  He presents to the ER and underwent routine blood work which showed a sodium of 129 and a mildly elevated troponin of 0.07.  Hospitalist services were contacted for admission.  PAST MEDICAL HISTORY:   Past Medical History:  Diagnosis Date  . Bladder neck obstruction   . Cataracts, bilateral   . Chronic lymphocytic leukemia (CLL), B-cell (HCC)    Lambda positive B-cell CLL  . Gout   . Hypercholesterolemia   . Hypertension   . Lymphocytosis   . Sleep apnea   . Thrombocytopenia ([place])     PAST SURGICAL HISTORY:   Past Surgical History:  Procedure Laterality Date  . COLONOSCOPY WITH PROPOFOL N/A 09/10/2015   Procedure: COLONOSCOPY WITH PROPOFOL;  Surgeon: Lucilla Lame, MD;  Location: [facility] ENDOSCOPY;  Service: Endoscopy;  Laterality: N/A;  . PROSTATE SURGERY  2008    SOCIAL HISTORY:   Social History   Tobacco Use  . Smoking status: Never Smoker  . Smokeless tobacco: Never Used  Substance Use Topics  . Alcohol use: No    Alcohol/week: 0.0 oz    FAMILY HISTORY:    Family History  Problem Relation Age of Onset  . Lung cancer Mother 46       deceased  . Brain cancer Son 59       pt's only son  . Polycythemia Brother        half brother  . Prostate cancer Brother     DRUG ALLERGIES:   Allergies  Allergen Reactions  . Penicillins Other (See Comments)    Has patient had a PCN reaction causing immediate rash, facial/tongue/throat swelling, SOB or lightheadedness with hypotension: Unknown Has patient had a PCN reaction causing severe rash involving mucus membranes or skin necrosis: Unknown Has patient had a PCN reaction that required hospitalization: Unknown Has patient had a PCN reaction occurring within the last 10 years: Unknown If all of the above answers are "NO", then may proceed with Cephalosporin use.    REVIEW OF SYSTEMS:   Review of Systems  Constitutional: Negative for fever and weight loss.  HENT: Negative for congestion, nosebleeds and tinnitus.   Eyes: Negative for blurred vision, double vision and redness.  Respiratory: Negative for cough, hemoptysis and shortness of breath.   Cardiovascular: Negative for chest pain, orthopnea, leg swelling and PND.  Gastrointestinal: Negative for abdominal pain, diarrhea, melena, nausea and vomiting.  Genitourinary: Negative for dysuria, hematuria and urgency.  Musculoskeletal: Negative for falls and joint pain.  Neurological: Positive for weakness. Negative for dizziness, tingling, sensory change, focal weakness, seizures and headaches.  Endo/Heme/Allergies: Negative for polydipsia. Does not bruise/bleed easily.  Psychiatric/Behavioral: Negative for depression and memory loss. The patient is not nervous/anxious.     MEDICATIONS AT HOME:   Prior to Admission medications   Medication Sig Start Date End Date Taking? Authorizing Provider  acyclovir (ZOVIRAX) 400 MG tablet One pill a day [to prevent shingles] 07/27/17  Yes Cammie Sickle, MD  allopurinol (ZYLOPRIM) 100 MG tablet Take  200 mg by mouth daily.    Yes [provider]  amLODipine (NORVASC) 5 MG tablet Take 5 mg by mouth daily.    Yes [provider]  atorvastatin (LIPITOR) 20 MG tablet Take 10 mg by mouth daily.    Yes [provider]  lisinopril-hydrochlorothiazide (PRINZIDE,ZESTORETIC) 20-12.5 MG tablet Take 1 tablet by mouth daily.   Yes [provider]  ranitidine (ZANTAC) 150 MG capsule Take 150 mg by mouth 2 (two) times daily. Start taking 2 days prior to [facility] IV infusion   Yes [provider]  acetaminophen (TYLENOL) 325 MG tablet Take 650 mg by mouth daily.     [provider]  ondansetron (ZOFRAN) 8 MG tablet 1 pill every 8 hours as needed for nausea/vomitting Patient not taking: Reported on 02/18/2018 07/27/17   Cammie Sickle, MD  prochlorperazine (COMPAZINE) 10 MG tablet Take 1 tablet (10 mg total) by mouth every 6 (six) hours as needed for nausea or vomiting. Patient not taking: Reported on 02/18/2018 07/27/17   Cammie Sickle, MD      VITAL SIGNS:  Blood pressure 135/79, pulse 64, temperature 98.4 F (36.9 C), temperature source Oral, resp. rate 20, height 5\' 7"  (1.702 m), weight 104.3 kg (230 lb), SpO2 97 %.  PHYSICAL EXAMINATION:  Physical Exam  GENERAL:  84 y.o.-year-old patient lying in the bed in no acute distress.  EYES: Pupils equal, round, reactive to light and accommodation. No scleral icterus. Extraocular muscles intact.  HEENT: Head atraumatic, normocephalic. Oropharynx and nasopharynx clear. No oropharyngeal erythema, moist oral mucosa  NECK:  Supple, no jugular venous distention. No thyroid enlargement, no tenderness.  LUNGS: Normal breath sounds bilaterally, no wheezing, rales, rhonchi. No use of accessory muscles of respiration.  CARDIOVASCULAR: S1, S2 RRR. No murmurs, rubs, gallops, clicks.  ABDOMEN: Soft, nontender, nondistended. Bowel sounds present. No organomegaly or mass.  EXTREMITIES: No pedal edema,  cyanosis, or clubbing. + 2 pedal & radial pulses b/l.   NEUROLOGIC: Cranial nerves II through XII are intact. No focal Motor or sensory deficits appreciated b/l. Globally weak.  PSYCHIATRIC: The patient is alert and oriented x 3. SKIN: No obvious rash, lesion, or ulcer.   LABORATORY PANEL:   CBC Recent Labs  Lab 05/20/18 1721  WBC 9.5  HGB 14.3  HCT 41.2  PLT 146*   ------------------------------------------------------------------------------------------------------------------  Chemistries  Recent Labs  Lab 05/20/18 1721  NA 129*  K 3.2*  CL 95*  CO2 24  GLUCOSE 120*  BUN 17  CREATININE 1.14  CALCIUM 8.5*  AST 19  ALT 12  ALKPHOS 32*  BILITOT 2.7*   ------------------------------------------------------------------------------------------------------------------  Cardiac Enzymes Recent Labs  Lab 05/20/18 1721  TROPONINI 0.07*   ------------------------------------------------------------------------------------------------------------------  RADIOLOGY:  Dg Chest 2 View  Result Date: 05/20/2018 CLINICAL DATA:  Weakness, disorientation and nausea x1 day. EXAM: CHEST - 2 VIEW COMPARISON:  10/05/2017 FINDINGS: New since prior exam is a left-sided pacemaker apparatus with single lead in the right ventricle. Cardiomegaly is stable with slightly uncoiled atherosclerotic aorta. Clear lungs without pulmonary edema or consolidation. No  acute osseous abnormality. Osteoarthritis of the [place] and glenohumeral joints bilaterally. Degenerative changes are present along the dorsal spine. IMPRESSION: Stable cardiomegaly with aortic atherosclerosis. No active pulmonary disease. Electronically Signed   By: Ashley Royalty M.D.   On: 05/20/2018 18:04     IMPRESSION AND PLAN:   84 year old male with past medical history of CLL, hypertension, hyperlipidemia, history of cataracts, history of gout, obstructive sleep apnea who presents to the hospital due to weakness, malaise.  1.   Hyponatremia- secondary to dehydration and poor p.o. intak and also concomitant use of diuretics. -I will hold his hydrochlorothiazide, hydrate him with IV fluids, follow sodium.  2.  Generalized weakness/malaise-secondary to the hyponatremia.  We will give him some IV fluids, get a physical therapy consult to assess his mobility.  Await urinalysis to rule out UTI.  3.  Elevated troponin-secondary to supply demand ischemia.  Patient has no symptoms of acute coronary syndrome. -We will observe on telemetry, cycle his markers.  4.  History of CLL- as per the patient and her his family this is currently in remission.  Patient follows with Dr. Rogue Bussing.  5.  Essential hypertension-continue Norvasc.  6.  History of gout-no acute attack.  Continue allopurinol.  7.  GERD-continue Pepcid.    All the records are reviewed and case discussed with ED provider. Management plans discussed with the patient, family and they are in agreement.  CODE STATUS: Full code  TOTAL TIME TAKING CARE OF THIS PATIENT: 40 minutes.    Henreitta Leber M.D on 05/20/2018 at 7:59 PM  Between 7am to 6pm - Pager - [phone]  After 6pm go to [email or web address] - password [email or web address]  [facility]  Office  [phone]  CC: Primary care physician; System, Provider Not In

## 2018-05-20 NOTE — Telephone Encounter (Signed)
Granddaughter Raquel Sarna called that patient does not feel well for the past 3 days and has already been pick up by ambulance to ER. Would like to inform oncology.  Chart reviewed. History CLL, s/p Gazyva treatment finished in March 2019,with good response.  Discussed with Raquel Sarna that this may or may not be related to CLL. Awaiting ER work up. If needed will see patient. She voices understanding.

## 2018-05-20 NOTE — ED Triage Notes (Addendum)
Pt is coming in by [facility] from home with c/o weakness, disorientation and nausea x 1 day. Hx of pacemaker, leukemia and HTN per EMS. Pt is A&Ox4. EMS vitals: BG 113, Temp 100.0, 97% on RA, BP 136/82, HR 70s. Denies pain. Denies shortness of breath. Also c/o feeling chills.

## 2018-05-20 NOTE — ED Provider Notes (Signed)
[facility] Emergency Department Provider Note       Time seen: ----------------------------------------- 5:11 PM on 05/20/2018 -----------------------------------------   I have reviewed the triage vital signs and the nursing notes.  HISTORY   Chief Complaint Weakness    HPI Brent Hoover is a 84 y.o. male with a history of chronic lymphocytic leukemia, gout, hyperlipidemia, hypertension, thrombocytopenia who presents to the ED for weakness.  Scrubs weakness that started yesterday, he thinks it may be his kidneys although he will not elaborate as to what that means.  He is had some disorientation and nausea according to EMS.  He does live at home.  Prehospital temperature was noted to be elevated, he denies any fevers, chills, chest pain, shortness of breath, vomiting or diarrhea.  Past Medical History:  Diagnosis Date  . Bladder neck obstruction   . Cataracts, bilateral   . Chronic lymphocytic leukemia (CLL), B-cell (HCC)    Lambda positive B-cell CLL  . Gout   . Hypercholesterolemia   . Hypertension   . Lymphocytosis   . Sleep apnea   . Thrombocytopenia [facility])     Patient Active Problem List   Diagnosis Date Noted  . Splenomegaly 07/27/2017  . CLL (chronic lymphocytic leukemia) ([place]) 04/30/2015    Past Surgical History:  Procedure Laterality Date  . COLONOSCOPY WITH PROPOFOL N/A 09/10/2015   Procedure: COLONOSCOPY WITH PROPOFOL;  Surgeon: Lucilla Lame, MD;  Location: [facility] ENDOSCOPY;  Service: Endoscopy;  Laterality: N/A;  . PROSTATE SURGERY  2008    Allergies Penicillins  Social History Social History   Tobacco Use  . Smoking status: Never Smoker  . Smokeless tobacco: Never Used  Substance Use Topics  . Alcohol use: No    Alcohol/week: 0.0 oz  . Drug use: No   Review of Systems Constitutional: Negative for fever. Cardiovascular: Negative for chest pain. Respiratory: Negative for shortness of breath. Gastrointestinal: Negative  for abdominal pain, vomiting and diarrhea. Musculoskeletal: Negative for back pain. Skin: Negative for rash. Neurological: Positive for weakness  All systems negative/normal/unremarkable except as stated in the HPI  ____________________________________________   PHYSICAL EXAM:  VITAL SIGNS: ED Triage Vitals  Enc Vitals Group     BP 05/20/18 1709 132/75     Pulse Rate 05/20/18 1709 65     Resp 05/20/18 1709 20     Temp 05/20/18 1709 98.4 F (36.9 C)     Temp Source 05/20/18 1709 Oral     SpO2 05/20/18 1709 97 %     Weight --      Height --      Head Circumference --      Peak Flow --      Pain Score 05/20/18 1711 0     Pain Loc --      Pain Edu? --      Excl. in [place]? --    Constitutional: Alert and oriented.  Chronically ill-appearing, no distress Eyes: Conjunctivae are normal. Normal extraocular movements. ENT   Head: Normocephalic and atraumatic.   Nose: No congestion/rhinnorhea.   Mouth/Throat: Mucous membranes are moist.   Neck: No stridor. Cardiovascular: Normal rate, regular rhythm. No murmurs, rubs, or gallops. Respiratory: Normal respiratory effort without tachypnea nor retractions. Breath sounds are clear and equal bilaterally. No wheezes/rales/rhonchi. Gastrointestinal: Soft and nontender. Normal bowel sounds Musculoskeletal: Nontender with normal range of motion in extremities. No lower extremity tenderness nor edema. Neurologic:  Normal speech and language. No gross focal neurologic deficits are appreciated.  Skin:  Skin  is warm, dry and intact. No rash noted. Psychiatric: Mood and affect are normal. Speech and behavior are normal.  ____________________________________________  EKG: Interpreted by me.  Ventricular paced rhythm with a rate of 67 bpm, no other focal abnormalities are appreciated  ____________________________________________  ED COURSE:  As part of my medical decision making, I reviewed the following data within the [place] History obtained from family if available, nursing notes, old chart and ekg, as well as notes from prior ED visits. Patient presented for generalized weakness, we will assess with labs and imaging as indicated at this time.   Procedures ____________________________________________   LABS (pertinent positives/negatives)  Labs Reviewed  CBC WITH DIFFERENTIAL/PLATELET - Abnormal; Notable for the following components:      Result Value   RDW 14.8 (*)    Platelets 146 (*)    Neutro Abs 7.0 (*)    Monocytes Absolute 1.1 (*)    All other components within normal limits  COMPREHENSIVE METABOLIC PANEL - Abnormal; Notable for the following components:   Sodium 129 (*)    Potassium 3.2 (*)    Chloride 95 (*)    Glucose, Bld 120 (*)    Calcium 8.5 (*)    Total Protein 5.7 (*)    Alkaline Phosphatase 32 (*)    Total Bilirubin 2.7 (*)    GFR calc non Af Amer 57 (*)    All other components within normal limits  TROPONIN I - Abnormal; Notable for the following components:   Troponin I 0.07 (*)    All other components within normal limits  URINALYSIS, COMPLETE (UACMP) WITH MICROSCOPIC  CBG MONITORING, ED    RADIOLOGY Images were viewed by me  Chest x-ray IMPRESSION: Stable cardiomegaly with aortic atherosclerosis. No active pulmonary disease.  ____________________________________________  DIFFERENTIAL DIAGNOSIS   Dehydration, electrolyte abnormality, occult infection, MI  FINAL ASSESSMENT AND PLAN  Weakness, hyponatremia, elevated troponin   Plan: The patient had presented for generalized weakness. Patient's labs indicate hyponatremia as well as an elevated troponin. Patient's imaging did not reveal any acute process.  He was started on a gentle saline infusion and aspirin for his elevated troponin.  I will discuss with the hospitalist for admission.   Laurence Aly, MD   Note: This note was generated in part or whole with voice recognition  software. Voice recognition is usually quite accurate but there are transcription errors that can and very often do occur. I apologize for any typographical errors that were not detected and corrected.     Earleen Newport, MD 05/20/18 [phone]

## 2018-05-21 DIAGNOSIS — C9111 Chronic lymphocytic leukemia of B-cell type in remission: Secondary | ICD-10-CM | POA: Diagnosis not present

## 2018-05-21 DIAGNOSIS — R531 Weakness: Secondary | ICD-10-CM | POA: Diagnosis not present

## 2018-05-21 DIAGNOSIS — C9110 Chronic lymphocytic leukemia of B-cell type not having achieved remission: Secondary | ICD-10-CM | POA: Diagnosis not present

## 2018-05-21 DIAGNOSIS — E871 Hypo-osmolality and hyponatremia: Secondary | ICD-10-CM | POA: Diagnosis not present

## 2018-05-21 DIAGNOSIS — I10 Essential (primary) hypertension: Secondary | ICD-10-CM | POA: Diagnosis not present

## 2018-05-21 DIAGNOSIS — E860 Dehydration: Secondary | ICD-10-CM | POA: Diagnosis not present

## 2018-05-21 DIAGNOSIS — I248 Other forms of acute ischemic heart disease: Secondary | ICD-10-CM | POA: Diagnosis not present

## 2018-05-21 DIAGNOSIS — K219 Gastro-esophageal reflux disease without esophagitis: Secondary | ICD-10-CM | POA: Diagnosis not present

## 2018-05-21 DIAGNOSIS — R748 Abnormal levels of other serum enzymes: Secondary | ICD-10-CM | POA: Diagnosis not present

## 2018-05-21 DIAGNOSIS — M109 Gout, unspecified: Secondary | ICD-10-CM | POA: Diagnosis not present

## 2018-05-21 LAB — BASIC METABOLIC PANEL
Anion gap: 8 (ref 5–15)
BUN: 17 mg/dL (ref 8–23)
CALCIUM: 7.9 mg/dL — AB (ref 8.9–10.3)
CO2: 25 mmol/L (ref 22–32)
CREATININE: 1.12 mg/dL (ref 0.61–1.24)
Chloride: 101 mmol/L (ref 98–111)
GFR calc Af Amer: >60 mL/min (ref >60)
GFR calc non Af Amer: 58 mL/min — ABNORMAL LOW (ref >60)
GLUCOSE: 101 mg/dL — AB (ref 70–99)
Potassium: 3.6 mmol/L (ref 3.5–5.1)
SODIUM: 134 mmol/L — AB (ref 135–145)

## 2018-05-21 LAB — RESPIRATORY PANEL BY PCR
Adenovirus: NOT DETECTED
BORDETELLA PERTUSSIS-RVPCR: NOT DETECTED
CHLAMYDOPHILA PNEUMONIAE-RVPPCR: NOT DETECTED
CORONAVIRUS HKU1-RVPPCR: NOT DETECTED
CORONAVIRUS OC43-RVPPCR: NOT DETECTED
Coronavirus 229E: NOT DETECTED
Coronavirus NL63: NOT DETECTED
INFLUENZA A-RVPPCR: NOT DETECTED
Influenza B: NOT DETECTED
Metapneumovirus: NOT DETECTED
Mycoplasma pneumoniae: NOT DETECTED
PARAINFLUENZA VIRUS 3-RVPPCR: NOT DETECTED
PARAINFLUENZA VIRUS 4-RVPPCR: NOT DETECTED
Parainfluenza Virus 1: NOT DETECTED
Parainfluenza Virus 2: NOT DETECTED
RESPIRATORY SYNCYTIAL VIRUS-RVPPCR: NOT DETECTED
RHINOVIRUS / ENTEROVIRUS - RVPPCR: NOT DETECTED

## 2018-05-21 LAB — CBC
HCT: 40.4 % (ref 40.0–52.0)
HEMOGLOBIN: 13.9 g/dL (ref 13.0–18.0)
MCH: 30.4 pg (ref 26.0–34.0)
MCHC: 34.5 g/dL (ref 32.0–36.0)
MCV: 88.3 fL (ref 80.0–100.0)
Platelets: 117 10*3/uL — ABNORMAL LOW (ref 150–440)
RBC: 4.57 MIL/uL (ref 4.40–5.90)
RDW: 14.5 % (ref 11.5–14.5)
WBC: 5.5 10*3/uL (ref 3.8–10.6)

## 2018-05-21 LAB — TROPONIN I
Troponin I: 0.06 ng/mL (ref <0.03)
Troponin I: 0.05 ng/mL (ref <0.03)

## 2018-05-21 MED ORDER — LISINOPRIL 20 MG PO TABS: 20.0000 mg | ORAL_TABLET | Freq: Every day | ORAL | 0 refills | Status: DC

## 2018-05-21 NOTE — Plan of Care (Signed)
Pt is d/ced home - states he feels better than when he came in.  Feels more clear headed.  Pt's K+ improved to 3.6 from 3.2 and Na improved to 134 from 129.  He is a pt of the [place].  Troponin is trending down to 0.05 from 0.07.  Family is at the bedside and will be taking him home.  Reviewing d/c instructions and Nurse Tech removed IV.  Home medications returned to him.

## 2018-05-21 NOTE — Progress Notes (Addendum)
Advanced care plan discussed today at 10 am Purpose of the Encounter: CODE STATUS Parties in Attendance:Patient Patient's Decision Capacity:Good Subjective/Patient's story: Presented for weakness Objective/Medical story Has low sodium and dehydration History of CLL Goals of care determination:  [place] care directives and goals of care discussed Patient wants cpr, intubation, ventilator if need arises CODE STATUS: Full code Time spent discussing advanced care planning: 16 minutes

## 2018-05-21 NOTE — Discharge Summary (Signed)
[facility] [place] NAME: Madyx Delfin    MR#:  [id number]  DATE OF BIRTH:  1930/01/27  DATE OF ADMISSION:  05/20/2018 ADMITTING PHYSICIAN: Henreitta Leber, MD  DATE OF DISCHARGE: No discharge date for patient encounter.  PRIMARY CARE PHYSICIAN: System, Provider Not In   ADMISSION DIAGNOSIS:  Hyponatremia [E87.1] Weakness [R53.1] Elevated troponin I level [R74.8] Chronic lymphocytic leukemia Hypertension Gout GERD DISCHARGE DIAGNOSIS:  Active Problems:   Hyponatremia Elevated troponin secondary to demand ischemia Chronic lymphocytic leukemia Hypertension Gout  SECONDARY DIAGNOSIS:   Past Medical History:  Diagnosis Date  . Bladder neck obstruction   . Cataracts, bilateral   . Chronic lymphocytic leukemia (CLL), B-cell (HCC)    Lambda positive B-cell CLL  . Gout   . Hypercholesterolemia   . Hypertension   . Lymphocytosis   . Sleep apnea   . Thrombocytopenia ([place])      ADMITTING HISTORY Kalei Mckillop  is a 84 y.o. male with a known history of CLL, hypertension, hyperlipidemia, obstructive sleep apnea, history of cataracts who presents to the hospital due to generalized weakness, malaise and just not feeling well.  Patient says he developed the symptoms 2 to 3 days ago and have progressively gotten worse.  He denies any documented fever, chills, cough, congestion, chest pains, shortness of breath.  He denies any dysuria hematuria or urinary frequency.  He denies any recent sick contacts.  Patient just says he feels lousy.  He presents to the ER and underwent routine blood work which showed a sodium of 129 and a mildly elevated troponin of 0.07.  Hospitalist services were contacted for admission  HOSPITAL COURSE:  Patient was admitted to medical floor and hydrated with normal saline .  Troponins were cycled and patient had mildly elevated troponin secondary to demand ischemia.  His hyponatremia resolved with IV fluid hydration.   Received physical therapy.  Patient will be discharged home follow-up with oncology as outpatient.  Patient hemodynamically stable and will be discharged home.  CONSULTS OBTAINED:    DRUG ALLERGIES:   Allergies  Allergen Reactions  . Penicillins Other (See Comments)    Has patient had a PCN reaction causing immediate rash, facial/tongue/throat swelling, SOB or lightheadedness with hypotension: Unknown Has patient had a PCN reaction causing severe rash involving mucus membranes or skin necrosis: Unknown Has patient had a PCN reaction that required hospitalization: Unknown Has patient had a PCN reaction occurring within the last 10 years: Unknown If all of the above answers are "NO", then may proceed with Cephalosporin use.    DISCHARGE MEDICATIONS:   Allergies as of 05/21/2018      Reactions   Penicillins Other (See Comments)   Has patient had a PCN reaction causing immediate rash, facial/tongue/throat swelling, SOB or lightheadedness with hypotension: Unknown Has patient had a PCN reaction causing severe rash involving mucus membranes or skin necrosis: Unknown Has patient had a PCN reaction that required hospitalization: Unknown Has patient had a PCN reaction occurring within the last 10 years: Unknown If all of the above answers are "NO", then may proceed with Cephalosporin use.      Medication List    STOP taking these medications   lisinopril-hydrochlorothiazide 20-12.5 MG tablet Commonly known as:  PRINZIDE,ZESTORETIC   ondansetron 8 MG tablet Commonly known as:  ZOFRAN   prochlorperazine 10 MG tablet Commonly known as:  COMPAZINE     TAKE these medications   acyclovir 400 MG tablet Commonly known as:  ZOVIRAX One pill a day [to prevent shingles]   allopurinol 100 MG tablet Commonly known as:  ZYLOPRIM Take 200 mg by mouth daily.   amLODipine 5 MG tablet Commonly known as:  NORVASC Take 5 mg by mouth daily.   atorvastatin 20 MG tablet Commonly known as:   LIPITOR Take 10 mg by mouth daily.   lisinopril 20 MG tablet Commonly known as:  PRINIVIL,ZESTRIL Take 1 tablet (20 mg total) by mouth daily.   ranitidine 150 MG capsule Commonly known as:  ZANTAC Take 150 mg by mouth 2 (two) times daily. Start taking 2 days prior to [facility] IV infusion   TYLENOL 325 MG tablet Generic drug:  acetaminophen Take 650 mg by mouth daily.       Today  Patient seen and evaluated on the day of discharge Tolerating diet well No fever and chills No Chest pain and shortness of breath  VITAL SIGNS:  Blood pressure 114/79, pulse 62, temperature 98.2 F (36.8 C), temperature source Oral, resp. rate 20, height 5\' 7"  (1.702 m), weight 104.3 kg (230 lb), SpO2 98 %.  I/O:    Intake/Output Summary (Last 24 hours) at 05/21/2018 1200 Last data filed at 05/21/2018 0929 Gross per 24 hour  Intake 545 ml  Output 1850 ml  Net -1305 ml    PHYSICAL EXAMINATION:  Physical Exam  GENERAL:  84 y.o.-year-old patient lying in the bed with no acute distress.  LUNGS: Normal breath sounds bilaterally, no wheezing, rales,rhonchi or crepitation. No use of accessory muscles of respiration.  CARDIOVASCULAR: S1, S2 normal. No murmurs, rubs, or gallops.  ABDOMEN: Soft, non-tender, non-distended. Bowel sounds present. No organomegaly or mass.  NEUROLOGIC: Moves all 4 extremities. PSYCHIATRIC: The patient is alert and oriented x 3.  SKIN: No obvious rash, lesion, or ulcer.   DATA REVIEW:   CBC Recent Labs  Lab 05/21/18 0338  WBC 5.5  HGB 13.9  HCT 40.4  PLT 117*    Chemistries  Recent Labs  Lab 05/20/18 1721 05/21/18 0338  NA 129* 134*  K 3.2* 3.6  CL 95* 101  CO2 24 25  GLUCOSE 120* 101*  BUN 17 17  CREATININE 1.14 1.12  CALCIUM 8.5* 7.9*  AST 19  --   ALT 12  --   ALKPHOS 32*  --   BILITOT 2.7*  --     Cardiac Enzymes Recent Labs  Lab 05/21/18 0646  TROPONINI 0.05*    Microbiology Results  Results for orders placed or performed during the  hospital encounter of 05/20/18  Respiratory Panel by PCR     Status: None   Collection Time: 05/20/18 10:30 PM  Result Value Ref Range Status   Adenovirus NOT DETECTED NOT DETECTED Final   Coronavirus 229E NOT DETECTED NOT DETECTED Final   Coronavirus HKU1 NOT DETECTED NOT DETECTED Final   Coronavirus NL63 NOT DETECTED NOT DETECTED Final   Coronavirus OC43 NOT DETECTED NOT DETECTED Final   Metapneumovirus NOT DETECTED NOT DETECTED Final   Rhinovirus / Enterovirus NOT DETECTED NOT DETECTED Final   Influenza A NOT DETECTED NOT DETECTED Final   Influenza B NOT DETECTED NOT DETECTED Final   Parainfluenza Virus 1 NOT DETECTED NOT DETECTED Final   Parainfluenza Virus 2 NOT DETECTED NOT DETECTED Final   Parainfluenza Virus 3 NOT DETECTED NOT DETECTED Final   Parainfluenza Virus 4 NOT DETECTED NOT DETECTED Final   Respiratory Syncytial Virus NOT DETECTED NOT DETECTED Final   Bordetella pertussis NOT DETECTED NOT DETECTED Final  Chlamydophila pneumoniae NOT DETECTED NOT DETECTED Final   Mycoplasma pneumoniae NOT DETECTED NOT DETECTED Final    RADIOLOGY:  Dg Chest 2 View  Result Date: 05/20/2018 CLINICAL DATA:  Weakness, disorientation and nausea x1 day. EXAM: CHEST - 2 VIEW COMPARISON:  10/05/2017 FINDINGS: New since prior exam is a left-sided pacemaker apparatus with single lead in the right ventricle. Cardiomegaly is stable with slightly uncoiled atherosclerotic aorta. Clear lungs without pulmonary edema or consolidation. No acute osseous abnormality. Osteoarthritis of the [facility] and glenohumeral joints bilaterally. Degenerative changes are present along the dorsal spine. IMPRESSION: Stable cardiomegaly with aortic atherosclerosis. No active pulmonary disease. Electronically Signed   By: Ashley Royalty M.D.   On: 05/20/2018 18:04    Follow up with PCP in 1 week.  Management plans discussed with the patient, family and they are in agreement.  CODE STATUS: Full code    Code Status Orders  (From  admission, onward)        Start     Ordered   05/20/18 2116  Full code  Continuous     05/20/18 2115    Code Status History    This patient has a current code status but no historical code status.      TOTAL TIME TAKING CARE OF THIS PATIENT ON DAY OF DISCHARGE: more than 34 minutes.   Saundra Shelling M.D on 05/21/2018 at 12:00 PM  Between 7am to 6pm - Pager - [phone]  After 6pm go to [email or web address] - password [email or web address] [place] [facility]  Office  [phone]  CC: Primary care physician; System, Provider Not In  Note: This dictation was prepared with Dragon dictation along with smaller phrase technology. Any transcriptional errors that result from this process are unintentional.

## 2018-05-21 NOTE — Evaluation (Signed)
Physical Therapy Evaluation Patient Details Name: Brent Hoover MRN: [id number] DOB: 1930/01/28 Today's Date: 05/21/2018   History of Present Illness  84 yo male with onset of hyponatremia and elevated but flat troponin was admitted with feelings of weakness.  Has been a CA patient with CLL currently being treated.  Has PMHx:  cardiomegaly, atherosclerosis, pacemaker, OSA, gout  Clinical Impression  Pt is able to walk with min guard on hallway for safety, demonstrating some difficulty with stairs but with rails can control steps better.  Overall a bit weak, esp with LE mm testing.  Recommend HHPT which pt is likely to refuse, but would prefer he do this.  Has equipment at home and will not use it per pt assessment.  Follow acutely until dc to progress gait and strength, and challenge balance to increase safety and decrease fall risk at home.    Follow Up Recommendations Home health PT;Supervision for mobility/OOB    Equipment Recommendations  None recommended by PT    Recommendations for Other Services       Precautions / Restrictions Precautions Precautions: Fall(telemetry) Restrictions Weight Bearing Restrictions: No      Mobility  Bed Mobility Overal bed mobility: Modified Independent                Transfers Overall transfer level: Modified independent Equipment used: 1 person hand held assist(IV pole)                Ambulation/Gait Ambulation/Gait assistance: Min guard Gait Distance (Feet): 200 Feet Assistive device: 1 person hand held assist;IV Pole Gait Pattern/deviations: Step-to pattern;Step-through pattern;Decreased stride length;Wide base of support;Trunk flexed;Drifts right/left Gait velocity: reduced Gait velocity interpretation: <1.31 ft/sec, indicative of household ambulator General Gait Details: able to control balance but slower pace without the IV pole  Stairs Stairs: Yes Stairs assistance: Min guard Stair Management: One rail  Right;Alternating pattern;Forwards Number of Stairs: 4(up and down) General stair comments: able to control descent fairly well, but does hit with some force  Wheelchair Mobility    Modified Rankin (Stroke Patients Only)       Balance Overall balance assessment: Needs assistance Sitting-balance support: Feet supported Sitting balance-Leahy Scale: Good     Standing balance support: Single extremity supported Standing balance-Leahy Scale: Fair                               Pertinent Vitals/Pain Pain Assessment: No/denies pain    Home Living Family/patient expects to be discharged to:: Private residence Living Arrangements: Spouse/significant other Available Help at Discharge: Family;Available 24 hours/day Type of Home: House Home Access: Stairs to enter Entrance Stairs-Rails: Right;Left;Can reach both Entrance Stairs-Number of Steps: 4 Home Layout: Laundry or work area in basement;Able to live on main level with bedroom/bathroom Home Equipment: Environmental consultant - 2 wheels;Cane - single point;Bedside commode      Prior Function Level of Independence: Independent         Comments: has devices but did not use previously, did outdoor repairs and yardwork     Hand Dominance   Dominant Hand: Right    Extremity/Trunk Assessment   Upper Extremity Assessment Upper Extremity Assessment: Overall WFL for tasks assessed    Lower Extremity Assessment Lower Extremity Assessment: Generalized weakness    Cervical / Trunk Assessment Cervical / Trunk Assessment: Normal  Communication   Communication: No difficulties  Cognition Arousal/Alertness: Awake/alert Behavior During Therapy: WFL for tasks assessed/performed Overall Cognitive Status: Within Functional  Limits for tasks assessed                                        General Comments      Exercises     Assessment/Plan    PT Assessment Patient needs continued PT services  PT Problem List  Decreased strength;Decreased range of motion;Decreased activity tolerance;Decreased balance;Decreased mobility;Decreased coordination;Decreased knowledge of use of DME;Decreased safety awareness;Obesity       PT Treatment Interventions DME instruction;Gait training;Stair training;Therapeutic activities;Functional mobility training;Therapeutic exercise;Balance training;Neuromuscular re-education;Patient/family education    PT Goals (Current goals can be found in the Care Plan section)  Acute Rehab PT Goals Patient Stated Goal: to get home and back to outdoor work PT Goal Formulation: With patient Time For Goal Achievement: 05/28/18 Potential to Achieve Goals: Good    Frequency Min 3X/week   Barriers to discharge Inaccessible home environment has stairs to enter house    Co-evaluation               AM-PAC PT "6 Clicks" Daily Activity  Outcome Measure Difficulty turning over in bed (including adjusting bedclothes, sheets and blankets)?: None Difficulty moving from lying on back to sitting on the side of the bed? : A Little Difficulty sitting down on and standing up from a chair with arms (e.g., wheelchair, bedside commode, etc,.)?: A Little Help needed moving to and from a bed to chair (including a wheelchair)?: A Little Help needed walking in hospital room?: A Little Help needed climbing 3-5 steps with a railing? : A Little 6 Click Score: 19    End of Session Equipment Utilized During Treatment: Gait belt Activity Tolerance: Patient tolerated treatment well Patient left: in bed;with call bell/phone within reach;with bed alarm set;with family/visitor present Nurse Communication: Mobility status PT Visit Diagnosis: Unsteadiness on feet (R26.81);Muscle weakness (generalized) (M62.81);Difficulty in walking, not elsewhere classified (R26.2)    Time: 4818-5631 PT Time Calculation (min) (ACUTE ONLY): 12 min   Charges:   PT Evaluation $PT Eval Low Complexity: 1 Low     PT G  Codes:   PT G-Codes **NOT FOR INPATIENT CLASS** Functional Assessment Tool Used: AM-PAC 6 Clicks Basic Mobility    Ramond Dial 05/21/2018, 11:27 AM  Mee Hives, PT MS Acute Rehab Dept. Number: [place]

## 2018-05-23 LAB — TROPONIN I: Troponin I: 0.07 ng/mL (ref <0.03)

## 2018-05-24 ENCOUNTER — Other Ambulatory Visit: Payer: Self-pay | Admitting: *Deleted

## 2018-05-24 NOTE — Patient Outreach (Addendum)
[facility]) Care Management  05/24/2018  Brent Hoover [birth year] [id number]   EMMI-  General discharge     RED ON EMMI ALERT Day # 1  Date: 05/23/18 Red Alert Reason: know who to call about changes in condition? No    Outreach attempt # 1 successful at the home number listed in EPIC   Patient is able to verify HIPAA [place] Management RN reviewed and addressed red alert with patient  Brent Hoover states the answer to the EMMI question should have been yes but at this time he is only seeing his oncologist Dr Rogue Bussing and his grand Hoover, Brent Hoover is assisting him and his wife to get a new primary MD at the [place] clinic. He does not know the name of the MD at this time. He voiced concern about having primary care MD that is not available 24 hr CM discussed MDs on call services and the 24 hr RN line services   He has an appointment with oncologist Dr Rogue Bussing on 06/07/18 at 0930 after getting 0915 labs  Brent Hoover reports his grand Hoover works for the city and his wife is 45 and with some limitations. He mentioned needing someone to mop floors, sweep and vacuum. His wife stated she could do that but Cm discussed personal care services when Brent Hoover reported having "long term care policy" He voiced interest in personal care services and ask Cm to speak with his grand Hoover.  He wrote down Cm mobile number to give to his grand Hoover for a return call hopefully soon He lives with his wife and reports since his d/che has remained active "I got so much to do" He denies need for assist with or assist with changes for advance directives  He denies concerns with taking medications as prescribed, affording medications, side effects of medications and questions about medications He reports he got all his medications listed on after summary sheet and is taking as prescribed    Conditions: hyponatremia, HTN, GERD, gout, chronic lymphocytic leukemia (Lambda positive B  cell CLL) , bilateral cataracts, bladder next obstruction, sleep apnea, thrombocytopenia, hypercholesterolemia,    Advised patient that there will be further automated EMMI- post discharge calls to assess how the patient is doing following the recent hospitalization Advised the patient that another call may be received from a nurse if any of their responses were abnormal. Patient voiced understanding and was appreciative of f/u call.  Plan: [facility] RN CM will pend this case for 2 days pending a return call from Brent Hoover to review [facility] services with her per his request for possible need of [facility] community RN CM and/or SW Plus to discuss 24 hr RN [employer] L. Lavina Hamman, RN, BSN, CCM [facility] Telephonic Care Management Care Coordinator Direct number [phone]  Main [facility] number [phone] Fax number [phone]

## 2018-05-24 NOTE — Patient Outreach (Signed)
[facility]) Care Management  05/24/2018  JOSEH SJOGREN April 02, 1930 [id number]   EMMI-              General discharge                                           RED ON EMMI ALERT Day # 1  Date: 05/23/18 Red Alert Reason: know who to call about changes in condition? No    [facility] RN CM received a return call from Ripley Fraise daughter She is able to verify HIPAA [place] Management RN reviewed and addressed red alertwith patient  She is able to confirm that Mr Dorn has an new primary care appointment with the [facility] clinic in October 2019 and does not need 24 hr nurse line number and she has received assist to get home health in to the home on next week She denies that Mr Reister is in the need of services from [facility] Community/Telephonic RN CM, pharmacy or SW at this time but will contact CM prn  She reports that Mr Alicea sees other specialists other than Dr Rogue Bussing and will see a provider 1-2 times a month  He has an appointment with oncologist Dr Rogue Bussing on 06/07/18 at 29 after getting 0915 labs  She denies need for assist with or assist with changes for advance directives  She denies concerns with taking medications as prescribed, affording medications, side effects of medications and questions about medications. 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  Plan: [facility] RN CM will close case at this  time as patient has been assessed and no needs identified.    Kahlani Graber L. Lavina Hamman, RN, BSN, CCM [facility] Telephonic Care Management Care Coordinator Direct number [phone]  Main [facility] number [phone] Fax number [phone]

## 2018-05-25 DIAGNOSIS — N401 Enlarged prostate with lower urinary tract symptoms: Secondary | ICD-10-CM | POA: Diagnosis not present

## 2018-05-25 DIAGNOSIS — R972 Elevated prostate specific antigen [PSA]: Secondary | ICD-10-CM | POA: Diagnosis not present

## 2018-05-26 ENCOUNTER — Ambulatory Visit: Payer: Medicare Other | Admitting: *Deleted

## 2018-05-27 ENCOUNTER — Other Ambulatory Visit: Payer: Self-pay

## 2018-05-27 NOTE — Patient Outreach (Signed)
[facility]) Care Management  05/27/2018  Brent Hoover 18-Apr-1930 [id number]  EMMI: general discharge Referral date: 05/27/18 Referral reason: Knows who to call about changes in condition: NO,  Lost interest in things: YES.  Insurance: Medicare Day # 4  Telephone call to patient regarding EMMI general discharge red alert. HIPAA verified with patient. Explained reason for call. Patient states he knows to follow up with his cancer doctor if he is having any problems.  Patient states his cancer doctor, Dr. Patrecia Pace has been the primary doctor that he sees. Patient states his next follow up visit is on 06/07/18.   Patient states he is also seen at the [place].   Patient states he is scheduled to see a new primary MD at [facility] in August 2019.   Patient reports his doctor changed his blood pressure medication. Patient states he is taking the new medication without difficulty.  Patient reports he is checking his blood pressure at least 1-2/ times per week. States his most recent blood pressure was in the 140's/ 73.  Patient states he is doing fine. States he is able to cut the grass and go to the grocery store. Patient denies any further needs or concerns.  RNCM advised patient to notify MD of any changes in condition prior to scheduled appointment. RNCM provided contact name and number: [phone] or main office number [phone] and 24 hour nurse advise line [phone]  Requested by mail from patient.  RNCM verified patient aware of 911 services for urgent/ emergent needs.Marland Kitchen  PLAN: RNCM will close patient due to patient being assessed and having no further needs.  RNCM will send patient Know before you go sheet RNCM will send patients cancer doctor closure notification   Quinn Plowman RN,BSN,CCM [facility] Telephonic  [phone]

## 2018-06-06 ENCOUNTER — Other Ambulatory Visit: Payer: Self-pay | Admitting: Internal Medicine

## 2018-06-06 DIAGNOSIS — C9110 Chronic lymphocytic leukemia of B-cell type not having achieved remission: Secondary | ICD-10-CM

## 2018-06-07 ENCOUNTER — Inpatient Hospital Stay: Payer: Medicare Other | Attending: Internal Medicine | Admitting: Internal Medicine

## 2018-06-07 ENCOUNTER — Encounter: Payer: Self-pay | Admitting: Internal Medicine

## 2018-06-07 ENCOUNTER — Inpatient Hospital Stay: Payer: Medicare Other

## 2018-06-07 DIAGNOSIS — Z9221 Personal history of antineoplastic chemotherapy: Secondary | ICD-10-CM | POA: Diagnosis not present

## 2018-06-07 DIAGNOSIS — I129 Hypertensive chronic kidney disease with stage 1 through stage 4 chronic kidney disease, or unspecified chronic kidney disease: Secondary | ICD-10-CM

## 2018-06-07 DIAGNOSIS — C9110 Chronic lymphocytic leukemia of B-cell type not having achieved remission: Secondary | ICD-10-CM

## 2018-06-07 DIAGNOSIS — Z79899 Other long term (current) drug therapy: Secondary | ICD-10-CM

## 2018-06-07 DIAGNOSIS — I4891 Unspecified atrial fibrillation: Secondary | ICD-10-CM | POA: Diagnosis not present

## 2018-06-07 DIAGNOSIS — Z7982 Long term (current) use of aspirin: Secondary | ICD-10-CM

## 2018-06-07 DIAGNOSIS — N183 Chronic kidney disease, stage 3 (moderate): Secondary | ICD-10-CM | POA: Insufficient documentation

## 2018-06-07 LAB — CBC WITH DIFFERENTIAL/PLATELET
BASOS PCT: 1 %
Basophils Absolute: 0.1 10*3/uL (ref 0–0.1)
Eosinophils Absolute: 0.3 10*3/uL (ref 0–0.7)
Eosinophils Relative: 4 %
HEMATOCRIT: 48.1 % (ref 40.0–52.0)
Hemoglobin: 15.7 g/dL (ref 13.0–18.0)
Lymphocytes Relative: 19 %
Lymphs Abs: 1.4 10*3/uL (ref 1.0–3.6)
MCH: 29.6 pg (ref 26.0–34.0)
MCHC: 32.7 g/dL (ref 32.0–36.0)
MCV: 90.6 fL (ref 80.0–100.0)
Monocytes Absolute: 0.7 10*3/uL (ref 0.2–1.0)
Monocytes Relative: 9 %
NEUTROS ABS: 5.1 10*3/uL (ref 1.4–6.5)
Neutrophils Relative %: 67 %
Platelets: 170 10*3/uL (ref 150–440)
RBC: 5.31 MIL/uL (ref 4.40–5.90)
RDW: 15 % — ABNORMAL HIGH (ref 11.5–14.5)
WBC: 7.5 10*3/uL (ref 3.8–10.6)

## 2018-06-07 LAB — COMPREHENSIVE METABOLIC PANEL
ALK PHOS: 42 U/L (ref 38–126)
ALT: 13 U/L (ref 0–44)
ANION GAP: 11 (ref 5–15)
AST: 20 U/L (ref 15–41)
Albumin: 4.2 g/dL (ref 3.5–5.0)
BUN: 12 mg/dL (ref 8–23)
CALCIUM: 9.2 mg/dL (ref 8.9–10.3)
CO2: 25 mmol/L (ref 22–32)
Chloride: 102 mmol/L (ref 98–111)
Creatinine, Ser: 1.3 mg/dL — ABNORMAL HIGH (ref 0.61–1.24)
GFR calc Af Amer: 56 mL/min — ABNORMAL LOW (ref >60)
GFR calc non Af Amer: 48 mL/min — ABNORMAL LOW (ref >60)
Glucose, Bld: 104 mg/dL — ABNORMAL HIGH (ref 70–99)
Potassium: 4.3 mmol/L (ref 3.5–5.1)
SODIUM: 138 mmol/L (ref 135–145)
Total Bilirubin: 1.6 mg/dL — ABNORMAL HIGH (ref 0.3–1.2)
Total Protein: 6.6 g/dL (ref 6.5–8.1)

## 2018-06-07 LAB — LACTATE DEHYDROGENASE: LDH: 147 U/L (ref 98–192)

## 2018-06-07 MED ORDER — LISINOPRIL 20 MG PO TABS: 20.0000 mg | ORAL_TABLET | Freq: Every day | ORAL | 2 refills | Status: DC

## 2018-06-07 NOTE — Progress Notes (Signed)
[place] OFFICE PROGRESS NOTE  Patient Care Team: System, Provider Not In as PCP - General Cammie Sickle, MD as Consulting Physician (Internal Medicine)  Cancer Staging No matching staging information was found for the patient.   Oncology History   3 Lambda positive B-cell CLL, RAI stage 0, lymphocytosis only. 10/08/06 CT of chest, abdomen and pelvis - No evidence of splenomegaly. No evidence of lymphadenopathy in the chest, abdomen, or pelvis. 10/04/06 flow cytometry of peripheral blood - A monoclonal population of lambda positive B-cells was detected. Phenotype consistent with diagnosis of B cell chronic lymphocytic leukemia/small lymphocytic lymphoma. CD38 positive. 03/06/08 CT Chest abdomen Pelvis:No findings to suggest recurrent disease. No adenopathy. 06/04/09: CT chest/abdomen/pelvis: No evidence of lymphadenopathy/splenomegaly. 06/04/10: ct chest/abdomen/pelvis: no evidence of active malignancy within the thorax or  abdomen or pelvis.  # CLL- 13q del; July 2017 [facility]  # Sep 27th CT scan- LN+ splenomegaly ------------------------------------------------- --  # OCT 3rd 2018- Gazyva [No chlorambucil sec to insurance issues]; finished March 2019-; May 2019- CT-PR.  # Prostate Biopsy [PSA ~5; Dr.Wolfe]; June 2017; Enlarged prostate- s/p ? cryo Dr.Wolfe.   # CKD [creat 1.5]; a.fib/ sicksinus syndrome s/p ppm [[facility]];  -------------------------------------------------------------------------------------   DIAGNOSIS: [ ]  CLL  STAGE:  IV       ;GOALS: control  CURRENT/MOST RECENT THERAPY [ ]  GAZYVA [finished may 2019]; surveillaince      CLL (chronic lymphocytic leukemia) ([place])      INTERVAL HISTORY:  Brent Hoover 84 y.o.  male pleasant patient above history of CLL currently on surveillance is here for follow-up.  Patient admits to good appetite.  No nausea no vomiting.  No hospitalizations.  No fevers no chills.  Review of Systems   Constitutional: Negative for chills, diaphoresis, fever, malaise/fatigue and weight loss.  HENT: Negative for nosebleeds and sore throat.   Eyes: Negative for double vision.  Respiratory: Negative for cough, hemoptysis, sputum production, shortness of breath and wheezing.   Cardiovascular: Negative for chest pain, palpitations, orthopnea and leg swelling.  Gastrointestinal: Negative for abdominal pain, blood in stool, constipation, diarrhea, heartburn, melena, nausea and vomiting.  Genitourinary: Negative for dysuria, frequency and urgency.  Musculoskeletal: Negative for back pain and joint pain.  Skin: Negative.  Negative for itching and rash.  Neurological: Negative for dizziness, tingling, focal weakness, weakness and headaches.  Endo/Heme/Allergies: Does not bruise/bleed easily.  Psychiatric/Behavioral: Negative for depression. The patient is not nervous/anxious and does not have insomnia.       PAST MEDICAL HISTORY :  Past Medical History:  Diagnosis Date  . Bladder neck obstruction   . Cataracts, bilateral   . Chronic lymphocytic leukemia (CLL), B-cell (HCC)    Lambda positive B-cell CLL  . Gout   . Hypercholesterolemia   . Hypertension   . Lymphocytosis   . Sleep apnea   . Thrombocytopenia ([place])     PAST SURGICAL HISTORY :   Past Surgical History:  Procedure Laterality Date  . COLONOSCOPY WITH PROPOFOL N/A 09/10/2015   Procedure: COLONOSCOPY WITH PROPOFOL;  Surgeon: Lucilla Lame, MD;  Location: [facility] ENDOSCOPY;  Service: Endoscopy;  Laterality: N/A;  . PROSTATE SURGERY  2008    FAMILY HISTORY :   Family History  Problem Relation Age of Onset  . Lung cancer Mother 67       deceased  . Brain cancer Son 42       pt's only son  . Polycythemia Brother        half brother  .  Prostate cancer Brother     SOCIAL HISTORY:   Social History   Tobacco Use  . Smoking status: Never Smoker  . Smokeless tobacco: Never Used  Substance Use Topics  . Alcohol use: No     Alcohol/week: 0.0 standard drinks  . Drug use: No    ALLERGIES:  is allergic to penicillins.  MEDICATIONS:  Current Outpatient Medications  Medication Sig Dispense Refill  . acetaminophen (TYLENOL) 325 MG tablet Take 650 mg by mouth daily.     Marland Kitchen acyclovir (ZOVIRAX) 400 MG tablet One pill a day [to prevent shingles] 60 tablet 3  . allopurinol (ZYLOPRIM) 100 MG tablet Take 200 mg by mouth daily.     Marland Kitchen amLODipine (NORVASC) 5 MG tablet Take 5 mg by mouth daily.     Marland Kitchen atorvastatin (LIPITOR) 20 MG tablet Take 10 mg by mouth daily.     Marland Kitchen lisinopril (PRINIVIL,ZESTRIL) 20 MG tablet Take 1 tablet (20 mg total) by mouth daily. 90 tablet 2  . ranitidine (ZANTAC) 150 MG capsule Take 150 mg by mouth 2 (two) times daily. Start taking 2 days prior to [facility] IV infusion     No current facility-administered medications for this visit.     PHYSICAL EXAMINATION: ECOG PERFORMANCE STATUS: 0 - Asymptomatic  BP (!) 143/82 (BP Location: Left Arm, Patient Position: Sitting)   Pulse 76   Temp 97.8 F (36.6 C) (Tympanic)   Resp 16   Wt 229 lb 4.5 oz (104 kg)   BMI 35.91 kg/m   Filed Weights   06/07/18 0920  Weight: 229 lb 4.5 oz (104 kg)    GENERAL: Well-nourished well-developed; Alert, no distress and comfortable.  Accompanied by family.  EYES: no pallor or icterus OROPHARYNX: no thrush or ulceration; NECK: supple; no lymph nodes felt. LYMPH:  no palpable lymphadenopathy in the axillary or inguinal regions LUNGS: Decreased breath sounds auscultation bilaterally. No wheeze or crackles HEART/CVS: regular rate & rhythm and no murmurs; No lower extremity edema ABDOMEN:abdomen soft, non-tender and normal bowel sounds. No hepatomegaly or splenomegaly.  Musculoskeletal:no cyanosis of digits and no clubbing  PSYCH: alert & oriented x 3 with fluent speech NEURO: no focal motor/sensory deficits SKIN:  no rashes or significant lesions    LABORATORY DATA:  I have reviewed the data as listed     Component Value Date/Time   NA 138 06/07/2018 0905   NA 137 09/14/2014 0834   K 4.3 06/07/2018 0905   K 4.0 09/14/2014 0834   CL 102 06/07/2018 0905   CL 101 09/14/2014 0834   CO2 25 06/07/2018 0905   CO2 27 09/14/2014 0834   GLUCOSE 104 (H) 06/07/2018 0905   GLUCOSE 123 (H) 09/14/2014 0834   BUN 12 06/07/2018 0905   BUN 14 09/14/2014 0834   CREATININE 1.30 (H) 06/07/2018 0905   CREATININE 1.14 02/05/2015 0949   CALCIUM 9.2 06/07/2018 0905   CALCIUM 8.8 09/14/2014 0834   PROT 6.6 06/07/2018 0905   PROT 6.7 09/14/2014 0834   ALBUMIN 4.2 06/07/2018 0905   ALBUMIN 3.6 09/14/2014 0834   AST 20 06/07/2018 0905   AST 21 09/14/2014 0834   ALT 13 06/07/2018 0905   ALT 26 09/14/2014 0834   ALKPHOS 42 06/07/2018 0905   ALKPHOS 49 09/14/2014 0834   BILITOT 1.6 (H) 06/07/2018 0905   BILITOT 1.1 (H) 09/14/2014 0834   GFRNONAA 48 (L) 06/07/2018 0905   GFRNONAA 60 (L) 02/05/2015 0949   GFRAA 56 (L) 06/07/2018 0905   GFRAA >60  02/05/2015 0949    No results found for: SPEP, UPEP  Lab Results  Component Value Date   WBC 7.5 06/07/2018   NEUTROABS 5.1 06/07/2018   HGB 15.7 06/07/2018   HCT 48.1 06/07/2018   MCV 90.6 06/07/2018   PLT 170 06/07/2018      Chemistry      Component Value Date/Time   NA 138 06/07/2018 0905   NA 137 09/14/2014 0834   K 4.3 06/07/2018 0905   K 4.0 09/14/2014 0834   CL 102 06/07/2018 0905   CL 101 09/14/2014 0834   CO2 25 06/07/2018 0905   CO2 27 09/14/2014 0834   BUN 12 06/07/2018 0905   BUN 14 09/14/2014 0834   CREATININE 1.30 (H) 06/07/2018 0905   CREATININE 1.14 02/05/2015 0949      Component Value Date/Time   CALCIUM 9.2 06/07/2018 0905   CALCIUM 8.8 09/14/2014 0834   ALKPHOS 42 06/07/2018 0905   ALKPHOS 49 09/14/2014 0834   AST 20 06/07/2018 0905   AST 21 09/14/2014 0834   ALT 13 06/07/2018 0905   ALT 26 09/14/2014 0834   BILITOT 1.6 (H) 06/07/2018 0905   BILITOT 1.1 (H) 09/14/2014 0834       RADIOGRAPHIC STUDIES: I have  personally reviewed the radiological images as listed and agreed with the findings in the report. No results found.   ASSESSMENT & PLAN:  CLL (chronic lymphocytic leukemia) ([place]) #CLL 13 q. Deletion; Status post 6 cycles of Gazyva-finished March 2019.  Stable.  Continue surveillance.  # Afib/bradycardia s/p PPM at [facility]-on aspirin.  #Hypertension-stable continue lisinopril.  New prescription given.  #CKD stage III creatinine 1.3; STABLE; refilled lisinopril  #Elevated PSA [status post biopsy negative for cancer]; Dr. Eliberto Ivory PSA improving  #Zoster prophylaxis/TLS prophylaxis-we will discontinue.  #Follow-up in 4 months labs.   # 25 minutes face-to-face with the patient discussing the above plan of care; more than 50% of time spent on prognosis/ natural history; counseling and coordination.     No orders of the defined types were placed in this encounter.  All questions were answered. The patient knows to call the clinic with any problems, questions or concerns.      Cammie Sickle, MD 06/09/2018 6:01 PM

## 2018-06-07 NOTE — Assessment & Plan Note (Addendum)
#  CLL 13 q. Deletion; Status post 6 cycles of Gazyva-finished March 2019.  Stable.  Continue surveillance.  # Afib/bradycardia s/p PPM at [facility]-on aspirin.  #Hypertension-stable continue lisinopril.  New prescription given.  #CKD stage III creatinine 1.3; STABLE; refilled lisinopril  #Elevated PSA [status post biopsy negative for cancer]; Dr. Eliberto Ivory PSA improving  #Zoster prophylaxis/TLS prophylaxis-we will discontinue.  #Follow-up in 4 months labs.   # 25 minutes face-to-face with the patient discussing the above plan of care; more than 50% of time spent on prognosis/ natural history; counseling and coordination.

## 2018-06-30 DIAGNOSIS — Z45018 Encounter for adjustment and management of other part of cardiac pacemaker: Secondary | ICD-10-CM | POA: Diagnosis not present

## 2018-06-30 DIAGNOSIS — R001 Bradycardia, unspecified: Secondary | ICD-10-CM | POA: Diagnosis not present

## 2018-09-02 DIAGNOSIS — L0231 Cutaneous abscess of buttock: Secondary | ICD-10-CM | POA: Diagnosis not present

## 2018-09-02 DIAGNOSIS — A46 Erysipelas: Secondary | ICD-10-CM | POA: Diagnosis not present

## 2018-09-20 DIAGNOSIS — L0231 Cutaneous abscess of buttock: Secondary | ICD-10-CM | POA: Diagnosis not present

## 2018-09-27 ENCOUNTER — Other Ambulatory Visit: Payer: Self-pay | Admitting: *Deleted

## 2018-09-27 DIAGNOSIS — C9110 Chronic lymphocytic leukemia of B-cell type not having achieved remission: Secondary | ICD-10-CM

## 2018-09-28 ENCOUNTER — Other Ambulatory Visit: Payer: Self-pay

## 2018-09-29 DIAGNOSIS — I4891 Unspecified atrial fibrillation: Secondary | ICD-10-CM | POA: Diagnosis not present

## 2018-09-29 DIAGNOSIS — Z45018 Encounter for adjustment and management of other part of cardiac pacemaker: Secondary | ICD-10-CM | POA: Diagnosis not present

## 2018-10-04 ENCOUNTER — Inpatient Hospital Stay: Payer: Medicare Other

## 2018-10-04 ENCOUNTER — Inpatient Hospital Stay: Payer: Medicare Other | Attending: Internal Medicine | Admitting: Internal Medicine

## 2018-10-04 VITALS — BP 164/99 | HR 67 | Temp 97.6°F | Resp 16 | Wt 232.5 lb

## 2018-10-04 DIAGNOSIS — N400 Enlarged prostate without lower urinary tract symptoms: Secondary | ICD-10-CM | POA: Insufficient documentation

## 2018-10-04 DIAGNOSIS — Z79899 Other long term (current) drug therapy: Secondary | ICD-10-CM | POA: Insufficient documentation

## 2018-10-04 DIAGNOSIS — I10 Essential (primary) hypertension: Secondary | ICD-10-CM | POA: Insufficient documentation

## 2018-10-04 DIAGNOSIS — C9110 Chronic lymphocytic leukemia of B-cell type not having achieved remission: Secondary | ICD-10-CM | POA: Diagnosis not present

## 2018-10-04 DIAGNOSIS — Z23 Encounter for immunization: Secondary | ICD-10-CM

## 2018-10-04 DIAGNOSIS — N183 Chronic kidney disease, stage 3 (moderate): Secondary | ICD-10-CM | POA: Diagnosis not present

## 2018-10-04 DIAGNOSIS — I4891 Unspecified atrial fibrillation: Secondary | ICD-10-CM | POA: Diagnosis not present

## 2018-10-04 LAB — COMPREHENSIVE METABOLIC PANEL
ALBUMIN: 4.2 g/dL (ref 3.5–5.0)
ALT: 16 U/L (ref 0–44)
AST: 22 U/L (ref 15–41)
Alkaline Phosphatase: 41 U/L (ref 38–126)
Anion gap: 6 (ref 5–15)
BUN: 13 mg/dL (ref 8–23)
CHLORIDE: 106 mmol/L (ref 98–111)
CO2: 27 mmol/L (ref 22–32)
Calcium: 8.9 mg/dL (ref 8.9–10.3)
Creatinine, Ser: 1.08 mg/dL (ref 0.61–1.24)
GFR calc Af Amer: >60 mL/min (ref >60)
Glucose, Bld: 109 mg/dL — ABNORMAL HIGH (ref 70–99)
POTASSIUM: 4.4 mmol/L (ref 3.5–5.1)
Sodium: 139 mmol/L (ref 135–145)
TOTAL PROTEIN: 6.2 g/dL — AB (ref 6.5–8.1)
Total Bilirubin: 1.4 mg/dL — ABNORMAL HIGH (ref 0.3–1.2)

## 2018-10-04 LAB — CBC WITH DIFFERENTIAL/PLATELET
ABS IMMATURE GRANULOCYTES: 0.02 10*3/uL (ref 0.00–0.07)
BASOS ABS: 0 10*3/uL (ref 0.0–0.1)
BASOS PCT: 1 %
Eosinophils Absolute: 0.1 10*3/uL (ref 0.0–0.5)
Eosinophils Relative: 2 %
HCT: 47 % (ref 39.0–52.0)
HEMOGLOBIN: 15.8 g/dL (ref 13.0–17.0)
IMMATURE GRANULOCYTES: 0 %
Lymphocytes Relative: 25 %
Lymphs Abs: 1.6 10*3/uL (ref 0.7–4.0)
MCH: 29.9 pg (ref 26.0–34.0)
MCHC: 33.6 g/dL (ref 30.0–36.0)
MCV: 88.8 fL (ref 80.0–100.0)
MONO ABS: 0.5 10*3/uL (ref 0.1–1.0)
Monocytes Relative: 7 %
NEUTROS ABS: 4.3 10*3/uL (ref 1.7–7.7)
NEUTROS PCT: 65 %
Platelets: 138 10*3/uL — ABNORMAL LOW (ref 150–400)
RBC: 5.29 MIL/uL (ref 4.22–5.81)
RDW: 14.1 % (ref 11.5–15.5)
WBC: 6.6 10*3/uL (ref 4.0–10.5)
nRBC: 0 % (ref 0.0–0.2)

## 2018-10-04 LAB — LACTATE DEHYDROGENASE: LDH: 151 U/L (ref 98–192)

## 2018-10-04 MED ORDER — INFLUENZA VAC SPLIT HIGH-DOSE 0.5 ML IM SUSY
0.5000 mL | PREFILLED_SYRINGE | Freq: Once | INTRAMUSCULAR | Status: AC
  Administered 2018-10-04: 0.5 mL via INTRAMUSCULAR

## 2018-10-04 NOTE — Patient Instructions (Signed)
#  Stop acyclovir/allopurinol after you finish your medications that you currently have.

## 2018-10-04 NOTE — Patient Instructions (Signed)

## 2018-10-04 NOTE — Assessment & Plan Note (Addendum)
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  White count 6 hemoglobin 13; platelets 139.  Clinically stable.  No evidence of progression.  Continue surveillance.  # Afib/bradycardia s/p PPM at [facility]-on aspirin.  Stable  #Hypertension-stable continue lisinopril.  Stable  #CKD stage III creatinine 1.1; stable.    #DISPOSITION: # flu shot today.  # Follow-up in 4 months labs-Dr.Corcoran- cbc/cmp/ldh.   # 25 minutes face-to-face with the patient discussing the above plan of care; more than 50% of time spent on prognosis/ natural history; counseling and coordination.

## 2018-10-04 NOTE — Progress Notes (Signed)
[facility] OFFICE PROGRESS NOTE  Patient Care Team: System, Provider Not In as PCP - General Cammie Sickle, MD as Consulting Physician (Internal Medicine)  Cancer Staging No matching staging information was found for the patient.   Oncology History   3 Lambda positive B-cell CLL, RAI stage 0, lymphocytosis only. 10/08/06 CT of chest, abdomen and pelvis - No evidence of splenomegaly. No evidence of lymphadenopathy in the chest, abdomen, or pelvis. 10/04/06 flow cytometry of peripheral blood - A monoclonal population of lambda positive B-cells was detected. Phenotype consistent with diagnosis of B cell chronic lymphocytic leukemia/small lymphocytic lymphoma. CD38 positive. 03/06/08 CT Chest abdomen Pelvis:No findings to suggest recurrent disease. No adenopathy. 06/04/09: CT chest/abdomen/pelvis: No evidence of lymphadenopathy/splenomegaly. 06/04/10: ct chest/abdomen/pelvis: no evidence of active malignancy within the thorax or  abdomen or pelvis.  # CLL- 13q del; July 2017 [facility]  # Sep 27th CT scan- LN+ splenomegaly ------------------------------------------------- --  # OCT 3rd 2018- Gazyva [No chlorambucil sec to insurance issues]; finished March 2019-; May 2019- CT-PR.  # Prostate Biopsy [PSA ~5; Dr.Wolfe]; June 2017; Enlarged prostate- s/p ? cryo Dr.Wolfe.   # CKD [creat 1.5]; a.fib/ sicksinus syndrome s/p ppm [[facility]];  ------------------------------------------------------------------------  DIAGNOSIS: CLL  STAGE:  IV ;GOALS: control  CURRENT/MOST RECENT THERAPY :surveillaince      CLL (chronic lymphocytic leukemia) (HCC)      INTERVAL HISTORY:  Brent Hoover 84 y.o.  male pleasant patient above history of CLL currently on surveillance is here for follow-up.  Patient denies any weight loss.  Denies any lumps or bumps.  Appetite is good.  No nausea no vomiting.  No constipation no fevers or chills.  No recent hospitalizations.  Review of  Systems  Constitutional: Negative for chills, diaphoresis, fever, malaise/fatigue and weight loss.  HENT: Negative for nosebleeds and sore throat.   Eyes: Negative for double vision.  Respiratory: Negative for cough, hemoptysis, sputum production, shortness of breath and wheezing.   Cardiovascular: Negative for chest pain, palpitations, orthopnea and leg swelling.  Gastrointestinal: Negative for abdominal pain, blood in stool, constipation, diarrhea, heartburn, melena, nausea and vomiting.  Genitourinary: Negative for dysuria, frequency and urgency.  Musculoskeletal: Negative for back pain and joint pain.  Skin: Negative.  Negative for itching and rash.  Neurological: Negative for dizziness, tingling, focal weakness, weakness and headaches.  Endo/Heme/Allergies: Does not bruise/bleed easily.  Psychiatric/Behavioral: Negative for depression. The patient is not nervous/anxious and does not have insomnia.       PAST MEDICAL HISTORY :  Past Medical History:  Diagnosis Date  . Bladder neck obstruction   . Cataracts, bilateral   . Chronic lymphocytic leukemia (CLL), B-cell (HCC)    Lambda positive B-cell CLL  . Gout   . Hypercholesterolemia   . Hypertension   . Lymphocytosis   . Sleep apnea   . Thrombocytopenia ([place])     PAST SURGICAL HISTORY :   Past Surgical History:  Procedure Laterality Date  . COLONOSCOPY WITH PROPOFOL N/A 09/10/2015   Procedure: COLONOSCOPY WITH PROPOFOL;  Surgeon: Lucilla Lame, MD;  Location: [facility] ENDOSCOPY;  Service: Endoscopy;  Laterality: N/A;  . PROSTATE SURGERY  2008    FAMILY HISTORY :   Family History  Problem Relation Age of Onset  . Lung cancer Mother 79       deceased  . Brain cancer Son 16       pt's only son  . Polycythemia Brother        half brother  .  Prostate cancer Brother     SOCIAL HISTORY:   Social History   Tobacco Use  . Smoking status: Never Smoker  . Smokeless tobacco: Never Used  Substance Use Topics  . Alcohol use: No     Alcohol/week: 0.0 standard drinks  . Drug use: No    ALLERGIES:  is allergic to penicillins.  MEDICATIONS:  Current Outpatient Medications  Medication Sig Dispense Refill  . acetaminophen (TYLENOL) 325 MG tablet Take 650 mg by mouth daily.     Marland Kitchen acyclovir (ZOVIRAX) 400 MG tablet One pill a day [to prevent shingles] 60 tablet 3  . allopurinol (ZYLOPRIM) 100 MG tablet Take 200 mg by mouth daily.     Marland Kitchen amLODipine (NORVASC) 5 MG tablet Take 5 mg by mouth daily.     Marland Kitchen atorvastatin (LIPITOR) 20 MG tablet Take 10 mg by mouth daily.     Marland Kitchen lisinopril (PRINIVIL,ZESTRIL) 20 MG tablet Take 1 tablet (20 mg total) by mouth daily. 90 tablet 2  . ranitidine (ZANTAC) 150 MG capsule Take 150 mg by mouth 2 (two) times daily. Start taking 2 days prior to [facility] IV infusion     No current facility-administered medications for this visit.     PHYSICAL EXAMINATION: ECOG PERFORMANCE STATUS: 0 - Asymptomatic  BP (!) 164/99 (BP Location: Left Arm, Patient Position: Sitting)   Pulse 67   Temp 97.6 F (36.4 C) (Tympanic)   Resp 16   Wt 232 lb 7.6 oz (105.4 kg)   BMI 36.41 kg/m   Filed Weights   10/04/18 1000  Weight: 232 lb 7.6 oz (105.4 kg)   Physical Exam  Constitutional: He is oriented to person, place, and time and well-developed, well-nourished, and in no distress.  Obese male patient.  Accompanied by his wife.  Is walking by himself.  HENT:  Head: Normocephalic and atraumatic.  Mouth/Throat: Oropharynx is clear and moist. No oropharyngeal exudate.  Eyes: Pupils are equal, round, and reactive to light.  Neck: Normal range of motion. Neck supple.  Cardiovascular: Normal rate and regular rhythm.  Pulmonary/Chest: No respiratory distress. He has no wheezes.  Abdominal: Soft. Bowel sounds are normal. He exhibits no distension and no mass. There is no tenderness. There is no rebound and no guarding.  Musculoskeletal: Normal range of motion. He exhibits no edema or tenderness.  Neurological:  He is alert and oriented to person, place, and time.  Skin: Skin is warm.  Psychiatric: Affect normal.      LABORATORY DATA:  I have reviewed the data as listed    Component Value Date/Time   NA 139 10/04/2018 0940   NA 137 09/14/2014 0834   K 4.4 10/04/2018 0940   K 4.0 09/14/2014 0834   CL 106 10/04/2018 0940   CL 101 09/14/2014 0834   CO2 27 10/04/2018 0940   CO2 27 09/14/2014 0834   GLUCOSE 109 (H) 10/04/2018 0940   GLUCOSE 123 (H) 09/14/2014 0834   BUN 13 10/04/2018 0940   BUN 14 09/14/2014 0834   CREATININE 1.08 10/04/2018 0940   CREATININE 1.14 02/05/2015 0949   CALCIUM 8.9 10/04/2018 0940   CALCIUM 8.8 09/14/2014 0834   PROT 6.2 (L) 10/04/2018 0940   PROT 6.7 09/14/2014 0834   ALBUMIN 4.2 10/04/2018 0940   ALBUMIN 3.6 09/14/2014 0834   AST 22 10/04/2018 0940   AST 21 09/14/2014 0834   ALT 16 10/04/2018 0940   ALT 26 09/14/2014 0834   ALKPHOS 41 10/04/2018 0940   ALKPHOS 49 09/14/2014  0834   BILITOT 1.4 (H) 10/04/2018 0940   BILITOT 1.1 (H) 09/14/2014 0834   GFRNONAA >60 10/04/2018 0940   GFRNONAA 60 (L) 02/05/2015 0949   GFRAA >60 10/04/2018 0940   GFRAA >60 02/05/2015 0949    No results found for: SPEP, UPEP  Lab Results  Component Value Date   WBC 6.6 10/04/2018   NEUTROABS 4.3 10/04/2018   HGB 15.8 10/04/2018   HCT 47.0 10/04/2018   MCV 88.8 10/04/2018   PLT 138 (L) 10/04/2018      Chemistry      Component Value Date/Time   NA 139 10/04/2018 0940   NA 137 09/14/2014 0834   K 4.4 10/04/2018 0940   K 4.0 09/14/2014 0834   CL 106 10/04/2018 0940   CL 101 09/14/2014 0834   CO2 27 10/04/2018 0940   CO2 27 09/14/2014 0834   BUN 13 10/04/2018 0940   BUN 14 09/14/2014 0834   CREATININE 1.08 10/04/2018 0940   CREATININE 1.14 02/05/2015 0949      Component Value Date/Time   CALCIUM 8.9 10/04/2018 0940   CALCIUM 8.8 09/14/2014 0834   ALKPHOS 41 10/04/2018 0940   ALKPHOS 49 09/14/2014 0834   AST 22 10/04/2018 0940   AST 21 09/14/2014 0834    ALT 16 10/04/2018 0940   ALT 26 09/14/2014 0834   BILITOT 1.4 (H) 10/04/2018 0940   BILITOT 1.1 (H) 09/14/2014 0834       RADIOGRAPHIC STUDIES: I have personally reviewed the radiological images as listed and agreed with the findings in the report. No results found.   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  White count 6 hemoglobin 13; platelets 139.  Clinically stable.  No evidence of progression.  Continue surveillance.  # Afib/bradycardia s/p PPM at [facility]-on aspirin.  Stable  #Hypertension-stable continue lisinopril.  Stable  #CKD stage III creatinine 1.1; stable.    #DISPOSITION: # flu shot today.  # Follow-up in 4 months labs-Dr.Corcoran- cbc/cmp/ldh.   # 25 minutes face-to-face with the patient discussing the above plan of care; more than 50% of time spent on prognosis/ natural history; counseling and coordination.     Orders Placed This Encounter  Procedures  . CBC with Differential/Platelet    Standing Status:   Future    Standing Expiration Date:   10/05/2019  . Comprehensive metabolic panel    Standing Status:   Future    Standing Expiration Date:   10/05/2019  . Lactate dehydrogenase    Standing Status:   Future    Standing Expiration Date:   10/05/2019   All questions were answered. The patient knows to call the clinic with any problems, questions or concerns.      Cammie Sickle, MD 10/04/2018 12:48 PM

## 2018-11-04 ENCOUNTER — Ambulatory Visit
Admission: EM | Admit: 2018-11-04 | Discharge: 2018-11-04 | Disposition: A | Discharge status: Home / Self Care | Payer: Medicare Other | Attending: Physician Assistant | Admitting: Physician Assistant

## 2018-11-04 ENCOUNTER — Other Ambulatory Visit: Payer: Self-pay

## 2018-11-04 ENCOUNTER — Ambulatory Visit: Payer: Medicare Other

## 2018-11-04 DIAGNOSIS — Z79899 Other long term (current) drug therapy: Secondary | ICD-10-CM | POA: Diagnosis not present

## 2018-11-04 DIAGNOSIS — Z792 Long term (current) use of antibiotics: Secondary | ICD-10-CM | POA: Diagnosis not present

## 2018-11-04 DIAGNOSIS — J189 Pneumonia, unspecified organism: Secondary | ICD-10-CM

## 2018-11-04 DIAGNOSIS — R05 Cough: Secondary | ICD-10-CM | POA: Insufficient documentation

## 2018-11-04 DIAGNOSIS — I10 Essential (primary) hypertension: Secondary | ICD-10-CM | POA: Insufficient documentation

## 2018-11-04 DIAGNOSIS — Z880 Allergy status to penicillin: Secondary | ICD-10-CM | POA: Insufficient documentation

## 2018-11-04 DIAGNOSIS — R059 Cough, unspecified: Secondary | ICD-10-CM

## 2018-11-04 DIAGNOSIS — E7800 Pure hypercholesterolemia, unspecified: Secondary | ICD-10-CM | POA: Diagnosis not present

## 2018-11-04 DIAGNOSIS — M109 Gout, unspecified: Secondary | ICD-10-CM | POA: Insufficient documentation

## 2018-11-04 LAB — RAPID INFLUENZA A&B ANTIGENS
Influenza A (ARMC): NEGATIVE
Influenza B (ARMC): NEGATIVE

## 2018-11-04 LAB — PHOSPHORUS: Phosphorus: 3.5 mg/dL (ref 2.5–4.6)

## 2018-11-04 LAB — CBC WITH DIFFERENTIAL/PLATELET
Abs Immature Granulocytes: 0.03 10*3/uL (ref 0.00–0.07)
Basophils Absolute: 0.1 10*3/uL (ref 0.0–0.1)
Basophils Relative: 0 %
Eosinophils Absolute: 0.1 10*3/uL (ref 0.0–0.5)
Eosinophils Relative: 1 %
HCT: 48.3 % (ref 39.0–52.0)
HEMOGLOBIN: 16.6 g/dL (ref 13.0–17.0)
Immature Granulocytes: 0 %
Lymphocytes Relative: 12 %
Lymphs Abs: 1.4 10*3/uL (ref 0.7–4.0)
MCH: 29.8 pg (ref 26.0–34.0)
MCHC: 34.4 g/dL (ref 30.0–36.0)
MCV: 86.7 fL (ref 80.0–100.0)
Monocytes Absolute: 1 10*3/uL (ref 0.1–1.0)
Monocytes Relative: 8 %
Neutro Abs: 9.2 10*3/uL — ABNORMAL HIGH (ref 1.7–7.7)
Neutrophils Relative %: 79 %
Platelets: 161 10*3/uL (ref 150–400)
RBC: 5.57 MIL/uL (ref 4.22–5.81)
RDW: 13.9 % (ref 11.5–15.5)
WBC: 11.8 10*3/uL — ABNORMAL HIGH (ref 4.0–10.5)
nRBC: 0 % (ref 0.0–0.2)

## 2018-11-04 LAB — BASIC METABOLIC PANEL
Anion gap: 12 (ref 5–15)
BUN: 15 mg/dL (ref 8–23)
CO2: 22 mmol/L (ref 22–32)
Calcium: 8.6 mg/dL — ABNORMAL LOW (ref 8.9–10.3)
Chloride: 102 mmol/L (ref 98–111)
Creatinine, Ser: 1.17 mg/dL (ref 0.61–1.24)
GFR calc Af Amer: >60 mL/min (ref >60)
GFR calc non Af Amer: 56 mL/min — ABNORMAL LOW (ref >60)
Glucose, Bld: 107 mg/dL — ABNORMAL HIGH (ref 70–99)
Potassium: 3.9 mmol/L (ref 3.5–5.1)
Sodium: 136 mmol/L (ref 135–145)

## 2018-11-04 LAB — MAGNESIUM: Magnesium: 2.2 mg/dL (ref 1.7–2.4)

## 2018-11-04 MED ORDER — LEVOFLOXACIN 500 MG PO TABS: 500.0000 mg | ORAL_TABLET | Freq: Every day | ORAL | 0 refills | Status: DC

## 2018-11-04 NOTE — ED Provider Notes (Signed)
[facility]    CSN: [id number] Arrival date & time: 11/04/18  0845     History   Chief Complaint Chief Complaint  Patient presents with  . Cough    HPI Brent Hoover is a 85 y.o. male.   Patient is a 85 year old male with a past medical history of chronic lymphocytic leukemia, followed by Dr.Brahmanday, status post 6 cycles of chemotherapy that finished in March 2019, gout, high cholesterol, hypertension, sleep apnea with CPAP use at home, and thrombocytopenia.  Patient was seen by oncology in December 2019 with noted good cell line counts and signs of remission with no active cancer or regression.  Patient reports cough times Monday with production of grayish colored phlegm.  States he woke up at 3:00 this morning with his cough is taking Mucinex and Robitussin without any improvement.  He said he woke up Tuesday and Thursday with sweats and states he slept most the day Wednesday in his recliner.  Patient states he has had no appetite but is been drinking lots of water and has been drinking pediatric electrolyte solution.  Patient denies fevers but does report chills and did get his flu shot in December.  Patient denies any chest pain or shortness of breath.     Past Medical History:  Diagnosis Date  . Bladder neck obstruction   . Cataracts, bilateral   . Chronic lymphocytic leukemia (CLL), B-cell (HCC)    Lambda positive B-cell CLL  . Gout   . Hypercholesterolemia   . Hypertension   . Lymphocytosis   . Sleep apnea   . Thrombocytopenia [facility])     Patient Active Problem List   Diagnosis Date Noted  . Hyponatremia 05/20/2018  . Splenomegaly 07/27/2017  . CLL (chronic lymphocytic leukemia) ([place]) 04/30/2015    Past Surgical History:  Procedure Laterality Date  . COLONOSCOPY WITH PROPOFOL N/A 09/10/2015   Procedure: COLONOSCOPY WITH PROPOFOL;  Surgeon: Lucilla Lame, MD;  Location: [facility] ENDOSCOPY;  Service: Endoscopy;  Laterality: N/A;  . PROSTATE SURGERY  2008        Home Medications    Prior to Admission medications   Medication Sig Start Date End Date Taking? Authorizing Provider  acetaminophen (TYLENOL) 325 MG tablet Take 650 mg by mouth daily.    Yes [provider]  amLODipine (NORVASC) 5 MG tablet Take 5 mg by mouth daily.    Yes [provider]  lisinopril (PRINIVIL,ZESTRIL) 20 MG tablet Take 1 tablet (20 mg total) by mouth daily. 06/07/18  Yes Cammie Sickle, MD  ranitidine (ZANTAC) 150 MG capsule Take 150 mg by mouth 2 (two) times daily. Start taking 2 days prior to [place] IV infusion   Yes [provider]  acyclovir (ZOVIRAX) 400 MG tablet One pill a day [to prevent shingles] 07/27/17   Cammie Sickle, MD  allopurinol (ZYLOPRIM) 100 MG tablet Take 200 mg by mouth daily.     [provider]  atorvastatin (LIPITOR) 20 MG tablet Take 10 mg by mouth daily.     [provider]  levofloxacin (LEVAQUIN) 500 MG tablet Take 1 tablet (500 mg total) by mouth daily. 11/04/18   Luvenia Redden, PA-C    Family History Family History  Problem Relation Age of Onset  . Lung cancer Mother 50       deceased  . Brain cancer Son 89       pt's only son  . Polycythemia Brother        half brother  .  Prostate cancer Brother     Social History Social History   Tobacco Use  . Smoking status: Never Smoker  . Smokeless tobacco: Never Used  Substance Use Topics  . Alcohol use: No    Alcohol/week: 0.0 standard drinks  . Drug use: No     Allergies   Penicillins   Review of Systems Review of Systems as noted above in HPI.  Other systems reviewed and found to be negative.   Physical Exam Triage Vital Signs ED Triage Vitals  Enc Vitals Group     BP 11/04/18 0856 (!) 167/75     Pulse Rate 11/04/18 0853 68     Resp --      Temp 11/04/18 0853 97.7 F (36.5 C)     Temp Source 11/04/18 0853 Temporal     SpO2 11/04/18 0853 99 %     Weight 11/04/18 0855 220 lb (99.8 kg)     Height  11/04/18 0855 5\' 7"  (1.702 m)     Head Circumference --      Peak Flow --      Pain Score 11/04/18 0855 0     Pain Loc --      Pain Edu? --      Excl. in [place]? --    No data found.  Updated Vital Signs BP (!) 167/75   Pulse 68   Temp 97.7 F (36.5 C) (Temporal)   Ht 5\' 7"  (1.702 m)   Wt 220 lb (99.8 kg)   SpO2 99%   BMI 34.46 kg/m    Physical Exam Constitutional:      General: He is not in acute distress.    Appearance: He is normal weight.  HENT:     Head: Normocephalic and atraumatic.     Right Ear: A middle ear effusion is present.     Left Ear: A middle ear effusion is present.  Neck:     Musculoskeletal: Normal range of motion and neck supple. No muscular tenderness.  Cardiovascular:     Rate and Rhythm: Normal rate and regular rhythm.  Pulmonary:     Effort: Pulmonary effort is normal.     Breath sounds: Normal breath sounds.     Comments: Rales noted diffusely, but no wheezes or rhonchi.  Positive cough. Musculoskeletal: Normal range of motion.  Lymphadenopathy:     Cervical: No cervical adenopathy.  Skin:    General: Skin is warm and dry.  Neurological:     General: No focal deficit present.     Mental Status: He is alert and oriented to person, place, and time. Mental status is at baseline.     Cranial Nerves: No cranial nerve deficit.      UC Treatments / Results  Labs (all labs ordered are listed, but only abnormal results are displayed) Labs Reviewed  CBC WITH DIFFERENTIAL/PLATELET - Abnormal; Notable for the following components:      Result Value   WBC 11.8 (*)    Neutro Abs 9.2 (*)    All other components within normal limits  BASIC METABOLIC PANEL - Abnormal; Notable for the following components:   Glucose, Bld 107 (*)    Calcium 8.6 (*)    GFR calc non Af Amer 56 (*)    All other components within normal limits  RAPID INFLUENZA A&B ANTIGENS ([facility] ONLY)  MAGNESIUM  PHOSPHORUS    EKG None  Radiology Dg Chest 2 View  Result Date:  11/04/2018 CLINICAL DATA:  85 year old male with a  history of sore throat and productive cough with night sweats EXAM: CHEST - 2 VIEW COMPARISON:  05/20/2018, 10/05/2017 FINDINGS: Cardiomediastinal silhouette unchanged in size and contour. Calcifications of the aortic arch. Unchanged position of left chest wall pacing device with single lead. No pneumothorax or pleural effusion. No confluent airspace disease. Chronic changes unchanged. Degenerative changes of the thoracic spine. IMPRESSION: Negative for acute cardiopulmonary disease. Unchanged single lead cardiac pacing device. Electronically Signed   By: Corrie Mckusick D.O.   On: 11/04/2018 09:32    Procedures Procedures (including critical care time)  Medications Ordered in UC Medications - No data to display  Initial Impression / Assessment and Plan / UC Course  I have reviewed the triage vital signs and the nursing notes.  Pertinent labs & imaging results that were available during my care of the patient were reviewed by me and considered in my medical decision making (see chart for details).    Chest x-ray negative for any acute process.  May be some early opacities developing in the right lower lobe.  Breath sounds coarse in all quadrants.  Concern for a developing pneumonia.  Patient primary oncologist was consulted by phone.  He is in agreement with plan to treat the patient with a course of Levaquin.  He advised he would have the patient follow-up with him next week to see how he was progressing with his symptoms.  Patient given instructions regarding presenting to the ER should he have any worsening shortness of breath, difficulty breathing or any other symptoms.  Final Clinical Impressions(s) / UC Diagnoses   Final diagnoses:  Cough  Community acquired pneumonia, unspecified laterality     Discharge Instructions     -Levaquin: one tablet daily for 10 days -fluids -try Boost breeze or similar if unable to begin eating a bland  diet -Call and follow up with Dr. Rogue Bussing or PCP next week to eval progress -low thresh-hold for going to ER if symptoms worsen    ED Prescriptions    Medication Sig Dispense Auth. Provider   levofloxacin (LEVAQUIN) 500 MG tablet Take 1 tablet (500 mg total) by mouth daily. 10 tablet Luvenia Redden, PA-C     Controlled Substance Prescriptions [place] Controlled Substance Registry consulted? Not Applicable   Luvenia Redden, PA-C 11/04/18 1421

## 2018-11-04 NOTE — ED Triage Notes (Signed)
Started on Monday, endorses chills and no body aches. Claims he has no appetite. S/P Leukemia tx 1 yr ago in December.

## 2018-11-04 NOTE — Discharge Instructions (Addendum)
-  Levaquin: one tablet daily for 10 days -fluids -try Boost breeze or similar if unable to begin eating a bland diet -Call and follow up with Dr. Rogue Bussing or PCP next week to eval progress -low thresh-hold for going to ER if symptoms worsen

## 2018-11-08 DIAGNOSIS — A46 Erysipelas: Secondary | ICD-10-CM | POA: Diagnosis not present

## 2018-11-08 DIAGNOSIS — L720 Epidermal cyst: Secondary | ICD-10-CM | POA: Diagnosis not present

## 2018-11-15 DIAGNOSIS — R972 Elevated prostate specific antigen [PSA]: Secondary | ICD-10-CM | POA: Diagnosis not present

## 2018-11-15 DIAGNOSIS — R350 Frequency of micturition: Secondary | ICD-10-CM | POA: Diagnosis not present

## 2018-11-15 DIAGNOSIS — N401 Enlarged prostate with lower urinary tract symptoms: Secondary | ICD-10-CM | POA: Diagnosis not present

## 2018-11-15 DIAGNOSIS — R351 Nocturia: Secondary | ICD-10-CM | POA: Diagnosis not present

## 2018-12-07 ENCOUNTER — Ambulatory Visit
Admission: EM | Admit: 2018-12-07 | Discharge: 2018-12-07 | Disposition: A | Discharge status: Home / Self Care | Payer: Medicare Other | Attending: Family Medicine | Admitting: Family Medicine

## 2018-12-07 ENCOUNTER — Telehealth: Payer: Self-pay | Admitting: *Deleted

## 2018-12-07 ENCOUNTER — Other Ambulatory Visit: Payer: Self-pay

## 2018-12-07 ENCOUNTER — Encounter: Payer: Self-pay | Admitting: Emergency Medicine

## 2018-12-07 DIAGNOSIS — B9789 Other viral agents as the cause of diseases classified elsewhere: Secondary | ICD-10-CM | POA: Diagnosis not present

## 2018-12-07 DIAGNOSIS — J069 Acute upper respiratory infection, unspecified: Secondary | ICD-10-CM | POA: Diagnosis not present

## 2018-12-07 DIAGNOSIS — R350 Frequency of micturition: Secondary | ICD-10-CM | POA: Insufficient documentation

## 2018-12-07 LAB — URINALYSIS, COMPLETE (UACMP) WITH MICROSCOPIC
Bacteria, UA: NONE SEEN
Bilirubin Urine: NEGATIVE
Glucose, UA: NEGATIVE mg/dL
Ketones, ur: NEGATIVE mg/dL
Nitrite: NEGATIVE
Protein, ur: NEGATIVE mg/dL
RBC / HPF: NONE SEEN RBC/hpf (ref 0–5)
Specific Gravity, Urine: 1.01 (ref 1.005–1.030)
pH: 6.5 (ref 5.0–8.0)

## 2018-12-07 MED ORDER — BENZONATATE 200 MG PO CAPS: 200.0000 mg | ORAL_CAPSULE | Freq: Three times a day (TID) | ORAL | 0 refills | Status: DC | PRN

## 2018-12-07 NOTE — Discharge Instructions (Addendum)
Flonase nasal spray Increase water intake

## 2018-12-07 NOTE — Telephone Encounter (Signed)
Granddaughter called and states that patient is not feeling well, has nonspecific complaints of weakness, dizziness and cold symptoms. She reports that he has decided to go ahead to Urgent Care to be checked out.

## 2018-12-07 NOTE — ED Provider Notes (Signed)
[facility]    CSN: [id number] Arrival date & time: 12/07/18  1025     History   Chief Complaint Chief Complaint  Patient presents with  . Urinary Frequency    HPI Brent Hoover is a 85 y.o. male.   85 yo male with a c/o urinary frequency and incomplete emptying sensation for the last 2 days. States he has BPH and was recently started on a medication by his urologist. Also c/o nasal congestion, mild cough, runny nose.   The history is provided by the patient.    Past Medical History:  Diagnosis Date  . Bladder neck obstruction   . Cataracts, bilateral   . Chronic lymphocytic leukemia (CLL), B-cell (HCC)    Lambda positive B-cell CLL  . Gout   . Hypercholesterolemia   . Hypertension   . Lymphocytosis   . Sleep apnea   . Thrombocytopenia [facility])     Patient Active Problem List   Diagnosis Date Noted  . Hyponatremia 05/20/2018  . Splenomegaly 07/27/2017  . CLL (chronic lymphocytic leukemia) ([place]) 04/30/2015    Past Surgical History:  Procedure Laterality Date  . COLONOSCOPY WITH PROPOFOL N/A 09/10/2015   Procedure: COLONOSCOPY WITH PROPOFOL;  Surgeon: Lucilla Lame, MD;  Location: [facility] ENDOSCOPY;  Service: Endoscopy;  Laterality: N/A;  . PROSTATE SURGERY  2008       Home Medications    Prior to Admission medications   Medication Sig Start Date End Date Taking? Authorizing Provider  lisinopril (PRINIVIL,ZESTRIL) 20 MG tablet Take 1 tablet (20 mg total) by mouth daily. 06/07/18  Yes Cammie Sickle, MD  acetaminophen (TYLENOL) 325 MG tablet Take 650 mg by mouth daily.     [provider]  acyclovir (ZOVIRAX) 400 MG tablet One pill a day [to prevent shingles] 07/27/17   Cammie Sickle, MD  allopurinol (ZYLOPRIM) 100 MG tablet Take 200 mg by mouth daily.     [provider]  amLODipine (NORVASC) 5 MG tablet Take 5 mg by mouth daily.     [provider]  atorvastatin (LIPITOR) 20 MG tablet Take 10 mg by mouth daily.      [provider]  benzonatate (TESSALON) 200 MG capsule Take 1 capsule (200 mg total) by mouth 3 (three) times daily as needed. 12/07/18   Norval Gable, MD  levofloxacin (LEVAQUIN) 500 MG tablet Take 1 tablet (500 mg total) by mouth daily. 11/04/18   Luvenia Redden, PA-C  ranitidine (ZANTAC) 150 MG capsule Take 150 mg by mouth 2 (two) times daily. Start taking 2 days prior to [facility] IV infusion    [provider]    Family History Family History  Problem Relation Age of Onset  . Lung cancer Mother 81       deceased  . Brain cancer Son 59       pt's only son  . Polycythemia Brother        half brother  . Prostate cancer Brother     Social History Social History   Tobacco Use  . Smoking status: Never Smoker  . Smokeless tobacco: Never Used  Substance Use Topics  . Alcohol use: No    Alcohol/week: 0.0 standard drinks  . Drug use: No     Allergies   Penicillins   Review of Systems Review of Systems   Physical Exam Triage Vital Signs ED Triage Vitals  Enc Vitals Group     BP 12/07/18 1050 134/76     Pulse Rate  12/07/18 1050 64     Resp 12/07/18 1050 16     Temp 12/07/18 1050 98.2 F (36.8 C)     Temp Source 12/07/18 1050 Oral     SpO2 12/07/18 1050 98 %     Weight 12/07/18 1047 215 lb (97.5 kg)     Height 12/07/18 1047 5\' 7"  (1.702 m)     Head Circumference --      Peak Flow --      Pain Score 12/07/18 1047 0     Pain Loc --      Pain Edu? --      Excl. in [place]? --    No data found.  Updated Vital Signs BP 134/76 (BP Location: Left Arm)   Pulse 64   Temp 98.2 F (36.8 C) (Oral)   Resp 16   Ht 5\' 7"  (1.702 m)   Wt 97.5 kg   SpO2 98%   BMI 33.67 kg/m   Visual Acuity Right Eye Distance:   Left Eye Distance:   Bilateral Distance:    Right Eye Near:   Left Eye Near:    Bilateral Near:     Physical Exam Vitals signs and nursing note reviewed.  Constitutional:      General: He is not in acute distress.    Appearance: He is  well-developed. He is not toxic-appearing or diaphoretic.  HENT:     Head: Normocephalic and atraumatic.     Right Ear: Tympanic membrane, ear canal and external ear normal.     Left Ear: Tympanic membrane, ear canal and external ear normal.     Nose: Nose normal.     Mouth/Throat:     Pharynx: Uvula midline. No oropharyngeal exudate.     Tonsils: No tonsillar abscesses.  Neck:     Musculoskeletal: Normal range of motion and neck supple.     Thyroid: No thyromegaly.     Trachea: No tracheal deviation.  Cardiovascular:     Rate and Rhythm: Normal rate and regular rhythm.     Heart sounds: Normal heart sounds.  Pulmonary:     Effort: Pulmonary effort is normal. No respiratory distress.     Breath sounds: Normal breath sounds. No stridor. No wheezing, rhonchi or rales.  Chest:     Chest wall: No tenderness.  Abdominal:     General: There is no distension.     Palpations: Abdomen is soft.  Lymphadenopathy:     Cervical: No cervical adenopathy.  Skin:    General: Skin is warm and dry.     Findings: No rash.  Neurological:     Mental Status: He is alert.      UC Treatments / Results  Labs (all labs ordered are listed, but only abnormal results are displayed) Labs Reviewed  URINALYSIS, COMPLETE (UACMP) WITH MICROSCOPIC - Abnormal; Notable for the following components:      Result Value   Color, Urine STRAW (*)    Hgb urine dipstick TRACE (*)    Leukocytes, UA TRACE (*)    All other components within normal limits  URINE CULTURE    EKG None  Radiology No results found.  Procedures Procedures (including critical care time)  Medications Ordered in UC Medications - No data to display  Initial Impression / Assessment and Plan / UC Course  I have reviewed the triage vital signs and the nursing notes.  Pertinent labs & imaging results that were available during my care of the patient were reviewed by me  and considered in my medical decision making (see chart for  details).      Final Clinical Impressions(s) / UC Diagnoses   Final diagnoses:  Urinary frequency  Viral URI with cough     Discharge Instructions     Flonase nasal spray Increase water intake    ED Prescriptions    Medication Sig Dispense Auth. Provider   benzonatate (TESSALON) 200 MG capsule Take 1 capsule (200 mg total) by mouth 3 (three) times daily as needed. 30 capsule Norval Gable, MD     1. Lab results and diagnosis reviewed with patient 2. rx as per orders above; reviewed possible side effects, interactions, risks and benefits  3. Recommend supportive treatment with rest, fluids 4. Follow up with urologist for his BPH 5. Follow-up prn    Controlled Substance Prescriptions [place] Controlled Substance Registry consulted? Not Applicable   Norval Gable, MD 12/07/18 [phone]

## 2018-12-07 NOTE — ED Triage Notes (Signed)
Patient c/o increase in urinary frequency that started on Monday.  Patient reports some dizziness that started this morning. Paitent denies any pain.

## 2018-12-08 LAB — URINE CULTURE
Culture: NO GROWTH
Special Requests: NORMAL

## 2018-12-29 DIAGNOSIS — Z45018 Encounter for adjustment and management of other part of cardiac pacemaker: Secondary | ICD-10-CM | POA: Diagnosis not present

## 2018-12-29 DIAGNOSIS — I4891 Unspecified atrial fibrillation: Secondary | ICD-10-CM | POA: Diagnosis not present

## 2019-02-06 ENCOUNTER — Other Ambulatory Visit: Payer: Medicare Other

## 2019-02-06 ENCOUNTER — Ambulatory Visit: Payer: Medicare Other | Admitting: Hematology and Oncology

## 2019-03-06 ENCOUNTER — Other Ambulatory Visit: Payer: Medicare Other

## 2019-03-06 ENCOUNTER — Ambulatory Visit: Payer: Medicare Other | Admitting: Hematology and Oncology

## 2019-03-29 ENCOUNTER — Telehealth: Payer: Self-pay

## 2019-03-29 NOTE — Telephone Encounter (Signed)
Spoke with the patient to inform him that Dr Mike Gip doesn't refill blood pressure medication. I have informed the patient to call his PCP. He states he don't have one i have referral the patient to Dr Ronnald Ramp office. He was pleased to go to her office. The patient was understanding and agreeable.

## 2019-03-30 ENCOUNTER — Other Ambulatory Visit: Payer: Self-pay

## 2019-03-30 ENCOUNTER — Telehealth: Payer: Self-pay

## 2019-03-30 DIAGNOSIS — Z45018 Encounter for adjustment and management of other part of cardiac pacemaker: Secondary | ICD-10-CM | POA: Diagnosis not present

## 2019-03-30 NOTE — Telephone Encounter (Signed)
I spoke with the patient and he informed me that he has appointment with Dr. Ronnald Ramp on Thursday of next week and he will wait to get his blood pressure medication refilled at that time. I called to inform the patient that Dr Mike Gip states she will give a him refill until he is able to make appointment with a PCP by the next 30 days. The patient states he is currently using his wife blood pressure medication and will keep the schedule appointment. The patient was understanding and agreeable to keep schedule appointment.

## 2019-03-31 ENCOUNTER — Inpatient Hospital Stay
Admission: EM | Admit: 2019-03-31 | Discharge: 2019-04-03 | DRG: 187 | Disposition: A | Discharge status: Home / Self Care | Payer: Medicare Other | Attending: Specialist | Admitting: Specialist

## 2019-03-31 ENCOUNTER — Telehealth: Payer: Self-pay

## 2019-03-31 ENCOUNTER — Other Ambulatory Visit: Payer: Self-pay

## 2019-03-31 ENCOUNTER — Telehealth: Payer: Self-pay | Admitting: Hematology and Oncology

## 2019-03-31 ENCOUNTER — Encounter: Payer: Self-pay | Admitting: Emergency Medicine

## 2019-03-31 ENCOUNTER — Ambulatory Visit (INDEPENDENT_AMBULATORY_CARE_PROVIDER_SITE_OTHER)
Admission: EM | Admit: 2019-03-31 | Discharge: 2019-03-31 | Disposition: A | Discharge status: Home / Self Care | Payer: Medicare Other | Source: Home / Self Care | Attending: Family Medicine | Admitting: Family Medicine

## 2019-03-31 ENCOUNTER — Inpatient Hospital Stay: Payer: Medicare Other

## 2019-03-31 ENCOUNTER — Ambulatory Visit: Payer: Medicare Other

## 2019-03-31 DIAGNOSIS — I248 Other forms of acute ischemic heart disease: Secondary | ICD-10-CM | POA: Diagnosis present

## 2019-03-31 DIAGNOSIS — J90 Pleural effusion, not elsewhere classified: Secondary | ICD-10-CM

## 2019-03-31 DIAGNOSIS — Z9889 Other specified postprocedural states: Secondary | ICD-10-CM

## 2019-03-31 DIAGNOSIS — I4891 Unspecified atrial fibrillation: Secondary | ICD-10-CM | POA: Diagnosis present

## 2019-03-31 DIAGNOSIS — Z1159 Encounter for screening for other viral diseases: Secondary | ICD-10-CM | POA: Diagnosis not present

## 2019-03-31 DIAGNOSIS — R778 Other specified abnormalities of plasma proteins: Secondary | ICD-10-CM | POA: Diagnosis present

## 2019-03-31 DIAGNOSIS — R001 Bradycardia, unspecified: Secondary | ICD-10-CM | POA: Diagnosis not present

## 2019-03-31 DIAGNOSIS — N183 Chronic kidney disease, stage 3 unspecified: Secondary | ICD-10-CM | POA: Diagnosis present

## 2019-03-31 DIAGNOSIS — E7800 Pure hypercholesterolemia, unspecified: Secondary | ICD-10-CM | POA: Diagnosis present

## 2019-03-31 DIAGNOSIS — C9110 Chronic lymphocytic leukemia of B-cell type not having achieved remission: Secondary | ICD-10-CM

## 2019-03-31 DIAGNOSIS — R0602 Shortness of breath: Secondary | ICD-10-CM

## 2019-03-31 DIAGNOSIS — Z79899 Other long term (current) drug therapy: Secondary | ICD-10-CM

## 2019-03-31 DIAGNOSIS — N400 Enlarged prostate without lower urinary tract symptoms: Secondary | ICD-10-CM | POA: Diagnosis present

## 2019-03-31 DIAGNOSIS — I129 Hypertensive chronic kidney disease with stage 1 through stage 4 chronic kidney disease, or unspecified chronic kidney disease: Secondary | ICD-10-CM | POA: Diagnosis present

## 2019-03-31 DIAGNOSIS — Z20828 Contact with and (suspected) exposure to other viral communicable diseases: Secondary | ICD-10-CM | POA: Diagnosis not present

## 2019-03-31 DIAGNOSIS — I10 Essential (primary) hypertension: Secondary | ICD-10-CM | POA: Diagnosis not present

## 2019-03-31 DIAGNOSIS — G4730 Sleep apnea, unspecified: Secondary | ICD-10-CM | POA: Diagnosis not present

## 2019-03-31 DIAGNOSIS — R7989 Other specified abnormal findings of blood chemistry: Secondary | ICD-10-CM | POA: Diagnosis present

## 2019-03-31 DIAGNOSIS — Z950 Presence of cardiac pacemaker: Secondary | ICD-10-CM | POA: Diagnosis not present

## 2019-03-31 DIAGNOSIS — J9811 Atelectasis: Secondary | ICD-10-CM | POA: Diagnosis not present

## 2019-03-31 DIAGNOSIS — E785 Hyperlipidemia, unspecified: Secondary | ICD-10-CM | POA: Diagnosis present

## 2019-03-31 DIAGNOSIS — J939 Pneumothorax, unspecified: Secondary | ICD-10-CM | POA: Diagnosis not present

## 2019-03-31 LAB — SARS CORONAVIRUS 2 BY RT PCR (HOSPITAL ORDER, PERFORMED IN ~~LOC~~ HOSPITAL LAB): SARS Coronavirus 2: NEGATIVE

## 2019-03-31 LAB — COMPREHENSIVE METABOLIC PANEL
ALT: 14 U/L (ref 0–44)
ALT: 14 U/L (ref 0–44)
AST: 18 U/L (ref 15–41)
AST: 19 U/L (ref 15–41)
Albumin: 4.2 g/dL (ref 3.5–5.0)
Albumin: 4.2 g/dL (ref 3.5–5.0)
Alkaline Phosphatase: 48 U/L (ref 38–126)
Alkaline Phosphatase: 46 U/L (ref 38–126)
Anion gap: 9 (ref 5–15)
Anion gap: 9 (ref 5–15)
BUN: 17 mg/dL (ref 8–23)
BUN: 17 mg/dL (ref 8–23)
CO2: 23 mmol/L (ref 22–32)
CO2: 23 mmol/L (ref 22–32)
Calcium: 8.9 mg/dL (ref 8.9–10.3)
Calcium: 8.6 mg/dL — ABNORMAL LOW (ref 8.9–10.3)
Chloride: 103 mmol/L (ref 98–111)
Chloride: 105 mmol/L (ref 98–111)
Creatinine, Ser: 1.21 mg/dL (ref 0.61–1.24)
Creatinine, Ser: 1.16 mg/dL (ref 0.61–1.24)
GFR calc Af Amer: >60 mL/min (ref >60)
GFR calc Af Amer: >60 mL/min (ref >60)
GFR calc non Af Amer: 54 mL/min — ABNORMAL LOW (ref >60)
GFR calc non Af Amer: 57 mL/min — ABNORMAL LOW (ref >60)
Glucose, Bld: 110 mg/dL — ABNORMAL HIGH (ref 70–99)
Glucose, Bld: 98 mg/dL (ref 70–99)
Potassium: 4.5 mmol/L (ref 3.5–5.1)
Potassium: 4.3 mmol/L (ref 3.5–5.1)
Sodium: 135 mmol/L (ref 135–145)
Sodium: 137 mmol/L (ref 135–145)
Total Bilirubin: 1.1 mg/dL (ref 0.3–1.2)
Total Bilirubin: 1.4 mg/dL — ABNORMAL HIGH (ref 0.3–1.2)
Total Protein: 6.7 g/dL (ref 6.5–8.1)
Total Protein: 6.2 g/dL — ABNORMAL LOW (ref 6.5–8.1)

## 2019-03-31 LAB — LACTATE DEHYDROGENASE: LDH: 159 U/L (ref 98–192)

## 2019-03-31 LAB — CBC WITH DIFFERENTIAL/PLATELET
Abs Immature Granulocytes: 0.03 10*3/uL (ref 0.00–0.07)
Abs Immature Granulocytes: 0.03 10*3/uL (ref 0.00–0.07)
Basophils Absolute: 0.1 10*3/uL (ref 0.0–0.1)
Basophils Absolute: 0.1 10*3/uL (ref 0.0–0.1)
Basophils Relative: 1 %
Basophils Relative: 1 %
Eosinophils Absolute: 0.1 10*3/uL (ref 0.0–0.5)
Eosinophils Absolute: 0.1 10*3/uL (ref 0.0–0.5)
Eosinophils Relative: 1 %
Eosinophils Relative: 1 %
HCT: 49.2 % (ref 39.0–52.0)
HCT: 47.8 % (ref 39.0–52.0)
Hemoglobin: 16.6 g/dL (ref 13.0–17.0)
Hemoglobin: 15.7 g/dL (ref 13.0–17.0)
Immature Granulocytes: 0 %
Immature Granulocytes: 0 %
Lymphocytes Relative: 19 %
Lymphocytes Relative: 22 %
Lymphs Abs: 1.5 10*3/uL (ref 0.7–4.0)
Lymphs Abs: 1.7 10*3/uL (ref 0.7–4.0)
MCH: 29.5 pg (ref 26.0–34.0)
MCH: 28.7 pg (ref 26.0–34.0)
MCHC: 33.7 g/dL (ref 30.0–36.0)
MCHC: 32.8 g/dL (ref 30.0–36.0)
MCV: 87.5 fL (ref 80.0–100.0)
MCV: 87.4 fL (ref 80.0–100.0)
Monocytes Absolute: 0.6 10*3/uL (ref 0.1–1.0)
Monocytes Absolute: 0.7 10*3/uL (ref 0.1–1.0)
Monocytes Relative: 8 %
Monocytes Relative: 9 %
Neutro Abs: 5.5 10*3/uL (ref 1.7–7.7)
Neutro Abs: 5.3 10*3/uL (ref 1.7–7.7)
Neutrophils Relative %: 71 %
Neutrophils Relative %: 67 %
Platelets: 153 10*3/uL (ref 150–400)
Platelets: 169 10*3/uL (ref 150–400)
RBC: 5.62 MIL/uL (ref 4.22–5.81)
RBC: 5.47 MIL/uL (ref 4.22–5.81)
RDW: 13.4 % (ref 11.5–15.5)
RDW: 13.2 % (ref 11.5–15.5)
WBC: 7.8 10*3/uL (ref 4.0–10.5)
WBC: 7.9 10*3/uL (ref 4.0–10.5)
nRBC: 0 % (ref 0.0–0.2)
nRBC: 0 % (ref 0.0–0.2)

## 2019-03-31 LAB — TROPONIN I: Troponin I: 0.03 ng/mL (ref <0.03)

## 2019-03-31 NOTE — ED Notes (Signed)
Floor unable to take report at this time.

## 2019-03-31 NOTE — Telephone Encounter (Signed)
Spoke with the patient he was in the office for labs this morning and reported to Ms. Lattie Haw that he was having SOB, Dr Mike Gip requested that we get in touch with the patient and offer him to go the the [facility] or Urgent care. The patient does report that he was having SOB and think that he need to be seen by somebody. The patient was agreeable to go to Urgent care now to be examine. I will reach out to the patient in about 1 hour to check and see if he made it to Urgent care.

## 2019-03-31 NOTE — ED Triage Notes (Signed)
Paient c/o SOB off and on for 6 weeks.  Patient denies any cold symptoms.  Patient denies fevers.  Patient uses a CPAP at night.  Patient also has a pacemaker.  Patient denies any pain.

## 2019-03-31 NOTE — Progress Notes (Incomplete)
[facility]  [address], [address] [place], [place] [address] Phone: [phone]  Fax: [phone]   Telemedicine Office Visit:  03/31/2019  Referring physician: No ref. provider found  I connected with Olga Coaster on 03/31/2019 at 8:46 AM by videoconferencing and verified that I was speaking with the correct person using 2 identifiers.  The patient was at *** home.  I discussed the limitations, risk, security and privacy concerns of performing an evaluation and management service by videoconferencing and the availability of in person appointments.  I also discussed with the patient that there may be a patient responsible charge related to this service.  The patient expressed understanding and agreed to proceed.   Chief Complaint: Brent Hoover is a 85 y.o. male with CLL who is seen for new assessment and follow-up after recent hospitalization.   HPI: He was initially diagnosed with CLL in ***. He presented with *** He has a history of splenomegaly and chronic kidney disease.   He was initially seen in the hematology clinic by *** He transitioned to the care of Dr. Charlaine Dalton on 08/06/2015. He was asymptomatic and treatment was not started at that time. He has been followed every 6 months.   Workup on 11/05/2015: flow cytometry revealed CLL, B-cell, CD38 negative. Hepatitis B surface antibody was negative. 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 IgG 525, IgA 104, IgM 16.   WBC was followed and ranged between 69,000 and 134,000 between 06/05/2014 - 07/27/2017.  He presented with B symptoms (30-40lbs in 6 months, night sweats), early satiety, and LLQ abdominal discomfort on 07/27/2017.  Abdomen and pelvis CT on 07/29/2017 revealed splenomegaly (volume 1000 cc). There were an abnormal number of scattered lymph nodes in the abdomen and pelvis. There was stranding along some omental adipose tissue in the left lower quadrant,  possibly due to a remote omental infarct or less likely appendagitis epiploica.  He began [place] (obinutuzumab) on 08/04/2017. He received Gazyva monthly x 6 from 08/04/2017 - 01/05/2018.  He has subsequently been under surveillance every 27-months.   WBC has been between 5500 - 7800 from 09/01/2017 - 03/31/2019.  The patient was last seen in the oncology clinic on 10/04/2018 by Dr. Rogue Bussing.  At that time, he was doing well and asymptomatic. He denied any melena or heamtochezia.  Appetite was good.   On 03/31/2019, he presented to the [facility] for shortness of breath for 3 weeks. CXR showed new moderate left pleural effusion with adjacent lingular and left lower lobe atelectasis. The right lung was clear.  There was no pneumothorax or acute osseous abnormality.  There was an unnchanged left chest wall pacemaker. There was stable cardiomegaly.  There was atherosclerotic calcification of the aortic arch and normal pulmonary vascularity.   He was admitted to [facility] from 03/31/2019 - 06/x/2020 with shortness of breath and a left sided pleural effusion.  During the interim, the patient ***   Past Medical History:  Diagnosis Date  . Bladder neck obstruction   . Cataracts, bilateral   . Chronic lymphocytic leukemia (CLL), B-cell (HCC)    Lambda positive B-cell CLL  . Gout   . Hypercholesterolemia   . Hypertension   . Lymphocytosis   . Sleep apnea   . Thrombocytopenia ([place])     Past Surgical History:  Procedure Laterality Date  . COLONOSCOPY WITH PROPOFOL N/A 09/10/2015   Procedure: COLONOSCOPY WITH PROPOFOL;  Surgeon: Lucilla Lame, MD;  Location: [facility]  ENDOSCOPY;  Service: Endoscopy;  Laterality: N/A;  . PROSTATE SURGERY  2008    Family History  Problem Relation Age of Onset  . Lung cancer Mother 8       deceased  . Brain cancer Son 73       pt's only son  . Polycythemia Brother        half brother  . Prostate cancer Brother     Social History:  reports that he has never  smoked. He has never used smokeless tobacco. He reports that he does not drink alcohol or use drugs.The patient is ***alone/accompanied by *** today.  Participants in the patient's visit and their role in the encounter included the patient, ***, and Waymon Budge, RN or Vito Berger, CMA, today.  The intake visit was provided by *** Waymon Budge, RN or Vito Berger, [place].  Allergies:  Allergies  Allergen Reactions  . Penicillins Other (See Comments)    Has patient had a PCN reaction causing immediate rash, facial/tongue/throat swelling, SOB or lightheadedness with hypotension: Unknown Has patient had a PCN reaction causing severe rash involving mucus membranes or skin necrosis: Unknown Has patient had a PCN reaction that required hospitalization: Unknown Has patient had a PCN reaction occurring within the last 10 years: Unknown If all of the above answers are "NO", then may proceed with Cephalosporin use.    Current Medications: Current Outpatient Medications  Medication Sig Dispense Refill  . acetaminophen (TYLENOL) 325 MG tablet Take 650 mg by mouth daily.     Marland Kitchen acyclovir (ZOVIRAX) 400 MG tablet One pill a day [to prevent shingles] 60 tablet 3  . allopurinol (ZYLOPRIM) 100 MG tablet Take 200 mg by mouth daily.     Marland Kitchen amLODipine (NORVASC) 5 MG tablet Take 5 mg by mouth daily.     Marland Kitchen atorvastatin (LIPITOR) 20 MG tablet Take 10 mg by mouth daily.     . benzonatate (TESSALON) 200 MG capsule Take 1 capsule (200 mg total) by mouth 3 (three) times daily as needed. 30 capsule 0  . levofloxacin (LEVAQUIN) 500 MG tablet Take 1 tablet (500 mg total) by mouth daily. 10 tablet 0  . lisinopril (PRINIVIL,ZESTRIL) 20 MG tablet Take 1 tablet (20 mg total) by mouth daily. 90 tablet 2  . ranitidine (ZANTAC) 150 MG capsule Take 150 mg by mouth 2 (two) times daily. Start taking 2 days prior to [facility] IV infusion     No current facility-administered medications for this visit.     Review  of Systems  Constitutional: Negative for chills, diaphoresis, fever, malaise/fatigue and weight loss.  HENT: Negative for congestion, hearing loss, sinus pain and sore throat.   Eyes: Negative for blurred vision.  Respiratory: Negative for cough, sputum production and shortness of breath.   Cardiovascular: Negative for chest pain, palpitations, claudication, leg swelling and PND.  Gastrointestinal: Negative for abdominal pain, blood in stool, constipation, diarrhea, heartburn, melena, nausea and vomiting.  Genitourinary: Negative for dysuria, frequency and urgency.  Musculoskeletal: Negative for back pain, joint pain and myalgias.  Skin: Negative for itching and rash.  Neurological: Negative for dizziness, tingling, sensory change, seizures, weakness and headaches.  Endo/Heme/Allergies: Does not bruise/bleed easily.  Psychiatric/Behavioral: Negative for depression and memory loss. The patient is not nervous/anxious and does not have insomnia.   All other systems reviewed and are negative.   Performance status (ECOG): 0  Physical Exam  Constitutional: He is oriented to person, place, and time. He appears well-developed and well-nourished. No distress.  Eyes: Pupils are equal, round, and reactive to light. Conjunctivae and EOM are normal. No scleral icterus.  Neurological: He is alert and oriented to person, place, and time.  Skin: Skin is warm and dry. No rash noted. He is not diaphoretic. No erythema.  Psychiatric: He has a normal mood and affect. His behavior is normal. Judgment and thought content normal.  Nursing note reviewed.   No visits with results within 3 Day(s) from this visit.  Latest known visit with results is:  Admission on 12/07/2018, Discharged on 12/07/2018  Component Date Value Ref Range Status  . Color, Urine 12/07/2018 STRAW* YELLOW Final  . APPearance 12/07/2018 CLEAR  CLEAR Final  . Specific Gravity, Urine 12/07/2018 1.010  1.005 - 1.030 Final  . pH 12/07/2018  6.5  5.0 - 8.0 Final  . Glucose, UA 12/07/2018 NEGATIVE  NEGATIVE mg/dL Final  . Hgb urine dipstick 12/07/2018 TRACE* NEGATIVE Final  . Bilirubin Urine 12/07/2018 NEGATIVE  NEGATIVE Final  . Ketones, ur 12/07/2018 NEGATIVE  NEGATIVE mg/dL Final  . Protein, ur 12/07/2018 NEGATIVE  NEGATIVE mg/dL Final  . Nitrite 12/07/2018 NEGATIVE  NEGATIVE Final  . Leukocytes, UA 12/07/2018 TRACE* NEGATIVE Final  . Squamous Epithelial / LPF 12/07/2018 0-5  0 - 5 Final  . WBC, UA 12/07/2018 0-5  0 - 5 WBC/hpf Final  . RBC / HPF 12/07/2018 NONE SEEN  0 - 5 RBC/hpf Final  . Bacteria, UA 12/07/2018 NONE SEEN  NONE SEEN Final   Performed at [facility], [address] [place], [place] [address]  . Specimen Description 12/07/2018    Final                   Value:URINE, CLEAN CATCH Performed at [facility], [address] [place], [place] [address]   . Special Requests 12/07/2018    Final                   Value:Normal Performed at [facility], [address] [place], [place] [address]   . Culture 12/07/2018    Final                   Value:NO GROWTH Performed at [facility], [place] [address], [place], [place] [address]   . Report Status 12/07/2018 12/08/2018 FINAL   Final    Assessment:  PAYTON PRINSEN is a 85 y.o. male with chronic lymphocytic leukemia. Flow cytometry revealed CLL, B-cell, CD38 negative. 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  WBC ranged between 69,000 and 134,000 between 06/05/2014 - 07/27/2017.  Abdomen and pelvis CT on 07/29/2017 revealed splenomegaly (volume 1000 cc). There were an abnormal number of scattered lymph nodes in the abdomen and pelvis.   He received Gazyva monthly x 6 from 08/04/2017 - 01/05/2018.   Abdomen and pelvis CT on 03/04/2018 revealed improved splenomegaly (480 cc) and resolved adenopathy.  He has a developmental venous variant (double IVC).  WBC has ranged between 5500 - 7800  from 09/01/2017 - 03/31/2019.  Symptomatically, ***  Plan: 1.   Chronic lymphocytic leukemia   I discussed the assessment and treatment plan with the patient.  The patient was provided an opportunity to ask questions and all were answered.  The patient agreed with the plan and demonstrated an understanding of the instructions.  The patient was advised to call back or seek an in person evaluation if the symptoms worsen or if the  condition fails to improve as anticipated.  I provided *** minutes (8:46 AM - X:XX) of face-to-face video visit time during this this encounter and > 50% was spent counseling as documented under my assessment and plan.  I provided these services from the [facility] office.   Nolon Stalls, MD, PhD  03/31/2019, 8:46 AM  I, Molly Dorshimer, am acting as Education administrator for [employer]. Mike Gip, MD, PhD.  {Add scribe attestation statement}

## 2019-03-31 NOTE — ED Provider Notes (Signed)
[facility]    CSN: [id number] Arrival date & time: 03/31/19  1029     History   Chief Complaint Chief Complaint  Patient presents with  . Shortness of Breath    HPI Brent Hoover is a 85 y.o. male.   85 yo male with a h/o chronic lymphocytic leukemia, sleep apnea presents with a c/o shortness of breath on and off for the past 6 weeks. States it's been progressively worse over the past week. Denies any fevers, chills, wheezing, chest pain, lower extremity edema, or palpitations. Patient has a pacemaker for a h/o arrhythmias.    Shortness of Breath    Past Medical History:  Diagnosis Date  . Bladder neck obstruction   . Cataracts, bilateral   . Chronic lymphocytic leukemia (CLL), B-cell (HCC)    Lambda positive B-cell CLL  . Gout   . Hypercholesterolemia   . Hypertension   . Lymphocytosis   . Sleep apnea   . Thrombocytopenia [facility])     Patient Active Problem List   Diagnosis Date Noted  . Hyponatremia 05/20/2018  . Splenomegaly 07/27/2017  . CLL (chronic lymphocytic leukemia) ([place]) 04/30/2015    Past Surgical History:  Procedure Laterality Date  . COLONOSCOPY WITH PROPOFOL N/A 09/10/2015   Procedure: COLONOSCOPY WITH PROPOFOL;  Surgeon: Lucilla Lame, MD;  Location: [facility] ENDOSCOPY;  Service: Endoscopy;  Laterality: N/A;  . PROSTATE SURGERY  2008       Home Medications    Prior to Admission medications   Medication Sig Start Date End Date Taking? Authorizing Provider  lisinopril (PRINIVIL,ZESTRIL) 20 MG tablet Take 1 tablet (20 mg total) by mouth daily. 06/07/18  Yes Cammie Sickle, MD  acetaminophen (TYLENOL) 325 MG tablet Take 650 mg by mouth daily.     [provider]  acyclovir (ZOVIRAX) 400 MG tablet One pill a day [to prevent shingles] 07/27/17   Cammie Sickle, MD  allopurinol (ZYLOPRIM) 100 MG tablet Take 200 mg by mouth daily.     [provider]  amLODipine (NORVASC) 5 MG tablet Take 5 mg by mouth daily.      [provider]  atorvastatin (LIPITOR) 20 MG tablet Take 10 mg by mouth daily.     [provider]  benzonatate (TESSALON) 200 MG capsule Take 1 capsule (200 mg total) by mouth 3 (three) times daily as needed. 12/07/18   Norval Gable, MD  levofloxacin (LEVAQUIN) 500 MG tablet Take 1 tablet (500 mg total) by mouth daily. 11/04/18   Luvenia Redden, PA-C  ranitidine (ZANTAC) 150 MG capsule Take 150 mg by mouth 2 (two) times daily. Start taking 2 days prior to [facility] IV infusion    [provider]    Family History Family History  Problem Relation Age of Onset  . Lung cancer Mother 36       deceased  . Brain cancer Son 19       pt's only son  . Polycythemia Brother        half brother  . Prostate cancer Brother     Social History Social History   Tobacco Use  . Smoking status: Never Smoker  . Smokeless tobacco: Never Used  Substance Use Topics  . Alcohol use: No    Alcohol/week: 0.0 standard drinks  . Drug use: No     Allergies   Penicillins   Review of Systems Review of Systems  Respiratory: Positive for shortness of breath.      Physical Exam  Triage Vital Signs ED Triage Vitals  Enc Vitals Group     BP 03/31/19 1044 (!) 143/78     Pulse Rate 03/31/19 1044 74     Resp 03/31/19 1044 17     Temp 03/31/19 1044 98.2 F (36.8 C)     Temp Source 03/31/19 1044 Oral     SpO2 03/31/19 1044 97 %     Weight 03/31/19 1039 230 lb (104.3 kg)     Height 03/31/19 1039 5\' 7"  (1.702 m)     Head Circumference --      Peak Flow --      Pain Score 03/31/19 1039 0     Pain Loc --      Pain Edu? --      Excl. in [place]? --    No data found.  Updated Vital Signs BP (!) 143/78   Pulse 74   Temp 98.2 F (36.8 C) (Oral)   Resp 17   Ht 5\' 7"  (1.702 m)   Wt 104.3 kg   SpO2 97%   BMI 36.02 kg/m   Visual Acuity Right Eye Distance:   Left Eye Distance:   Bilateral Distance:    Right Eye Near:   Left Eye Near:    Bilateral Near:     Physical  Exam Vitals signs and nursing note reviewed.  Constitutional:      General: He is not in acute distress.    Appearance: He is not toxic-appearing or diaphoretic.  Cardiovascular:     Rate and Rhythm: Normal rate.  Pulmonary:     Effort: No respiratory distress.     Breath sounds: No stridor.     Comments: Effort normal at rest, increased with exertion Neurological:     Mental Status: He is alert.      UC Treatments / Results  Labs (all labs ordered are listed, but only abnormal results are displayed) Labs Reviewed - No data to display  EKG None  Radiology Dg Chest 2 View  Result Date: 03/31/2019 CLINICAL DATA:  Shortness of breath for the past 3 weeks. EXAM: CHEST - 2 VIEW COMPARISON:  Chest x-ray dated November 04, 2018. FINDINGS: Unchanged left chest wall pacemaker. Stable cardiomegaly. Atherosclerotic calcification of the aortic arch. Normal pulmonary vascularity. New moderate left pleural effusion with adjacent lingular and left lower lobe atelectasis. The right lung is clear. No pneumothorax. No acute osseous abnormality. IMPRESSION: 1. New moderate left pleural effusion. Electronically Signed   By: Titus Dubin M.D.   On: 03/31/2019 12:33    Procedures Procedures (including critical care time)  Medications Ordered in UC Medications - No data to display  Initial Impression / Assessment and Plan / UC Course  I have reviewed the triage vital signs and the nursing notes.  Pertinent labs & imaging results that were available during my care of the patient were reviewed by me and considered in my medical decision making (see chart for details).      Final Clinical Impressions(s) / UC Diagnoses   Final diagnoses:  SOB (shortness of breath)  Pleural effusion on left  CLL (chronic lymphocytic leukemia) [facility])     Discharge Instructions     Recommend patient go to Emergency Department for further evaluation and management    ED Prescriptions    None     1.  x-ray results and diagnosis reviewed with patient; recommend patient go to Emergency Department for further evaluation and management. Patient verbalizes understanding, is in stable condition and will  proceed by private vehicle with granddaughter driving. Report called to [facility] ED triage RN.    Controlled Substance Prescriptions [place] Controlled Substance Registry consulted? Not Applicable   Norval Gable, MD 03/31/19 1355

## 2019-03-31 NOTE — ED Notes (Signed)
ED TO INPATIENT HANDOFF REPORT  ED Nurse Name and Phone #: Kirke Shaggy [phone]  S Name/Age/Gender Brent Hoover 85 y.o. male Room/Bed: ED30A/ED30A  Code Status   Code Status: Prior  Home/SNF/Other Home Patient oriented to: self, place, time and situation Is this baseline? Yes   Triage Complete: Triage complete  Chief Complaint sent by dr. SOB  Triage Note Patient presents to the ED with increased shortness of breath over the past 2 months.  Patient went to [place] today and was told to come to the ED due to a pleural effusion.  Patient states he feels great until he exerts himself and then becomes very short of breath.  Patient denies cough, congestion and fever.     Allergies Allergies  Allergen Reactions  . Penicillins Other (See Comments)    Has patient had a PCN reaction causing immediate rash, facial/tongue/throat swelling, SOB or lightheadedness with hypotension: Unknown Has patient had a PCN reaction causing severe rash involving mucus membranes or skin necrosis: Unknown Has patient had a PCN reaction that required hospitalization: Unknown Has patient had a PCN reaction occurring within the last 10 years: Unknown If all of the above answers are "NO", then may proceed with Cephalosporin use.    Level of Care/Admitting Diagnosis ED Disposition    ED Disposition Condition [facility] Area: [place] [[id number]]  Level of Care: Med-Surg [16]  Covid Evaluation: Confirmed COVID Negative  Diagnosis: Pleural effusion, left [[id number]]  Admitting Physician: Lance Coon [[id number]]  Attending Physician: Lance Coon [phone]  Estimated length of stay: past midnight tomorrow  Certification:: I certify this patient will need inpatient services for at least 2 midnights  Bed request comments: 1c  PT Class (Do Not Modify): Inpatient [101]  PT Acc Code (Do Not Modify): Private [1]       B Medical/Surgery History Past Medical History:   Diagnosis Date  . Bladder neck obstruction   . Cataracts, bilateral   . Chronic lymphocytic leukemia (CLL), B-cell (HCC)    Lambda positive B-cell CLL  . Gout   . Hypercholesterolemia   . Hypertension   . Lymphocytosis   . Sleep apnea   . Thrombocytopenia ([place])    Past Surgical History:  Procedure Laterality Date  . COLONOSCOPY WITH PROPOFOL N/A 09/10/2015   Procedure: COLONOSCOPY WITH PROPOFOL;  Surgeon: Lucilla Lame, MD;  Location: [facility] ENDOSCOPY;  Service: Endoscopy;  Laterality: N/A;  . PROSTATE SURGERY  2008     A IV Location/Drains/Wounds Patient Lines/Drains/Airways Status   Active Line/Drains/Airways    Name:   Placement date:   Placement time:   Site:   Days:   Peripheral IV 03/31/19 Right Arm   03/31/19    1627    Arm   less than 1          Intake/Output Last 24 hours No intake or output data in the 24 hours ending 03/31/19 2239  Labs/Imaging Results for orders placed or performed during the hospital encounter of 03/31/19 (from the past 48 hour(s))  Troponin I - ONCE - STAT     Status: Abnormal   Collection Time: 03/31/19  4:28 PM  Result Value Ref Range   Troponin I 0.03 (HH) <0.03 ng/mL    Comment: CRITICAL RESULT CALLED TO, READ BACK BY AND VERIFIED WITH JENNIFER WHITLEY AT 1706 03/31/2019.PMF Performed at [facility] [address] [place], [place] [address]   CBC with Differential     Status: None  Collection Time: 03/31/19  4:28 PM  Result Value Ref Range   WBC 7.9 4.0 - 10.5 K/uL   RBC 5.47 4.22 - 5.81 MIL/uL   Hemoglobin 15.7 13.0 - 17.0 g/dL   HCT 47.8 39.0 - 52.0 %   MCV 87.4 80.0 - 100.0 fL   MCH 28.7 26.0 - 34.0 pg   MCHC 32.8 30.0 - 36.0 g/dL   RDW 13.2 11.5 - 15.5 %   Platelets 169 150 - 400 K/uL   nRBC 0.0 0.0 - 0.2 %   Neutrophils Relative % 67 %   Neutro Abs 5.3 1.7 - 7.7 K/uL   Lymphocytes Relative 22 %   Lymphs Abs 1.7 0.7 - 4.0 K/uL   Monocytes Relative 9 %   Monocytes Absolute 0.7 0.1 - 1.0 K/uL   Eosinophils  Relative 1 %   Eosinophils Absolute 0.1 0.0 - 0.5 K/uL   Basophils Relative 1 %   Basophils Absolute 0.1 0.0 - 0.1 K/uL   Immature Granulocytes 0 %   Abs Immature Granulocytes 0.03 0.00 - 0.07 K/uL    Comment: Performed at [facility], [address] [place], [place] [address]  Comprehensive metabolic panel     Status: Abnormal   Collection Time: 03/31/19  4:28 PM  Result Value Ref Range   Sodium 137 135 - 145 mmol/L   Potassium 4.3 3.5 - 5.1 mmol/L   Chloride 105 98 - 111 mmol/L   CO2 23 22 - 32 mmol/L   Glucose, Bld 98 70 - 99 mg/dL   BUN 17 8 - 23 mg/dL   Creatinine, Ser 1.16 0.61 - 1.24 mg/dL   Calcium 8.6 (L) 8.9 - 10.3 mg/dL   Total Protein 6.2 (L) 6.5 - 8.1 g/dL   Albumin 4.2 3.5 - 5.0 g/dL   AST 19 15 - 41 U/L   ALT 14 0 - 44 U/L   Alkaline Phosphatase 46 38 - 126 U/L   Total Bilirubin 1.4 (H) 0.3 - 1.2 mg/dL   GFR calc non Af Amer 57 (L) >60 mL/min   GFR calc Af Amer >60 >60 mL/min   Anion gap 9 5 - 15    Comment: Performed at [facility], [address], [place], [place] [address]  SARS Coronavirus 2 (CEPHEID - Performed in [place] hospital lab), Hosp Order     Status: None   Collection Time: 03/31/19  4:28 PM  Result Value Ref Range   SARS Coronavirus 2 NEGATIVE NEGATIVE    Comment: (NOTE) If result is NEGATIVE SARS-CoV-2 target nucleic acids are NOT DETECTED. The SARS-CoV-2 RNA is generally detectable in upper and lower  respiratory specimens during the acute phase of infection. The lowest  concentration of SARS-CoV-2 viral copies this assay can detect is 250  copies / mL. A negative result does not preclude SARS-CoV-2 infection  and should not be used as the sole basis for treatment or other  patient management decisions.  A negative result may occur with  improper specimen collection / handling, submission of specimen other  than nasopharyngeal swab, presence of viral mutation(s) within the  areas targeted by this assay, and  inadequate number of viral copies  (<250 copies / mL). A negative result must be combined with clinical  observations, patient history, and epidemiological information. If result is POSITIVE SARS-CoV-2 target nucleic acids are DETECTED. The SARS-CoV-2 RNA is generally detectable in upper and lower  respiratory specimens dur ing the acute phase of infection.  Positive  results  are indicative of active infection with SARS-CoV-2.  Clinical  correlation with patient history and other diagnostic information is  necessary to determine patient infection status.  Positive results do  not rule out bacterial infection or co-infection with other viruses. If result is PRESUMPTIVE POSTIVE SARS-CoV-2 nucleic acids MAY BE PRESENT.   A presumptive positive result was obtained on the submitted specimen  and confirmed on repeat testing.  While 2019 novel coronavirus  (SARS-CoV-2) nucleic acids may be present in the submitted sample  additional confirmatory testing may be necessary for epidemiological  and / or clinical management purposes  to differentiate between  SARS-CoV-2 and other Sarbecovirus currently known to infect humans.  If clinically indicated additional testing with an alternate test  methodology [phone]) is advised. The SARS-CoV-2 RNA is generally  detectable in upper and lower respiratory sp ecimens during the acute  phase of infection. The expected result is Negative. Fact Sheet for Patients:  StrictlyIdeas.no Fact Sheet for Healthcare Providers: BankingDealers.co.za This test is not yet approved or cleared by the [place] FDA and has been authorized for detection and/or diagnosis of SARS-CoV-2 by FDA under an Emergency Use Authorization (EUA).  This EUA will remain in effect (meaning this test can be used) for the duration of the COVID-19 declaration under Section 564(b)(1) of the Act, 21 U.S.C. section 360bbb-3(b)(1), unless the  authorization is terminated or revoked sooner. Performed at [facility], [address] [place], [place] [address]    Dg Chest 2 View  Result Date: 03/31/2019 CLINICAL DATA:  Shortness of breath for the past 3 weeks. EXAM: CHEST - 2 VIEW COMPARISON:  Chest x-ray dated November 04, 2018. FINDINGS: Unchanged left chest wall pacemaker. Stable cardiomegaly. Atherosclerotic calcification of the aortic arch. Normal pulmonary vascularity. New moderate left pleural effusion with adjacent lingular and left lower lobe atelectasis. The right lung is clear. No pneumothorax. No acute osseous abnormality. IMPRESSION: 1. New moderate left pleural effusion. Electronically Signed   By: Titus Dubin M.D.   On: 03/31/2019 12:33    Pending Labs [employer] (From admission, onward)    Start     Ordered   Signed and Held  CBC  (enoxaparin (LOVENOX)    CrCl >/= 30 ml/min)  Once,   R    Comments:  Baseline for enoxaparin therapy IF NOT ALREADY DRAWN.  Notify MD if PLT < 100 K.    Signed and Held   Signed and Held  Creatinine, serum  (enoxaparin (LOVENOX)    CrCl >/= 30 ml/min)  Once,   R    Comments:  Baseline for enoxaparin therapy IF NOT ALREADY DRAWN.    Signed and Held   Signed and Held  Creatinine, serum  (enoxaparin (LOVENOX)    CrCl >/= 30 ml/min)  Weekly,   R    Comments:  while on enoxaparin therapy    Signed and Held   Signed and Held  Basic metabolic panel  Tomorrow morning,   R     Signed and Held   Signed and Held  CBC  Tomorrow morning,   R     Signed and Held          Vitals/Pain Today's Vitals   03/31/19 1347 03/31/19 1635 03/31/19 1942 03/31/19 2229  BP:  (!) 158/88 (!) 162/101 (!) 164/97  Pulse:  65 81 62  Resp:  18 20 18   Temp:   97.8 F (36.6 C)   TempSrc:   Oral   SpO2:  98%  98% 98%  Weight: 104.3 kg     Height: 5\' 7"  (1.702 m)     PainSc: 0-No pain  0-No pain     Isolation Precautions No active isolations  Medications Medications - No data to  display  Mobility walks Low fall risk   Focused Assessments Cardiac Assessment Handoff:  Cardiac Rhythm: Sinus bradycardia Lab Results  Component Value Date   CKTOTAL <5 (L) 10/05/2017   CKMB 4.0 10/05/2017   TROPONINI 0.03 (HH) 03/31/2019   No results found for: DDIMER Does the Patient currently have chest pain? No     R Recommendations: See Admitting Provider Note  Report given to:   Additional Notes:

## 2019-03-31 NOTE — ED Notes (Signed)
Pt transport to room 107 by this tech

## 2019-03-31 NOTE — Discharge Instructions (Signed)
Recommend patient go to Emergency Department for further evaluation and management °

## 2019-03-31 NOTE — ED Notes (Signed)
Date and time results received: 03/31/19 5:05 PM   Test: Troponin Critical Value: 0.03  Name of Provider Notified: Corky Downs

## 2019-03-31 NOTE — H&P (Signed)
[place] [facility] [place] NAME: Brent Hoover    MR#:  [id number]  DATE OF BIRTH:  08-06-1930  DATE OF ADMISSION:  03/31/2019  PRIMARY CARE PHYSICIAN: System, Provider Not In   REQUESTING/REFERRING PHYSICIAN: Sherral Hammers, [place]  CHIEF COMPLAINT:   Chief Complaint  Patient presents with  . Shortness of Breath    HISTORY OF PRESENT ILLNESS:  Brent Hoover  is a 85 y.o. male who presents with chief complaint as above.  Patient states he has had progressive onset of the shortness of breath over some time.  He now is unable to walk even a short distance around his house or up a flight of stairs without becoming winded.  On evaluation here he is found to have moderate left-sided pleural effusion.  This is new.  He also has an elevated troponin.  Hospitalist were called for admission.  PAST MEDICAL HISTORY:   Past Medical History:  Diagnosis Date  . Bladder neck obstruction   . Cataracts, bilateral   . Chronic lymphocytic leukemia (CLL), B-cell (HCC)    Lambda positive B-cell CLL  . Gout   . Hypercholesterolemia   . Hypertension   . Lymphocytosis   . Sleep apnea   . Thrombocytopenia ([place])      PAST SURGICAL HISTORY:   Past Surgical History:  Procedure Laterality Date  . COLONOSCOPY WITH PROPOFOL N/A 09/10/2015   Procedure: COLONOSCOPY WITH PROPOFOL;  Surgeon: Lucilla Lame, MD;  Location: [facility] ENDOSCOPY;  Service: Endoscopy;  Laterality: N/A;  . PROSTATE SURGERY  2008     SOCIAL HISTORY:   Social History   Tobacco Use  . Smoking status: Never Smoker  . Smokeless tobacco: Never Used  Substance Use Topics  . Alcohol use: No    Alcohol/week: 0.0 standard drinks     FAMILY HISTORY:   Family History  Problem Relation Age of Onset  . Lung cancer Mother 26       deceased  . Brain cancer Son 62       pt's only son  . Polycythemia Brother        half brother  . Prostate cancer Brother      DRUG ALLERGIES:   Allergies   Allergen Reactions  . Penicillins Other (See Comments)    Has patient had a PCN reaction causing immediate rash, facial/tongue/throat swelling, SOB or lightheadedness with hypotension: Unknown Has patient had a PCN reaction causing severe rash involving mucus membranes or skin necrosis: Unknown Has patient had a PCN reaction that required hospitalization: Unknown Has patient had a PCN reaction occurring within the last 10 years: Unknown If all of the above answers are "NO", then may proceed with Cephalosporin use.    MEDICATIONS AT HOME:   Prior to Admission medications   Medication Sig Start Date End Date Taking? Authorizing Provider  lisinopril (PRINIVIL,ZESTRIL) 20 MG tablet Take 1 tablet (20 mg total) by mouth daily. 06/07/18  Yes Cammie Sickle, MD  tamsulosin (FLOMAX) 0.4 MG CAPS capsule Take 0.4 mg by mouth daily.   Yes [provider]  acyclovir (ZOVIRAX) 400 MG tablet One pill a day [to prevent shingles] Patient not taking: Reported on 03/31/2019 07/27/17   Cammie Sickle, MD    REVIEW OF SYSTEMS:  Review of Systems  Constitutional: Negative for chills, fever, malaise/fatigue and weight loss.  HENT: Negative for ear pain, hearing loss and tinnitus.   Eyes: Negative for blurred vision, double vision, pain and redness.  Respiratory:  Positive for shortness of breath. Negative for cough and hemoptysis.   Cardiovascular: Negative for chest pain, palpitations, orthopnea and leg swelling.  Gastrointestinal: Negative for abdominal pain, constipation, diarrhea, nausea and vomiting.  Genitourinary: Negative for dysuria, frequency and hematuria.  Musculoskeletal: Negative for back pain, joint pain and neck pain.  Skin:       No acne, rash, or lesions  Neurological: Negative for dizziness, tremors, focal weakness and weakness.  Endo/Heme/Allergies: Negative for polydipsia. Does not bruise/bleed easily.  Psychiatric/Behavioral: Negative for depression. The patient is  not nervous/anxious and does not have insomnia.      VITAL SIGNS:   Vitals:   03/31/19 1347 03/31/19 1635 03/31/19 1942 03/31/19 2229  BP:  (!) 158/88 (!) 162/101 (!) 164/97  Pulse:  65 81 62  Resp:  18 20 18   Temp:   97.8 F (36.6 C)   TempSrc:   Oral   SpO2:  98% 98% 98%  Weight: 104.3 kg     Height: 5\' 7"  (1.702 m)      Wt Readings from Last 3 Encounters:  03/31/19 104.3 kg  03/31/19 104.3 kg  12/07/18 97.5 kg    PHYSICAL EXAMINATION:  Physical Exam  Vitals reviewed. Constitutional: He is oriented to person, place, and time. He appears well-developed and well-nourished. No distress.  HENT:  Head: Normocephalic and atraumatic.  Mouth/Throat: Oropharynx is clear and moist.  Eyes: Pupils are equal, round, and reactive to light. Conjunctivae and EOM are normal. No scleral icterus.  Neck: Normal range of motion. Neck supple. No JVD present. No thyromegaly present.  Cardiovascular: Normal rate, regular rhythm and intact distal pulses. Exam reveals no gallop and no friction rub.  No murmur heard. Respiratory: Effort normal and breath sounds normal. No respiratory distress. He has no wheezes. He has no rales.  Decreased breath sounds left base  GI: Soft. Bowel sounds are normal. He exhibits no distension. There is no abdominal tenderness.  Musculoskeletal: Normal range of motion.        General: No edema.     Comments: No arthritis, no gout  Lymphadenopathy:    He has no cervical adenopathy.  Neurological: He is alert and oriented to person, place, and time. No cranial nerve deficit.  No dysarthria, no aphasia  Skin: Skin is warm and dry. No rash noted. No erythema.  Psychiatric: He has a normal mood and affect. His behavior is normal. Judgment and thought content normal.    LABORATORY PANEL:   CBC Recent Labs  Lab 03/31/19 1628  WBC 7.9  HGB 15.7  HCT 47.8  PLT 169    ------------------------------------------------------------------------------------------------------------------  Chemistries  Recent Labs  Lab 03/31/19 1628  NA 137  K 4.3  CL 105  CO2 23  GLUCOSE 98  BUN 17  CREATININE 1.16  CALCIUM 8.6*  AST 19  ALT 14  ALKPHOS 46  BILITOT 1.4*   ------------------------------------------------------------------------------------------------------------------  Cardiac Enzymes Recent Labs  Lab 03/31/19 1628  TROPONINI 0.03*   ------------------------------------------------------------------------------------------------------------------  RADIOLOGY:  Dg Chest 2 View  Result Date: 03/31/2019 CLINICAL DATA:  Shortness of breath for the past 3 weeks. EXAM: CHEST - 2 VIEW COMPARISON:  Chest x-ray dated November 04, 2018. FINDINGS: Unchanged left chest wall pacemaker. Stable cardiomegaly. Atherosclerotic calcification of the aortic arch. Normal pulmonary vascularity. New moderate left pleural effusion with adjacent lingular and left lower lobe atelectasis. The right lung is clear. No pneumothorax. No acute osseous abnormality. IMPRESSION: 1. New moderate left pleural effusion. Electronically Signed   By:  Titus Dubin M.D.   On: 03/31/2019 12:33    EKG:   Orders placed or performed during the hospital encounter of 03/31/19  . ED EKG  . ED EKG  . ED EKG  . ED EKG    IMPRESSION AND PLAN:  Principal Problem:   Pleural effusion, left -patient might benefit from thoracentesis.  Unclear etiology for his effusion.  If thoracentesis reveals transudate, might consider evaluation for heart failure Active Problems:   Elevated troponin -serial enzymes tonight, if his enzyme levels go up would need to work-up for possible primary ACS versus heart failure   CLL (chronic lymphocytic leukemia) ([place]) -oncology consult placed, oncology was contacted by ED provider and is aware of the patient's admission   HTN (hypertension) -home dose  antihypertensives   HLD (hyperlipidemia) -home dose antilipid   CKD (chronic kidney disease), Hoover III ([place]) -at baseline, avoid nephrotoxins and monitor  Chart review performed and case discussed with ED provider. Labs, imaging and/or ECG reviewed by provider and discussed with patient/family. Management plans discussed with the patient and/or family.  COVID-19 status: Tested negative     DVT PROPHYLAXIS: SubQ heparin  GI PROPHYLAXIS:  None  ADMISSION STATUS: Inpatient     CODE STATUS: Full Code Status History    Date Active Date Inactive Code Status Order ID Comments User Context   05/20/2018 2115 05/21/2018 1849 Full Code [id number]  Henreitta Leber, MD Inpatient    Advance Directive Documentation     Most Recent Value  Type of Advance Directive  Healthcare Power of [place], Living will  Pre-existing out of facility DNR order (yellow form or pink MOST form)  -  "MOST" Form in Place?  -      TOTAL TIME TAKING CARE OF THIS PATIENT: 45 minutes.   This patient was evaluated in the context of the global COVID-19 pandemic, which necessitated consideration that the patient might be at risk for infection with the SARS-CoV-2 virus that causes COVID-19. Institutional protocols and algorithms that pertain to the evaluation of patients at risk for COVID-19 are in a state of rapid change based on information released by regulatory bodies including the CDC and federal and state organizations. These policies and algorithms were followed to the best of this provider's knowledge to date during the patient's care at this facility.  Ethlyn Daniels 03/31/2019, 10:34 PM  [facility]  Office  [phone]  CC: Primary care physician; System, Provider Not In  Note:  This document was prepared using Dragon voice recognition software and may include unintentional dictation errors.

## 2019-03-31 NOTE — ED Provider Notes (Signed)
[facility] Emergency Department Provider Note  ____________________________________________  Time seen: Approximately 4:50 PM  I have reviewed the triage vital signs and the nursing notes.   HISTORY  Chief Complaint Shortness of Breath    HPI Brent Hoover is a 85 y.o. male with a history of chronic lymphocytic leukemia, sleep apnea, hypercholesterolemia and hypertension, with shortness of breath worsened with exertion.  Patient also states that he has had shortness of breath that has awoken him from sleep.  He has had intermittent cough.  He denies nasal congestion, rhinorrhea, fever, headache or diarrhea.  Patient states that he has experienced shortness of breath over the past 6 weeks but it seems to be worsening in intensity.  He denies chest pain, chest tightness or abdominal pain. No other alleviating measures have been attempted.         Past Medical History:  Diagnosis Date  . Bladder neck obstruction   . Cataracts, bilateral   . Chronic lymphocytic leukemia (CLL), B-cell (HCC)    Lambda positive B-cell CLL  . Gout   . Hypercholesterolemia   . Hypertension   . Lymphocytosis   . Sleep apnea   . Thrombocytopenia [facility])     Patient Active Problem List   Diagnosis Date Noted  . Hyponatremia 05/20/2018  . Splenomegaly 07/27/2017  . CLL (chronic lymphocytic leukemia) ([place]) 04/30/2015    Past Surgical History:  Procedure Laterality Date  . COLONOSCOPY WITH PROPOFOL N/A 09/10/2015   Procedure: COLONOSCOPY WITH PROPOFOL;  Surgeon: Lucilla Lame, MD;  Location: [facility] ENDOSCOPY;  Service: Endoscopy;  Laterality: N/A;  . PROSTATE SURGERY  2008    Prior to Admission medications   Medication Sig Start Date End Date Taking? Authorizing Provider  lisinopril (PRINIVIL,ZESTRIL) 20 MG tablet Take 1 tablet (20 mg total) by mouth daily. 06/07/18  Yes Cammie Sickle, MD  tamsulosin (FLOMAX) 0.4 MG CAPS capsule Take 0.4 mg by mouth daily.   Yes  [provider]  acyclovir (ZOVIRAX) 400 MG tablet One pill a day [to prevent shingles] Patient not taking: Reported on 03/31/2019 07/27/17   Cammie Sickle, MD    Allergies Penicillins  Family History  Problem Relation Age of Onset  . Lung cancer Mother 6       deceased  . Brain cancer Son 51       pt's only son  . Polycythemia Brother        half brother  . Prostate cancer Brother     Social History Social History   Tobacco Use  . Smoking status: Never Smoker  . Smokeless tobacco: Never Used  Substance Use Topics  . Alcohol use: No    Alcohol/week: 0.0 standard drinks  . Drug use: No     Review of Systems  Constitutional: No fever/chills Eyes: No visual changes. No discharge ENT: No upper respiratory complaints. Cardiovascular: no chest pain. Respiratory: Patient has sporadic cough. Patient has SOB.  Gastrointestinal: No abdominal pain.  No nausea, no vomiting.  No diarrhea.  No constipation. Genitourinary: Negative for dysuria. No hematuria Musculoskeletal: Negative for musculoskeletal pain. Skin: Negative for rash, abrasions, lacerations, ecchymosis. Neurological: Negative for headaches, focal weakness or numbness.  ___________________________________   PHYSICAL EXAM:  VITAL SIGNS: ED Triage Vitals  Enc Vitals Group     BP 03/31/19 1346 (!) 159/85     Pulse Rate 03/31/19 1346 64     Resp 03/31/19 1346 20     Temp 03/31/19 1346 97.8 F (36.6 C)  Temp Source 03/31/19 1346 Oral     SpO2 03/31/19 1346 97 %     Weight 03/31/19 1347 230 lb (104.3 kg)     Height 03/31/19 1347 5\' 7"  (1.702 m)     Head Circumference --      Peak Flow --      Pain Score 03/31/19 1347 0     Pain Loc --      Pain Edu? --      Excl. in [place]? --      Constitutional: Alert and oriented.  Patient is able to speak in complete sentences.  He does appear short of breath. Eyes: Conjunctivae are normal. PERRL. EOMI. Head: Atraumatic. ENT:      Nose: No  congestion/rhinnorhea.      Mouth/Throat: Mucous membranes are moist.  Neck: No stridor.  No cervical spine tenderness to palpation. Cardiovascular: Normal rate, regular rhythm. Normal S1 and S2.  Good peripheral circulation. Respiratory: Normal respiratory effort without tachypnea or retractions. Lungs CTAB.  Diminished breath sounds on the left. Gastrointestinal: Bowel sounds 4 quadrants. Soft and nontender to palpation. No guarding or rigidity. No palpable masses. No distention. No CVA tenderness. Musculoskeletal: Full range of motion to all extremities. No gross deformities appreciated. Neurologic:  Normal speech and language. No gross focal neurologic deficits are appreciated.  Skin:  Skin is warm, dry and intact. No rash noted. Psychiatric: Mood and affect are normal. Speech and behavior are normal. Patient exhibits appropriate insight and judgement.   ____________________________________________   LABS (all labs ordered are listed, but only abnormal results are displayed)  Labs Reviewed  TROPONIN I - Abnormal; Notable for the following components:      Result Value   Troponin I 0.03 (*)    All other components within normal limits  COMPREHENSIVE METABOLIC PANEL - Abnormal; Notable for the following components:   Calcium 8.6 (*)    Total Protein 6.2 (*)    Total Bilirubin 1.4 (*)    GFR calc non Af Amer 57 (*)    All other components within normal limits  SARS CORONAVIRUS 2 (HOSPITAL ORDER, [place] [facility])  CBC WITH DIFFERENTIAL/PLATELET   ____________________________________________  EKG  Ventricular rate 80 bpm.  Ventricular paced rhythm. ____________________________________________  RADIOLOGY I personally viewed and evaluated these images as part of my medical decision making, as well as reviewing the written report by the radiologist.  Dg Chest 2 View  Result Date: 03/31/2019 CLINICAL DATA:  Shortness of breath for the past 3 weeks. EXAM:  CHEST - 2 VIEW COMPARISON:  Chest x-ray dated November 04, 2018. FINDINGS: Unchanged left chest wall pacemaker. Stable cardiomegaly. Atherosclerotic calcification of the aortic arch. Normal pulmonary vascularity. New moderate left pleural effusion with adjacent lingular and left lower lobe atelectasis. The right lung is clear. No pneumothorax. No acute osseous abnormality. IMPRESSION: 1. New moderate left pleural effusion. Electronically Signed   By: Titus Dubin M.D.   On: 03/31/2019 12:33    ____________________________________________    PROCEDURES  Procedure(s) performed:    Procedures    Medications - No data to display   ____________________________________________   INITIAL IMPRESSION / ASSESSMENT AND PLAN / ED COURSE  Pertinent labs & imaging results that were available during my care of the patient were reviewed by me and considered in my medical decision making (see chart for details).  Review of the [place] CSRS was performed in accordance of the [place] prior to dispensing any controlled drugs.  Assessment and Plan: SOB: 85 year old male presents to the emergency department with progressive shortness of breath over the past 6 weeks.  On physical exam, patient was resting comfortably without use of accessory muscles for respiration.  He did seem short of breath after prolonged conversation.  He had diminished breath sounds on the left.  Differential diagnosis included CHF, community-acquired pneumonia, COVID-19, pleural effusions...  Chest x-ray reveals evidence of new left-sided pleural effusion. Covid 19 testing was negative.  EKG reveals paced rhythm. Troponin elevated at 0.03. Prime doc on-call, Dr. Brett Albino was consulted who accepted patient for admission  ----------------------------------------- 5:08 PM on 03/31/2019 -----------------------------------------  Dr. Tasia Catchings, oncologist on call was contacted to let her know that patient would be admitted.   Brent Hoover was evaluated in Emergency Department on 03/31/2019 for the symptoms described in the history of present illness. He was evaluated in the context of the global COVID-19 pandemic, which necessitated consideration that the patient might be at risk for infection with the SARS-CoV-2 virus that causes COVID-19. Institutional protocols and algorithms that pertain to the evaluation of patients at risk for COVID-19 are in a state of rapid change based on information released by regulatory bodies including the CDC and federal and state organizations. These policies and algorithms were followed during the patient's care in the ED.     ____________________________________________  FINAL CLINICAL IMPRESSION(S) / ED DIAGNOSES  Final diagnoses:  Shortness of breath  Pleural effusion      NEW MEDICATIONS STARTED DURING THIS VISIT:  ED Discharge Orders    None          This chart was dictated using voice recognition software/Dragon. Despite best efforts to proofread, errors can occur which can change the meaning. Any change was purely unintentional.    Karren Cobble 03/31/19 2127    Lavonia Drafts, MD 03/31/19 2136

## 2019-03-31 NOTE — ED Triage Notes (Signed)
Patient presents to the ED with increased shortness of breath over the past 2 months.  Patient went to [facility] today and was told to come to the ED due to a pleural effusion.  Patient states he feels great until he exerts himself and then becomes very short of breath.  Patient denies cough, congestion and fever.

## 2019-04-01 ENCOUNTER — Inpatient Hospital Stay
Admit: 2019-04-01 | Discharge: 2019-04-01 | Disposition: A | Discharge status: Home / Self Care | Payer: Medicare Other | Attending: Internal Medicine | Admitting: Internal Medicine

## 2019-04-01 DIAGNOSIS — C9110 Chronic lymphocytic leukemia of B-cell type not having achieved remission: Secondary | ICD-10-CM

## 2019-04-01 DIAGNOSIS — J90 Pleural effusion, not elsewhere classified: Principal | ICD-10-CM

## 2019-04-01 DIAGNOSIS — R0602 Shortness of breath: Secondary | ICD-10-CM

## 2019-04-01 LAB — CBC
HCT: 46.7 % (ref 39.0–52.0)
HCT: 45.4 % (ref 39.0–52.0)
Hemoglobin: 15.3 g/dL (ref 13.0–17.0)
Hemoglobin: 15.1 g/dL (ref 13.0–17.0)
MCH: 29 pg (ref 26.0–34.0)
MCH: 28.8 pg (ref 26.0–34.0)
MCHC: 32.8 g/dL (ref 30.0–36.0)
MCHC: 33.3 g/dL (ref 30.0–36.0)
MCV: 88.4 fL (ref 80.0–100.0)
MCV: 86.5 fL (ref 80.0–100.0)
Platelets: 160 10*3/uL (ref 150–400)
Platelets: 153 10*3/uL (ref 150–400)
RBC: 5.28 MIL/uL (ref 4.22–5.81)
RBC: 5.25 MIL/uL (ref 4.22–5.81)
RDW: 13.2 % (ref 11.5–15.5)
RDW: 13.2 % (ref 11.5–15.5)
WBC: 6.8 10*3/uL (ref 4.0–10.5)
WBC: 6.3 10*3/uL (ref 4.0–10.5)
nRBC: 0 % (ref 0.0–0.2)
nRBC: 0 % (ref 0.0–0.2)

## 2019-04-01 LAB — CREATININE, SERUM
Creatinine, Ser: 1.15 mg/dL (ref 0.61–1.24)
GFR calc Af Amer: >60 mL/min (ref >60)
GFR calc non Af Amer: 57 mL/min — ABNORMAL LOW (ref >60)

## 2019-04-01 LAB — BASIC METABOLIC PANEL
Anion gap: 9 (ref 5–15)
BUN: 16 mg/dL (ref 8–23)
CO2: 24 mmol/L (ref 22–32)
Calcium: 8.6 mg/dL — ABNORMAL LOW (ref 8.9–10.3)
Chloride: 107 mmol/L (ref 98–111)
Creatinine, Ser: 1.09 mg/dL (ref 0.61–1.24)
GFR calc Af Amer: >60 mL/min (ref >60)
GFR calc non Af Amer: >60 mL/min (ref >60)
Glucose, Bld: 95 mg/dL (ref 70–99)
Potassium: 4.1 mmol/L (ref 3.5–5.1)
Sodium: 140 mmol/L (ref 135–145)

## 2019-04-01 LAB — TROPONIN I
Troponin I: 0.03 ng/mL (ref <0.03)
Troponin I: 0.04 ng/mL (ref <0.03)

## 2019-04-01 MED ORDER — ACETAMINOPHEN 650 MG RE SUPP: 650.0000 mg | Freq: Four times a day (QID) | RECTAL | Status: DC | PRN

## 2019-04-01 MED ORDER — OXYCODONE HCL 5 MG PO TABS: 5.0000 mg | ORAL_TABLET | ORAL | Status: DC | PRN

## 2019-04-01 MED ORDER — ACETAMINOPHEN 325 MG PO TABS: 650.0000 mg | ORAL_TABLET | Freq: Four times a day (QID) | ORAL | Status: DC | PRN

## 2019-04-01 MED ORDER — TAMSULOSIN HCL 0.4 MG PO CAPS
0.4000 mg | ORAL_CAPSULE | Freq: Every day | ORAL | Status: DC
  Administered 2019-04-01 – 2019-04-03 (×3): 0.4 mg via ORAL
  Filled 2019-04-01 (×3): qty 1

## 2019-04-01 MED ORDER — ENOXAPARIN SODIUM 40 MG/0.4ML ~~LOC~~ SOLN
40.0000 mg | SUBCUTANEOUS | Status: DC
  Administered 2019-04-01 – 2019-04-03 (×3): 40 mg via SUBCUTANEOUS
  Filled 2019-04-01 (×3): qty 0.4

## 2019-04-01 MED ORDER — ONDANSETRON HCL 4 MG/2ML IJ SOLN: 4.0000 mg | Freq: Four times a day (QID) | INTRAMUSCULAR | Status: DC | PRN

## 2019-04-01 MED ORDER — ONDANSETRON HCL 4 MG PO TABS: 4.0000 mg | ORAL_TABLET | Freq: Four times a day (QID) | ORAL | Status: DC | PRN

## 2019-04-01 NOTE — Progress Notes (Signed)
[place] at [place] NAME: Brent Hoover    MR#:  [id number]  DATE OF BIRTH:  01-15-1930  SUBJECTIVE:   he presented to the hospital due to shortness of breath and noted to have a left-sided pleural effusion.  Still complaining of some exertional dyspnea.  No fever, cough or any other associated symptoms.  REVIEW OF SYSTEMS:    Review of Systems  Constitutional: Negative for chills and fever.  HENT: Negative for congestion and tinnitus.   Eyes: Negative for blurred vision and double vision.  Respiratory: Positive for shortness of breath. Negative for cough and wheezing.   Cardiovascular: Negative for chest pain, orthopnea and PND.  Gastrointestinal: Negative for abdominal pain, diarrhea, nausea and vomiting.  Genitourinary: Negative for dysuria and hematuria.  Neurological: Negative for dizziness, sensory change and focal weakness.  All other systems reviewed and are negative.   Nutrition: Heart Healthy Tolerating Diet: Yes Tolerating PT: Ambulatory  DRUG ALLERGIES:   Allergies  Allergen Reactions   Penicillins Other (See Comments)    Has patient had a PCN reaction causing immediate rash, facial/tongue/throat swelling, SOB or lightheadedness with hypotension: Unknown Has patient had a PCN reaction causing severe rash involving mucus membranes or skin necrosis: Unknown Has patient had a PCN reaction that required hospitalization: Unknown Has patient had a PCN reaction occurring within the last 10 years: Unknown If all of the above answers are "NO", then may proceed with Cephalosporin use.    VITALS:  Blood pressure (!) 110/50, pulse 62, temperature 97.9 F (36.6 C), temperature source Oral, resp. rate 16, height 5\' 7"  (1.702 m), weight 102.2 kg, SpO2 97 %.  PHYSICAL EXAMINATION:   Physical Exam  GENERAL:  85 y.o.-year-old patient lying in bed in no acute distress.  EYES: Pupils equal, round, reactive to light and  accommodation. No scleral icterus. Extraocular muscles intact.  HEENT: Head atraumatic, normocephalic. Oropharynx and nasopharynx clear.  NECK:  Supple, no jugular venous distention. No thyroid enlargement, no tenderness.  LUNGS: Crackles and poor a/e of left mid lung fields, no wheezing, rhonchi. No use of accessory muscles of respiration.  CARDIOVASCULAR: S1, S2 normal. No murmurs, rubs, or gallops.  ABDOMEN: Soft, nontender, nondistended. Bowel sounds present. No organomegaly or mass.  EXTREMITIES: No cyanosis, clubbing or edema b/l.    NEUROLOGIC: Cranial nerves II through XII are intact. No focal Motor or sensory deficits b/l.   PSYCHIATRIC: The patient is alert and oriented x 3.  SKIN: No obvious rash, lesion, or ulcer.    LABORATORY PANEL:   CBC Recent Labs  Lab 04/01/19 0600  WBC 6.3  HGB 15.1  HCT 45.4  PLT 153   ------------------------------------------------------------------------------------------------------------------  Chemistries  Recent Labs  Lab 03/31/19 1628  04/01/19 0600  NA 137  --  140  K 4.3  --  4.1  CL 105  --  107  CO2 23  --  24  GLUCOSE 98  --  95  BUN 17  --  16  CREATININE 1.16   < > 1.09  CALCIUM 8.6*  --  8.6*  AST 19  --   --   ALT 14  --   --   ALKPHOS 46  --   --   BILITOT 1.4*  --   --    < > = values in this interval not displayed.   ------------------------------------------------------------------------------------------------------------------  Cardiac Enzymes Recent Labs  Lab 04/01/19 0600  TROPONINI 0.04*   ------------------------------------------------------------------------------------------------------------------  RADIOLOGY:  Dg Chest 2 View  Result Date: 03/31/2019 CLINICAL DATA:  Shortness of breath for the past 3 weeks. EXAM: CHEST - 2 VIEW COMPARISON:  Chest x-ray dated November 04, 2018. FINDINGS: Unchanged left chest wall pacemaker. Stable cardiomegaly. Atherosclerotic calcification of the aortic arch.  Normal pulmonary vascularity. New moderate left pleural effusion with adjacent lingular and left lower lobe atelectasis. The right lung is clear. No pneumothorax. No acute osseous abnormality. IMPRESSION: 1. New moderate left pleural effusion. Electronically Signed   By: Titus Dubin M.D.   On: 03/31/2019 12:33     ASSESSMENT AND PLAN:   85 year old male with past medical history of CLL, hypertension, hyperlipidemia, gout, history of thrombocytopenia who presents to the hospital due to shortness of breath and noted to have a left-sided pleural effusion.  1.  Shortness of breath-secondary to a moderate left-sided pleural effusion. - Etiology of effusion unclear.  Plan for diagnostic/therapeutic thoracentesis on Monday. -Continue supportive care for now.  2.  Pleural effusion- etiology unclear.  Clinically patient does not appear to be in congestive heart failure. - Unclear if this is related to patient's underlying malignancy.  As per oncology this is unlikely. -Await diagnostic/therapeutic thoracentesis.  3. BPH - no urinary retention  - cont. Flomax.   4. Hx of CLL - previously treated, last treatment with Gazyva in 2018.  Currently on surveillance. - await flow cytometry on the pleural fluid.    All the records are reviewed and case discussed with Care Management/Social Worker. Management plans discussed with the patient, family and they are in agreement.  CODE STATUS: Full code  DVT Prophylaxis: Lovenox  TOTAL TIME TAKING CARE OF THIS PATIENT: 30 minutes.   POSSIBLE D/C IN 2-3 DAYS, DEPENDING ON CLINICAL CONDITION.   Henreitta Leber M.D on 04/01/2019 at 1:41 PM  Between 7am to 6pm - Pager - [phone]  After 6pm go to [email or web address] - Proofreader  [facility]  Office  [phone]  CC: Primary care physician; System, Provider Not In

## 2019-04-01 NOTE — Consult Note (Signed)
Hematology/Oncology Consult note [facility] [phone] [phone]  Patient Care Team: System, Provider Not In as PCP - General Cammie Sickle, MD as Consulting Physician (Internal Medicine)   Name of the patient: Brent Hoover  [id number]  Sep 12, 1930   Date of visit: 04/01/19 REASON FOR COSULTATION:  CLL, new left pleural effusion. History of presenting illness-  85 y.o. male with PMH listed at below who presents to ER for evaluation of shortness of breath.  Patient has a history of CLL I GVH mutated which was treated by Dr.Brahmanday, last treatment October 2018 [facility], currently on surveillance.  He lives close to [facility] and is in the process of switching care to [name].  He was getting labs done and lab personnel noticed patient has shallow breathing and recommend patient to go to emergency room for further evaluation.  Shortness of breath onset is chronic, patient reports recently not able to walk even a very short distance or up a flight of stairs without becoming shortness of breath. X-ray in ED showed new moderate left pleural effusion.  Patient had x-ray on 12/01/2018 which did not reveal any pleural effusion.  Lab work also showed elevated troponin 0.03, series monitoring showed slight increase of troponin II 0.04 this morning.  Echo is being done at bedside this morning.  Results pending. Patient reports feeling tired recently.  No unintentional weight loss.  Mild sweats during sleep. Today he feels breathing is better.  Denies any chest pain, fever, chills, nausea, vomiting chest pain, abdominal pain. Patient had a pacemaker which was placed in [facility] healthcare due to bradycardia.  Review of Systems  Constitutional: Positive for appetite change and fatigue. Negative for unexpected weight change.  Respiratory: Positive for shortness of breath.   Genitourinary: Negative for frequency.   Musculoskeletal: Negative for neck pain.   Skin: Negative for itching and rash.  Hematological: Negative for adenopathy.  Psychiatric/Behavioral: Negative for confusion.    Allergies  Allergen Reactions  . Penicillins Other (See Comments)    Has patient had a PCN reaction causing immediate rash, facial/tongue/throat swelling, SOB or lightheadedness with hypotension: Unknown Has patient had a PCN reaction causing severe rash involving mucus membranes or skin necrosis: Unknown Has patient had a PCN reaction that required hospitalization: Unknown Has patient had a PCN reaction occurring within the last 10 years: Unknown If all of the above answers are "NO", then may proceed with Cephalosporin use.    Patient Active Problem List   Diagnosis Date Noted  . HLD (hyperlipidemia) 03/31/2019  . HTN (hypertension) 03/31/2019  . Pleural effusion, left 03/31/2019  . CKD (chronic kidney disease), stage III ([place]) 03/31/2019  . Elevated troponin 03/31/2019  . Hyponatremia 05/20/2018  . Splenomegaly 07/27/2017  . CLL (chronic lymphocytic leukemia) ([place]) 04/30/2015     Past Medical History:  Diagnosis Date  . Bladder neck obstruction   . Cataracts, bilateral   . Chronic lymphocytic leukemia (CLL), B-cell ([facility])    Lambda positive B-cell CLL  . Gout   . Hypercholesterolemia   . Hypertension   . Lymphocytosis   . Sleep apnea   . Thrombocytopenia ([place])      Past Surgical History:  Procedure Laterality Date  . COLONOSCOPY WITH PROPOFOL N/A 09/10/2015   Procedure: COLONOSCOPY WITH PROPOFOL;  Surgeon: Lucilla Lame, MD;  Location: [facility] ENDOSCOPY;  Service: Endoscopy;  Laterality: N/A;  . PROSTATE SURGERY  2008    Social History   Socioeconomic History  . Marital status: Married  Spouse name: Not on file  . Number of children: Not on file  . Years of education: Not on file  . Highest education level: Not on file  Occupational History  . Not on file  Social Needs  . Financial resource strain: Not on file  . Food  insecurity:    Worry: Not on file    Inability: Not on file  . Transportation needs:    Medical: Not on file    Non-medical: Not on file  Tobacco Use  . Smoking status: Never Smoker  . Smokeless tobacco: Never Used  Substance and Sexual Activity  . Alcohol use: No    Alcohol/week: 0.0 standard drinks  . Drug use: No  . Sexual activity: Not on file  Lifestyle  . Physical activity:    Days per week: Not on file    Minutes per session: Not on file  . Stress: Not on file  Relationships  . Social connections:    Talks on phone: Not on file    Gets together: Not on file    Attends religious service: Not on file    Active member of club or organization: Not on file    Attends meetings of clubs or organizations: Not on file    Relationship status: Not on file  . Intimate partner violence:    Fear of current or ex partner: Not on file    Emotionally abused: Not on file    Physically abused: Not on file    Forced sexual activity: Not on file  Other Topics Concern  . Not on file  Social History Narrative  . Not on file     Family History  Problem Relation Age of Onset  . Lung cancer Mother 23       deceased  . Brain cancer Son 30       pt's only son  . Polycythemia Brother        half brother  . Prostate cancer Brother      Current Facility-Administered Medications:  .  acetaminophen (TYLENOL) tablet 650 mg, 650 mg, Oral, Q6H PRN **OR** acetaminophen (TYLENOL) suppository 650 mg, 650 mg, Rectal, Q6H PRN, Lance Coon, MD .  enoxaparin (LOVENOX) injection 40 mg, 40 mg, Subcutaneous, Q24H, Lance Coon, MD, 40 mg at 04/01/19 0859 .  ondansetron (ZOFRAN) tablet 4 mg, 4 mg, Oral, Q6H PRN **OR** ondansetron (ZOFRAN) injection 4 mg, 4 mg, Intravenous, Q6H PRN, Lance Coon, MD .  oxyCODONE (Oxy IR/ROXICODONE) immediate release tablet 5 mg, 5 mg, Oral, Q4H PRN, Lance Coon, MD .  tamsulosin [facility]) capsule 0.4 mg, 0.4 mg, Oral, Daily, Lance Coon, MD, 0.4 mg at 04/01/19  [id number]   Physical exam: Vitals:   03/31/19 1942 03/31/19 2229 04/01/19 0035 04/01/19 0525  BP: (!) 162/101 (!) 164/97 (!) 143/80 (!) 110/50  Pulse: 81 62 60 62  Resp: 20 18 15 16   Temp: 97.8 F (36.6 C)  97.9 F (36.6 C) 97.9 F (36.6 C)  TempSrc: Oral  Oral Oral  SpO2: 98% 98% 99% 97%  Weight:   225 lb 4.8 oz (102.2 kg)   Height:   5\' 7"  (1.702 m)    Physical Exam  Constitutional: He is oriented to person, place, and time. No distress.  Lying in bed with no acute distress.  HENT:  Head: Normocephalic and atraumatic.  Eyes: Pupils are equal, round, and reactive to light. EOM are normal.  Neck: Normal range of motion. Neck supple.  Cardiovascular: Normal rate and  regular rhythm.  No murmur heard. Pulmonary/Chest: Effort normal. No respiratory distress.  Decreased breath sound left base.  Abdominal: Soft. He exhibits no distension. There is no abdominal tenderness.  Musculoskeletal: Normal range of motion.        General: No edema.  Neurological: He is alert and oriented to person, place, and time. He exhibits normal muscle tone.  Skin: Skin is warm and dry. He is not diaphoretic.  Psychiatric: Affect normal.        CMP Latest Ref Rng & Units 04/01/2019  Glucose 70 - 99 mg/dL 95  BUN 8 - 23 mg/dL 16  Creatinine 0.61 - 1.24 mg/dL 1.09  Sodium 135 - 145 mmol/L 140  Potassium 3.5 - 5.1 mmol/L 4.1  Chloride 98 - 111 mmol/L 107  CO2 22 - 32 mmol/L 24  Calcium 8.9 - 10.3 mg/dL 8.6(L)  Total Protein 6.5 - 8.1 g/dL -  Total Bilirubin 0.3 - 1.2 mg/dL -  Alkaline Phos 38 - 126 U/L -  AST 15 - 41 U/L -  ALT 0 - 44 U/L -   CBC Latest Ref Rng & Units 04/01/2019  WBC 4.0 - 10.5 K/uL 6.3  Hemoglobin 13.0 - 17.0 g/dL 15.1  Hematocrit 39.0 - 52.0 % 45.4  Platelets 150 - 400 K/uL 153   RADIOGRAPHIC STUDIES: I have personally reviewed the radiological images as listed and agreed with the findings in the report.  Dg Chest 2 View  Result Date: 03/31/2019 CLINICAL DATA:   Shortness of breath for the past 3 weeks. EXAM: CHEST - 2 VIEW COMPARISON:  Chest x-ray dated November 04, 2018. FINDINGS: Unchanged left chest wall pacemaker. Stable cardiomegaly. Atherosclerotic calcification of the aortic arch. Normal pulmonary vascularity. New moderate left pleural effusion with adjacent lingular and left lower lobe atelectasis. The right lung is clear. No pneumothorax. No acute osseous abnormality. IMPRESSION: 1. New moderate left pleural effusion. Electronically Signed   By: Titus Dubin M.D.   On: 03/31/2019 12:33    Assessment and plan- Patient is a 85 y.o. male with past medical history including sleep apnea, CLL, hypertension, hypercholesterolemia, gout, bradycardia/A. fib presented to emergency room for evaluation of shortness of breath.  Chest x-ray found new onset left moderate pleural effusion.  #Left moderate pleural effusion, new onset, etiology unknown. Echo pending.  Recommend diagnostic and therapeutic thoracentesis.  # CLL, previously treated, last treatment with Gazyva in 2018.  Currently on surveillance. Labs are reviewed.  WBC 6.3 with normal differential LDH 159, less likely transformation from CLL to more aggressive lymphoma. Recommend checking pleural effusion flow cytometry to see if lymphoma is involved.  # History of PSA elevation. Followed up with Dr.Wolfe previously. No recent PSA. I will obtain one.    Thank you for allowing me to participate in the care of this patient.  Total face to face encounter time for this patient visit was 70 min. >50% of the time was  spent in counseling and coordination of care.    Earlie Server, MD, PhD Hematology Oncology [facility] at [facility] Pager- [phone] 04/01/2019

## 2019-04-02 LAB — ECHOCARDIOGRAM COMPLETE
Height: 67 in
Weight: 3604.8 oz

## 2019-04-02 LAB — PSA: Prostatic Specific Antigen: 4.33 ng/mL — ABNORMAL HIGH (ref 0.00–4.00)

## 2019-04-02 NOTE — Progress Notes (Signed)
[place] at [place] NAME: Brent Hoover    MR#:  [id number]  DATE OF BIRTH:  11-Oct-1930  SUBJECTIVE:   No acute events overnight, plan for ultrasound-guided thoracentesis tomorrow.  No worsening shortness of breath.  REVIEW OF SYSTEMS:    Review of Systems  Constitutional: Negative for chills and fever.  HENT: Negative for congestion and tinnitus.   Eyes: Negative for blurred vision and double vision.  Respiratory: Positive for shortness of breath. Negative for cough and wheezing.   Cardiovascular: Negative for chest pain, orthopnea and PND.  Gastrointestinal: Negative for abdominal pain, diarrhea, nausea and vomiting.  Genitourinary: Negative for dysuria and hematuria.  Neurological: Negative for dizziness, sensory change and focal weakness.  All other systems reviewed and are negative.   Nutrition: Heart Healthy Tolerating Diet: Yes Tolerating PT: Ambulatory  DRUG ALLERGIES:   Allergies  Allergen Reactions  . Penicillins Other (See Comments)    Has patient had a PCN reaction causing immediate rash, facial/tongue/throat swelling, SOB or lightheadedness with hypotension: Unknown Has patient had a PCN reaction causing severe rash involving mucus membranes or skin necrosis: Unknown Has patient had a PCN reaction that required hospitalization: Unknown Has patient had a PCN reaction occurring within the last 10 years: Unknown If all of the above answers are "NO", then may proceed with Cephalosporin use.    VITALS:  Blood pressure (!) 143/80, pulse (!) 59, temperature 98.5 F (36.9 C), temperature source Oral, resp. rate 15, height 5\' 7"  (1.702 m), weight 102.2 kg, SpO2 97 %.  PHYSICAL EXAMINATION:   Physical Exam  GENERAL:  85 y.o.-year-old patient lying in bed in no acute distress.  EYES: Pupils equal, round, reactive to light and accommodation. No scleral icterus. Extraocular muscles intact.  HEENT: Head atraumatic,  normocephalic. Oropharynx and nasopharynx clear.  NECK:  Supple, no jugular venous distention. No thyroid enlargement, no tenderness.  LUNGS: Crackles and poor a/e of left mid/lower lung fields, no wheezing, rhonchi. No use of accessory muscles of respiration.  CARDIOVASCULAR: S1, S2 normal. No murmurs, rubs, or gallops.  ABDOMEN: Soft, nontender, nondistended. Bowel sounds present. No organomegaly or mass.  EXTREMITIES: No cyanosis, clubbing or edema b/l.    NEUROLOGIC: Cranial nerves II through XII are intact. No focal Motor or sensory deficits b/l.   PSYCHIATRIC: The patient is alert and oriented x 3.  SKIN: No obvious rash, lesion, or ulcer.    LABORATORY PANEL:   CBC Recent Labs  Lab 04/01/19 0600  WBC 6.3  HGB 15.1  HCT 45.4  PLT 153   ------------------------------------------------------------------------------------------------------------------  Chemistries  Recent Labs  Lab 03/31/19 1628  04/01/19 0600  NA 137  --  140  K 4.3  --  4.1  CL 105  --  107  CO2 23  --  24  GLUCOSE 98  --  95  BUN 17  --  16  CREATININE 1.16   < > 1.09  CALCIUM 8.6*  --  8.6*  AST 19  --   --   ALT 14  --   --   ALKPHOS 46  --   --   BILITOT 1.4*  --   --    < > = values in this interval not displayed.   ------------------------------------------------------------------------------------------------------------------  Cardiac Enzymes Recent Labs  Lab 04/01/19 0600  TROPONINI 0.04*   ------------------------------------------------------------------------------------------------------------------  RADIOLOGY:  No results found.   ASSESSMENT AND PLAN:   85 year old male with past medical history  of CLL, hypertension, hyperlipidemia, gout, history of thrombocytopenia who presents to the hospital due to shortness of breath and noted to have a left-sided pleural effusion.  1.  Shortness of breath-secondary to a moderate left-sided pleural effusion. - Etiology of effusion  unclear.  Plan for diagnostic/therapeutic thoracentesis tomorrow. -Continue supportive care for now.  2.  Pleural effusion- etiology unclear.  Clinically patient does not appear to be in congestive heart failure. - Unclear if this is related to patient's underlying malignancy.  As per oncology this is unlikely. -Await diagnostic/therapeutic thoracentesis tomorrow.  3. BPH - no urinary retention  - cont. Flomax.   4. Hx of CLL - previously treated, last treatment with Gazyva in 2018.  Currently on surveillance. - await flow cytometry on the pleural fluid.    5. Elevated Trop - due to supply demand ischemia.   - no acute symptoms.   All the records are reviewed and case discussed with Care Management/Social Worker. Management plans discussed with the patient, family and they are in agreement.  CODE STATUS: Full code  DVT Prophylaxis: Lovenox  TOTAL TIME TAKING CARE OF THIS PATIENT: 30 minutes.   POSSIBLE D/C IN 1-2 DAYS, DEPENDING ON CLINICAL CONDITION.   Henreitta Leber M.D on 04/02/2019 at 1:35 PM  Between 7am to 6pm - Pager - [phone]  After 6pm go to [email or web address] - Proofreader  [facility] [place] [facility]  Office  [phone]  CC: Primary care physician; System, Provider Not In

## 2019-04-03 ENCOUNTER — Inpatient Hospital Stay: Payer: Medicare Other

## 2019-04-03 ENCOUNTER — Inpatient Hospital Stay: Payer: Medicare Other | Attending: Hematology and Oncology | Admitting: Hematology and Oncology

## 2019-04-03 ENCOUNTER — Other Ambulatory Visit: Payer: Medicare Other

## 2019-04-03 DIAGNOSIS — E7800 Pure hypercholesterolemia, unspecified: Secondary | ICD-10-CM | POA: Insufficient documentation

## 2019-04-03 DIAGNOSIS — J90 Pleural effusion, not elsewhere classified: Secondary | ICD-10-CM | POA: Insufficient documentation

## 2019-04-03 DIAGNOSIS — C9110 Chronic lymphocytic leukemia of B-cell type not having achieved remission: Secondary | ICD-10-CM | POA: Diagnosis not present

## 2019-04-03 DIAGNOSIS — G4730 Sleep apnea, unspecified: Secondary | ICD-10-CM | POA: Diagnosis not present

## 2019-04-03 DIAGNOSIS — I10 Essential (primary) hypertension: Secondary | ICD-10-CM | POA: Diagnosis not present

## 2019-04-03 DIAGNOSIS — I129 Hypertensive chronic kidney disease with stage 1 through stage 4 chronic kidney disease, or unspecified chronic kidney disease: Secondary | ICD-10-CM

## 2019-04-03 DIAGNOSIS — Z79899 Other long term (current) drug therapy: Secondary | ICD-10-CM | POA: Diagnosis not present

## 2019-04-03 DIAGNOSIS — I4891 Unspecified atrial fibrillation: Secondary | ICD-10-CM

## 2019-04-03 DIAGNOSIS — R001 Bradycardia, unspecified: Secondary | ICD-10-CM

## 2019-04-03 DIAGNOSIS — N183 Chronic kidney disease, stage 3 (moderate): Secondary | ICD-10-CM

## 2019-04-03 DIAGNOSIS — Z950 Presence of cardiac pacemaker: Secondary | ICD-10-CM

## 2019-04-03 LAB — GLUCOSE, PLEURAL OR PERITONEAL FLUID: Glucose, Fluid: 87 mg/dL

## 2019-04-03 LAB — BODY FLUID CELL COUNT WITH DIFFERENTIAL
Eos, Fluid: 2 %
Lymphs, Fluid: 90 %
Monocyte-Macrophage-Serous Fluid: 6 %
Neutrophil Count, Fluid: 2 %
Other Cells, Fluid: 0 %
Total Nucleated Cell Count, Fluid: 888 cu mm

## 2019-04-03 LAB — PROTEIN, PLEURAL OR PERITONEAL FLUID: Total protein, fluid: <3 g/dL

## 2019-04-03 MED ORDER — IOHEXOL 300 MG/ML  SOLN
75.0000 mL | Freq: Once | INTRAMUSCULAR | Status: AC | PRN
  Administered 2019-04-03: 13:00:00 75 mL via INTRAVENOUS

## 2019-04-03 MED ORDER — LISINOPRIL 20 MG PO TABS
20.0000 mg | ORAL_TABLET | Freq: Every day | ORAL | Status: DC
  Administered 2019-04-03: 20 mg via ORAL
  Filled 2019-04-03: qty 1

## 2019-04-03 NOTE — Progress Notes (Signed)
[place] at [place] NAME: Brent Hoover    MR#:  [id number]  DATE OF BIRTH:  07-01-1930  SUBJECTIVE:   No acute events overnight, plan for ultrasound-guided thoracentesis today.  No worsening shortness of breath.  REVIEW OF SYSTEMS:    Review of Systems  Constitutional: Negative for chills and fever.  HENT: Negative for congestion and tinnitus.   Eyes: Negative for blurred vision and double vision.  Respiratory: Positive for shortness of breath. Negative for cough and wheezing.   Cardiovascular: Negative for chest pain, orthopnea and PND.  Gastrointestinal: Negative for abdominal pain, diarrhea, nausea and vomiting.  Genitourinary: Negative for dysuria and hematuria.  Neurological: Negative for dizziness, sensory change and focal weakness.  All other systems reviewed and are negative.   Nutrition: Heart Healthy Tolerating Diet: Yes Tolerating PT: Ambulatory  DRUG ALLERGIES:   Allergies  Allergen Reactions   Penicillins Other (See Comments)    Has patient had a PCN reaction causing immediate rash, facial/tongue/throat swelling, SOB or lightheadedness with hypotension: Unknown Has patient had a PCN reaction causing severe rash involving mucus membranes or skin necrosis: Unknown Has patient had a PCN reaction that required hospitalization: Unknown Has patient had a PCN reaction occurring within the last 10 years: Unknown If all of the above answers are "NO", then may proceed with Cephalosporin use.    VITALS:  Blood pressure (!) 170/91, pulse 60, temperature 97.7 F (36.5 C), temperature source Oral, resp. rate 17, height 5\' 7"  (1.702 m), weight 102.2 kg, SpO2 96 %.  PHYSICAL EXAMINATION:   Physical Exam  GENERAL:  85 y.o.-year-old patient lying in bed in no acute distress.  EYES: Pupils equal, round, reactive to light and accommodation. No scleral icterus. Extraocular muscles intact.  HEENT: Head atraumatic,  normocephalic. Oropharynx and nasopharynx clear.  NECK:  Supple, no jugular venous distention. No thyroid enlargement, no tenderness.  LUNGS: Crackles and poor a/e of left mid/lower lung fields, no wheezing, rhonchi. No use of accessory muscles of respiration.  CARDIOVASCULAR: S1, S2 normal. No murmurs, rubs, or gallops.  ABDOMEN: Soft, nontender, nondistended. Bowel sounds present. No organomegaly or mass.  EXTREMITIES: No cyanosis, clubbing or edema b/l.    NEUROLOGIC: Cranial nerves II through XII are intact. No focal Motor or sensory deficits b/l.   PSYCHIATRIC: The patient is alert and oriented x 3.  SKIN: No obvious rash, lesion, or ulcer.    LABORATORY PANEL:   CBC Recent Labs  Lab 04/01/19 0600  WBC 6.3  HGB 15.1  HCT 45.4  PLT 153   ------------------------------------------------------------------------------------------------------------------  Chemistries  Recent Labs  Lab 03/31/19 1628  04/01/19 0600  NA 137  --  140  K 4.3  --  4.1  CL 105  --  107  CO2 23  --  24  GLUCOSE 98  --  95  BUN 17  --  16  CREATININE 1.16   < > 1.09  CALCIUM 8.6*  --  8.6*  AST 19  --   --   ALT 14  --   --   ALKPHOS 46  --   --   BILITOT 1.4*  --   --    < > = values in this interval not displayed.   ------------------------------------------------------------------------------------------------------------------  Cardiac Enzymes Recent Labs  Lab 04/01/19 0600  TROPONINI 0.04*   ------------------------------------------------------------------------------------------------------------------  RADIOLOGY:  Ct Chest W Contrast  Result Date: 04/03/2019 CLINICAL DATA:  Left-sided chest pain. Difficulty breathing for  3 months. EXAM: CT CHEST WITH CONTRAST TECHNIQUE: Multidetector CT imaging of the chest was performed during intravenous contrast administration. CONTRAST:  11mL OMNIPAQUE IOHEXOL 300 MG/ML  SOLN COMPARISON:  CT cap 08/18/2011 and chest radiograph 04/03/2019  FINDINGS: Cardiovascular: The heart size appears within normal limits. Trace pericardial effusion. Aortic atherosclerosis. Calcifications within the left main, lad, left circumflex and RCA coronary arteries noted. Mediastinum/Nodes: No enlarged mediastinal, hilar, or axillary lymph nodes. Thyroid gland, trachea, and esophagus demonstrate no significant findings. Lungs/Pleura: There is a moderate loculated left pleural effusion. Overlying compressive type atelectasis is identified within the lingula and left lower lobe. No discrete mass identified. Small subpleural nodule overlying the posterolateral right upper lobe is unchanged measuring 4 mm, image 55/3. Upper Abdomen: No acute abnormalities. Right upper pole kidney cysts noted. Musculoskeletal: Spondylosis within the thoracic spine. No aggressive lytic or sclerotic bone lesions. IMPRESSION: 1. Moderate volume loculated left pleural effusion is identified with overlying compressive type atelectasis. Etiology is indeterminate. Consider further evaluation with the diagnostic thoracentesis. 2.  Aortic Atherosclerosis (ICD10-I70.0). 3. Multi vessel coronary artery calcifications including left main disease. Electronically Signed   By: Kerby Moors M.D.   On: 04/03/2019 13:24   Dg Chest Port 1 View  Result Date: 04/03/2019 CLINICAL DATA:  Pleural effusion and shortness of breath. EXAM: PORTABLE CHEST 1 VIEW COMPARISON:  03/31/2019 FINDINGS: Similar radiographic appearance. The right chest is clear. There is a moderate to large left effusion with associated left lower lung atelectasis and or infiltrate. Single lead pacemaker remains evident. Aortic atherosclerosis remains evident. IMPRESSION: Similar appearance. Persistent left effusion with left lower lung atelectasis and or infiltrate Electronically Signed   By: Nelson Chimes M.D.   On: 04/03/2019 11:09     ASSESSMENT AND PLAN:   85 year old male with past medical history of CLL, hypertension,  hyperlipidemia, gout, history of thrombocytopenia who presents to the hospital due to shortness of breath and noted to have a left-sided pleural effusion.  1.  Shortness of breath-secondary to a moderate left-sided pleural effusion. - Etiology of effusion unclear.  Plan for diagnostic/therapeutic thoracentesis today. -Continue supportive care for now.  2.  Pleural effusion- etiology unclear.  Clinically patient does not appear to be in congestive heart failure. - Unclear if this is related to patient's underlying malignancy.  As per oncology this is unlikely. -Await diagnostic/therapeutic thoracentesis today.  3. BPH - no urinary retention  - cont. Flomax.   4. Hx of CLL - previously treated, last treatment with Gazyva in 2018.  Currently on surveillance. - await flow cytometry on the pleural fluid.  -Oncology recommended doing a CT of the chest which showed a loculated left-sided pleural effusion but no other masses noted.  5. Elevated Trop - due to supply demand ischemia.   - no acute symptoms.   Possible discharge later today after thoracentesis.  All the records are reviewed and case discussed with Care Management/Social Worker. Management plans discussed with the patient, family and they are in agreement.  CODE STATUS: Full code  DVT Prophylaxis: Lovenox  TOTAL TIME TAKING CARE OF THIS PATIENT: 30 minutes.   POSSIBLE D/C IN 1-2 DAYS, DEPENDING ON CLINICAL CONDITION.   Henreitta Leber M.D on 04/03/2019 at 2:14 PM  Between 7am to 6pm - Pager - [phone]  After 6pm go to [email or web address] - Proofreader  [facility] [place] [facility]  Office  [phone]  CC: Primary care physician; System, Provider Not In

## 2019-04-03 NOTE — Care Management Important Message (Signed)
Important Message  Patient Details  Name: Brent Hoover MRN: [id number] Date of Birth: Oct 27, 1930   Medicare Important Message Given:  Yes    Juliann Pulse A Glynis Hunsucker 04/03/2019, 11:38 AM

## 2019-04-03 NOTE — Procedures (Signed)
Interventional Radiology Procedure Note  Procedure: US guided left thoracentesis  Complications: None  Estimated Blood Loss: None  Findings: 900 mL of amber fluid removed from left pleural space. Post CXR pending.  Venetia Night. Kathlene Cote, M.D Pager:  [phone]

## 2019-04-03 NOTE — Progress Notes (Signed)
EMANUEL DOWSON   DOB:04-28-1930   UU#:[id number]    Subjective: Patient states his shortness of breath is improved.  He is awaiting to have his thoracentesis done later in the morning.  Objective:  Vitals:   04/03/19 1539 04/03/19 1600  BP: (!) 156/86   Pulse: 62   Resp: 17   Temp: 98 F (36.7 C)   SpO2: 98% 97%     Intake/Output Summary (Last 24 hours) at 04/03/2019 1641 Last data filed at 04/03/2019 1552 Gross per 24 hour  Intake 240 ml  Output 1300 ml  Net -1060 ml    Physical Exam  Constitutional: He is oriented to person, place, and time and well-developed, well-nourished, and in no distress.  HENT:  Head: Normocephalic and atraumatic.  Mouth/Throat: Oropharynx is clear and moist. No oropharyngeal exudate.  Eyes: Pupils are equal, round, and reactive to light.  Neck: Normal range of motion. Neck supple.  Cardiovascular: Normal rate and regular rhythm.  Pulmonary/Chest: No respiratory distress. He has no wheezes.  Decreased air entry bilaterally left more than right.  Abdominal: Soft. Bowel sounds are normal. He exhibits no distension and no mass. There is no abdominal tenderness. There is no rebound and no guarding.  Musculoskeletal: Normal range of motion.        General: No tenderness or edema.  Neurological: He is alert and oriented to person, place, and time.  Skin: Skin is warm.  Psychiatric: Affect normal.     Labs:  Lab Results  Component Value Date   WBC 6.3 04/01/2019   HGB 15.1 04/01/2019   HCT 45.4 04/01/2019   MCV 86.5 04/01/2019   PLT 153 04/01/2019   NEUTROABS 5.3 03/31/2019    Lab Results  Component Value Date   NA 140 04/01/2019   K 4.1 04/01/2019   CL 107 04/01/2019   CO2 24 04/01/2019    Studies:  Dg Chest 1 View  Result Date: 04/03/2019 CLINICAL DATA:  Status post left thoracentesis. EXAM: CHEST  1 VIEW COMPARISON:  Radiograph of same day. FINDINGS: Stable cardiomediastinal silhouette. Atherosclerosis of thoracic aorta is noted.  Left-sided pacemaker is unchanged. Right lung is clear. Left pleural effusion noted on prior exam appears to be nearly resolved. Mild left basilar pneumothorax is noted most likely due to incomplete re-expansion of the lung due to longstanding effusion and cortical thickening. Left basilar opacity remains consistent with scarring or atelectasis. Bony thorax is unremarkable. IMPRESSION: Left pleural effusion appears to be nearly resolved status post thoracentesis. Mild left basilar pneumothorax is noted most likely due to incomplete re-expansion of the lung due to longstanding effusion and cortical thickening. Aortic Atherosclerosis (ICD10-I70.0). Electronically Signed   By: Marijo Conception M.D.   On: 04/03/2019 15:22   Ct Chest W Contrast  Result Date: 04/03/2019 CLINICAL DATA:  Left-sided chest pain. Difficulty breathing for 3 months. EXAM: CT CHEST WITH CONTRAST TECHNIQUE: Multidetector CT imaging of the chest was performed during intravenous contrast administration. CONTRAST:  28mL OMNIPAQUE IOHEXOL 300 MG/ML  SOLN COMPARISON:  CT cap 08/18/2011 and chest radiograph 04/03/2019 FINDINGS: Cardiovascular: The heart size appears within normal limits. Trace pericardial effusion. Aortic atherosclerosis. Calcifications within the left main, lad, left circumflex and RCA coronary arteries noted. Mediastinum/Nodes: No enlarged mediastinal, hilar, or axillary lymph nodes. Thyroid gland, trachea, and esophagus demonstrate no significant findings. Lungs/Pleura: There is a moderate loculated left pleural effusion. Overlying compressive type atelectasis is identified within the lingula and left lower lobe. No discrete mass identified. Small subpleural nodule  overlying the posterolateral right upper lobe is unchanged measuring 4 mm, image 55/3. Upper Abdomen: No acute abnormalities. Right upper pole kidney cysts noted. Musculoskeletal: Spondylosis within the thoracic spine. No aggressive lytic or sclerotic bone lesions.  IMPRESSION: 1. Moderate volume loculated left pleural effusion is identified with overlying compressive type atelectasis. Etiology is indeterminate. Consider further evaluation with the diagnostic thoracentesis. 2.  Aortic Atherosclerosis (ICD10-I70.0). 3. Multi vessel coronary artery calcifications including left main disease. Electronically Signed   By: Kerby Moors M.D.   On: 04/03/2019 13:24   Dg Chest Port 1 View  Result Date: 04/03/2019 CLINICAL DATA:  Pleural effusion and shortness of breath. EXAM: PORTABLE CHEST 1 VIEW COMPARISON:  03/31/2019 FINDINGS: Similar radiographic appearance. The right chest is clear. There is a moderate to large left effusion with associated left lower lung atelectasis and or infiltrate. Single lead pacemaker remains evident. Aortic atherosclerosis remains evident. IMPRESSION: Similar appearance. Persistent left effusion with left lower lung atelectasis and or infiltrate Electronically Signed   By: Nelson Chimes M.D.   On: 04/03/2019 11:09   US Thoracentesis Asp Pleural Space W/img Guide  Result Date: 04/03/2019 CLINICAL DATA:  Left pleural effusion. EXAM: ULTRASOUND GUIDED LEFT THORACENTESIS COMPARISON:  None. PROCEDURE: An ultrasound guided thoracentesis was thoroughly discussed with the patient and questions answered. The benefits, risks, alternatives and complications were also discussed. The patient understands and wishes to proceed with the procedure. Written consent was obtained. Ultrasound was performed to localize and mark an adequate pocket of fluid in the left chest. The area was then prepped and draped in the normal sterile fashion. 1% Lidocaine was used for local anesthesia. Under ultrasound guidance a 6 French Safe-T-Centesis catheter was introduced. Thoracentesis was performed. The catheter was removed and a dressing applied. COMPLICATIONS: None FINDINGS: A total of approximately 900 mL of yellowish fluid was removed. A fluid sample was sent for laboratory  analysis. IMPRESSION: Successful ultrasound guided left thoracentesis yielding 900 mL of pleural fluid. Electronically Signed   By: Aletta Edouard M.D.   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  No evidence of clinical progression noted.  Clinically I do not think patient's pleural effusion [see description below] his underlying CLL.  #Left-sided pleural effusion-unclear etiology.  Awaiting thoracentesis this morning.  Recommend CT scan for further evaluation.  # Afib/bradycardia s/p PPM at [facility]-on aspirin.  Stable.  #Hypertension-stable continue lisinopril.  Stable  #CKD stage III creatinine 1.1; stable  #Disposition: If clinically stable patient could be discharged from hematology standpoint.  Patient is to follow-up with [name] in 1 week/at [place] office.   Addendum: CT scan shows moderate loculated left pleural effusion-NO underlying mass noted/or mediastinal/axillary adenopathy noted.  Await thoracentesis studies.     Cammie Sickle, MD 04/03/2019  4:41 PM

## 2019-04-03 NOTE — Assessment & Plan Note (Addendum)
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  No evidence of clinical progression noted.  Clinically I do not think patient's pleural effusion [see description below] his underlying CLL.  #Left-sided pleural effusion-unclear etiology.  Awaiting thoracentesis this morning.  Recommend CT scan for further evaluation.  # Afib/bradycardia s/p PPM at [facility]-on aspirin.  Stable.  #Hypertension-stable continue lisinopril.  Stable  #CKD stage III creatinine 1.1; stable  #Disposition: If clinically stable patient could be discharged from hematology standpoint.  Patient is to follow-up with [name] in 1 week/at [place] office.   Addendum: CT scan shows moderate loculated left pleural effusion-NO underlying mass noted/or mediastinal/axillary adenopathy noted.  Await thoracentesis studies.

## 2019-04-04 NOTE — Discharge Summary (Signed)
[facility] [place] NAME: Brent Hoover    MR#:  [id number]  DATE OF BIRTH:  1930-02-01  DATE OF ADMISSION:  03/31/2019 ADMITTING PHYSICIAN: Lance Coon, MD  DATE OF DISCHARGE: 04/03/2019  5:59 PM  PRIMARY CARE PHYSICIAN: System, Provider Not In    ADMISSION DIAGNOSIS:  Shortness of breath [R06.02] Pleural effusion [J90]  DISCHARGE DIAGNOSIS:  Principal Problem:   Pleural effusion, left Active Problems:   CLL (chronic lymphocytic leukemia) (HCC)   HLD (hyperlipidemia)   HTN (hypertension)   CKD (chronic kidney disease), stage III (HCC)   Elevated troponin   Shortness of breath   SECONDARY DIAGNOSIS:   Past Medical History:  Diagnosis Date  . Bladder neck obstruction   . Cataracts, bilateral   . Chronic lymphocytic leukemia (CLL), B-cell (HCC)    Lambda positive B-cell CLL  . Gout   . Hypercholesterolemia   . Hypertension   . Lymphocytosis   . Sleep apnea   . Thrombocytopenia [facility])     HOSPITAL COURSE:   85 year old male with past medical history of CLL, hypertension, hyperlipidemia, gout, history of thrombocytopenia who presents to the hospital due to shortness of breath and noted to have a left-sided pleural effusion.  1.  Shortness of breath-secondary to a moderate left-sided pleural effusion. Patient underwent diagnostic/therapeutic thoracentesis and had 900 cc of fluid removed.  His shortness of breath is improved and he is not hypoxic and therefore being discharged home. - Etiology of effusion unclear.  Plan for diagnostic/therapeutic thoracentesis today. -Continue supportive care for now.  2.  Pleural effusion-  patient underwent a diagnostic/therapeutic thoracentesis and had 900 cc of fluid removed.  Post thoracentesis chest x-ray showed complete resolution of the pleural effusion. -Pleural fluid analysis is consistent with a transudative effusion. -Does not appear to be infected, flow cytometry/cytology was  done on the fluid which can be followed up as an outpatient.    3. BPH - no urinary retention  - pt. Will cont. Flomax.   4. Hx of CLL - previously treated, last treatment with Gazyva in 2018. Currently on surveillance. - await flow cytometry on the pleural fluid.  -CT chest done on the day of discharge which showed no mass but a loculated left-sided pleural effusion which was drained as mentioned above.  Patient will continue follow-up with his oncologist [name] as an outpatient.  5. Elevated Trop - due to supply demand ischemia and did not trend up.     DISCHARGE CONDITIONS:   Stable.   CONSULTS OBTAINED:    DRUG ALLERGIES:   Allergies  Allergen Reactions  . Penicillins Other (See Comments)    Has patient had a PCN reaction causing immediate rash, facial/tongue/throat swelling, SOB or lightheadedness with hypotension: Unknown Has patient had a PCN reaction causing severe rash involving mucus membranes or skin necrosis: Unknown Has patient had a PCN reaction that required hospitalization: Unknown Has patient had a PCN reaction occurring within the last 10 years: Unknown If all of the above answers are "NO", then may proceed with Cephalosporin use.    DISCHARGE MEDICATIONS:   Allergies as of 04/03/2019      Reactions   Penicillins Other (See Comments)   Has patient had a PCN reaction causing immediate rash, facial/tongue/throat swelling, SOB or lightheadedness with hypotension: Unknown Has patient had a PCN reaction causing severe rash involving mucus membranes or skin necrosis: Unknown Has patient had a PCN reaction that required hospitalization: Unknown Has patient had  a PCN reaction occurring within the last 10 years: Unknown If all of the above answers are "NO", then may proceed with Cephalosporin use.      Medication List    STOP taking these medications   acyclovir 400 MG tablet Commonly known as:  ZOVIRAX     TAKE these medications   lisinopril 20 MG  tablet Commonly known as:  ZESTRIL Take 1 tablet (20 mg total) by mouth daily.   tamsulosin 0.4 MG Caps capsule Commonly known as:  FLOMAX Take 0.4 mg by mouth daily.         DISCHARGE INSTRUCTIONS:   DIET:  Regular diet  DISCHARGE CONDITION:  Stable  ACTIVITY:  Activity as tolerated  OXYGEN:  Home Oxygen: No.   Oxygen Delivery: room air  DISCHARGE LOCATION:  home   If you experience worsening of your admission symptoms, develop shortness of breath, life threatening emergency, suicidal or homicidal thoughts you must seek medical attention immediately by calling 911 or calling your MD immediately  if symptoms less severe.  You Must read complete instructions/literature along with all the possible adverse reactions/side effects for all the Medicines you take and that have been prescribed to you. Take any new Medicines after you have completely understood and accpet all the possible adverse reactions/side effects.   Please note  You were cared for by a hospitalist during your hospital stay. If you have any questions about your discharge medications or the care you received while you were in the hospital after you are discharged, you can call the unit and asked to speak with the hospitalist on call if the hospitalist that took care of you is not available. Once you are discharged, your primary care physician will handle any further medical issues. Please note that NO REFILLS for any discharge medications will be authorized once you are discharged, as it is imperative that you return to your primary care physician (or establish a relationship with a primary care physician if you do not have one) for your aftercare needs so that they can reassess your need for medications and monitor your lab values.    DATA REVIEW:   CBC Recent Labs  Lab 04/01/19 0600  WBC 6.3  HGB 15.1  HCT 45.4  PLT 153    Chemistries  Recent Labs  Lab 03/31/19 1628  04/01/19 0600  NA 137  --   140  K 4.3  --  4.1  CL 105  --  107  CO2 23  --  24  GLUCOSE 98  --  95  BUN 17  --  16  CREATININE 1.16   < > 1.09  CALCIUM 8.6*  --  8.6*  AST 19  --   --   ALT 14  --   --   ALKPHOS 46  --   --   BILITOT 1.4*  --   --    < > = values in this interval not displayed.    Cardiac Enzymes Recent Labs  Lab 04/01/19 0600  TROPONINI 0.04*    Microbiology Results  Results for orders placed or performed during the hospital encounter of 03/31/19  SARS Coronavirus 2 (CEPHEID - Performed in [place] hospital lab), Hosp Order     Status: None   Collection Time: 03/31/19  4:28 PM  Result Value Ref Range Status   SARS Coronavirus 2 NEGATIVE NEGATIVE Final    Comment: (NOTE) If result is NEGATIVE SARS-CoV-2 target nucleic acids are NOT DETECTED. The SARS-CoV-2  RNA is generally detectable in upper and lower  respiratory specimens during the acute phase of infection. The lowest  concentration of SARS-CoV-2 viral copies this assay can detect is 250  copies / mL. A negative result does not preclude SARS-CoV-2 infection  and should not be used as the sole basis for treatment or other  patient management decisions.  A negative result may occur with  improper specimen collection / handling, submission of specimen other  than nasopharyngeal swab, presence of viral mutation(s) within the  areas targeted by this assay, and inadequate number of viral copies  (<250 copies / mL). A negative result must be combined with clinical  observations, patient history, and epidemiological information. If result is POSITIVE SARS-CoV-2 target nucleic acids are DETECTED. The SARS-CoV-2 RNA is generally detectable in upper and lower  respiratory specimens dur ing the acute phase of infection.  Positive  results are indicative of active infection with SARS-CoV-2.  Clinical  correlation with patient history and other diagnostic information is  necessary to determine patient infection status.  Positive  results do  not rule out bacterial infection or co-infection with other viruses. If result is PRESUMPTIVE POSTIVE SARS-CoV-2 nucleic acids MAY BE PRESENT.   A presumptive positive result was obtained on the submitted specimen  and confirmed on repeat testing.  While 2019 novel coronavirus  (SARS-CoV-2) nucleic acids may be present in the submitted sample  additional confirmatory testing may be necessary for epidemiological  and / or clinical management purposes  to differentiate between  SARS-CoV-2 and other Sarbecovirus currently known to infect humans.  If clinically indicated additional testing with an alternate test  methodology [phone]) is advised. The SARS-CoV-2 RNA is generally  detectable in upper and lower respiratory sp ecimens during the acute  phase of infection. The expected result is Negative. Fact Sheet for Patients:  StrictlyIdeas.no Fact Sheet for Healthcare Providers: BankingDealers.co.za This test is not yet approved or cleared by the [place] FDA and has been authorized for detection and/or diagnosis of SARS-CoV-2 by FDA under an Emergency Use Authorization (EUA).  This EUA will remain in effect (meaning this test can be used) for the duration of the COVID-19 declaration under Section 564(b)(1) of the Act, 21 U.S.C. section 360bbb-3(b)(1), unless the authorization is terminated or revoked sooner. Performed at [facility], [address] [place], [place] [address]   Body fluid culture     Status: None (Preliminary result)   Collection Time: 04/03/19  2:50 PM  Result Value Ref Range Status   Specimen Description   Final    PLEURAL Performed at [facility], [address], [place], [place] [address]    Special Requests   Final    NONE Performed at [facility], [address] [place], [place] [address]    Gram Stain   Final    FEW WBC PRESENT, PREDOMINANTLY MONONUCLEAR NO  ORGANISMS SEEN    Culture   Final    NO GROWTH < 12 HOURS Performed at [facility], [place] [address], [place], [place] [address]    Report Status PENDING  Incomplete    RADIOLOGY:  Dg Chest 1 View  Result Date: 04/03/2019 CLINICAL DATA:  Status post left thoracentesis. EXAM: CHEST  1 VIEW COMPARISON:  Radiograph of same day. FINDINGS: Stable cardiomediastinal silhouette. Atherosclerosis of thoracic aorta is noted. Left-sided pacemaker is unchanged. Right lung is clear. Left pleural effusion noted on prior exam appears to be nearly resolved. Mild left basilar pneumothorax  is noted most likely due to incomplete re-expansion of the lung due to longstanding effusion and cortical thickening. Left basilar opacity remains consistent with scarring or atelectasis. Bony thorax is unremarkable. IMPRESSION: Left pleural effusion appears to be nearly resolved status post thoracentesis. Mild left basilar pneumothorax is noted most likely due to incomplete re-expansion of the lung due to longstanding effusion and cortical thickening. Aortic Atherosclerosis (ICD10-I70.0). Electronically Signed   By: Marijo Conception M.D.   On: 04/03/2019 15:22   Ct Chest W Contrast  Result Date: 04/03/2019 CLINICAL DATA:  Left-sided chest pain. Difficulty breathing for 3 months. EXAM: CT CHEST WITH CONTRAST TECHNIQUE: Multidetector CT imaging of the chest was performed during intravenous contrast administration. CONTRAST:  66mL OMNIPAQUE IOHEXOL 300 MG/ML  SOLN COMPARISON:  CT cap 08/18/2011 and chest radiograph 04/03/2019 FINDINGS: Cardiovascular: The heart size appears within normal limits. Trace pericardial effusion. Aortic atherosclerosis. Calcifications within the left main, lad, left circumflex and RCA coronary arteries noted. Mediastinum/Nodes: No enlarged mediastinal, hilar, or axillary lymph nodes. Thyroid gland, trachea, and esophagus demonstrate no significant findings. Lungs/Pleura: There is a moderate loculated left  pleural effusion. Overlying compressive type atelectasis is identified within the lingula and left lower lobe. No discrete mass identified. Small subpleural nodule overlying the posterolateral right upper lobe is unchanged measuring 4 mm, image 55/3. Upper Abdomen: No acute abnormalities. Right upper pole kidney cysts noted. Musculoskeletal: Spondylosis within the thoracic spine. No aggressive lytic or sclerotic bone lesions. IMPRESSION: 1. Moderate volume loculated left pleural effusion is identified with overlying compressive type atelectasis. Etiology is indeterminate. Consider further evaluation with the diagnostic thoracentesis. 2.  Aortic Atherosclerosis (ICD10-I70.0). 3. Multi vessel coronary artery calcifications including left main disease. Electronically Signed   By: Kerby Moors M.D.   On: 04/03/2019 13:24   Dg Chest Port 1 View  Result Date: 04/03/2019 CLINICAL DATA:  Pleural effusion and shortness of breath. EXAM: PORTABLE CHEST 1 VIEW COMPARISON:  03/31/2019 FINDINGS: Similar radiographic appearance. The right chest is clear. There is a moderate to large left effusion with associated left lower lung atelectasis and or infiltrate. Single lead pacemaker remains evident. Aortic atherosclerosis remains evident. IMPRESSION: Similar appearance. Persistent left effusion with left lower lung atelectasis and or infiltrate Electronically Signed   By: Nelson Chimes M.D.   On: 04/03/2019 11:09   US Thoracentesis Asp Pleural Space W/img Guide  Result Date: 04/03/2019 CLINICAL DATA:  Left pleural effusion. EXAM: ULTRASOUND GUIDED LEFT THORACENTESIS COMPARISON:  None. PROCEDURE: An ultrasound guided thoracentesis was thoroughly discussed with the patient and questions answered. The benefits, risks, alternatives and complications were also discussed. The patient understands and wishes to proceed with the procedure. Written consent was obtained. Ultrasound was performed to localize and mark an adequate pocket of  fluid in the left chest. The area was then prepped and draped in the normal sterile fashion. 1% Lidocaine was used for local anesthesia. Under ultrasound guidance a 6 French Safe-T-Centesis catheter was introduced. Thoracentesis was performed. The catheter was removed and a dressing applied. COMPLICATIONS: None FINDINGS: A total of approximately 900 mL of yellowish fluid was removed. A fluid sample was sent for laboratory analysis. IMPRESSION: Successful ultrasound guided left thoracentesis yielding 900 mL of pleural fluid. Electronically Signed   By: Aletta Edouard M.D.   On: 04/03/2019 15:48      Management plans discussed with the patient, family and they are in agreement.  CODE STATUS:  Code Status History    Date Active Date Inactive Code Status Order  ID Comments User Context   04/01/2019 0018 04/03/2019 2059 Full Code [id number]  Lance Coon, MD Inpatient    TOTAL TIME TAKING CARE OF THIS PATIENT: 40 minutes.    Henreitta Leber M.D on 04/04/2019 at 3:36 PM  Between 7am to 6pm - Pager - [phone]  After 6pm go to [email or web address] - Proofreader  [facility]  Office  [phone]  CC: Primary care physician; System, Provider Not In

## 2019-04-05 LAB — PH, BODY FLUID: pH, Body Fluid: 7.5

## 2019-04-05 LAB — PROTEIN, BODY FLUID (OTHER): Total Protein, Body Fluid Other: 2.7 g/dL

## 2019-04-06 ENCOUNTER — Encounter: Payer: Self-pay | Admitting: Family Medicine

## 2019-04-06 ENCOUNTER — Ambulatory Visit (INDEPENDENT_AMBULATORY_CARE_PROVIDER_SITE_OTHER): Payer: Medicare Other | Admitting: Family Medicine

## 2019-04-06 ENCOUNTER — Other Ambulatory Visit: Payer: Self-pay

## 2019-04-06 VITALS — BP 140/80 | HR 66 | Ht 67.0 in | Wt 230.0 lb

## 2019-04-06 DIAGNOSIS — D696 Thrombocytopenia, unspecified: Secondary | ICD-10-CM | POA: Diagnosis not present

## 2019-04-06 DIAGNOSIS — J948 Other specified pleural conditions: Secondary | ICD-10-CM | POA: Diagnosis not present

## 2019-04-06 DIAGNOSIS — Z7689 Persons encountering health services in other specified circumstances: Secondary | ICD-10-CM

## 2019-04-06 DIAGNOSIS — I2510 Atherosclerotic heart disease of native coronary artery without angina pectoris: Secondary | ICD-10-CM

## 2019-04-06 DIAGNOSIS — I700 Atherosclerosis of aorta: Secondary | ICD-10-CM | POA: Diagnosis not present

## 2019-04-06 DIAGNOSIS — I10 Essential (primary) hypertension: Secondary | ICD-10-CM | POA: Diagnosis not present

## 2019-04-06 DIAGNOSIS — I482 Chronic atrial fibrillation, unspecified: Secondary | ICD-10-CM | POA: Insufficient documentation

## 2019-04-06 DIAGNOSIS — Z794 Long term (current) use of insulin: Secondary | ICD-10-CM | POA: Insufficient documentation

## 2019-04-06 DIAGNOSIS — I2584 Coronary atherosclerosis due to calcified coronary lesion: Secondary | ICD-10-CM | POA: Diagnosis not present

## 2019-04-06 DIAGNOSIS — Z23 Encounter for immunization: Secondary | ICD-10-CM

## 2019-04-06 LAB — CYTOLOGY - NON PAP

## 2019-04-06 MED ORDER — LISINOPRIL 20 MG PO TABS: 20.0000 mg | ORAL_TABLET | Freq: Every day | ORAL | 1 refills | Status: DC

## 2019-04-06 NOTE — Progress Notes (Signed)
Date:  04/06/2019   Name:  Brent Hoover   DOB:  1930-06-08   MRN:  [id number]   Chief Complaint: Establish Care (has not had pcp ) and Hypertension  Patient is a 85 year old male who presents for a comprehensive physical exam. The patient reports the following problems: follow up for hosp discharge for left pleural effusion. Health maintenance has been reviewed pneum 13.  Hypertension  This is a chronic problem. The current episode started more than 1 year ago. The problem has been waxing and waning since onset. The problem is controlled. Pertinent negatives include no anxiety, blurred vision, chest pain, headaches, malaise/fatigue, neck pain, orthopnea, palpitations, peripheral edema, PND, shortness of breath or sweats. There are no associated agents to hypertension. There are no known risk factors for coronary artery disease. Past treatments include ACE inhibitors. The current treatment provides moderate improvement. There are no compliance problems.  There is no history of angina, kidney disease, CAD/MI, CVA, heart failure, left ventricular hypertrophy, PVD or retinopathy. There is no history of chronic renal disease, a hypertension causing med or renovascular disease.    Review of Systems  Constitutional: Negative for chills, fever and malaise/fatigue.  HENT: Negative for drooling, ear discharge, ear pain, nosebleeds, postnasal drip, rhinorrhea, sinus pressure, sinus pain, sneezing and sore throat.   Eyes: Negative for blurred vision.  Respiratory: Negative for cough, shortness of breath and wheezing.   Cardiovascular: Negative for chest pain, palpitations, orthopnea, leg swelling and PND.  Gastrointestinal: Negative for abdominal pain, blood in stool, constipation, diarrhea and nausea.  Endocrine: Negative for polydipsia.  Genitourinary: Negative for dysuria, frequency, hematuria and urgency.  Musculoskeletal: Negative for back pain, myalgias and neck pain.  Skin: Negative for rash.   Allergic/Immunologic: Negative for environmental allergies.  Neurological: Negative for dizziness and headaches.  Hematological: Does not bruise/bleed easily.  Psychiatric/Behavioral: Negative for suicidal ideas. The patient is not nervous/anxious.     Patient Active Problem List   Diagnosis Date Noted  . Shortness of breath   . HLD (hyperlipidemia) 03/31/2019  . HTN (hypertension) 03/31/2019  . Pleural effusion, left 03/31/2019  . CKD (chronic kidney disease), stage III ([place]) 03/31/2019  . Elevated troponin 03/31/2019  . Hyponatremia 05/20/2018  . Splenomegaly 07/27/2017  . CLL (chronic lymphocytic leukemia) ([place]) 04/30/2015    Allergies  Allergen Reactions  . Penicillins Other (See Comments)    Has patient had a PCN reaction causing immediate rash, facial/tongue/throat swelling, SOB or lightheadedness with hypotension: Unknown Has patient had a PCN reaction causing severe rash involving mucus membranes or skin necrosis: Unknown Has patient had a PCN reaction that required hospitalization: Unknown Has patient had a PCN reaction occurring within the last 10 years: Unknown If all of the above answers are "NO", then may proceed with Cephalosporin use.    Past Surgical History:  Procedure Laterality Date  . COLONOSCOPY WITH PROPOFOL N/A 09/10/2015   Procedure: COLONOSCOPY WITH PROPOFOL;  Surgeon: Lucilla Lame, MD;  Location: [facility] ENDOSCOPY;  Service: Endoscopy;  Laterality: N/A;  . PROSTATE SURGERY  2008    Social History   Tobacco Use  . Smoking status: Never Smoker  . Smokeless tobacco: Never Used  Substance Use Topics  . Alcohol use: No    Alcohol/week: 0.0 standard drinks  . Drug use: No     Medication list has been reviewed and updated.  Current Meds  Medication Sig  . lisinopril (PRINIVIL,ZESTRIL) 20 MG tablet Take 1 tablet (20 mg total) by mouth  daily.  . tamsulosin (FLOMAX) 0.4 MG CAPS capsule Take 0.4 mg by mouth daily. Dr Rogers Blocker    [facility] 2/9 Scores 04/06/2019  05/24/2018  PHQ - 2 Score 0 0  PHQ- 9 Score 0 -    BP Readings from Last 3 Encounters:  04/06/19 140/80  04/03/19 (!) 156/86  03/31/19 (!) 143/78    Physical Exam Vitals signs and nursing note reviewed.  HENT:     Head: Normocephalic.     Right Ear: Tympanic membrane, ear canal and external ear normal.     Left Ear: Tympanic membrane, ear canal and external ear normal.     Nose: Nose normal.     Mouth/Throat:     Mouth: Mucous membranes are moist.     Pharynx: No oropharyngeal exudate.  Eyes:     General: No scleral icterus.       Right eye: No discharge.        Left eye: No discharge.     Conjunctiva/sclera: Conjunctivae normal.     Pupils: Pupils are equal, round, and reactive to light.  Neck:     Musculoskeletal: Normal range of motion and neck supple.     Thyroid: No thyromegaly.     Vascular: No JVD.     Trachea: No tracheal deviation.  Cardiovascular:     Rate and Rhythm: Normal rate and regular rhythm.     Heart sounds: Normal heart sounds. No murmur. No friction rub. No gallop.   Pulmonary:     Effort: No respiratory distress.     Breath sounds: Normal breath sounds. No wheezing or rales.  Abdominal:     General: Bowel sounds are normal.     Palpations: Abdomen is soft. There is no mass.     Tenderness: There is no abdominal tenderness. There is no guarding or rebound.  Musculoskeletal: Normal range of motion.        General: No tenderness.  Lymphadenopathy:     Cervical: No cervical adenopathy.  Skin:    General: Skin is warm.     Findings: No rash.  Neurological:     Mental Status: He is alert and oriented to person, place, and time.     Cranial Nerves: No cranial nerve deficit.     Deep Tendon Reflexes: Reflexes are normal and symmetric.     Wt Readings from Last 3 Encounters:  04/06/19 230 lb (104.3 kg)  04/01/19 225 lb 4.8 oz (102.2 kg)  03/31/19 230 lb (104.3 kg)    BP 140/80   Pulse 66   Ht 5\' 7"  (1.702 m)   Wt 230 lb (104.3 kg)   BMI  36.02 kg/m   Assessment and Plan:  1. Establishing care with new doctor, encounter for Patient to establish care with new physician patient was seen several years ago for hypertension and this will be continued by me.  2. Pleural effusion with transudate Viewed recent discharge for pleural effusion that was transudate of and evaluation physical exam noted breath sounds on both sides patient will return in 10 days for reevaluation and likely chest x-ray to determine if any reaccumulation.  3. Atherosclerosis of aorta [facility]) Patient with history of atherosclerosis is noted.  4. Chronic atrial fibrillation Patient followed by cardiology  5. Thrombocytopenia, unspecified ([place]) Patient has a history of thrombocytopenia along with CLL followed by oncology/hematology-Dr. Yevette Edwards.    7. Essential hypertension .  Controlled.  Continued with sodium reduction. - lisinopril (ZESTRIL) 20 MG tablet; Take 1 tablet (20 mg  total) by mouth daily.  Dispense: 90 tablet; Refill: 1  8. Need for pneumococcal vaccination Assessed and administered - Pneumococcal conjugate vaccine 13-valent  9. Coronary atherosclerosis due to calcified coronary lesion As noted on his CT scan that he had some calcification in his coronary artery disease heart healthy diet provided and patient denies any symptoms of angina.

## 2019-04-07 LAB — BODY FLUID CULTURE: Culture: NO GROWTH

## 2019-04-08 NOTE — Progress Notes (Deleted)
[facility]  [address] [place], [place] [address] Phone: [phone]  Fax: [phone]   Telemedicine Office Visit:  04/08/2019  Referring physician: No ref. provider found  I connected with Brent Hoover on 04/08/2019 at 1:35 PM by videoconferencing and verified that I was speaking with the correct person using 2 identifiers.  The patient was at *** home.  I discussed the limitations, risk, security and privacy concerns of performing an evaluation and management service by videoconferencing and the availability of in person appointments.  I also discussed with the patient that there may be a patient responsible charge related to this service.  The patient expressed understanding and agreed to proceed.   Chief Complaint: Brent Hoover is a 85 y.o. male with CLL who is seen for new assessment and follow-up after recent hospitalization.   HPI: He was initially diagnosed with CLL in ***. He presented with *** He has a history of splenomegaly and chronic kidney disease.   He was initially seen in the hematology clinic by *** He transitioned to the care of Dr. Charlaine Dalton on 08/06/2015. He was asymptomatic and treatment was not started at that time. He has been followed every 6 months.   Workup on 11/05/2015: flow cytometry revealed CLL, B-cell, CD38 negative. Hepatitis B surface antibody was negative. 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 IgG 525, IgA 104, IgM 16.   WBC was followed and ranged between 69,000 and 134,000 between 06/05/2014 - 07/27/2017.  He presented with B symptoms (30-40lbs in 6 months, night sweats), early satiety, and LLQ abdominal discomfort on 07/27/2017.  Abdomen and pelvis CT on 07/29/2017 revealed splenomegaly (volume 1000 cc). There were an abnormal number of scattered lymph nodes in the abdomen and pelvis. There was stranding along some omental adipose tissue in the left lower quadrant,  possibly due to a remote omental infarct or less likely appendagitis epiploica.  He began [place] (obinutuzumab) on 08/04/2017. He received Gazyva monthly x 6 from 08/04/2017 - 01/05/2018.  He has subsequently been under surveillance every 64-months.   WBC has been between 5500 - 7800 from 09/01/2017 - 03/31/2019.  The patient was last seen in the oncology clinic on 10/04/2018 by Dr. Rogue Bussing.  At that time, he was doing well and asymptomatic. He denied any melena or heamtochezia.  Appetite was good.   On 03/31/2019, he presented to the [facility] for shortness of breath for 3 weeks. CXR showed new moderate left pleural effusion with adjacent lingular and left lower lobe atelectasis. The right lung was clear.  There was no pneumothorax or acute osseous abnormality.  There was an unnchanged left chest wall pacemaker. There was stable cardiomegaly.  There was atherosclerotic calcification of the aortic arch and normal pulmonary vascularity.   He was admitted to [facility] from 03/31/2019 - 04/03/2019 with shortness of breath and a left sided pleural effusion.  Diagnostic and therapeutic left thoracentesis on 04/03/2019 revealed 900 cc of yellow fluid was removed.  His shortness of breath improved.  The etiology of his effusion unclear.   Pleural fluid was consistent with a transudative effusion.  Cytology was negative.  There were reactive mesothelial cells and predominant lymphocytes.  The majority of the lymphocytes stained with CD5 (T-cell marker). There was no co-expression of PAX 5 (B-cell marker), CD5, and CD23.  Gram stain and cultures were negative.  During the interim, he ***   Past Medical History:  Diagnosis Date  Bladder neck obstruction    Cataracts, bilateral    Chronic lymphocytic leukemia (CLL), B-cell (HCC)    Lambda positive B-cell CLL   Gout    Hypercholesterolemia    Hypertension    Lymphocytosis    Sleep apnea    Thrombocytopenia (HCC)     Past Surgical  History:  Procedure Laterality Date   COLONOSCOPY WITH PROPOFOL N/A 09/10/2015   Procedure: COLONOSCOPY WITH PROPOFOL;  Surgeon: Lucilla Lame, MD;  Location: [facility] ENDOSCOPY;  Service: Endoscopy;  Laterality: N/A;   PROSTATE SURGERY  2008    Family History  Problem Relation Age of Onset   Lung cancer Mother 15       deceased   Brain cancer Son 16       pt's only son   Polycythemia Brother        half brother   Prostate cancer Brother     Social History:  reports that he has never smoked. He has never used smokeless tobacco. He reports that he does not drink alcohol or use drugs.The patient is ***alone/accompanied by *** today.  Participants in the patient's visit and their role in the encounter included the patient, ***, and Waymon Budge, RN or Vito Berger, CMA, today.  The intake visit was provided by *** Waymon Budge, RN or Vito Berger, [place].  Allergies:  Allergies  Allergen Reactions   Penicillins Other (See Comments)    Has patient had a PCN reaction causing immediate rash, facial/tongue/throat swelling, SOB or lightheadedness with hypotension: Unknown Has patient had a PCN reaction causing severe rash involving mucus membranes or skin necrosis: Unknown Has patient had a PCN reaction that required hospitalization: Unknown Has patient had a PCN reaction occurring within the last 10 years: Unknown If all of the above answers are "NO", then may proceed with Cephalosporin use.    Current Medications: Current Outpatient Medications  Medication Sig Dispense Refill   lisinopril (ZESTRIL) 20 MG tablet Take 1 tablet (20 mg total) by mouth daily. 90 tablet 1   tamsulosin (FLOMAX) 0.4 MG CAPS capsule Take 0.4 mg by mouth daily. Dr Rogers Blocker     No current facility-administered medications for this visit.     Review of Systems  Constitutional: Negative for chills, diaphoresis, fever, malaise/fatigue and weight loss.  HENT: Negative for congestion, hearing  loss, sinus pain and sore throat.   Eyes: Negative for blurred vision.  Respiratory: Negative for cough, sputum production and shortness of breath.   Cardiovascular: Negative for chest pain, palpitations, claudication, leg swelling and PND.  Gastrointestinal: Negative for abdominal pain, blood in stool, constipation, diarrhea, heartburn, melena, nausea and vomiting.  Genitourinary: Negative for dysuria, frequency and urgency.  Musculoskeletal: Negative for back pain, joint pain and myalgias.  Skin: Negative for itching and rash.  Neurological: Negative for dizziness, tingling, sensory change, seizures, weakness and headaches.  Endo/Heme/Allergies: Does not bruise/bleed easily.  Psychiatric/Behavioral: Negative for depression and memory loss. The patient is not nervous/anxious and does not have insomnia.   All other systems reviewed and are negative.   Performance status (ECOG): 0  Physical Exam  Constitutional: He is oriented to person, place, and time. He appears well-developed and well-nourished. No distress.  Eyes: Pupils are equal, round, and reactive to light. Conjunctivae and EOM are normal. No scleral icterus.  Neurological: He is alert and oriented to person, place, and time.  Skin: Skin is warm and dry. No rash noted. He is not diaphoretic. No erythema.  Psychiatric: He has a normal mood  and affect. His behavior is normal. Judgment and thought content normal.  Nursing note reviewed.   No visits with results within 3 Day(s) from this visit.  Latest known visit with results is:  Admission on 03/31/2019, Discharged on 04/03/2019  Component Date Value Ref Range Status   Troponin I 03/31/2019 0.03* <0.03 ng/mL Final   Comment: CRITICAL RESULT CALLED TO, READ BACK BY AND VERIFIED WITH JENNIFER WHITLEY AT 1706 03/31/2019.PMF Performed at [facility], [address] [place], [place] [address]    WBC 03/31/2019 7.9  4.0 - 10.5 K/uL Final   RBC 03/31/2019 5.47  4.22 -  5.81 MIL/uL Final   Hemoglobin 03/31/2019 15.7  13.0 - 17.0 g/dL Final   HCT 03/31/2019 47.8  39.0 - 52.0 % Final   MCV 03/31/2019 87.4  80.0 - 100.0 fL Final   MCH 03/31/2019 28.7  26.0 - 34.0 pg Final   MCHC 03/31/2019 32.8  30.0 - 36.0 g/dL Final   RDW 03/31/2019 13.2  11.5 - 15.5 % Final   Platelets 03/31/2019 169  150 - 400 K/uL Final   nRBC 03/31/2019 0.0  0.0 - 0.2 % Final   Neutrophils Relative % 03/31/2019 67  % Final   Neutro Abs 03/31/2019 5.3  1.7 - 7.7 K/uL Final   Lymphocytes Relative 03/31/2019 22  % Final   Lymphs Abs 03/31/2019 1.7  0.7 - 4.0 K/uL Final   Monocytes Relative 03/31/2019 9  % Final   Monocytes Absolute 03/31/2019 0.7  0.1 - 1.0 K/uL Final   Eosinophils Relative 03/31/2019 1  % Final   Eosinophils Absolute 03/31/2019 0.1  0.0 - 0.5 K/uL Final   Basophils Relative 03/31/2019 1  % Final   Basophils Absolute 03/31/2019 0.1  0.0 - 0.1 K/uL Final   Immature Granulocytes 03/31/2019 0  % Final   Abs Immature Granulocytes 03/31/2019 0.03  0.00 - 0.07 K/uL Final   Performed at [facility], [address] [place], [place] [address]   Sodium 03/31/2019 137  135 - 145 mmol/L Final   Potassium 03/31/2019 4.3  3.5 - 5.1 mmol/L Final   Chloride 03/31/2019 105  98 - 111 mmol/L Final   CO2 03/31/2019 23  22 - 32 mmol/L Final   Glucose, Bld 03/31/2019 98  70 - 99 mg/dL Final   BUN 03/31/2019 17  8 - 23 mg/dL Final   Creatinine, Ser 03/31/2019 1.16  0.61 - 1.24 mg/dL Final   Calcium 03/31/2019 8.6* 8.9 - 10.3 mg/dL Final   Total Protein 03/31/2019 6.2* 6.5 - 8.1 g/dL Final   Albumin 03/31/2019 4.2  3.5 - 5.0 g/dL Final   AST 03/31/2019 19  15 - 41 U/L Final   ALT 03/31/2019 14  0 - 44 U/L Final   Alkaline Phosphatase 03/31/2019 46  38 - 126 U/L Final   Total Bilirubin 03/31/2019 1.4* 0.3 - 1.2 mg/dL Final   GFR calc non Af Amer 03/31/2019 57* >60 mL/min Final   GFR calc Af Amer 03/31/2019 >60  >60 mL/min Final   Anion  gap 03/31/2019 9  5 - 15 Final   Performed at [facility], [address] [place], [place] [address]   SARS Coronavirus 2 03/31/2019 NEGATIVE  NEGATIVE Final   Comment: (NOTE) If result is NEGATIVE SARS-CoV-2 target nucleic acids are NOT DETECTED. The SARS-CoV-2 RNA is generally detectable in upper and lower  respiratory specimens during the acute phase of infection. The lowest  concentration of SARS-CoV-2 viral copies this  assay can detect is 250  copies / mL. A negative result does not preclude SARS-CoV-2 infection  and should not be used as the sole basis for treatment or other  patient management decisions.  A negative result may occur with  improper specimen collection / handling, submission of specimen other  than nasopharyngeal swab, presence of viral mutation(s) within the  areas targeted by this assay, and inadequate number of viral copies  (<250 copies / mL). A negative result must be combined with clinical  observations, patient history, and epidemiological information. If result is POSITIVE SARS-CoV-2 target nucleic acids are DETECTED. The SARS-CoV-2 RNA is generally detectable in upper and lower  respiratory specimens dur                          ing the acute phase of infection.  Positive  results are indicative of active infection with SARS-CoV-2.  Clinical  correlation with patient history and other diagnostic information is  necessary to determine patient infection status.  Positive results do  not rule out bacterial infection or co-infection with other viruses. If result is PRESUMPTIVE POSTIVE SARS-CoV-2 nucleic acids MAY BE PRESENT.   A presumptive positive result was obtained on the submitted specimen  and confirmed on repeat testing.  While 2019 novel coronavirus  (SARS-CoV-2) nucleic acids may be present in the submitted sample  additional confirmatory testing may be necessary for epidemiological  and / or clinical management purposes  to  differentiate between  SARS-CoV-2 and other Sarbecovirus currently known to infect humans.  If clinically indicated additional testing with an alternate test  methodology [phone]) is advised. The SARS-CoV-2 RNA is generally  detectable in upper and lower respiratory sp                          ecimens during the acute  phase of infection. The expected result is Negative. Fact Sheet for Patients:  StrictlyIdeas.no Fact Sheet for Healthcare Providers: BankingDealers.co.za This test is not yet approved or cleared by the [place] FDA and has been authorized for detection and/or diagnosis of SARS-CoV-2 by FDA under an Emergency Use Authorization (EUA).  This EUA will remain in effect (meaning this test can be used) for the duration of the COVID-19 declaration under Section 564(b)(1) of the Act, 21 U.S.C. section 360bbb-3(b)(1), unless the authorization is terminated or revoked sooner. Performed at [facility], [place], [place] [address]    Troponin I 04/01/2019 0.03* <0.03 ng/mL Final   Comment: CRITICAL VALUE NOTED. VALUE IS CONSISTENT WITH PREVIOUSLY REPORTED/CALLED VALUE [facility] Performed at [facility], [place], [place] [address]    Troponin I 04/01/2019 0.04* <0.03 ng/mL Final   Comment: CRITICAL VALUE NOTED. VALUE IS CONSISTENT WITH PREVIOUSLY REPORTED/CALLED VALUE [facility] Performed at [facility], [address] [place], [place] [address]    WBC 04/01/2019 6.8  4.0 - 10.5 K/uL Final   RBC 04/01/2019 5.28  4.22 - 5.81 MIL/uL Final   Hemoglobin 04/01/2019 15.3  13.0 - 17.0 g/dL Final   HCT 04/01/2019 46.7  39.0 - 52.0 % Final   MCV 04/01/2019 88.4  80.0 - 100.0 fL Final   MCH 04/01/2019 29.0  26.0 - 34.0 pg Final   MCHC 04/01/2019 32.8  30.0 - 36.0 g/dL Final   RDW 04/01/2019 13.2  11.5 - 15.5 % Final   Platelets 04/01/2019 160  150 - 400 K/uL  Final   nRBC  04/01/2019 0.0  0.0 - 0.2 % Final   Performed at [facility], [place], [place] [address]   Creatinine, Ser 04/01/2019 1.15  0.61 - 1.24 mg/dL Final   GFR calc non Af Amer 04/01/2019 57* >60 mL/min Final   GFR calc Af Amer 04/01/2019 >60  >60 mL/min Final   Performed at [facility], [address] [place], [place] [address]   Sodium 04/01/2019 140  135 - 145 mmol/L Final   Potassium 04/01/2019 4.1  3.5 - 5.1 mmol/L Final   Chloride 04/01/2019 107  98 - 111 mmol/L Final   CO2 04/01/2019 24  22 - 32 mmol/L Final   Glucose, Bld 04/01/2019 95  70 - 99 mg/dL Final   BUN 04/01/2019 16  8 - 23 mg/dL Final   Creatinine, Ser 04/01/2019 1.09  0.61 - 1.24 mg/dL Final   Calcium 04/01/2019 8.6* 8.9 - 10.3 mg/dL Final   GFR calc non Af Amer 04/01/2019 >60  >60 mL/min Final   GFR calc Af Amer 04/01/2019 >60  >60 mL/min Final   Anion gap 04/01/2019 9  5 - 15 Final   Performed at [facility], [address] [place], [place] [address]   WBC 04/01/2019 6.3  4.0 - 10.5 K/uL Final   RBC 04/01/2019 5.25  4.22 - 5.81 MIL/uL Final   Hemoglobin 04/01/2019 15.1  13.0 - 17.0 g/dL Final   HCT 04/01/2019 45.4  39.0 - 52.0 % Final   MCV 04/01/2019 86.5  80.0 - 100.0 fL Final   MCH 04/01/2019 28.8  26.0 - 34.0 pg Final   MCHC 04/01/2019 33.3  30.0 - 36.0 g/dL Final   RDW 04/01/2019 13.2  11.5 - 15.5 % Final   Platelets 04/01/2019 153  150 - 400 K/uL Final   nRBC 04/01/2019 0.0  0.0 - 0.2 % Final   Performed at [facility], [address] [place], [place] [address]   Weight 04/01/2019 3,604.8  oz Final   Height 04/01/2019 67  in Final   BP 04/01/2019 110/50  mmHg Final   Prostatic Specific Antigen 04/02/2019 4.33* 0.00 - 4.00 ng/mL Final   Comment: (NOTE) While PSA levels of <=4.0 ng/ml are reported as reference range, some men with levels below 4.0 ng/ml can have prostate cancer and many men with PSA above 4.0 ng/ml do not  have prostate cancer.  Other tests such as free PSA, age specific reference ranges, PSA velocity and PSA doubling time may be helpful especially in men less than 91 years old. Performed at [facility], [place] [address] [place], [place] [address]    CYTOLOGY - NON GYN 04/03/2019    Final                   Value:Cytology - Non PAP CASE: [id number] PATIENT: Brent Hoover Non-Gyn Cytology Report     SPECIMEN SUBMITTED: A. Pleural fluid, left  CLINICAL HISTORY: 85 year old male with past medical history of CLL and thrombocytopenia who presents to the hospital due to shortness of breath; found to have a left-sided pleural effusion.  PRE-OPERATIVE DIAGNOSIS: Pleural effusion  POST-OPERATIVE DIAGNOSIS: Same as above     DIAGNOSIS: A. PLEURAL EFFUSION, LEFT; ULTRASOUND-GUIDED THORACENTESIS: - NEGATIVE FOR MALIGNANCY. - REACTIVE MESOTHELIAL CELLS AND PREDOMINANCE OF LYMPHOCYTES, SEE COMMENT.  Comment: Given the patient's history of CLL and the predominance of lymphocytes in the specimen a limited panel of immunohistochemical stains was performed on  the cell block.  The majority of the lymphocytes stain with CD5 (T-cell marker).  There is no co-expression of PAX 5 (B-cell marker), CD5, and CD23.  IHC slides were prepared by [facility] a                         nd Pathology, [place], [place]. All controls stained appropriately.  This test was developed and its performance characteristics determined by [facility]. It has not been cleared or approved by the [place] Food and Drug Administration. The FDA does not require this test to go through premarket FDA review. This test is used for clinical purposes. It should not be regarded as investigational or for research. This laboratory is certified under the Clinical Laboratory Improvement Amendments (CLIA) as qualified to perform high complexity clinical laboratory testing.    GROSS DESCRIPTION: A.  Labeled: Left pleural Received: Fresh Volume: 900 mL Description: Yellow-orange, cloudy fluid in a 1000 mL glass evacuated container Submitted for ThinPrep and cell block.   Final Diagnosis performed by Quay Burow, MD.   Electronically signed 04/06/2019 2:50:49PM The electronic signature indicates that the named Attending Pathologist has evaluated the specimen  Technical component performed at [facility], [address], [place], [place] [address] Lab: [phone] Dir: Rush Farmer, MD, MMM  Professional component performed at [facility], [facility], [place], [place], [place] [address] Lab: [phone] Dir: Dellia Nims. Rubinas, MD    Fluid Type-FCT 04/03/2019 PLEURAL   Corrected   CORRECTED ON 06/01 AT 1527: PREVIOUSLY REPORTED AS CYTOPLEU   Color, Fluid 04/03/2019 YELLOW* YELLOW Final   Appearance, Fluid 04/03/2019 HAZY* CLEAR Final   WBC, Fluid 04/03/2019 888  cu mm Final   Neutrophil Count, Fluid 04/03/2019 2  % Final   Lymphs, Fluid 04/03/2019 90  % Final   Monocyte-Macrophage-Serous Fluid 04/03/2019 6  % Final   Eos, Fluid 04/03/2019 2  % Final   Other Cells, Fluid 04/03/2019 0  % Final   Performed at [facility], [address], [place], [place] [address]   Specimen Description 04/03/2019    Final                   Value:PLEURAL Performed at [facility], [address], [place], [place] [address]    Special Requests 04/03/2019    Final                   Value:NONE Performed at [facility], [address] [place], [place] [address]    Gram Stain 04/03/2019    Final                   Value:FEW WBC PRESENT, PREDOMINANTLY MONONUCLEAR NO ORGANISMS SEEN    Culture 04/03/2019    Final                   Value:NO GROWTH 3 DAYS Performed at [facility], [place] [address], [place], [place] [address]    Report Status 04/03/2019 04/07/2019 FINAL   Final   Total protein, fluid  04/03/2019 <3.0  g/dL Final   Fluid Type-FTP 04/03/2019 PLEURAL   Corrected   Comment: Performed at [facility], [address] [place], [place] [address] CORRECTED ON 06/01 AT 1527: PREVIOUSLY REPORTED AS CYTOPLEU  Glucose, Fluid 04/03/2019 87  mg/dL Final   Comment: (NOTE) No normal range established for this test Results should be evaluated in conjunction with serum values    Fluid Type-FGLU 04/03/2019 PLEURAL   Corrected   Comment: Performed at [facility], [address] [place], [place] [address] CORRECTED ON 06/01 AT 1527: PREVIOUSLY REPORTED AS CYTOPLEU    pH, Body Fluid 04/03/2019 7.5  Not Estab. Final   Comment: (NOTE) This test was developed and its performance characteristics determined by [facility]. It has not been cleared or approved by the Food and Drug Administration. The reference interval(s) and other method performance specifications have not been established for this body fluid. The test result must be integrated into the clinical context for interpretation. Performed At: [facility], [place] [id number] Rush Farmer MD ER:[id number]    Source of Sample 04/03/2019 PLEURAL   Final   Performed at [facility], [address] [place],  [address]    Assessment:  Brent Hoover is a 85 y.o. male with chronic lymphocytic leukemia. Flow cytometry revealed CLL, B-cell, CD38 negative. 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  WBC ranged between 69,000 and 134,000 between 06/05/2014 - 07/27/2017.  Abdomen and pelvis CT on 07/29/2017 revealed splenomegaly (volume 1000 cc). There were an abnormal number of scattered lymph nodes in the abdomen and pelvis.   He received Gazyva monthly x 6 from 08/04/2017 - 01/05/2018.   Abdomen and pelvis CT on 03/04/2018 revealed improved splenomegaly (480 cc) and resolved adenopathy.  He has a developmental venous variant (double  IVC).  WBC has ranged between 5500 - 7800 from 09/01/2017 - 03/31/2019.  He was admitted to [facility] from 03/31/2019 - 04/03/2019 with shortness of breath and a left sided pleural effusion.  Diagnostic and therapeutic left thoracentesis on 04/03/2019 revealed 900 cc of fluid c/w a transudative effusion.  Cytology was negative.  There were reactive mesothelial cells and predominant lymphocytes.  The majority of the lymphocytes stained with CD5 (T-cell marker). There was no co-expression of PAX 5 (B-cell marker), CD5, and CD23.  Gram stain and cultures were negative.  Symptomatically, ***  Plan: 1.   Chronic lymphocytic leukemia   I discussed the assessment and treatment plan with the patient.  The patient was provided an opportunity to ask questions and all were answered.  The patient agreed with the plan and demonstrated an understanding of the instructions.  The patient was advised to call back or seek an in person evaluation if the symptoms worsen or if the condition fails to improve as anticipated.  I provided *** minutes (1:35 PM - X:XX) of face-to-face video visit time during this this encounter and > 50% was spent counseling as documented under my assessment and plan.  I provided these services from the [facility] office.   Nolon Stalls, MD, PhD  04/08/2019, 1:35 PM  I, Molly Dorshimer, am acting as Education administrator for [employer]. Mike Gip, MD, PhD.  {Add scribe attestation statement}

## 2019-04-09 NOTE — Progress Notes (Signed)
[facility]  [address] [place], [place] [address] Phone: [phone]  Fax: [phone]   Clinic Day:  04/10/2019  Referring physician: No ref. provider found  Chief Complaint: Brent Hoover is a 85 y.o. male with CLL who is seen for new patient assessment and follow-up after recent hospitalization.   HPI:  The patient was initially diagnosed with CLL in the 1960s while involved with a program at [facility] where his blood was checked 3 times yearly. He was asymptomatic. He was initially seen at the [facility], and has had 5 different physicians over the years. He has a history of splenomegaly and chronic kidney disease.   He transitioned to the care of Dr. Charlaine Dalton on 08/06/2015. He was asymptomatic and treatment was not started at that time. He was followed every 6 months.   Workup on 11/05/2015: flow cytometry revealed CLL, B-cell, CD38 negative. Hepatitis B surface antibody was negative.  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 IgG was 525, IgA 104, IgM 16.   WBC has been followed and ranged between 69,000 - 134,000 between 06/05/2014 - 07/27/2017.  He presented with B symptoms (30-40 lbs in 6 months, night sweats), early satiety, and LLQ abdominal discomfort on 07/27/2017.  Abdomen and pelvis CT on 07/29/2017 revealed splenomegaly (volume 1000 cc). There were an abnormal number of scattered lymph nodes in the abdomen and pelvis. There was stranding along some omental adipose tissue in the left lower quadrant, possibly due to a remote omental infarct or less likely appendagitis epiploica.  He began [place] (obinutuzumab) on 08/04/2017. He received Gazyva monthly x 6 from 08/04/2017 - 01/05/2018.  He has subsequently been under surveillance every 40-months.   WBC has been between 5500 - 7800 from 09/01/2017 - 03/31/2019.  The patient was last seen in the oncology clinic on12/12/2017 by Dr.  Rogue Bussing. At that time, he was doing well and asymptomatic. He denied any blood in his stool or black stool. Appetite was good.   On 03/31/2019, he presented to the [facility] for shortness of breath x 3 weeks. CXR showed a new moderate left pleural effusion with adjacent lingular and left lower lobe atelectasis. The right lung was clear.  There was no pneumothorax or acute osseous abnormality.  There was an unnchanged left chest wall pacemaker. There was stable cardiomegaly.  There was atherosclerotic calcification of the aortic arch and normal pulmonary vascularity.   He was admitted to [facility] from 03/31/2019 - 04/03/2019 with shortness of breath and a left sided pleural effusion. Diagnostic and therapeutic left thoracentesis on 04/03/2019 revealed 900 cc of yellow fluid was removed. His shortness of breath improved. The etiology of his effusion is unclear.   Pleural fluid was consistent with a transudate.  Cytology was negative.  There were reactive mesothelial cells and predominant lymphocytes.  The majority of the lymphocytes stained with CD5 (T-cell marker). There was no co-expression of PAX 5 (B-cell marker), CD5, and CD23.  Gram stain and cultures were negative.  He established care with Dr Otilio Miu on 04/06/2019. He continued on lisinopril for hypertension.   Symptomatically, he is short of breath today. He notes only mild improvement since having the thoracentesis.  He can take deep breaths, but becomes very fatigued after minimal exertion. He will have a CXR on 04/14/2019. He denies any fevers, sweats, to weight loss. He reports that he is trying to lose weight, but it is difficult. He does not  currently use oxygen at home.   He has had a pacemaker for 1.5 years. He has not seen a cardiologist in 1.5 years.    Past Medical History:  Diagnosis Date   Bladder neck obstruction    Cataracts, bilateral    Chronic lymphocytic leukemia (CLL), B-cell (HCC)    Lambda positive  B-cell CLL   Gout    Hypercholesterolemia    Hypertension    Lymphocytosis    Sleep apnea    Thrombocytopenia (HCC)     Past Surgical History:  Procedure Laterality Date   COLONOSCOPY WITH PROPOFOL N/A 09/10/2015   Procedure: COLONOSCOPY WITH PROPOFOL;  Surgeon: Lucilla Lame, MD;  Location: [facility] ENDOSCOPY;  Service: Endoscopy;  Laterality: N/A;   PROSTATE SURGERY  2008    Family History  Problem Relation Age of Onset   Lung cancer Mother 47       deceased   Brain cancer Son 57       pt's only son   Polycythemia Brother        half brother   Prostate cancer Brother     Social History:  reports that he has never smoked. He has never used smokeless tobacco. He reports that he does not drink alcohol or use drugs. he reports he has never smoked. He denies any known exposure to radiation or toxins. He spent several years working in [employer] His wife has dementia. The patient is alone today.   Allergies:  Allergies  Allergen Reactions   Penicillins Other (See Comments)    Has patient had a PCN reaction causing immediate rash, facial/tongue/throat swelling, SOB or lightheadedness with hypotension: Unknown Has patient had a PCN reaction causing severe rash involving mucus membranes or skin necrosis: Unknown Has patient had a PCN reaction that required hospitalization: Unknown Has patient had a PCN reaction occurring within the last 10 years: Unknown If all of the above answers are "NO", then may proceed with Cephalosporin use.    Current Medications: Current Outpatient Medications  Medication Sig Dispense Refill   lisinopril (ZESTRIL) 20 MG tablet Take 1 tablet (20 mg total) by mouth daily. 90 tablet 1   tamsulosin (FLOMAX) 0.4 MG CAPS capsule Take 0.4 mg by mouth daily. Dr Rogers Blocker     No current facility-administered medications for this visit.     Review of Systems  Constitutional: Positive for malaise/fatigue and weight loss (4 pounds since 10/2018).  Negative for chills and fever.  HENT: Negative.  Negative for congestion, hearing loss, sinus pain and sore throat.   Eyes: Negative.  Negative for blurred vision.  Respiratory: Positive for shortness of breath (with minimal exertion). Negative for cough and sputum production.   Cardiovascular: Negative.  Negative for chest pain, palpitations, orthopnea, claudication and leg swelling.       Pacemaker.  Gastrointestinal: Negative.  Negative for abdominal pain, blood in stool, constipation, diarrhea, melena, nausea and vomiting.  Genitourinary: Negative.  Negative for dysuria, frequency, hematuria and urgency.  Musculoskeletal: Negative.  Negative for back pain, joint pain and myalgias.  Skin: Negative.  Negative for rash.  Neurological: Negative.  Negative for dizziness, tingling, sensory change, weakness and headaches.  Endo/Heme/Allergies: Negative.  Does not bruise/bleed easily.  Psychiatric/Behavioral: Negative.  Negative for depression and memory loss. The patient is not nervous/anxious and does not have insomnia.   All other systems reviewed and are negative.  Performance status (ECOG): 2  Blood pressure (!) 144/74, pulse 80, temperature (!) 97.1 F (36.2 C), temperature source Tympanic, resp.  rate 18, height 5\' 7"  (1.702 m), weight 228 lb 8.1 oz (103.7 kg), SpO2 99 %.   Physical Exam  Constitutional: He is oriented to person, place, and time. He appears well-developed and well-nourished. No distress.  HENT:  Head: Normocephalic and atraumatic.  Mouth/Throat: Oropharynx is clear and moist. No oropharyngeal exudate.  White hair and beard.  Wearing a mask.  Eyes: Pupils are equal, round, and reactive to light. Conjunctivae and EOM are normal. No scleral icterus.  Glasses. Blue eyes.   Neck: Normal range of motion. Neck supple. No JVD present.  Cardiovascular: Normal rate and normal heart sounds. An irregular rhythm present.  No murmur heard. Pulmonary/Chest: No respiratory distress.  He has decreased breath sounds in the left lower field. He has no wheezes.  Decreased breath sounds 1/3 up on the left.  Abdominal: Soft. Bowel sounds are normal. He exhibits no distension and no mass. There is no abdominal tenderness. There is no rebound and no guarding.  Musculoskeletal: Normal range of motion.        General: Edema (1+ bilateral lower extremity) present.  Lymphadenopathy:    He has no cervical adenopathy.    He has no axillary adenopathy.       Right: No supraclavicular adenopathy present.       Left: No supraclavicular adenopathy present.  Neurological: He is alert and oriented to person, place, and time.  Skin: Skin is warm and dry. No rash noted. He is not diaphoretic. No erythema. No pallor.  Psychiatric: He has a normal mood and affect. His behavior is normal. Judgment and thought content normal.  Nursing note and vitals reviewed.   No visits with results within 3 Day(s) from this visit.  Latest known visit with results is:  Admission on 03/31/2019, Discharged on 04/03/2019  Component Date Value Ref Range Status   Troponin I 03/31/2019 0.03* <0.03 ng/mL Final   Comment: CRITICAL RESULT CALLED TO, READ BACK BY AND VERIFIED WITH JENNIFER WHITLEY AT 1706 03/31/2019.PMF Performed at [facility], [address] [place], [place] [address]    WBC 03/31/2019 7.9  4.0 - 10.5 K/uL Final   RBC 03/31/2019 5.47  4.22 - 5.81 MIL/uL Final   Hemoglobin 03/31/2019 15.7  13.0 - 17.0 g/dL Final   HCT 03/31/2019 47.8  39.0 - 52.0 % Final   MCV 03/31/2019 87.4  80.0 - 100.0 fL Final   MCH 03/31/2019 28.7  26.0 - 34.0 pg Final   MCHC 03/31/2019 32.8  30.0 - 36.0 g/dL Final   RDW 03/31/2019 13.2  11.5 - 15.5 % Final   Platelets 03/31/2019 169  150 - 400 K/uL Final   nRBC 03/31/2019 0.0  0.0 - 0.2 % Final   Neutrophils Relative % 03/31/2019 67  % Final   Neutro Abs 03/31/2019 5.3  1.7 - 7.7 K/uL Final   Lymphocytes Relative 03/31/2019 22  % Final    Lymphs Abs 03/31/2019 1.7  0.7 - 4.0 K/uL Final   Monocytes Relative 03/31/2019 9  % Final   Monocytes Absolute 03/31/2019 0.7  0.1 - 1.0 K/uL Final   Eosinophils Relative 03/31/2019 1  % Final   Eosinophils Absolute 03/31/2019 0.1  0.0 - 0.5 K/uL Final   Basophils Relative 03/31/2019 1  % Final   Basophils Absolute 03/31/2019 0.1  0.0 - 0.1 K/uL Final   Immature Granulocytes 03/31/2019 0  % Final   Abs Immature Granulocytes 03/31/2019 0.03  0.00 - 0.07 K/uL Final   Performed at [facility], [place], [place] [address]   Sodium 03/31/2019 137  135 - 145 mmol/L Final   Potassium 03/31/2019 4.3  3.5 - 5.1 mmol/L Final   Chloride 03/31/2019 105  98 - 111 mmol/L Final   CO2 03/31/2019 23  22 - 32 mmol/L Final   Glucose, Bld 03/31/2019 98  70 - 99 mg/dL Final   BUN 03/31/2019 17  8 - 23 mg/dL Final   Creatinine, Ser 03/31/2019 1.16  0.61 - 1.24 mg/dL Final   Calcium 03/31/2019 8.6* 8.9 - 10.3 mg/dL Final   Total Protein 03/31/2019 6.2* 6.5 - 8.1 g/dL Final   Albumin 03/31/2019 4.2  3.5 - 5.0 g/dL Final   AST 03/31/2019 19  15 - 41 U/L Final   ALT 03/31/2019 14  0 - 44 U/L Final   Alkaline Phosphatase 03/31/2019 46  38 - 126 U/L Final   Total Bilirubin 03/31/2019 1.4* 0.3 - 1.2 mg/dL Final   GFR calc non Af Amer 03/31/2019 57* >60 mL/min Final   GFR calc Af Amer 03/31/2019 >60  >60 mL/min Final   Anion gap 03/31/2019 9  5 - 15 Final   Performed at [facility], [address] [place], [place] [address]   SARS Coronavirus 2 03/31/2019 NEGATIVE  NEGATIVE Final   Comment: (NOTE) If result is NEGATIVE SARS-CoV-2 target nucleic acids are NOT DETECTED. The SARS-CoV-2 RNA is generally detectable in upper and lower  respiratory specimens during the acute phase of infection. The lowest  concentration of SARS-CoV-2 viral copies this assay can detect is 250  copies / mL. A negative result does not preclude SARS-CoV-2 infection  and  should not be used as the sole basis for treatment or other  patient management decisions.  A negative result may occur with  improper specimen collection / handling, submission of specimen other  than nasopharyngeal swab, presence of viral mutation(s) within the  areas targeted by this assay, and inadequate number of viral copies  (<250 copies / mL). A negative result must be combined with clinical  observations, patient history, and epidemiological information. If result is POSITIVE SARS-CoV-2 target nucleic acids are DETECTED. The SARS-CoV-2 RNA is generally detectable in upper and lower  respiratory specimens dur                          ing the acute phase of infection.  Positive  results are indicative of active infection with SARS-CoV-2.  Clinical  correlation with patient history and other diagnostic information is  necessary to determine patient infection status.  Positive results do  not rule out bacterial infection or co-infection with other viruses. If result is PRESUMPTIVE POSTIVE SARS-CoV-2 nucleic acids MAY BE PRESENT.   A presumptive positive result was obtained on the submitted specimen  and confirmed on repeat testing.  While 2019 novel coronavirus  (SARS-CoV-2) nucleic acids may be present in the submitted sample  additional confirmatory testing may be necessary for epidemiological  and / or clinical management purposes  to differentiate between  SARS-CoV-2 and other Sarbecovirus currently known to infect humans.  If clinically indicated additional testing with an alternate test  methodology [phone]) is advised. The SARS-CoV-2 RNA is generally  detectable in upper and lower respiratory sp                          ecimens during the acute  phase of infection. The expected result  is Negative. Fact Sheet for Patients:  StrictlyIdeas.no Fact Sheet for Healthcare Providers: BankingDealers.co.za This test is not yet approved  or cleared by the [place] FDA and has been authorized for detection and/or diagnosis of SARS-CoV-2 by FDA under an Emergency Use Authorization (EUA).  This EUA will remain in effect (meaning this test can be used) for the duration of the COVID-19 declaration under Section 564(b)(1) of the Act, 21 U.S.C. section 360bbb-3(b)(1), unless the authorization is terminated or revoked sooner. Performed at [facility], [place], [place] [address]    Troponin I 04/01/2019 0.03* <0.03 ng/mL Final   Comment: CRITICAL VALUE NOTED. VALUE IS CONSISTENT WITH PREVIOUSLY REPORTED/CALLED VALUE [facility] Performed at [facility], [place], [place] [address]    Troponin I 04/01/2019 0.04* <0.03 ng/mL Final   Comment: CRITICAL VALUE NOTED. VALUE IS CONSISTENT WITH PREVIOUSLY REPORTED/CALLED VALUE [facility] Performed at [facility], [address] [place], [place] [address]    WBC 04/01/2019 6.8  4.0 - 10.5 K/uL Final   RBC 04/01/2019 5.28  4.22 - 5.81 MIL/uL Final   Hemoglobin 04/01/2019 15.3  13.0 - 17.0 g/dL Final   HCT 04/01/2019 46.7  39.0 - 52.0 % Final   MCV 04/01/2019 88.4  80.0 - 100.0 fL Final   MCH 04/01/2019 29.0  26.0 - 34.0 pg Final   MCHC 04/01/2019 32.8  30.0 - 36.0 g/dL Final   RDW 04/01/2019 13.2  11.5 - 15.5 % Final   Platelets 04/01/2019 160  150 - 400 K/uL Final   nRBC 04/01/2019 0.0  0.0 - 0.2 % Final   Performed at [facility], [address] [place], [place] [address]   Creatinine, Ser 04/01/2019 1.15  0.61 - 1.24 mg/dL Final   GFR calc non Af Amer 04/01/2019 57* >60 mL/min Final   GFR calc Af Amer 04/01/2019 >60  >60 mL/min Final   Performed at [facility], [address] [place], [place] [address]   Sodium 04/01/2019 140  135 - 145 mmol/L Final   Potassium 04/01/2019 4.1  3.5 - 5.1 mmol/L Final   Chloride 04/01/2019 107  98 - 111 mmol/L Final   CO2 04/01/2019 24  22 - 32 mmol/L  Final   Glucose, Bld 04/01/2019 95  70 - 99 mg/dL Final   BUN 04/01/2019 16  8 - 23 mg/dL Final   Creatinine, Ser 04/01/2019 1.09  0.61 - 1.24 mg/dL Final   Calcium 04/01/2019 8.6* 8.9 - 10.3 mg/dL Final   GFR calc non Af Amer 04/01/2019 >60  >60 mL/min Final   GFR calc Af Amer 04/01/2019 >60  >60 mL/min Final   Anion gap 04/01/2019 9  5 - 15 Final   Performed at [facility], [address] [place], [place] [address]   WBC 04/01/2019 6.3  4.0 - 10.5 K/uL Final   RBC 04/01/2019 5.25  4.22 - 5.81 MIL/uL Final   Hemoglobin 04/01/2019 15.1  13.0 - 17.0 g/dL Final   HCT 04/01/2019 45.4  39.0 - 52.0 % Final   MCV 04/01/2019 86.5  80.0 - 100.0 fL Final   MCH 04/01/2019 28.8  26.0 - 34.0 pg Final   MCHC 04/01/2019 33.3  30.0 - 36.0 g/dL Final   RDW 04/01/2019 13.2  11.5 - 15.5 % Final   Platelets 04/01/2019 153  150 - 400 K/uL Final   nRBC 04/01/2019 0.0  0.0 - 0.2 % Final   Performed at [facility],  [place], [place] [address]   Weight 04/01/2019 3,604.8  oz Final   Height 04/01/2019 67  in Final   BP 04/01/2019 110/50  mmHg Final   Prostatic Specific Antigen 04/02/2019 4.33* 0.00 - 4.00 ng/mL Final   Comment: (NOTE) While PSA levels of <=4.0 ng/ml are reported as reference range, some men with levels below 4.0 ng/ml can have prostate cancer and many men with PSA above 4.0 ng/ml do not have prostate cancer.  Other tests such as free PSA, age specific reference ranges, PSA velocity and PSA doubling time may be helpful especially in men less than 58 years old. Performed at [facility], [place] [address] [place], [place] [address]    CYTOLOGY - NON GYN 04/03/2019    Final                   Value:Cytology - Non PAP CASE: [id number] PATIENT: Brent Hoover Non-Gyn Cytology Report     SPECIMEN SUBMITTED: A. Pleural fluid, left  CLINICAL HISTORY: 85 year old male with past medical history of CLL and  thrombocytopenia who presents to the hospital due to shortness of breath; found to have a left-sided pleural effusion.  PRE-OPERATIVE DIAGNOSIS: Pleural effusion  POST-OPERATIVE DIAGNOSIS: Same as above     DIAGNOSIS: A. PLEURAL EFFUSION, LEFT; ULTRASOUND-GUIDED THORACENTESIS: - NEGATIVE FOR MALIGNANCY. - REACTIVE MESOTHELIAL CELLS AND PREDOMINANCE OF LYMPHOCYTES, SEE COMMENT.  Comment: Given the patient's history of CLL and the predominance of lymphocytes in the specimen a limited panel of immunohistochemical stains was performed on the cell block.  The majority of the lymphocytes stain with CD5 (T-cell marker).  There is no co-expression of PAX 5 (B-cell marker), CD5, and CD23.  IHC slides were prepared by [facility] for Molecular Biology a                         nd Pathology, [place], [place]. All controls stained appropriately.  This test was developed and its performance characteristics determined by [facility]. It has not been cleared or approved by the [place] Food and Drug Administration. The FDA does not require this test to go through premarket FDA review. This test is used for clinical purposes. It should not be regarded as investigational or for research. This laboratory is certified under the Clinical Laboratory Improvement Amendments (CLIA) as qualified to perform high complexity clinical laboratory testing.    GROSS DESCRIPTION: A. Labeled: Left pleural Received: Fresh Volume: 900 mL Description: Yellow-orange, cloudy fluid in a 1000 mL glass evacuated container Submitted for ThinPrep and cell block.   Final Diagnosis performed by Quay Burow, MD.   Electronically signed 04/06/2019 2:50:49PM The electronic signature indicates that the named Attending Pathologist has evaluated the specimen  Technical component performed at [facility], [address] [place], [place] [address] Lab: [phone] Dir: Rush Farmer, MD, MMM   Professional component performed at [facility], [facility], [place], [place],  [address] Lab: [phone] Dir: Dellia Nims. Rubinas, MD    Fluid Type-FCT 04/03/2019 PLEURAL   Corrected   CORRECTED ON 06/01 AT 1527: PREVIOUSLY REPORTED AS CYTOPLEU   Color, Fluid 04/03/2019 YELLOW* YELLOW Final   Appearance, Fluid 04/03/2019 HAZY* CLEAR Final   WBC, Fluid 04/03/2019 888  cu mm Final   Neutrophil Count, Fluid 04/03/2019 2  % Final  Lymphs, Fluid 04/03/2019 90  % Final   Monocyte-Macrophage-Serous Fluid 04/03/2019 6  % Final   Eos, Fluid 04/03/2019 2  % Final   Other Cells, Fluid 04/03/2019 0  % Final   Performed at [facility], [address] [place], [place] [address]   Specimen Description 04/03/2019    Final                   Value:PLEURAL Performed at [facility], [address] [place], [place] [address]    Special Requests 04/03/2019    Final                   Value:NONE Performed at [facility], [address] [place], [place] [address]    Gram Stain 04/03/2019    Final                   Value:FEW WBC PRESENT, PREDOMINANTLY MONONUCLEAR NO ORGANISMS SEEN    Culture 04/03/2019    Final                   Value:NO GROWTH 3 DAYS Performed at [facility], [place] [address], [place], [place] [address]    Report Status 04/03/2019 04/07/2019 FINAL   Final   Total protein, fluid 04/03/2019 <3.0  g/dL Final   Fluid Type-FTP 04/03/2019 PLEURAL   Corrected   Comment: Performed at [facility], [address] [place], [place] [address] CORRECTED ON 06/01 AT 8127: PREVIOUSLY REPORTED AS CYTOPLEU    Glucose, Fluid 04/03/2019 87  mg/dL Final   Comment: (NOTE) No normal range established for this test Results should be evaluated in conjunction with serum values    Fluid Type-FGLU 04/03/2019 PLEURAL   Corrected   Comment: Performed at [facility], [address]  [place], [place] [address] CORRECTED ON 06/01 AT 1527: PREVIOUSLY REPORTED AS CYTOPLEU    pH, Body Fluid 04/03/2019 7.5  Not Estab. Final   Comment: (NOTE) This test was developed and its performance characteristics determined by [facility]. It has not been cleared or approved by the Food and Drug Administration. The reference interval(s) and other method performance specifications have not been established for this body fluid. The test result must be integrated into the clinical context for interpretation. Performed At: [facility] [place], [place] [id number] Rush Farmer MD RF:[id number]    Source of Sample 04/03/2019 PLEURAL   Final   Performed at [facility], [address] [place], [place] [address]    Assessment:  Brent Hoover is a 85 y.o. male with chronic lymphocytic leukemia. Flow cytometry revealed CLL, B-cell, CD38 negative. 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  WBC ranged between 69,000 and 134,000 between 06/05/2014 - 07/27/2017.  Abdomen and pelvis CT on 07/29/2017 revealed splenomegaly (volume 1000 cc). There were an abnormal number of scattered lymph nodes in the abdomen and pelvis.   He received Gazyva monthly x 6 from 08/04/2017 - 01/05/2018.   Abdomen and pelvis CT on 03/04/2018 revealed improved splenomegaly (480 cc) and resolved adenopathy.  He has a developmental venous variant (double IVC).  WBC has ranged between 5500 - 7800 from 09/01/2017 - 03/31/2019.  He was admitted to [facility] from 03/31/2019 - 04/03/2019 with shortness of breath and a left sided pleuraleffusion.  Chest CT on 04/03/2019 revealed a moderate loculated left pleural effusion with no mediastinal, hilar or axillary adenopathy.  Left thoracentesis on  04/03/2019 revealed 900 cc of fluid c/w a transudate.  Cytology was negative.  There were reactive mesothelial cells and predominant lymphocytes.  The majority of the lymphocytes stained  with CD5 (T-cell marker). There was no co-expression of PAX 5 (B-cell marker), CD5, and CD23.  Gram stain and cultures were negative.  Symptomatically, he notes shortness of breath.  Exam reveals no adenopathy or hepatosplenomegaly.  He has decreased breath sounds 1/3 up on the left.   Plan: 1.   Chronic lymphocytic leukemia  Review entire medical history, diagnosis and management of CLL.  Discuss treatment to date with [facility] 08/04/2018 - 01/05/2018.  Chest CT on 04/03/2019 revealed no mediastinal, hilar or axillary adenopathy.  Abdomen and pelvis CT on 03/04/2018 revealed mild splenomegaly and resolved adenopathy.  CBC on 03/31/2019 revealed a hematocrit 47.8, hemoglobin 15.7, platelets 169,000, WBC 7900 (ANC 5300; ALC 1700).  Discuss indications for treatment.  Continue surveillance. 2.   Left sided pleural effusion  Etiology is unclear.  By exam, pleural effusion has reaccumulated.  Cytology was negative.  Flow cytometry revealed T cells as compared to patient B-cell CLL.  Discuss follow-up CXR today.  Patient declines. 3.   RTC in 3 months for MD assessment, labs (CBC with diff, CMP, LDH, uric acid).  I discussed the assessment and treatment plan with the patient.  The patient was provided an opportunity to ask questions and all were answered.  The patient agreed with the plan and demonstrated an understanding of the instructions.  The patient was advised to call back if the symptoms worsen or if the condition fails to improve as anticipated.  I provided 25 minutes of face-to-face time during this this encounter and > 50% was spent counseling as documented under my assessment and plan.    Lequita Asal, MD, PhD    04/10/2019, 2:31 PM  I, Molly Dorshimer, am acting as Education administrator for [employer]. Mike Gip, MD, PhD.  I, Florene Brill C. Mike Gip, MD, have reviewed the above documentation for accuracy and completeness, and I agree with the above.

## 2019-04-10 ENCOUNTER — Encounter: Payer: Self-pay | Admitting: Hematology and Oncology

## 2019-04-10 ENCOUNTER — Other Ambulatory Visit: Payer: Self-pay

## 2019-04-10 ENCOUNTER — Inpatient Hospital Stay (HOSPITAL_BASED_OUTPATIENT_CLINIC_OR_DEPARTMENT_OTHER): Payer: Medicare Other | Admitting: Hematology and Oncology

## 2019-04-10 VITALS — BP 144/74 | HR 80 | Temp 97.1°F | Resp 18 | Ht 67.0 in | Wt 228.5 lb

## 2019-04-10 DIAGNOSIS — I10 Essential (primary) hypertension: Secondary | ICD-10-CM | POA: Diagnosis not present

## 2019-04-10 DIAGNOSIS — Z79899 Other long term (current) drug therapy: Secondary | ICD-10-CM | POA: Diagnosis not present

## 2019-04-10 DIAGNOSIS — J90 Pleural effusion, not elsewhere classified: Secondary | ICD-10-CM | POA: Diagnosis not present

## 2019-04-10 DIAGNOSIS — C9110 Chronic lymphocytic leukemia of B-cell type not having achieved remission: Secondary | ICD-10-CM

## 2019-04-10 DIAGNOSIS — G4730 Sleep apnea, unspecified: Secondary | ICD-10-CM

## 2019-04-10 DIAGNOSIS — E7800 Pure hypercholesterolemia, unspecified: Secondary | ICD-10-CM | POA: Diagnosis not present

## 2019-04-10 NOTE — Progress Notes (Signed)
No new issue noted today

## 2019-04-17 ENCOUNTER — Other Ambulatory Visit: Payer: Self-pay

## 2019-04-17 ENCOUNTER — Encounter: Payer: Self-pay | Admitting: Family Medicine

## 2019-04-17 ENCOUNTER — Ambulatory Visit (INDEPENDENT_AMBULATORY_CARE_PROVIDER_SITE_OTHER): Payer: Medicare Other | Admitting: Family Medicine

## 2019-04-17 ENCOUNTER — Ambulatory Visit
Admission: RE | Admit: 2019-04-17 | Discharge: 2019-04-17 | Disposition: A | Discharge status: Home / Self Care | Payer: Medicare Other | Source: Ambulatory Visit | Attending: Family Medicine | Admitting: Family Medicine

## 2019-04-17 ENCOUNTER — Ambulatory Visit
Admission: RE | Admit: 2019-04-17 | Discharge: 2019-04-17 | Disposition: A | Discharge status: Home / Self Care | Payer: Medicare Other | Attending: Family Medicine | Admitting: Family Medicine

## 2019-04-17 VITALS — BP 140/80 | HR 72 | Ht 67.0 in | Wt 229.0 lb

## 2019-04-17 DIAGNOSIS — C9110 Chronic lymphocytic leukemia of B-cell type not having achieved remission: Secondary | ICD-10-CM

## 2019-04-17 DIAGNOSIS — J939 Pneumothorax, unspecified: Secondary | ICD-10-CM

## 2019-04-17 DIAGNOSIS — I2510 Atherosclerotic heart disease of native coronary artery without angina pectoris: Secondary | ICD-10-CM

## 2019-04-17 DIAGNOSIS — J948 Other specified pleural conditions: Secondary | ICD-10-CM | POA: Diagnosis not present

## 2019-04-17 DIAGNOSIS — I2584 Coronary atherosclerosis due to calcified coronary lesion: Secondary | ICD-10-CM

## 2019-04-17 DIAGNOSIS — I700 Atherosclerosis of aorta: Secondary | ICD-10-CM

## 2019-04-17 DIAGNOSIS — J90 Pleural effusion, not elsewhere classified: Secondary | ICD-10-CM

## 2019-04-17 DIAGNOSIS — I7090 Unspecified atherosclerosis: Secondary | ICD-10-CM | POA: Diagnosis not present

## 2019-04-17 NOTE — Progress Notes (Signed)
Date:  04/17/2019   Name:  Brent Hoover   DOB:  12/17/1929   MRN:  [id number]   Chief Complaint: Follow-up (recheck lung sounds and order chest xray for you and corcorran- breathing is "better")  Shortness of Breath This is a new problem. The current episode started more than 1 month ago. The problem occurs intermittently. The problem has been waxing and waning. Pertinent negatives include no abdominal pain, chest pain, claudication, coryza, ear pain, fever, headaches, hemoptysis, leg pain, leg swelling, neck pain, orthopnea, PND, rash, rhinorrhea, sore throat, sputum production, swollen glands, syncope, vomiting or wheezing. Nothing aggravates the symptoms. The patient has no known risk factors for DVT/PE. He has tried nothing for the symptoms. The treatment provided moderate relief. There is no history of allergies, aspirin allergies, asthma, bronchiolitis, CAD, chronic lung disease, COPD, DVT, a heart failure, PE, pneumonia or a recent surgery.    Review of Systems  Constitutional: Negative for chills and fever.  HENT: Negative for drooling, ear discharge, ear pain, rhinorrhea and sore throat.   Respiratory: Positive for shortness of breath. Negative for cough, hemoptysis, sputum production and wheezing.   Cardiovascular: Negative for chest pain, palpitations, orthopnea, claudication, leg swelling, syncope and PND.  Gastrointestinal: Negative for abdominal pain, blood in stool, constipation, diarrhea, nausea and vomiting.  Endocrine: Negative for polydipsia.  Genitourinary: Negative for dysuria, frequency, hematuria and urgency.  Musculoskeletal: Negative for back pain, myalgias and neck pain.  Skin: Negative for rash.  Allergic/Immunologic: Negative for environmental allergies.  Neurological: Negative for dizziness and headaches.  Hematological: Does not bruise/bleed easily.  Psychiatric/Behavioral: Negative for suicidal ideas. The patient is not nervous/anxious.     Patient  Active Problem List   Diagnosis Date Noted  . Atherosclerosis of aorta ([place]) 04/06/2019  . Chronic atrial fibrillation 04/06/2019  . Thrombocytopenia, unspecified ([place]) 04/06/2019  . Long term (current) use of insulin ([place]) 04/06/2019  . Shortness of breath   . HLD (hyperlipidemia) 03/31/2019  . HTN (hypertension) 03/31/2019  . Pleural effusion 03/31/2019  . CKD (chronic kidney disease), stage III ([place]) 03/31/2019  . Elevated troponin 03/31/2019  . Hyponatremia 05/20/2018  . Splenomegaly 07/27/2017  . CLL (chronic lymphocytic leukemia) ([place]) 04/30/2015    Allergies  Allergen Reactions  . Penicillins Other (See Comments)    Has patient had a PCN reaction causing immediate rash, facial/tongue/throat swelling, SOB or lightheadedness with hypotension: Unknown Has patient had a PCN reaction causing severe rash involving mucus membranes or skin necrosis: Unknown Has patient had a PCN reaction that required hospitalization: Unknown Has patient had a PCN reaction occurring within the last 10 years: Unknown If all of the above answers are "NO", then may proceed with Cephalosporin use.    Past Surgical History:  Procedure Laterality Date  . COLONOSCOPY WITH PROPOFOL N/A 09/10/2015   Procedure: COLONOSCOPY WITH PROPOFOL;  Surgeon: Lucilla Lame, MD;  Location: [facility] ENDOSCOPY;  Service: Endoscopy;  Laterality: N/A;  . PROSTATE SURGERY  2008    Social History   Tobacco Use  . Smoking status: Never Smoker  . Smokeless tobacco: Never Used  Substance Use Topics  . Alcohol use: No    Alcohol/week: 0.0 standard drinks  . Drug use: No     Medication list has been reviewed and updated.  Current Meds  Medication Sig  . lisinopril (ZESTRIL) 20 MG tablet Take 1 tablet (20 mg total) by mouth daily.  . tamsulosin (FLOMAX) 0.4 MG CAPS capsule Take 0.4 mg by mouth daily. Dr  Wolfe    [facility] 2/9 Scores 04/06/2019 05/24/2018  PHQ - 2 Score 0 0  PHQ- 9 Score 0 -    BP Readings from Last 3  Encounters:  04/17/19 140/80  04/10/19 (!) 144/74  04/06/19 140/80    Physical Exam Vitals signs and nursing note reviewed.  HENT:     Head: Normocephalic.     Right Ear: Tympanic membrane, ear canal and external ear normal.     Left Ear: Tympanic membrane, ear canal and external ear normal.     Nose: Nose normal.     Mouth/Throat:     Mouth: Mucous membranes are moist.  Eyes:     General: No scleral icterus.       Right eye: No discharge.        Left eye: No discharge.     Conjunctiva/sclera: Conjunctivae normal.     Pupils: Pupils are equal, round, and reactive to light.  Neck:     Musculoskeletal: Normal range of motion and neck supple.     Thyroid: No thyromegaly.     Vascular: No JVD.     Trachea: No tracheal deviation.  Cardiovascular:     Rate and Rhythm: Normal rate and regular rhythm.     Heart sounds: Normal heart sounds. No murmur. No friction rub. No gallop.   Pulmonary:     Effort: Pulmonary effort is normal. No respiratory distress.     Breath sounds: Examination of the left-middle field reveals decreased breath sounds. Examination of the left-lower field reveals decreased breath sounds. Decreased breath sounds present. No wheezing, rhonchi or rales.     Comments: Breath sounds heard in apices Chest:     Chest wall: No mass, tenderness or crepitus. There is dullness to percussion.  Abdominal:     General: Bowel sounds are normal.     Palpations: Abdomen is soft. There is no mass.     Tenderness: There is no abdominal tenderness. There is no guarding or rebound.  Musculoskeletal: Normal range of motion.        General: No tenderness.  Lymphadenopathy:     Cervical: No cervical adenopathy.  Skin:    General: Skin is warm.     Findings: No rash.  Neurological:     Mental Status: He is alert and oriented to person, place, and time.     Cranial Nerves: No cranial nerve deficit.     Deep Tendon Reflexes: Reflexes are normal and symmetric.     Wt Readings  from Last 3 Encounters:  04/17/19 229 lb (103.9 kg)  04/10/19 228 lb 8.1 oz (103.7 kg)  04/06/19 230 lb (104.3 kg)    BP 140/80   Pulse 72   Ht 5\' 7"  (1.702 m)   Wt 229 lb (103.9 kg)   BMI 35.87 kg/m   Assessment and Plan:  1. Pleural effusion Patient has a history of a left pleural effusion which is transudate in nature.  Patient will undergo a chest x-ray to determine if there is been reaccumulation. - DG Chest 2 View; Future  2. Pneumothorax on left And had a slight area of pneumothorax presumably from decrease in vibratory inspiratory volume.  Check chest x-ray to determine resolution. - DG Chest 2 View; Future  3. Atherosclerosis of aorta ([place]) And has a history of atherosclerosis is noted on exam and will continue low-dose aspirin as needed.  4. CLL (chronic lymphocytic leukemia) ([place]) Patient is also followed by oncology/[name] for CLL patient is doing well  but we will check this x-ray and relay information to her.

## 2019-04-21 ENCOUNTER — Other Ambulatory Visit: Payer: Self-pay

## 2019-04-21 ENCOUNTER — Telehealth: Payer: Self-pay

## 2019-04-21 ENCOUNTER — Telehealth: Payer: Self-pay | Admitting: General Practice

## 2019-04-21 DIAGNOSIS — J90 Pleural effusion, not elsewhere classified: Secondary | ICD-10-CM

## 2019-04-21 NOTE — Telephone Encounter (Signed)
Called pt and gave him appt for US thoracentesis- sched for Thursday 25th @ 2:00 [facility] in [place]. Dr Kathlene Cote to read/ pt will receive a call for covid testing appt from [facility]. Pt voices understanding and knows where the med mall is

## 2019-04-21 NOTE — Telephone Encounter (Signed)
-----   Message from Fredderick Severance sent at 04/21/2019 12:01 PM EDT ----- Pt needs this for [place] of thoracentesis

## 2019-04-21 NOTE — Telephone Encounter (Signed)
Called pt to scheduled covid testing. Pt declined scheduling testing at this time.  Pt says that he had a covid test completed 3 weeks ago at the hospital. Advised pt per the message below, pt still declined stating that he wasn't aware that he had to have covid testing also.    Please assist.

## 2019-04-24 ENCOUNTER — Telehealth: Payer: Self-pay | Admitting: Family Medicine

## 2019-04-24 ENCOUNTER — Telehealth: Payer: Self-pay

## 2019-04-24 ENCOUNTER — Other Ambulatory Visit: Payer: Medicare Other

## 2019-04-24 ENCOUNTER — Other Ambulatory Visit: Payer: Self-pay

## 2019-04-24 ENCOUNTER — Other Ambulatory Visit
Admission: RE | Admit: 2019-04-24 | Discharge: 2019-04-24 | Disposition: A | Discharge status: Home / Self Care | Payer: Medicare Other | Source: Ambulatory Visit | Attending: Family Medicine | Admitting: Family Medicine

## 2019-04-24 DIAGNOSIS — Z1159 Encounter for screening for other viral diseases: Secondary | ICD-10-CM | POA: Insufficient documentation

## 2019-04-24 DIAGNOSIS — Z20822 Contact with and (suspected) exposure to covid-19: Secondary | ICD-10-CM

## 2019-04-24 NOTE — Telephone Encounter (Signed)
-----   Message from Fredderick Severance sent at 04/24/2019  7:33 AM EDT ----- I spoke to pt at 7:34 this am and explained to him that he has to have ANOTHER covid test before his [place] on Thursday. He seems to understand now and is awaiting your call. Please call him first thing this am. Thank you

## 2019-04-24 NOTE — Telephone Encounter (Signed)
Patient scheduled today 04/24/19 @ the [facility] preadmission screening site

## 2019-04-24 NOTE — Telephone Encounter (Signed)
Patient is asking for an order for covid test before he can be seen with thoracentesis on the 25th.

## 2019-04-24 NOTE — Telephone Encounter (Signed)
-----   Message from Fredderick Severance sent at 04/24/2019  2:20 PM EDT ----- Please call me  concerning this pt- [phone]

## 2019-04-24 NOTE — Telephone Encounter (Signed)
TC to [name] MA. Brent Hoover had a scheduled appt today @ 1:40pm for Pre-Admission covid testing. The appt was scheduled by Cleatis Polka on 04/21/19 @ 4:14PM but patient was unaware of this appointment.

## 2019-04-24 NOTE — Telephone Encounter (Signed)
Call from Dr Ronnald Ramp office who is upset. Pt needs tested prior to procedure. NT explained to Baxter Flattery from office that are test take 48-72 hours to result. Per tara she wants the test done Appointment scheduled. Baxter Flattery will  Contact the patient. Order placed.

## 2019-04-24 NOTE — Telephone Encounter (Signed)
Pt has appt with [facility] covid testing on today at 3:45

## 2019-04-25 LAB — NOVEL CORONAVIRUS, NAA (HOSP ORDER, SEND-OUT TO REF LAB; TAT 18-24 HRS): SARS-CoV-2, NAA: NOT DETECTED

## 2019-04-27 ENCOUNTER — Ambulatory Visit
Admission: RE | Admit: 2019-04-27 | Discharge: 2019-04-27 | Disposition: A | Discharge status: Home / Self Care | Payer: Medicare Other | Source: Ambulatory Visit | Attending: Interventional Radiology | Admitting: Interventional Radiology

## 2019-04-27 ENCOUNTER — Other Ambulatory Visit: Payer: Self-pay

## 2019-04-27 ENCOUNTER — Ambulatory Visit
Admission: RE | Admit: 2019-04-27 | Discharge: 2019-04-27 | Disposition: A | Discharge status: Home / Self Care | Payer: Medicare Other | Source: Ambulatory Visit | Attending: Family Medicine | Admitting: Family Medicine

## 2019-04-27 DIAGNOSIS — Z9889 Other specified postprocedural states: Secondary | ICD-10-CM

## 2019-04-27 DIAGNOSIS — J90 Pleural effusion, not elsewhere classified: Secondary | ICD-10-CM | POA: Diagnosis not present

## 2019-05-17 DIAGNOSIS — N401 Enlarged prostate with lower urinary tract symptoms: Secondary | ICD-10-CM | POA: Diagnosis not present

## 2019-05-17 DIAGNOSIS — R972 Elevated prostate specific antigen [PSA]: Secondary | ICD-10-CM | POA: Diagnosis not present

## 2019-05-17 LAB — PSA: PSA: 6.1

## 2019-05-19 ENCOUNTER — Other Ambulatory Visit: Payer: Self-pay

## 2019-05-19 ENCOUNTER — Ambulatory Visit (INDEPENDENT_AMBULATORY_CARE_PROVIDER_SITE_OTHER): Payer: Medicare Other | Admitting: Family Medicine

## 2019-05-19 ENCOUNTER — Ambulatory Visit
Admission: RE | Admit: 2019-05-19 | Discharge: 2019-05-19 | Disposition: A | Discharge status: Home / Self Care | Payer: Medicare Other | Source: Ambulatory Visit | Attending: Family Medicine | Admitting: Family Medicine

## 2019-05-19 ENCOUNTER — Other Ambulatory Visit
Admission: RE | Admit: 2019-05-19 | Discharge: 2019-05-19 | Disposition: A | Discharge status: Home / Self Care | Payer: Medicare Other | Source: Ambulatory Visit | Attending: Family Medicine | Admitting: Family Medicine

## 2019-05-19 ENCOUNTER — Encounter: Payer: Self-pay | Admitting: Family Medicine

## 2019-05-19 ENCOUNTER — Ambulatory Visit
Admission: RE | Admit: 2019-05-19 | Discharge: 2019-05-19 | Disposition: A | Discharge status: Home / Self Care | Payer: Medicare Other | Attending: Family Medicine | Admitting: Family Medicine

## 2019-05-19 VITALS — BP 144/78 | HR 85 | Ht 67.0 in | Wt 234.0 lb

## 2019-05-19 DIAGNOSIS — Z1159 Encounter for screening for other viral diseases: Secondary | ICD-10-CM | POA: Diagnosis not present

## 2019-05-19 DIAGNOSIS — Z01818 Encounter for other preprocedural examination: Secondary | ICD-10-CM

## 2019-05-19 DIAGNOSIS — J948 Other specified pleural conditions: Secondary | ICD-10-CM

## 2019-05-19 DIAGNOSIS — I2584 Coronary atherosclerosis due to calcified coronary lesion: Secondary | ICD-10-CM

## 2019-05-19 DIAGNOSIS — R0602 Shortness of breath: Secondary | ICD-10-CM | POA: Diagnosis not present

## 2019-05-19 DIAGNOSIS — I10 Essential (primary) hypertension: Secondary | ICD-10-CM

## 2019-05-19 DIAGNOSIS — I482 Chronic atrial fibrillation, unspecified: Secondary | ICD-10-CM | POA: Diagnosis not present

## 2019-05-19 DIAGNOSIS — I2510 Atherosclerotic heart disease of native coronary artery without angina pectoris: Secondary | ICD-10-CM | POA: Diagnosis not present

## 2019-05-19 LAB — SARS CORONAVIRUS 2 (TAT 6-24 HRS): SARS Coronavirus 2: NEGATIVE

## 2019-05-19 NOTE — Progress Notes (Signed)
LAB   Date:  05/19/2019   Name:  Brent Hoover   DOB:  [birth year]   MRN:  [id number]   Chief Complaint: Shortness of Breath (gained 5lbs in a month- no cardiologist)  Shortness of Breath This is a recurrent problem. The current episode started in the past 7 days (1 week ago). The problem occurs constantly. The problem has been gradually worsening. Pertinent negatives include no abdominal pain, chest pain, claudication, coryza, ear pain, fever, headaches, hemoptysis, leg pain, leg swelling, neck pain, orthopnea, PND, rash, rhinorrhea, sore throat, sputum production, swollen glands, syncope, vomiting or wheezing.    Review of Systems  Constitutional: Negative for chills and fever.  HENT: Negative for drooling, ear discharge, ear pain, rhinorrhea and sore throat.   Respiratory: Positive for shortness of breath. Negative for apnea, cough, hemoptysis, sputum production, choking, chest tightness, wheezing and stridor.   Cardiovascular: Negative for chest pain, palpitations, orthopnea, claudication, leg swelling, syncope and PND.  Gastrointestinal: Negative for abdominal pain, blood in stool, constipation, diarrhea, nausea and vomiting.  Endocrine: Negative for polydipsia.  Genitourinary: Negative for dysuria, frequency, hematuria and urgency.  Musculoskeletal: Negative for back pain, myalgias and neck pain.  Skin: Negative for rash.  Allergic/Immunologic: Negative for environmental allergies.  Neurological: Negative for dizziness and headaches.  Hematological: Does not bruise/bleed easily.  Psychiatric/Behavioral: Negative for suicidal ideas. The patient is not nervous/anxious.     Patient Active Problem List   Diagnosis Date Noted  . Atherosclerosis of aorta ([place]) 04/06/2019  . Chronic atrial fibrillation 04/06/2019  . Thrombocytopenia, unspecified ([place]) 04/06/2019  . Long term (current) use of insulin ([place]) 04/06/2019  . Shortness of breath   . HLD (hyperlipidemia) 03/31/2019  . HTN  (hypertension) 03/31/2019  . Pleural effusion 03/31/2019  . CKD (chronic kidney disease), stage III ([place]) 03/31/2019  . Elevated troponin 03/31/2019  . Hyponatremia 05/20/2018  . Splenomegaly 07/27/2017  . CLL (chronic lymphocytic leukemia) ([place]) 04/30/2015    Allergies  Allergen Reactions  . Penicillins Other (See Comments)    Has patient had a PCN reaction causing immediate rash, facial/tongue/throat swelling, SOB or lightheadedness with hypotension: Unknown Has patient had a PCN reaction causing severe rash involving mucus membranes or skin necrosis: Unknown Has patient had a PCN reaction that required hospitalization: Unknown Has patient had a PCN reaction occurring within the last 10 years: Unknown If all of the above answers are "NO", then may proceed with Cephalosporin use.    Past Surgical History:  Procedure Laterality Date  . COLONOSCOPY WITH PROPOFOL N/A 09/10/2015   Procedure: COLONOSCOPY WITH PROPOFOL;  Surgeon: Lucilla Lame, MD;  Location: [facility] ENDOSCOPY;  Service: Endoscopy;  Laterality: N/A;  . PROSTATE SURGERY  2008    Social History   Tobacco Use  . Smoking status: Never Smoker  . Smokeless tobacco: Never Used  Substance Use Topics  . Alcohol use: No    Alcohol/week: 0.0 standard drinks  . Drug use: No     Medication list has been reviewed and updated.  Current Meds  Medication Sig  . lisinopril (ZESTRIL) 20 MG tablet Take 1 tablet (20 mg total) by mouth daily.  . tamsulosin (FLOMAX) 0.4 MG CAPS capsule Take 0.4 mg by mouth daily. Dr Rogers Blocker    [facility] 2/9 Scores 05/19/2019 04/06/2019 05/24/2018  PHQ - 2 Score 0 0 0  PHQ- 9 Score 0 0 -    BP Readings from Last 3 Encounters:  05/19/19 (!) 144/78  04/27/19 (!) 146/74  04/17/19 140/80  Physical Exam Vitals signs and nursing note reviewed.  HENT:     Head: Normocephalic.     Right Ear: External ear normal.     Left Ear: External ear normal.     Nose: Nose normal.  Eyes:     General: No scleral  icterus.       Right eye: No discharge.        Left eye: No discharge.     Conjunctiva/sclera: Conjunctivae normal.     Pupils: Pupils are equal, round, and reactive to light.  Neck:     Musculoskeletal: Normal range of motion and neck supple.     Thyroid: No thyromegaly.     Vascular: No JVD.     Trachea: No tracheal deviation.  Cardiovascular:     Rate and Rhythm: Normal rate and regular rhythm.     Heart sounds: Normal heart sounds. No murmur. No friction rub. No gallop.   Pulmonary:     Effort: Pulmonary effort is normal. No respiratory distress.     Breath sounds: Normal breath sounds. No wheezing, rhonchi or rales.     Comments: recurrent left pleural effusion Abdominal:     General: Bowel sounds are normal.     Palpations: Abdomen is soft. There is no hepatomegaly, splenomegaly or mass.     Tenderness: There is no abdominal tenderness. There is no guarding or rebound.  Musculoskeletal: Normal range of motion.        General: No tenderness.  Lymphadenopathy:     Cervical: No cervical adenopathy.  Skin:    General: Skin is warm.     Findings: No rash.  Neurological:     Mental Status: He is alert and oriented to person, place, and time.     Cranial Nerves: No cranial nerve deficit.     Deep Tendon Reflexes: Reflexes are normal and symmetric.     Wt Readings from Last 3 Encounters:  05/19/19 234 lb (106.1 kg)  04/17/19 229 lb (103.9 kg)  04/10/19 228 lb 8.1 oz (103.7 kg)    BP (!) 144/78   Pulse 85   Ht 5\' 7"  (1.702 m)   Wt 234 lb (106.1 kg)   SpO2 98%   BMI 36.65 kg/m   Assessment and Plan: 1. Pleural effusion with transudate Patient has recurrent accumulation of pleural effusion on the left side which is been relieved with thoracentesis x2 in the past 4 weeks.  Patient returns today again short of breath exam is consistent and confirmed by x-ray that this is reaccumulated.  Will refer to diagnostic radiology for ultrasound-guided thoracentesis and will proceed  with cardiology consult to determine if there is any cardiac contribution to this. - DG Chest 2 View; Future - US THORACENTESIS ASP PLEURAL SPACE W/IMG GUIDE; Future  2. Chronic atrial fibrillation Patient has history of atrial fibrillation which is been followed in the past at [facility].  Patient is currently with pacemaker.  We are referring to Dr. Nehemiah Massed for evaluation for cardiomyopathy or other attribution by the cardiac for his pleural effusion reaccumulation. - Ambulatory referral to Cardiology  3. Essential hypertension Patient is controlled and this is a chronic condition there is mild elevation of his systolic and we will continue his current regimen - Ambulatory referral to Cardiology  4. Preop examination Patient is been prepped with a COVID termination prior to procedure that is set up for next week with Dr Kathlene Cote. - Novel Coronavirus, NAA ([facility])

## 2019-05-23 ENCOUNTER — Other Ambulatory Visit: Payer: Self-pay

## 2019-05-23 ENCOUNTER — Ambulatory Visit
Admission: RE | Admit: 2019-05-23 | Discharge: 2019-05-23 | Disposition: A | Discharge status: Home / Self Care | Payer: Medicare Other | Source: Ambulatory Visit | Attending: Interventional Radiology | Admitting: Interventional Radiology

## 2019-05-23 ENCOUNTER — Ambulatory Visit
Admission: RE | Admit: 2019-05-23 | Discharge: 2019-05-23 | Disposition: A | Discharge status: Home / Self Care | Payer: Medicare Other | Source: Ambulatory Visit | Attending: Family Medicine | Admitting: Family Medicine

## 2019-05-23 DIAGNOSIS — C9111 Chronic lymphocytic leukemia of B-cell type in remission: Secondary | ICD-10-CM | POA: Diagnosis not present

## 2019-05-23 DIAGNOSIS — J948 Other specified pleural conditions: Secondary | ICD-10-CM | POA: Diagnosis not present

## 2019-05-23 DIAGNOSIS — J90 Pleural effusion, not elsewhere classified: Secondary | ICD-10-CM | POA: Insufficient documentation

## 2019-05-23 DIAGNOSIS — Z856 Personal history of leukemia: Secondary | ICD-10-CM | POA: Insufficient documentation

## 2019-05-23 DIAGNOSIS — Z9889 Other specified postprocedural states: Secondary | ICD-10-CM

## 2019-05-23 LAB — BODY FLUID CELL COUNT WITH DIFFERENTIAL
Eos, Fluid: 0 %
Lymphs, Fluid: 85 %
Monocyte-Macrophage-Serous Fluid: 2 %
Neutrophil Count, Fluid: 13 %
Total Nucleated Cell Count, Fluid: 519 cu mm

## 2019-05-23 LAB — GLUCOSE, PLEURAL OR PERITONEAL FLUID: Glucose, Fluid: 48 mg/dL

## 2019-05-23 LAB — PROTEIN, PLEURAL OR PERITONEAL FLUID: Total protein, fluid: <3 g/dL

## 2019-05-24 LAB — PH, BODY FLUID: pH, Body Fluid: 7.5

## 2019-05-25 LAB — PROTEIN, BODY FLUID (OTHER): Total Protein, Body Fluid Other: 2.2 g/dL

## 2019-05-26 LAB — COMP PANEL: LEUKEMIA/LYMPHOMA

## 2019-05-26 LAB — CYTOLOGY - NON PAP

## 2019-05-27 LAB — BODY FLUID CULTURE: Culture: NO GROWTH

## 2019-05-30 DIAGNOSIS — J90 Pleural effusion, not elsewhere classified: Secondary | ICD-10-CM | POA: Diagnosis not present

## 2019-05-30 DIAGNOSIS — I10 Essential (primary) hypertension: Secondary | ICD-10-CM | POA: Diagnosis not present

## 2019-05-30 DIAGNOSIS — I4891 Unspecified atrial fibrillation: Secondary | ICD-10-CM | POA: Diagnosis not present

## 2019-05-30 DIAGNOSIS — I5022 Chronic systolic (congestive) heart failure: Secondary | ICD-10-CM | POA: Diagnosis not present

## 2019-05-30 DIAGNOSIS — R0602 Shortness of breath: Secondary | ICD-10-CM | POA: Diagnosis not present

## 2019-06-13 DIAGNOSIS — F458 Other somatoform disorders: Secondary | ICD-10-CM | POA: Diagnosis not present

## 2019-06-13 DIAGNOSIS — J90 Pleural effusion, not elsewhere classified: Secondary | ICD-10-CM | POA: Diagnosis not present

## 2019-06-13 DIAGNOSIS — I10 Essential (primary) hypertension: Secondary | ICD-10-CM | POA: Diagnosis not present

## 2019-06-13 DIAGNOSIS — I5022 Chronic systolic (congestive) heart failure: Secondary | ICD-10-CM | POA: Diagnosis not present

## 2019-06-13 DIAGNOSIS — R0602 Shortness of breath: Secondary | ICD-10-CM | POA: Diagnosis not present

## 2019-06-20 ENCOUNTER — Other Ambulatory Visit: Payer: Self-pay | Admitting: Family Medicine

## 2019-06-20 DIAGNOSIS — I10 Essential (primary) hypertension: Secondary | ICD-10-CM

## 2019-06-27 DIAGNOSIS — H0289 Other specified disorders of eyelid: Secondary | ICD-10-CM | POA: Diagnosis not present

## 2019-06-29 DIAGNOSIS — Z45018 Encounter for adjustment and management of other part of cardiac pacemaker: Secondary | ICD-10-CM | POA: Diagnosis not present

## 2019-07-11 ENCOUNTER — Inpatient Hospital Stay: Payer: Medicare Other

## 2019-07-11 ENCOUNTER — Inpatient Hospital Stay: Payer: Medicare Other | Attending: Hematology and Oncology | Admitting: Hematology and Oncology

## 2019-07-18 DIAGNOSIS — I10 Essential (primary) hypertension: Secondary | ICD-10-CM | POA: Diagnosis not present

## 2019-07-18 DIAGNOSIS — J90 Pleural effusion, not elsewhere classified: Secondary | ICD-10-CM | POA: Diagnosis not present

## 2019-07-18 DIAGNOSIS — I5022 Chronic systolic (congestive) heart failure: Secondary | ICD-10-CM | POA: Diagnosis not present

## 2019-07-18 NOTE — Progress Notes (Signed)
[facility]  [address] [place], [place] [address] Phone: [phone]  Fax: [phone]   Clinic Day:  07/20/2019  Referring physician: Juline Patch, MD  Chief Complaint: Brent Hoover is a 85 y.o. male with CLL who is seen for 3 month assessment.  HPI: The patient was last seen in the hematology clinic on 04/10/2019. At that time, he noted shortness of breath. Exam revealed no adenopathy or hepatosplenomegaly. He had decreased breath sounds 1/3 up on the left.   He was seen by Dr. Ronnald Ramp for a follow up on 04/17/2019. He had shortness of breath. Physical exam revealed decreased breath sounds in the left middle and lower field.  Breath sounds were heard in apices.  He underwent left thoracentesis on 04/27/2019. 800 cc of amber fluid was removed.  He was presented to his PCP with shortness of breath on 05/19/2019. Physical exam revealed recurrent left pleural effusion. Patient was referred to diagnostic radiology for ultrasound-guided thoracentesis. He was referred to Dr. Nehemiah Massed for evaluation for cardiomyopathy or other contributing factor for his recurrent pleural effusion. CXR on 05/19/2019 revealed recurrent left pleural effusion.  Ultrasound left thoracentesis on 05/23/2019 revealed 550 mL of pleural fluid. Fluid was more grossly bloody compared to prior thoracentesis procedures. Cytology was negative for malignancy.  Sample had predominant small lymphocytes, few neutrophils, macrophages and eosinophils.  Flow cytometry revealed no phenotypically normal T lymphocytes comprising 98% of the cells.  There is a very small population of neoplastic B cells present, < 1% that were CD5+, CD20 23+, CD43+ and consistent with a prior diagnosis of CLL.  It seems unlikely that the small population of cells was causing the recurrent effusion.  During the interim, he has felt "good". He has shortness of breath with minimal exertion. He denies any adenopathy.   He denies any fevers, sweats, or chills. Patient is on Lasix 40 mg daily.    Past Medical History:  Diagnosis Date  . Bladder neck obstruction   . Cataracts, bilateral   . Chronic lymphocytic leukemia (CLL), B-cell (HCC)    Lambda positive B-cell CLL  . Gout   . Hypercholesterolemia   . Hypertension   . Lymphocytosis   . Sleep apnea   . Thrombocytopenia ([place])     Past Surgical History:  Procedure Laterality Date  . COLONOSCOPY WITH PROPOFOL N/A 09/10/2015   Procedure: COLONOSCOPY WITH PROPOFOL;  Surgeon: Lucilla Lame, MD;  Location: [facility] ENDOSCOPY;  Service: Endoscopy;  Laterality: N/A;  . PROSTATE SURGERY  2008    Family History  Problem Relation Age of Onset  . Lung cancer Mother 57       deceased  . Brain cancer Son 69       pt's only son  . Polycythemia Brother        half brother  . Prostate cancer Brother     Social History:  reports that he has never smoked. He has never used smokeless tobacco. He reports that he does not drink alcohol or use drugs.  he reports he has never smoked. He denies any known exposure to radiation or toxins. He spent several years working in [employer] His wife has dementia. The patient is alone today.  Allergies:  Allergies  Allergen Reactions  . Penicillins Other (See Comments)    Has patient had a PCN reaction causing immediate rash, facial/tongue/throat swelling, SOB or lightheadedness with hypotension: Unknown Has patient had a PCN reaction causing severe rash involving mucus membranes or  skin necrosis: Unknown Has patient had a PCN reaction that required hospitalization: Unknown Has patient had a PCN reaction occurring within the last 10 years: Unknown If all of the above answers are "NO", then may proceed with Cephalosporin use.    Current Medications: Current Outpatient Medications  Medication Sig Dispense Refill  . carvedilol (COREG) 3.125 MG tablet Take 1 tablet by mouth 2 (two) times daily. kowalski    . furosemide  (LASIX) 40 MG tablet Take 1 tablet by mouth daily. kowalski    . lisinopril (ZESTRIL) 20 MG tablet TAKE (1) TABLET BY MOUTH DAILY. 90 tablet 1  . tamsulosin (FLOMAX) 0.4 MG CAPS capsule Take 1 capsule (0.4 mg total) by mouth daily. Dr Rogers Blocker 90 capsule 1   No current facility-administered medications for this visit.     Review of Systems  Constitutional: Negative for chills, fever, malaise/fatigue and weight loss (stable).       Feels "good".  HENT: Negative.  Negative for congestion, hearing loss, sinus pain and sore throat.   Eyes: Negative.  Negative for blurred vision.  Respiratory: Positive for shortness of breath (with minimal exertion). Negative for cough and sputum production.        Recurrent pleural effusion.  Cardiovascular: Negative.  Negative for chest pain, palpitations, orthopnea, claudication and leg swelling.       Pacemaker.  Gastrointestinal: Negative.  Negative for abdominal pain, blood in stool, constipation, diarrhea, melena, nausea and vomiting.  Genitourinary: Negative.  Negative for dysuria, frequency, hematuria and urgency.  Musculoskeletal: Negative.  Negative for back pain, joint pain and myalgias.  Skin: Negative.  Negative for rash.  Neurological: Negative.  Negative for dizziness, tingling, sensory change, weakness and headaches.  Endo/Heme/Allergies: Negative.  Does not bruise/bleed easily.  Psychiatric/Behavioral: Negative.  Negative for depression and memory loss. The patient is not nervous/anxious and does not have insomnia.   All other systems reviewed and are negative.  Performance status (ECOG): 1-2  Vitals Blood pressure (!) 142/82, pulse 79, temperature (!) 96 F (35.6 C), temperature source Tympanic, resp. rate 18, height 5\' 7"  (1.702 m), weight 234 lb 9.1 oz (106.4 kg), SpO2 99 %.  Physical Exam  Constitutional: He is oriented to person, place, and time. He appears well-developed and well-nourished. No distress.  HENT:  Head: Normocephalic  and atraumatic.  Mouth/Throat: Oropharynx is clear and moist. No oropharyngeal exudate.  White hair and beard.  Wearing a mask.  Eyes: Pupils are equal, round, and reactive to light. Conjunctivae and EOM are normal. No scleral icterus.  Glasses. Blue eyes.   Neck: Normal range of motion. Neck supple. No JVD present.  Cardiovascular: Normal rate, regular rhythm and normal heart sounds.  No murmur heard. Pulmonary/Chest: No respiratory distress. He has no wheezes.  Decreased breath sounds left base, improved.  Abdominal: Soft. Bowel sounds are normal. He exhibits no distension and no mass. There is no abdominal tenderness. There is no rebound and no guarding.  Hernia.  Musculoskeletal: Normal range of motion.        General: Edema (trace; bilateral lower extremity) present.  Lymphadenopathy:    He has no cervical adenopathy.    He has no axillary adenopathy.       Right: No supraclavicular adenopathy present.       Left: No supraclavicular adenopathy present.  Neurological: He is alert and oriented to person, place, and time.  Skin: Skin is warm and dry. No rash noted. He is not diaphoretic. No erythema. No pallor.  Psychiatric: He  has a normal mood and affect. His behavior is normal. Judgment and thought content normal.  Nursing note and vitals reviewed.   Appointment on 07/20/2019  Component Date Value Ref Range Status  . LDH 07/20/2019 159  98 - 192 U/L Final   Performed at [facility], [address] [place], [place] [address]  . Uric Acid, Serum 07/20/2019 7.9  3.7 - 8.6 mg/dL Final   Performed at [facility], [address], [place], [place] [address]  . Sodium 07/20/2019 139  135 - 145 mmol/L Final  . Potassium 07/20/2019 3.4* 3.5 - 5.1 mmol/L Final  . Chloride 07/20/2019 105  98 - 111 mmol/L Final  . CO2 07/20/2019 25  22 - 32 mmol/L Final  . Glucose, Bld 07/20/2019 132* 70 - 99 mg/dL Final  . BUN 07/20/2019 16  8 - 23 mg/dL Final  . Creatinine,  Ser 07/20/2019 1.27* 0.61 - 1.24 mg/dL Final  . Calcium 07/20/2019 8.5* 8.9 - 10.3 mg/dL Final  . Total Protein 07/20/2019 6.6  6.5 - 8.1 g/dL Final  . Albumin 07/20/2019 4.0  3.5 - 5.0 g/dL Final  . AST 07/20/2019 18  15 - 41 U/L Final  . ALT 07/20/2019 12  0 - 44 U/L Final  . Alkaline Phosphatase 07/20/2019 44  38 - 126 U/L Final  . Total Bilirubin 07/20/2019 1.5* 0.3 - 1.2 mg/dL Final  . GFR calc non Af Amer 07/20/2019 51* >60 mL/min Final  . GFR calc Af Amer 07/20/2019 59* >60 mL/min Final  . Anion gap 07/20/2019 9  5 - 15 Final   Performed at [facility], [address] [place], [place] [address]  . WBC 07/20/2019 8.1  4.0 - 10.5 K/uL Final  . RBC 07/20/2019 5.38  4.22 - 5.81 MIL/uL Final  . Hemoglobin 07/20/2019 15.2  13.0 - 17.0 g/dL Final  . HCT 07/20/2019 46.3  39.0 - 52.0 % Final  . MCV 07/20/2019 86.1  80.0 - 100.0 fL Final  . MCH 07/20/2019 28.3  26.0 - 34.0 pg Final  . MCHC 07/20/2019 32.8  30.0 - 36.0 g/dL Final  . RDW 07/20/2019 14.6  11.5 - 15.5 % Final  . Platelets 07/20/2019 169  150 - 400 K/uL Final  . nRBC 07/20/2019 0.0  0.0 - 0.2 % Final  . Neutrophils Relative % 07/20/2019 66  % Final  . Neutro Abs 07/20/2019 5.4  1.7 - 7.7 K/uL Final  . Lymphocytes Relative 07/20/2019 21  % Final  . Lymphs Abs 07/20/2019 1.7  0.7 - 4.0 K/uL Final  . Monocytes Relative 07/20/2019 10  % Final  . Monocytes Absolute 07/20/2019 0.8  0.1 - 1.0 K/uL Final  . Eosinophils Relative 07/20/2019 2  % Final  . Eosinophils Absolute 07/20/2019 0.2  0.0 - 0.5 K/uL Final  . Basophils Relative 07/20/2019 1  % Final  . Basophils Absolute 07/20/2019 0.1  0.0 - 0.1 K/uL Final  . Immature Granulocytes 07/20/2019 0  % Final  . Abs Immature Granulocytes 07/20/2019 0.02  0.00 - 0.07 K/uL Final   Performed at [facility], [address] [place], [place] [address]    Assessment:  Brent Hoover is a 85 y.o. male with chronic lymphocytic leukemia. Flow cytometry revealed  CLL, B-cell, CD38 negative. 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  WBCranged between 69,000 and 134,000 between 06/05/2014 - 07/27/2017. Abdomen and pelvis CTon 07/29/2017 revealed splenomegaly (volume 1000 cc).  There were an abnormal number of scattered lymph nodes in the abdomen and pelvis.   He received Gazyvamonthly x 6 from 08/04/2017 - 01/05/2018. Abdomen and pelvis CTon 03/04/2018 revealed improved splenomegaly (480 cc) and resolved adenopathy. He has a developmental venous variant (double IVC). WBC has ranged between 5500 - 7800 from 09/01/2017 - 03/31/2019.  He was admitted to [facility] 03/31/2019 - 04/03/2019 with shortness of breath and a left sided pleuraleffusion. Chest CT on 04/03/2019 revealed a moderate loculated left pleural effusion with no mediastinal, hilar or axillary adenopathy.  Leftthoracentesison 04/03/2019 revealed900 cc of fluidc/wa transudate.Cytology was negative. There were reactive mesothelial cells and predominant lymphocytes. The majority of the lymphocytes stainedwith CD5 (T-cell marker). There wasno co-expression of PAX 5 (B-cell marker), CD5, and CD23.Gram stain and cultures were negative.  He has undergone thoracentesis x 3 (04/03/2019, 06/252020, 05/23/2019) for a recurrent left pleural effusion.  Cytology on 05/23/2019 was negative for malignancy.  Sample had predominant small lymphocytes, few neutrophils, macrophages and eosinophils.  Flow cytometry revealed no phenotypically normal T lymphocytes comprising 98% of the cells.  There is a very small population of neoplastic B cells present, < 1% that were CD5+, CD20 23+, CD43+ and consistent with a prior diagnosis of CLL.  It seems unlikely that the small population of cells was causing the recurrent effusion.  Symptomatically, he is doing well.  He denies any B symptoms.  Exam reveals decreased breath sounds left base, improved.  Plan: 1.   Labs today:  CBC with diff, CMP, LDH, uric acid.  2.  Chronic lymphocytic leukemia             Clinically, he is doing well.             Hematocrit 46.3.  Hemoglobin 15.2.  Platelets 169,000, WBC 8100 (ANC 2 400; ALC 1700).  Continue surveillance. 3.   Left sided pleural effusion             Etiology felt secondary to cardiopulmonary issues.  Thoracentesis on 05/23/2019 revealed a very small population of neoplastic B cells likely due to contamination from blood (fluid bloody). 4.   RTC in 3 months for MD assessment and labs (CBC with diff, CMP, LDH, uric acid).   I discussed the assessment and treatment plan with the patient.  The patient was provided an opportunity to ask questions and all were answered.  The patient agreed with the plan and demonstrated an understanding of the instructions.  The patient was advised to call back if the symptoms worsen or if the condition fails to improve as anticipated.   Lequita Asal, MD, PhD    07/20/2019, 11:53 AM  I, Selena Batten, am acting as scribe for [employer]. Mike Gip, MD, PhD.  I, Melissa C. Mike Gip, MD, have reviewed the above documentation for accuracy and completeness, and I agree with the above.

## 2019-07-19 ENCOUNTER — Ambulatory Visit (INDEPENDENT_AMBULATORY_CARE_PROVIDER_SITE_OTHER): Payer: Medicare Other | Admitting: Family Medicine

## 2019-07-19 ENCOUNTER — Encounter: Payer: Self-pay | Admitting: Family Medicine

## 2019-07-19 ENCOUNTER — Other Ambulatory Visit: Payer: Self-pay

## 2019-07-19 VITALS — BP 134/78 | HR 91 | Ht 67.0 in | Wt 235.0 lb

## 2019-07-19 DIAGNOSIS — J948 Other specified pleural conditions: Secondary | ICD-10-CM | POA: Diagnosis not present

## 2019-07-19 DIAGNOSIS — I2510 Atherosclerotic heart disease of native coronary artery without angina pectoris: Secondary | ICD-10-CM

## 2019-07-19 DIAGNOSIS — R351 Nocturia: Secondary | ICD-10-CM | POA: Diagnosis not present

## 2019-07-19 DIAGNOSIS — I5022 Chronic systolic (congestive) heart failure: Secondary | ICD-10-CM

## 2019-07-19 DIAGNOSIS — I10 Essential (primary) hypertension: Secondary | ICD-10-CM | POA: Diagnosis not present

## 2019-07-19 DIAGNOSIS — N401 Enlarged prostate with lower urinary tract symptoms: Secondary | ICD-10-CM | POA: Diagnosis not present

## 2019-07-19 DIAGNOSIS — Z23 Encounter for immunization: Secondary | ICD-10-CM

## 2019-07-19 DIAGNOSIS — I2584 Coronary atherosclerosis due to calcified coronary lesion: Secondary | ICD-10-CM

## 2019-07-19 MED ORDER — TAMSULOSIN HCL 0.4 MG PO CAPS: 0.4000 mg | ORAL_CAPSULE | Freq: Every day | ORAL | 1 refills | Status: DC

## 2019-07-19 MED ORDER — LISINOPRIL 20 MG PO TABS: ORAL_TABLET | ORAL | 1 refills | Status: DC

## 2019-07-19 NOTE — Progress Notes (Signed)
Date:  07/19/2019   Name:  Brent Hoover   DOB:  December 30, 1929   MRN:  [id number]   Chief Complaint: Follow-up (refill lisinopril), Benign Prostatic Hypertrophy, and influenza vacc need  Benign Prostatic Hypertrophy This is a chronic problem. The current episode started more than 1 year ago. The problem is unchanged. Irritative symptoms include nocturia. Irritative symptoms do not include frequency or urgency. 1-2 x night. Obstructive symptoms do not include dribbling, incomplete emptying, an intermittent stream, a slower stream, straining or a weak stream. Pertinent negatives include no chills, dysuria, genital pain, hematuria, hesitancy, nausea or vomiting. Nothing aggravates the symptoms. Past treatments include tamsulosin. The treatment provided mild relief.  Congestive Heart Failure Presents for follow-up visit. Associated symptoms include fatigue and nocturia. Pertinent negatives include no abdominal pain, chest pain, chest pressure, claudication, edema, near-syncope, orthopnea, palpitations, paroxysmal nocturnal dyspnea, shortness of breath or unexpected weight change. The symptoms have been stable. Compliance with total regimen is 76-100%.  Hypertension This is a chronic problem. The current episode started more than 1 year ago. The problem is controlled. Pertinent negatives include no blurred vision, chest pain, headaches, neck pain, orthopnea, palpitations, PND, shortness of breath or sweats. Past treatments include ACE inhibitors, beta blockers and diuretics. The current treatment provides moderate improvement. There are no compliance problems.  pleuraleffusion.chronic CHF.    Review of Systems  Constitutional: Positive for fatigue. Negative for chills, fever and unexpected weight change.  HENT: Negative for drooling, ear discharge, ear pain and sore throat.   Eyes: Negative for blurred vision.  Respiratory: Negative for cough, shortness of breath and wheezing.   Cardiovascular:  Negative for chest pain, palpitations, orthopnea, claudication, leg swelling, PND and near-syncope.  Gastrointestinal: Negative for abdominal pain, blood in stool, constipation, diarrhea, nausea and vomiting.  Endocrine: Negative for polydipsia.  Genitourinary: Positive for nocturia. Negative for dysuria, frequency, hematuria, hesitancy, incomplete emptying and urgency.  Musculoskeletal: Negative for back pain, myalgias and neck pain.  Skin: Negative for rash.  Allergic/Immunologic: Negative for environmental allergies.  Neurological: Negative for dizziness and headaches.  Hematological: Does not bruise/bleed easily.  Psychiatric/Behavioral: Negative for suicidal ideas. The patient is not nervous/anxious.     Patient Active Problem List   Diagnosis Date Noted  . Atherosclerosis of aorta ([place]) 04/06/2019  . Chronic atrial fibrillation 04/06/2019  . Thrombocytopenia, unspecified ([place]) 04/06/2019  . Long term (current) use of insulin ([place]) 04/06/2019  . Shortness of breath   . HLD (hyperlipidemia) 03/31/2019  . HTN (hypertension) 03/31/2019  . Pleural effusion 03/31/2019  . CKD (chronic kidney disease), stage III ([place]) 03/31/2019  . Elevated troponin 03/31/2019  . Hyponatremia 05/20/2018  . Splenomegaly 07/27/2017  . CLL (chronic lymphocytic leukemia) ([place]) 04/30/2015    Allergies  Allergen Reactions  . Penicillins Other (See Comments)    Has patient had a PCN reaction causing immediate rash, facial/tongue/throat swelling, SOB or lightheadedness with hypotension: Unknown Has patient had a PCN reaction causing severe rash involving mucus membranes or skin necrosis: Unknown Has patient had a PCN reaction that required hospitalization: Unknown Has patient had a PCN reaction occurring within the last 10 years: Unknown If all of the above answers are "NO", then may proceed with Cephalosporin use.    Past Surgical History:  Procedure Laterality Date  . COLONOSCOPY WITH PROPOFOL N/A  09/10/2015   Procedure: COLONOSCOPY WITH PROPOFOL;  Surgeon: Lucilla Lame, MD;  Location: [facility] ENDOSCOPY;  Service: Endoscopy;  Laterality: N/A;  . PROSTATE SURGERY  2008  Social History   Tobacco Use  . Smoking status: Never Smoker  . Smokeless tobacco: Never Used  Substance Use Topics  . Alcohol use: No    Alcohol/week: 0.0 standard drinks  . Drug use: No     Medication list has been reviewed and updated.  Current Meds  Medication Sig  . carvedilol (COREG) 3.125 MG tablet Take 1 tablet by mouth 2 (two) times daily. kowalski  . furosemide (LASIX) 40 MG tablet Take 1 tablet by mouth daily. kowalski  . lisinopril (ZESTRIL) 20 MG tablet TAKE (1) TABLET BY MOUTH DAILY.  . tamsulosin (FLOMAX) 0.4 MG CAPS capsule Take 0.4 mg by mouth daily. Dr Rogers Blocker    [facility] 2/9 Scores 05/19/2019 04/06/2019 05/24/2018  PHQ - 2 Score 0 0 0  PHQ- 9 Score 0 0 -    BP Readings from Last 3 Encounters:  07/19/19 134/78  05/23/19 (!) 189/90  05/19/19 (!) 144/78    Physical Exam Vitals signs and nursing note reviewed.  HENT:     Head: Normocephalic.     Right Ear: External ear normal.     Left Ear: External ear normal.     Nose: Nose normal.  Eyes:     General: No scleral icterus.       Right eye: No discharge.        Left eye: No discharge.     Conjunctiva/sclera: Conjunctivae normal.     Pupils: Pupils are equal, round, and reactive to light.  Neck:     Musculoskeletal: Normal range of motion and neck supple.     Thyroid: No thyroid mass, thyromegaly or thyroid tenderness.     Vascular: No hepatojugular reflux or JVD.     Trachea: No tracheal deviation.  Cardiovascular:     Rate and Rhythm: Normal rate and regular rhythm.     Chest Wall: PMI is not displaced.     Heart sounds: Normal heart sounds, S1 normal and S2 normal. No murmur. No systolic murmur. No diastolic murmur. No friction rub. No gallop. No S3 or S4 sounds.   Pulmonary:     Effort: No respiratory distress.     Breath sounds:  Examination of the left-lower field reveals decreased breath sounds. Decreased breath sounds present. No wheezing, rhonchi or rales.  Abdominal:     General: Bowel sounds are normal.     Palpations: Abdomen is soft. There is no mass.     Tenderness: There is no abdominal tenderness. There is no guarding or rebound.  Musculoskeletal: Normal range of motion.        General: No tenderness.     Right lower leg: No edema.     Left lower leg: No edema.  Lymphadenopathy:     Cervical: No cervical adenopathy.  Skin:    General: Skin is warm.     Findings: No rash.  Neurological:     Mental Status: He is alert and oriented to person, place, and time.     Cranial Nerves: No cranial nerve deficit.     Deep Tendon Reflexes: Reflexes are normal and symmetric.     Wt Readings from Last 3 Encounters:  07/19/19 235 lb (106.6 kg)  05/19/19 234 lb (106.1 kg)  04/17/19 229 lb (103.9 kg)    BP 134/78   Pulse 91   Ht 5\' 7"  (1.702 m)   Wt 235 lb (106.6 kg)   SpO2 98%   BMI 36.81 kg/m   Assessment and Plan:  1. Essential hypertension Chronic.  Controlled.  Continue lisinopril 20 mg once a day.  Will check labs when patient goes to oncology. - lisinopril (ZESTRIL) 20 MG tablet; TAKE (1) TABLET BY MOUTH DAILY.  Dispense: 90 tablet; Refill: 1  2. Chronic systolic congestive heart failure ([place]) Followed with Dr. Nehemiah Massed.  Chronic.  Improving.  Continue Lasix at current dosing as well as we will refill lisinopril at 20 mg once a day. - lisinopril (ZESTRIL) 20 MG tablet; TAKE (1) TABLET BY MOUTH DAILY.  Dispense: 90 tablet; Refill: 1  3. Benign prostatic hyperplasia with nocturia Chronic.  Follows with Dr. Eliberto Ivory.  Continue tamsulosin 0.4 mg once a day. - tamsulosin (FLOMAX) 0.4 MG CAPS capsule; Take 1 capsule (0.4 mg total) by mouth daily. Dr Rogers Blocker  Dispense: 90 capsule; Refill: 1  4. Pleural effusion with transudate Exam is consistent with persistent left pleural effusion.  Continue Lasix and  lisinopril. - lisinopril (ZESTRIL) 20 MG tablet; TAKE (1) TABLET BY MOUTH DAILY.  Dispense: 90 tablet; Refill: 1  5. Influenza vaccine needed Discussed and administered.

## 2019-07-20 ENCOUNTER — Inpatient Hospital Stay (HOSPITAL_BASED_OUTPATIENT_CLINIC_OR_DEPARTMENT_OTHER): Payer: Medicare Other | Admitting: Hematology and Oncology

## 2019-07-20 ENCOUNTER — Inpatient Hospital Stay: Payer: Medicare Other | Attending: Hematology and Oncology

## 2019-07-20 ENCOUNTER — Encounter: Payer: Self-pay | Admitting: Hematology and Oncology

## 2019-07-20 ENCOUNTER — Telehealth: Payer: Self-pay

## 2019-07-20 VITALS — BP 142/82 | HR 79 | Temp 96.0°F | Resp 18 | Ht 67.0 in | Wt 234.6 lb

## 2019-07-20 DIAGNOSIS — C9110 Chronic lymphocytic leukemia of B-cell type not having achieved remission: Secondary | ICD-10-CM

## 2019-07-20 DIAGNOSIS — G4730 Sleep apnea, unspecified: Secondary | ICD-10-CM | POA: Insufficient documentation

## 2019-07-20 DIAGNOSIS — I2510 Atherosclerotic heart disease of native coronary artery without angina pectoris: Secondary | ICD-10-CM

## 2019-07-20 DIAGNOSIS — Z801 Family history of malignant neoplasm of trachea, bronchus and lung: Secondary | ICD-10-CM | POA: Diagnosis not present

## 2019-07-20 DIAGNOSIS — Z79899 Other long term (current) drug therapy: Secondary | ICD-10-CM | POA: Diagnosis not present

## 2019-07-20 DIAGNOSIS — Z808 Family history of malignant neoplasm of other organs or systems: Secondary | ICD-10-CM | POA: Insufficient documentation

## 2019-07-20 DIAGNOSIS — J90 Pleural effusion, not elsewhere classified: Secondary | ICD-10-CM | POA: Diagnosis not present

## 2019-07-20 DIAGNOSIS — I10 Essential (primary) hypertension: Secondary | ICD-10-CM | POA: Diagnosis not present

## 2019-07-20 DIAGNOSIS — E7800 Pure hypercholesterolemia, unspecified: Secondary | ICD-10-CM | POA: Insufficient documentation

## 2019-07-20 DIAGNOSIS — Z8042 Family history of malignant neoplasm of prostate: Secondary | ICD-10-CM | POA: Diagnosis not present

## 2019-07-20 DIAGNOSIS — I2584 Coronary atherosclerosis due to calcified coronary lesion: Secondary | ICD-10-CM

## 2019-07-20 LAB — COMPREHENSIVE METABOLIC PANEL
ALT: 12 U/L (ref 0–44)
AST: 18 U/L (ref 15–41)
Albumin: 4 g/dL (ref 3.5–5.0)
Alkaline Phosphatase: 44 U/L (ref 38–126)
Anion gap: 9 (ref 5–15)
BUN: 16 mg/dL (ref 8–23)
CO2: 25 mmol/L (ref 22–32)
Calcium: 8.5 mg/dL — ABNORMAL LOW (ref 8.9–10.3)
Chloride: 105 mmol/L (ref 98–111)
Creatinine, Ser: 1.27 mg/dL — ABNORMAL HIGH (ref 0.61–1.24)
GFR calc Af Amer: 59 mL/min — ABNORMAL LOW (ref >60)
GFR calc non Af Amer: 51 mL/min — ABNORMAL LOW (ref >60)
Glucose, Bld: 132 mg/dL — ABNORMAL HIGH (ref 70–99)
Potassium: 3.4 mmol/L — ABNORMAL LOW (ref 3.5–5.1)
Sodium: 139 mmol/L (ref 135–145)
Total Bilirubin: 1.5 mg/dL — ABNORMAL HIGH (ref 0.3–1.2)
Total Protein: 6.6 g/dL (ref 6.5–8.1)

## 2019-07-20 LAB — CBC WITH DIFFERENTIAL/PLATELET
Abs Immature Granulocytes: 0.02 10*3/uL (ref 0.00–0.07)
Basophils Absolute: 0.1 10*3/uL (ref 0.0–0.1)
Basophils Relative: 1 %
Eosinophils Absolute: 0.2 10*3/uL (ref 0.0–0.5)
Eosinophils Relative: 2 %
HCT: 46.3 % (ref 39.0–52.0)
Hemoglobin: 15.2 g/dL (ref 13.0–17.0)
Immature Granulocytes: 0 %
Lymphocytes Relative: 21 %
Lymphs Abs: 1.7 10*3/uL (ref 0.7–4.0)
MCH: 28.3 pg (ref 26.0–34.0)
MCHC: 32.8 g/dL (ref 30.0–36.0)
MCV: 86.1 fL (ref 80.0–100.0)
Monocytes Absolute: 0.8 10*3/uL (ref 0.1–1.0)
Monocytes Relative: 10 %
Neutro Abs: 5.4 10*3/uL (ref 1.7–7.7)
Neutrophils Relative %: 66 %
Platelets: 169 10*3/uL (ref 150–400)
RBC: 5.38 MIL/uL (ref 4.22–5.81)
RDW: 14.6 % (ref 11.5–15.5)
WBC: 8.1 10*3/uL (ref 4.0–10.5)
nRBC: 0 % (ref 0.0–0.2)

## 2019-07-20 LAB — URIC ACID: Uric Acid, Serum: 7.9 mg/dL (ref 3.7–8.6)

## 2019-07-20 LAB — LACTATE DEHYDROGENASE: LDH: 159 U/L (ref 98–192)

## 2019-07-20 NOTE — Telephone Encounter (Signed)
Labs forwarded to Dr. Nehemiah Massed to review per Dr. Kem Parkinson request.

## 2019-07-20 NOTE — Progress Notes (Signed)
No new changes noted today 

## 2019-08-15 DIAGNOSIS — R0602 Shortness of breath: Secondary | ICD-10-CM | POA: Diagnosis not present

## 2019-08-15 DIAGNOSIS — I4891 Unspecified atrial fibrillation: Secondary | ICD-10-CM | POA: Diagnosis not present

## 2019-08-15 DIAGNOSIS — I5022 Chronic systolic (congestive) heart failure: Secondary | ICD-10-CM | POA: Diagnosis not present

## 2019-08-15 DIAGNOSIS — I10 Essential (primary) hypertension: Secondary | ICD-10-CM | POA: Diagnosis not present

## 2019-09-27 DIAGNOSIS — G4733 Obstructive sleep apnea (adult) (pediatric): Secondary | ICD-10-CM | POA: Diagnosis not present

## 2019-09-27 DIAGNOSIS — H903 Sensorineural hearing loss, bilateral: Secondary | ICD-10-CM | POA: Diagnosis not present

## 2019-09-28 DIAGNOSIS — I4891 Unspecified atrial fibrillation: Secondary | ICD-10-CM | POA: Diagnosis not present

## 2019-09-28 DIAGNOSIS — Z45018 Encounter for adjustment and management of other part of cardiac pacemaker: Secondary | ICD-10-CM | POA: Diagnosis not present

## 2019-10-23 ENCOUNTER — Ambulatory Visit: Payer: Medicare Other | Admitting: Hematology and Oncology

## 2019-10-23 ENCOUNTER — Other Ambulatory Visit: Payer: Medicare Other

## 2019-10-24 ENCOUNTER — Other Ambulatory Visit: Payer: Self-pay

## 2019-10-24 NOTE — Progress Notes (Signed)
[facility]  [address] [place], [place] [address] Phone: [phone]  Fax: [phone]  Telephone Office Visit:  10/26/2019  Referring physician: Corey Skains, MD  I connected with Olga Coaster on 10/26/19 at 11:31 AM by telephone and verified that I was speaking with the correct person using 2 identifiers.  The patient was at  home.  I discussed the limitations, risk, security and privacy concerns of performing an evaluation and management service by telephone and the availability of in person appointments.  I also discussed with the patient that there may be a patient responsible charge related to this service.  The patient expressed understanding and agreed to proceed.   Chief Complaint: Brent Hoover is a 85 y.o. male with CLL who is seen for 3 month assessment   HPI: The patient was last seen in the hematology clinic on 07/20/2019. At that time, he was doing well.  He denied any B symptoms. Exam revealed decreased breath sounds left base, improved.  Labs on 10/25/2019 revealed a hematocrit 45.8, hemoglobin 15.3, MCV 87.1, platelets 182,000, WBC 8400, ANC 5300. LDH was 155 and uric acid 7.0  Symptomatically, he feels "good".  He can get out and do tasks, but after awhile he tires out.  He walks about 100 feet then rests secondary to shortness of breath.  He had a CXR 1 week ago.  He states there was "no fluid".  He was started on diuretic.   His wife had a heart attack yesterday.   Past Medical History:  Diagnosis Date  . Bladder neck obstruction   . Cataracts, bilateral   . Chronic lymphocytic leukemia (CLL), B-cell (HCC)    Lambda positive B-cell CLL  . Gout   . Hypercholesterolemia   . Hypertension   . Lymphocytosis   . Sleep apnea   . Thrombocytopenia ([place])     Past Surgical History:  Procedure Laterality Date  . COLONOSCOPY WITH PROPOFOL N/A 09/10/2015   Procedure: COLONOSCOPY WITH PROPOFOL;  Surgeon: Lucilla Lame, MD;   Location: [facility] ENDOSCOPY;  Service: Endoscopy;  Laterality: N/A;  . PROSTATE SURGERY  2008    Family History  Problem Relation Age of Onset  . Lung cancer Mother 87       deceased  . Brain cancer Son 79       pt's only son  . Polycythemia Brother        half brother  . Prostate cancer Brother     Social History:  reports that he has never smoked. He has never used smokeless tobacco. He reports that he does not drink alcohol or use drugs. He reports that he does not drink alcohol or use drugs. he reports he has never smoked. He denies any known exposure to radiation or toxins. He spent several years working in [employer] His wife has dementia. The patient is alone today.  Participants in the patient's visit and their role in the encounter included the patient, Samul Dada, scribe, and [employer], CMA, today.  The intake visit was provided by Samul Dada, scribe, and Vito Berger, CMA.   Allergies:  Allergies  Allergen Reactions  . Penicillins Other (See Comments)    Has patient had a PCN reaction causing immediate rash, facial/tongue/throat swelling, SOB or lightheadedness with hypotension: Unknown Has patient had a PCN reaction causing severe rash involving mucus membranes or skin necrosis: Unknown Has patient had a PCN reaction that required hospitalization: Unknown Has patient had a PCN  reaction occurring within the last 10 years: Unknown If all of the above answers are "NO", then may proceed with Cephalosporin use.    Current Medications: Current Outpatient Medications  Medication Sig Dispense Refill  . carvedilol (COREG) 3.125 MG tablet Take 1 tablet by mouth 2 (two) times daily. kowalski    . furosemide (LASIX) 40 MG tablet Take 1 tablet by mouth daily. kowalski    . lisinopril (ZESTRIL) 20 MG tablet TAKE (1) TABLET BY MOUTH DAILY. 90 tablet 1  . tamsulosin (FLOMAX) 0.4 MG CAPS capsule Take 1 capsule (0.4 mg total) by mouth daily. Dr Rogers Blocker 90 capsule 1    No current facility-administered medications for this visit.    Review of Systems  Constitutional: Negative.  Negative for chills, fever, malaise/fatigue and weight loss (stable).       Feels "good".  HENT: Negative.  Negative for congestion, hearing loss, sinus pain and sore throat.   Eyes: Negative.  Negative for blurred vision and double vision.  Respiratory: Positive for shortness of breath (with exertion). Negative for cough and sputum production.        Interval CXR without pleural effusion.  Cardiovascular: Negative.  Negative for chest pain, palpitations, orthopnea, claudication and leg swelling.       Pacemaker.  Gastrointestinal: Negative.  Negative for abdominal pain, blood in stool, constipation, diarrhea, melena, nausea and vomiting.  Genitourinary: Negative.  Negative for dysuria, frequency, hematuria and urgency.  Musculoskeletal: Negative.  Negative for back pain, joint pain and myalgias.  Skin: Negative.  Negative for rash.  Neurological: Negative.  Negative for dizziness, tingling, sensory change, weakness and headaches.  Endo/Heme/Allergies: Negative.  Does not bruise/bleed easily.  Psychiatric/Behavioral: Negative.  Negative for depression and memory loss. The patient is not nervous/anxious and does not have insomnia.   All other systems reviewed and are negative.  Performance status (ECOG): 1-2  Vitals There were no vitals taken for this visit.   Physical Exam  Psychiatric: He has a normal mood and affect. His behavior is normal. Judgment and thought content normal.    Orders Only on 10/25/2019  Component Date Value Ref Range Status  . Uric Acid, Serum 10/25/2019 7.0  3.7 - 8.6 mg/dL Final   Performed at [facility], [address] [place], [place] [address]  . LDH 10/25/2019 155  98 - 192 U/L Final   Performed at [facility], [address] [place], [place] [address]  . Sodium 10/25/2019 136  135 - 145 mmol/L Final  .  Potassium 10/25/2019 4.3  3.5 - 5.1 mmol/L Final  . Chloride 10/25/2019 101  98 - 111 mmol/L Final  . CO2 10/25/2019 27  22 - 32 mmol/L Final  . Glucose, Bld 10/25/2019 90  70 - 99 mg/dL Final  . BUN 10/25/2019 16  8 - 23 mg/dL Final  . Creatinine, Ser 10/25/2019 1.25* 0.61 - 1.24 mg/dL Final  . Calcium 10/25/2019 8.8* 8.9 - 10.3 mg/dL Final  . Total Protein 10/25/2019 6.8  6.5 - 8.1 g/dL Final  . Albumin 10/25/2019 3.9  3.5 - 5.0 g/dL Final  . AST 10/25/2019 18  15 - 41 U/L Final  . ALT 10/25/2019 14  0 - 44 U/L Final  . Alkaline Phosphatase 10/25/2019 45  38 - 126 U/L Final  . Total Bilirubin 10/25/2019 0.7  0.3 - 1.2 mg/dL Final  . GFR calc non Af Amer 10/25/2019 52* >60 mL/min Final  . GFR calc Af Amer 10/25/2019 >60  >  60 mL/min Final  . Anion gap 10/25/2019 8  5 - 15 Final   Performed at [facility], [address], [place], [place] [address]  . WBC 10/25/2019 8.4  4.0 - 10.5 K/uL Final  . RBC 10/25/2019 5.26  4.22 - 5.81 MIL/uL Final  . Hemoglobin 10/25/2019 15.3  13.0 - 17.0 g/dL Final  . HCT 10/25/2019 45.8  39.0 - 52.0 % Final  . MCV 10/25/2019 87.1  80.0 - 100.0 fL Final  . MCH 10/25/2019 29.1  26.0 - 34.0 pg Final  . MCHC 10/25/2019 33.4  30.0 - 36.0 g/dL Final  . RDW 10/25/2019 14.1  11.5 - 15.5 % Final  . Platelets 10/25/2019 182  150 - 400 K/uL Final  . nRBC 10/25/2019 0.0  0.0 - 0.2 % Final  . Neutrophils Relative % 10/25/2019 63  % Final  . Neutro Abs 10/25/2019 5.3  1.7 - 7.7 K/uL Final  . Lymphocytes Relative 10/25/2019 24  % Final  . Lymphs Abs 10/25/2019 2.1  0.7 - 4.0 K/uL Final  . Monocytes Relative 10/25/2019 9  % Final  . Monocytes Absolute 10/25/2019 0.7  0.1 - 1.0 K/uL Final  . Eosinophils Relative 10/25/2019 3  % Final  . Eosinophils Absolute 10/25/2019 0.2  0.0 - 0.5 K/uL Final  . Basophils Relative 10/25/2019 1  % Final  . Basophils Absolute 10/25/2019 0.1  0.0 - 0.1 K/uL Final  . Immature Granulocytes 10/25/2019 0  % Final  . Abs  Immature Granulocytes 10/25/2019 0.03  0.00 - 0.07 K/uL Final   Performed at [facility], [address], [place], [place] [address]    Assessment:  KURAN SAUPE is a 84 y.o. male with chronic lymphocytic leukemia. Flow cytometry revealed CLL, B-cell, CD38 negative. 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  WBCranged between 69,000 and 134,000 between 06/05/2014 - 07/27/2017. Abdomen and pelvis CTon 07/29/2017 revealed splenomegaly (volume 1000 cc). There were an abnormal number of scattered lymph nodes in the abdomen and pelvis.   He received Gazyvamonthly x 6 from 08/04/2017 - 01/05/2018. Abdomen and pelvis CTon 03/04/2018 revealed improved splenomegaly (480 cc) and resolved adenopathy. He has a developmental venous variant (double IVC). WBC has ranged between 5500 - 7800 from 09/01/2017 - 03/31/2019.  He was admitted to [place] 03/31/2019 - 04/03/2019 with shortness of breath and a left sided pleuraleffusion.Chest CTon 04/03/2019 revealed a moderate loculated left pleural effusion with no mediastinal, hilar or axillary adenopathy. Leftthoracentesison 04/03/2019 revealed900 cc of fluidc/wa transudate.Cytologywas negative. There were reactive mesothelial cells and predominant lymphocytes. The majority of the lymphocytes stainedwith CD5(T-cell marker). There wasno co-expression of PAX 5 (B-cell marker), CD5, and CD23.Gram stain and cultures were negative.  He has undergone thoracentesis x 3 (04/03/2019, 06/252020, 05/23/2019) for a recurrent left pleural effusion.  Cytology on 05/23/2019 was negative for malignancy.  Sample had predominant small lymphocytes, few neutrophils, macrophages and eosinophils.  Flow cytometry revealed no phenotypically normal T lymphocytes comprising 98% of the cells.  There is a very small population of neoplastic B cells present, < 1% that were CD5+, CD20 23+, CD43+ and consistent with a prior  diagnosis of CLL.  It seems unlikely that the small population of cells was causing the recurrent effusion.  Symptomatically, he feels "good".  He denies any B symptoms, interval infections, adenopathy, early satiety, bruising or bleeding.  Plan: 1.   Review interim labs from 10/25/2019. 2.   Chronic lymphocytic leukemia Clinically, he is  doing well. Hematocrit 45.8.  Hemoglobin 15.3.  Platelets 182,000, WBC 8400 ([place] 5300; [place] 2100).             Continue surveillance. 3. Left sided pleural effusion Etiology felt secondary to cardiopulmonary issues.             Thoracentesis on 05/23/2019 revealed a very small population of neoplastic B cells likely due to contamination from blood (fluid bloody).  CXR on 10/17/2019 revealed no effusion per patient report. 4.RTC in 6 months for MD assessment and labs (CBC with diff, CMP).  I discussed the assessment and treatment plan with the patient.  The patient was provided an opportunity to ask questions and all were answered.  The patient agreed with the plan and demonstrated an understanding of the instructions.  The patient was advised to call back if the symptoms worsen or if the condition fails to improve as anticipated.  I provided 6 minutes (11:31 AM - 11:37 AM) of non face-to-face time during this this encounter and > 50% was spent counseling as documented under my assessment and plan.    Lequita Asal, MD, PhD    10/26/2019, 11:37 AM  I, Samul Dada, am acting as a scribe for Lequita Asal, MD.  I, Haven Mike Gip, MD, have reviewed the above documentation for accuracy and completeness, and I agree with the above.

## 2019-10-25 ENCOUNTER — Encounter: Payer: Self-pay | Admitting: Hematology and Oncology

## 2019-10-25 ENCOUNTER — Other Ambulatory Visit: Payer: Self-pay

## 2019-10-25 ENCOUNTER — Inpatient Hospital Stay: Payer: Medicare Other | Attending: Hematology and Oncology

## 2019-10-25 DIAGNOSIS — C9110 Chronic lymphocytic leukemia of B-cell type not having achieved remission: Secondary | ICD-10-CM | POA: Diagnosis present

## 2019-10-25 DIAGNOSIS — E7800 Pure hypercholesterolemia, unspecified: Secondary | ICD-10-CM | POA: Diagnosis not present

## 2019-10-25 DIAGNOSIS — J90 Pleural effusion, not elsewhere classified: Secondary | ICD-10-CM | POA: Insufficient documentation

## 2019-10-25 DIAGNOSIS — G4730 Sleep apnea, unspecified: Secondary | ICD-10-CM | POA: Insufficient documentation

## 2019-10-25 DIAGNOSIS — I10 Essential (primary) hypertension: Secondary | ICD-10-CM | POA: Diagnosis not present

## 2019-10-25 DIAGNOSIS — Z79899 Other long term (current) drug therapy: Secondary | ICD-10-CM | POA: Diagnosis not present

## 2019-10-25 LAB — CBC WITH DIFFERENTIAL/PLATELET
Abs Immature Granulocytes: 0.03 10*3/uL (ref 0.00–0.07)
Basophils Absolute: 0.1 10*3/uL (ref 0.0–0.1)
Basophils Relative: 1 %
Eosinophils Absolute: 0.2 10*3/uL (ref 0.0–0.5)
Eosinophils Relative: 3 %
HCT: 45.8 % (ref 39.0–52.0)
Hemoglobin: 15.3 g/dL (ref 13.0–17.0)
Immature Granulocytes: 0 %
Lymphocytes Relative: 24 %
Lymphs Abs: 2.1 10*3/uL (ref 0.7–4.0)
MCH: 29.1 pg (ref 26.0–34.0)
MCHC: 33.4 g/dL (ref 30.0–36.0)
MCV: 87.1 fL (ref 80.0–100.0)
Monocytes Absolute: 0.7 10*3/uL (ref 0.1–1.0)
Monocytes Relative: 9 %
Neutro Abs: 5.3 10*3/uL (ref 1.7–7.7)
Neutrophils Relative %: 63 %
Platelets: 182 10*3/uL (ref 150–400)
RBC: 5.26 MIL/uL (ref 4.22–5.81)
RDW: 14.1 % (ref 11.5–15.5)
WBC: 8.4 10*3/uL (ref 4.0–10.5)
nRBC: 0 % (ref 0.0–0.2)

## 2019-10-25 LAB — COMPREHENSIVE METABOLIC PANEL
ALT: 14 U/L (ref 0–44)
AST: 18 U/L (ref 15–41)
Albumin: 3.9 g/dL (ref 3.5–5.0)
Alkaline Phosphatase: 45 U/L (ref 38–126)
Anion gap: 8 (ref 5–15)
BUN: 16 mg/dL (ref 8–23)
CO2: 27 mmol/L (ref 22–32)
Calcium: 8.8 mg/dL — ABNORMAL LOW (ref 8.9–10.3)
Chloride: 101 mmol/L (ref 98–111)
Creatinine, Ser: 1.25 mg/dL — ABNORMAL HIGH (ref 0.61–1.24)
GFR calc Af Amer: >60 mL/min (ref >60)
GFR calc non Af Amer: 52 mL/min — ABNORMAL LOW (ref >60)
Glucose, Bld: 90 mg/dL (ref 70–99)
Potassium: 4.3 mmol/L (ref 3.5–5.1)
Sodium: 136 mmol/L (ref 135–145)
Total Bilirubin: 0.7 mg/dL (ref 0.3–1.2)
Total Protein: 6.8 g/dL (ref 6.5–8.1)

## 2019-10-25 LAB — URIC ACID: Uric Acid, Serum: 7 mg/dL (ref 3.7–8.6)

## 2019-10-25 LAB — LACTATE DEHYDROGENASE: LDH: 155 U/L (ref 98–192)

## 2019-10-25 NOTE — Progress Notes (Signed)
No new changes noted today. The patient Name and DOB has been verified by phone today. 

## 2019-10-26 ENCOUNTER — Inpatient Hospital Stay (HOSPITAL_BASED_OUTPATIENT_CLINIC_OR_DEPARTMENT_OTHER): Payer: Medicare Other | Admitting: Hematology and Oncology

## 2019-10-26 DIAGNOSIS — C9110 Chronic lymphocytic leukemia of B-cell type not having achieved remission: Secondary | ICD-10-CM | POA: Diagnosis not present

## 2019-11-20 ENCOUNTER — Encounter: Payer: Self-pay | Admitting: Emergency Medicine

## 2019-11-20 ENCOUNTER — Inpatient Hospital Stay: Payer: Medicare Other

## 2019-11-20 ENCOUNTER — Other Ambulatory Visit: Payer: Self-pay

## 2019-11-20 ENCOUNTER — Observation Stay
Admission: EM | Admit: 2019-11-20 | Discharge: 2019-11-21 | Disposition: A | Discharge status: Home / Self Care | Payer: Medicare Other | Attending: Family Medicine | Admitting: Family Medicine

## 2019-11-20 ENCOUNTER — Emergency Department: Payer: Medicare Other

## 2019-11-20 DIAGNOSIS — Z880 Allergy status to penicillin: Secondary | ICD-10-CM | POA: Insufficient documentation

## 2019-11-20 DIAGNOSIS — R778 Other specified abnormalities of plasma proteins: Secondary | ICD-10-CM

## 2019-11-20 DIAGNOSIS — E7800 Pure hypercholesterolemia, unspecified: Secondary | ICD-10-CM | POA: Diagnosis not present

## 2019-11-20 DIAGNOSIS — I130 Hypertensive heart and chronic kidney disease with heart failure and stage 1 through stage 4 chronic kidney disease, or unspecified chronic kidney disease: Secondary | ICD-10-CM | POA: Insufficient documentation

## 2019-11-20 DIAGNOSIS — C9110 Chronic lymphocytic leukemia of B-cell type not having achieved remission: Secondary | ICD-10-CM | POA: Insufficient documentation

## 2019-11-20 DIAGNOSIS — Z20822 Contact with and (suspected) exposure to covid-19: Secondary | ICD-10-CM | POA: Insufficient documentation

## 2019-11-20 DIAGNOSIS — N1831 Chronic kidney disease, stage 3a: Secondary | ICD-10-CM | POA: Insufficient documentation

## 2019-11-20 DIAGNOSIS — Z79899 Other long term (current) drug therapy: Secondary | ICD-10-CM | POA: Insufficient documentation

## 2019-11-20 DIAGNOSIS — R0600 Dyspnea, unspecified: Secondary | ICD-10-CM | POA: Diagnosis not present

## 2019-11-20 DIAGNOSIS — D696 Thrombocytopenia, unspecified: Secondary | ICD-10-CM | POA: Insufficient documentation

## 2019-11-20 DIAGNOSIS — R079 Chest pain, unspecified: Secondary | ICD-10-CM

## 2019-11-20 DIAGNOSIS — G4733 Obstructive sleep apnea (adult) (pediatric): Secondary | ICD-10-CM | POA: Insufficient documentation

## 2019-11-20 DIAGNOSIS — R61 Generalized hyperhidrosis: Secondary | ICD-10-CM | POA: Diagnosis not present

## 2019-11-20 DIAGNOSIS — I5022 Chronic systolic (congestive) heart failure: Secondary | ICD-10-CM | POA: Diagnosis not present

## 2019-11-20 DIAGNOSIS — I313 Pericardial effusion (noninflammatory): Secondary | ICD-10-CM | POA: Diagnosis not present

## 2019-11-20 DIAGNOSIS — J90 Pleural effusion, not elsewhere classified: Secondary | ICD-10-CM | POA: Insufficient documentation

## 2019-11-20 DIAGNOSIS — R0789 Other chest pain: Principal | ICD-10-CM | POA: Insufficient documentation

## 2019-11-20 DIAGNOSIS — J948 Other specified pleural conditions: Secondary | ICD-10-CM

## 2019-11-20 DIAGNOSIS — Z950 Presence of cardiac pacemaker: Secondary | ICD-10-CM | POA: Insufficient documentation

## 2019-11-20 DIAGNOSIS — N183 Chronic kidney disease, stage 3 unspecified: Secondary | ICD-10-CM | POA: Diagnosis present

## 2019-11-20 DIAGNOSIS — R0602 Shortness of breath: Secondary | ICD-10-CM

## 2019-11-20 DIAGNOSIS — I482 Chronic atrial fibrillation, unspecified: Secondary | ICD-10-CM | POA: Insufficient documentation

## 2019-11-20 DIAGNOSIS — E785 Hyperlipidemia, unspecified: Secondary | ICD-10-CM | POA: Insufficient documentation

## 2019-11-20 DIAGNOSIS — I700 Atherosclerosis of aorta: Secondary | ICD-10-CM | POA: Diagnosis not present

## 2019-11-20 DIAGNOSIS — R7989 Other specified abnormal findings of blood chemistry: Secondary | ICD-10-CM | POA: Insufficient documentation

## 2019-11-20 DIAGNOSIS — I10 Essential (primary) hypertension: Secondary | ICD-10-CM

## 2019-11-20 DIAGNOSIS — R52 Pain, unspecified: Secondary | ICD-10-CM | POA: Diagnosis not present

## 2019-11-20 LAB — URINE DRUG SCREEN, QUALITATIVE (ARMC ONLY)
Amphetamines, Ur Screen: NOT DETECTED
Barbiturates, Ur Screen: NOT DETECTED
Benzodiazepine, Ur Scrn: NOT DETECTED
Cannabinoid 50 Ng, Ur ~~LOC~~: NOT DETECTED
Cocaine Metabolite,Ur ~~LOC~~: NOT DETECTED
MDMA (Ecstasy)Ur Screen: NOT DETECTED
Methadone Scn, Ur: NOT DETECTED
Opiate, Ur Screen: NOT DETECTED
Phencyclidine (PCP) Ur S: NOT DETECTED
Tricyclic, Ur Screen: NOT DETECTED

## 2019-11-20 LAB — CBC
HCT: 43.2 % (ref 39.0–52.0)
Hemoglobin: 14.3 g/dL (ref 13.0–17.0)
MCH: 28.9 pg (ref 26.0–34.0)
MCHC: 33.1 g/dL (ref 30.0–36.0)
MCV: 87.4 fL (ref 80.0–100.0)
Platelets: 189 10*3/uL (ref 150–400)
RBC: 4.94 MIL/uL (ref 4.22–5.81)
RDW: 13.8 % (ref 11.5–15.5)
WBC: 9.9 10*3/uL (ref 4.0–10.5)
nRBC: 0 % (ref 0.0–0.2)

## 2019-11-20 LAB — BASIC METABOLIC PANEL
Anion gap: 8 (ref 5–15)
BUN: 13 mg/dL (ref 8–23)
CO2: 28 mmol/L (ref 22–32)
Calcium: 8.4 mg/dL — ABNORMAL LOW (ref 8.9–10.3)
Chloride: 100 mmol/L (ref 98–111)
Creatinine, Ser: 1.33 mg/dL — ABNORMAL HIGH (ref 0.61–1.24)
GFR calc Af Amer: 55 mL/min — ABNORMAL LOW (ref >60)
GFR calc non Af Amer: 48 mL/min — ABNORMAL LOW (ref >60)
Glucose, Bld: 109 mg/dL — ABNORMAL HIGH (ref 70–99)
Potassium: 4 mmol/L (ref 3.5–5.1)
Sodium: 136 mmol/L (ref 135–145)

## 2019-11-20 LAB — RESPIRATORY PANEL BY RT PCR (FLU A&B, COVID)
Influenza A by PCR: NEGATIVE
Influenza B by PCR: NEGATIVE
SARS Coronavirus 2 by RT PCR: NEGATIVE

## 2019-11-20 LAB — TROPONIN I (HIGH SENSITIVITY)
Troponin I (High Sensitivity): 34 ng/L — ABNORMAL HIGH (ref <18)
Troponin I (High Sensitivity): 41 ng/L — ABNORMAL HIGH (ref <18)
Troponin I (High Sensitivity): 59 ng/L — ABNORMAL HIGH (ref <18)
Troponin I (High Sensitivity): 86 ng/L — ABNORMAL HIGH (ref <18)

## 2019-11-20 LAB — FIBRIN DERIVATIVES D-DIMER (ARMC ONLY): Fibrin derivatives D-dimer (ARMC): 1549.8 ng/mL (FEU) — ABNORMAL HIGH (ref 0.00–499.00)

## 2019-11-20 LAB — BRAIN NATRIURETIC PEPTIDE: B Natriuretic Peptide: 191 pg/mL — ABNORMAL HIGH (ref 0.0–100.0)

## 2019-11-20 MED ORDER — HYDRALAZINE HCL 25 MG PO TABS: 25.0000 mg | ORAL_TABLET | Freq: Three times a day (TID) | ORAL | Status: DC | PRN

## 2019-11-20 MED ORDER — TAMSULOSIN HCL 0.4 MG PO CAPS
0.4000 mg | ORAL_CAPSULE | Freq: Every day | ORAL | Status: DC
  Administered 2019-11-20 – 2019-11-21 (×2): 0.4 mg via ORAL
  Filled 2019-11-20 (×2): qty 1

## 2019-11-20 MED ORDER — ACETAMINOPHEN 325 MG PO TABS: 650.0000 mg | ORAL_TABLET | Freq: Four times a day (QID) | ORAL | Status: DC | PRN

## 2019-11-20 MED ORDER — MORPHINE SULFATE (PF) 2 MG/ML IV SOLN: 1.0000 mg | INTRAVENOUS | Status: DC | PRN

## 2019-11-20 MED ORDER — FUROSEMIDE 40 MG PO TABS
40.0000 mg | ORAL_TABLET | Freq: Every day | ORAL | Status: DC
  Administered 2019-11-21: 40 mg via ORAL
  Filled 2019-11-20: qty 1

## 2019-11-20 MED ORDER — HEPARIN SODIUM (PORCINE) 5000 UNIT/ML IJ SOLN
5000.0000 [IU] | Freq: Three times a day (TID) | INTRAMUSCULAR | Status: DC
  Administered 2019-11-20 – 2019-11-21 (×2): 5000 [IU] via SUBCUTANEOUS
  Filled 2019-11-20 (×2): qty 1

## 2019-11-20 MED ORDER — IOHEXOL 350 MG/ML SOLN
75.0000 mL | Freq: Once | INTRAVENOUS | Status: AC | PRN
  Administered 2019-11-20: 75 mL via INTRAVENOUS

## 2019-11-20 MED ORDER — LISINOPRIL 20 MG PO TABS
20.0000 mg | ORAL_TABLET | Freq: Every day | ORAL | Status: DC
  Administered 2019-11-21: 20 mg via ORAL
  Filled 2019-11-20: qty 1

## 2019-11-20 MED ORDER — DM-GUAIFENESIN ER 30-600 MG PO TB12
1.0000 | ORAL_TABLET | Freq: Two times a day (BID) | ORAL | Status: DC
  Administered 2019-11-20 – 2019-11-21 (×2): 1 via ORAL
  Filled 2019-11-20 (×4): qty 1

## 2019-11-20 MED ORDER — ATORVASTATIN CALCIUM 20 MG PO TABS
40.0000 mg | ORAL_TABLET | Freq: Every day | ORAL | Status: DC
  Administered 2019-11-20: 40 mg via ORAL
  Filled 2019-11-20: qty 2

## 2019-11-20 MED ORDER — ASPIRIN EC 81 MG PO TBEC
81.0000 mg | DELAYED_RELEASE_TABLET | Freq: Every day | ORAL | Status: DC
  Administered 2019-11-21: 81 mg via ORAL
  Filled 2019-11-20: qty 1

## 2019-11-20 MED ORDER — CARVEDILOL 3.125 MG PO TABS
3.1250 mg | ORAL_TABLET | Freq: Two times a day (BID) | ORAL | Status: DC
  Administered 2019-11-20 – 2019-11-21 (×2): 3.125 mg via ORAL
  Filled 2019-11-20 (×2): qty 1

## 2019-11-20 MED ORDER — NITROGLYCERIN 0.4 MG SL SUBL: 0.4000 mg | SUBLINGUAL_TABLET | SUBLINGUAL | Status: DC | PRN

## 2019-11-20 NOTE — Progress Notes (Signed)
RN notified by Duayne Cal in lab of troponin 86. Previous troponin was 59. Dr Blaine Hamper notified. No new orders at this time. Pt denies any pain /discomfort.

## 2019-11-20 NOTE — ED Provider Notes (Signed)
[facility] Emergency Department Provider Note       Time seen: ----------------------------------------- 10:07 AM on 11/20/2019 -----------------------------------------   I have reviewed the triage vital signs and the nursing notes.  HISTORY   Chief Complaint No chief complaint on file.    HPI Brent Hoover is a 86 y.o. male with a history of CLL, hyperlipidemia, hypertension, sleep apnea, thrombocytopenia who presents to the ED for sudden onset of chest pain that began prior to arrival.  Patient states he broke out into a sweat and was near syncopal.  He had pain in both arms as well.  The pain appeared to have resolved at this point.  Patient states he is never had this happen before, denies any cardiac history.  Patient also describes recent cough and congestion.  Past Medical History:  Diagnosis Date  . Bladder neck obstruction   . Cataracts, bilateral   . Chronic lymphocytic leukemia (CLL), B-cell (HCC)    Lambda positive B-cell CLL  . Gout   . Hypercholesterolemia   . Hypertension   . Lymphocytosis   . Sleep apnea   . Thrombocytopenia [facility])     Patient Active Problem List   Diagnosis Date Noted  . Atherosclerosis of aorta ([place]) 04/06/2019  . Chronic atrial fibrillation ([place]) 04/06/2019  . Thrombocytopenia, unspecified ([place]) 04/06/2019  . Shortness of breath   . HLD (hyperlipidemia) 03/31/2019  . HTN (hypertension) 03/31/2019  . Pleural effusion 03/31/2019  . CKD (chronic kidney disease), stage III 03/31/2019  . Elevated troponin 03/31/2019  . Hyponatremia 05/20/2018  . Splenomegaly 07/27/2017  . CLL (chronic lymphocytic leukemia) ([place]) 04/30/2015    Past Surgical History:  Procedure Laterality Date  . COLONOSCOPY WITH PROPOFOL N/A 09/10/2015   Procedure: COLONOSCOPY WITH PROPOFOL;  Surgeon: Lucilla Lame, MD;  Location: [facility] ENDOSCOPY;  Service: Endoscopy;  Laterality: N/A;  . PROSTATE SURGERY  2008     Allergies Penicillins  Social History Social History   Tobacco Use  . Smoking status: Never Smoker  . Smokeless tobacco: Never Used  Substance Use Topics  . Alcohol use: No    Alcohol/week: 0.0 standard drinks  . Drug use: No    Review of Systems Constitutional: Negative for fever. Cardiovascular: Positive for chest pain Respiratory: Negative for shortness of breath.  Positive for cough Gastrointestinal: Negative for abdominal pain, vomiting and diarrhea. Musculoskeletal: Negative for back pain. Skin: Positive for diaphoresis Neurological: Negative for headaches, focal weakness or numbness.  All systems negative/normal/unremarkable except as stated in the HPI  ____________________________________________   PHYSICAL EXAM:  VITAL SIGNS: ED Triage Vitals  Enc Vitals Group     BP      Pulse      Resp      Temp      Temp src      SpO2      Weight      Height      Head Circumference      Peak Flow      Pain Score      Pain Loc      Pain Edu?      Excl. in [place]?     Constitutional: Alert and oriented. Well appearing and in no distress. Eyes: Conjunctivae are normal. Normal extraocular movements. Cardiovascular: Normal rate, regular rhythm. No murmurs, rubs, or gallops. Respiratory: Normal respiratory effort without tachypnea nor retractions. Breath sounds are clear and equal bilaterally. No wheezes/rales/rhonchi. Gastrointestinal: Soft and nontender. Normal bowel sounds Musculoskeletal: Nontender with normal range of  motion in extremities. No lower extremity tenderness nor edema. Neurologic:  Normal speech and language. No gross focal neurologic deficits are appreciated.  Skin:  Skin is warm, dry and intact. No rash noted. Psychiatric: Mood and affect are normal. Speech and behavior are normal.  ____________________________________________  EKG: Interpreted by me.  Ventricular paced rhythm with a rate of 81 bpm, normal pacemaker function is  noted  ____________________________________________  ED COURSE:  As part of my medical decision making, I reviewed the following data within the [place] History obtained from family if available, nursing notes, old chart and ekg, as well as notes from prior ED visits. Patient presented for sudden onset of chest pain, diaphoresis and near syncope, we will assess with labs and imaging as indicated at this time.   Procedures  Brent Hoover was evaluated in Emergency Department on 11/20/2019 for the symptoms described in the history of present illness. He was evaluated in the context of the global COVID-19 pandemic, which necessitated consideration that the patient might be at risk for infection with the SARS-CoV-2 virus that causes COVID-19. Institutional protocols and algorithms that pertain to the evaluation of patients at risk for COVID-19 are in a state of rapid change based on information released by regulatory bodies including the CDC and federal and state organizations. These policies and algorithms were followed during the patient's care in the ED.  ____________________________________________   LABS (pertinent positives/negatives)  Labs Reviewed  BASIC METABOLIC PANEL - Abnormal; Notable for the following components:      Result Value   Glucose, Bld 109 (*)    Creatinine, Ser 1.33 (*)    Calcium 8.4 (*)    GFR calc non Af Amer 48 (*)    GFR calc Af Amer 55 (*)    All other components within normal limits  TROPONIN I (HIGH SENSITIVITY) - Abnormal; Notable for the following components:   Troponin I (High Sensitivity) 34 (*)    All other components within normal limits  RESPIRATORY PANEL BY RT PCR (FLU A&B, COVID)  CBC    RADIOLOGY Images were viewed by me  Chest x-ray  IMPRESSION:  1. Small left pleural effusion versus pleural thickening. Mild  increased opacity in left base could represent developing pneumonia  or atelectasis. Recommend short-term  follow-up imaging to ensure  resolution.  ____________________________________________   DIFFERENTIAL DIAGNOSIS   MI, unstable angina, PE, dissection, arrhythmia  FINAL ASSESSMENT AND PLAN  Chest pain, possible pneumonia   Plan: The patient had presented for sudden onset of chest pain with near syncope and diaphoresis. Patient's labs thus far have been nonspecific, troponin is borderline. Patient's imaging revealed a small left pleural effusion with a mild increase opacity in the left base.  Given his symptoms, will admit for formal cardiac work-up.  Currently he is chest pain-free.   Laurence Aly, MD    Note: This note was generated in part or whole with voice recognition software. Voice recognition is usually quite accurate but there are transcription errors that can and very often do occur. I apologize for any typographical errors that were not detected and corrected.     Earleen Newport, MD 11/20/19 1059

## 2019-11-20 NOTE — Plan of Care (Signed)
  Problem: Clinical Measurements: Goal: Cardiovascular complication will be avoided Outcome: Progressing   

## 2019-11-20 NOTE — ED Notes (Signed)
Patient given lunch tray. Sitting up in bed eating at this time. No further needs expressed.

## 2019-11-20 NOTE — H&P (Signed)
History and Physical    Brent Hoover [id number] DOB: 1930/08/01 DOA: 11/20/2019  Referring MD/NP/PA:   PCP: Corey Skains, MD   Patient coming from:  The patient is coming from home.  At baseline, pt is independent for most of ADL.        Chief Complaint: chest discomfort   HPI: Brent Hoover is a 85 y.o. male with medical history significant of hypertension, hyperlipidemia, gout, thrombocytopenia, OSA, CLL, bilateral neck obstruction, CKD stage IIIa, atrial fibrillation, pacemaker placement, sCHF with EF of 40-45%, who presents with chest pain.  Pt states that his chest chest discomfort suddenly when he was sitting in a chair this morning.  It is located in substernal area, moderate, pressure-like, nonradiating.  Associated with diaphoresis.  Pt states that he feels like going to pass out, but did not. He has cough with little mucus production. Denies shortness of breath.  Denies nausea vomiting, diarrhea or abdominal pain no symptoms of UTI or unilateral weakness. Myoview stress test on 06/14/19 demonstrated normal myocardial perfusion without evidence of myocardial ischemia.   ED Course: pt was found to have troponin 34, WBC 9.9, negative RVP for Covid, BNP 191, renal function close to baseline, blood pressure 126/81, heart rate is 63, oxygen saturation 100% on room air, normal temperature, chest x-ray showed small left pleural effusion and some opacity in the left base.  Patient is admitted to telemetry bed as inpatient.  Review of Systems:   General: no fevers, chills, no body weight gain, has fatigue HEENT: no blurry vision, hearing changes or sore throat Respiratory: no dyspnea, has coughing, no wheezing CV: has chest discomfortmuc, no palpitations GI: no nausea, vomiting, abdominal pain, diarrhea, constipation GU: no dysuria, burning on urination, increased urinary frequency, hematuria  Ext: no leg edema Neuro: no unilateral weakness, numbness, or tingling, no vision  change or hearing loss Skin: no rash, no skin tear. MSK: No muscle spasm, no deformity, no limitation of range of movement in spin Heme: No easy bruising.  Travel history: No recent long distant travel.  Allergy:  Allergies  Allergen Reactions  . Penicillins Other (See Comments)    Has patient had a PCN reaction causing immediate rash, facial/tongue/throat swelling, SOB or lightheadedness with hypotension: Unknown Has patient had a PCN reaction causing severe rash involving mucus membranes or skin necrosis: Unknown Has patient had a PCN reaction that required hospitalization: Unknown Has patient had a PCN reaction occurring within the last 10 years: Unknown If all of the above answers are "NO", then may proceed with Cephalosporin use.    Past Medical History:  Diagnosis Date  . Bladder neck obstruction   . Cataracts, bilateral   . Chronic lymphocytic leukemia (CLL), B-cell (HCC)    Lambda positive B-cell CLL  . Gout   . Hypercholesterolemia   . Hypertension   . Lymphocytosis   . Sleep apnea   . Thrombocytopenia ([place])     Past Surgical History:  Procedure Laterality Date  . COLONOSCOPY WITH PROPOFOL N/A 09/10/2015   Procedure: COLONOSCOPY WITH PROPOFOL;  Surgeon: Lucilla Lame, MD;  Location: [facility] ENDOSCOPY;  Service: Endoscopy;  Laterality: N/A;  . PROSTATE SURGERY  2008    Social History:  reports that he has never smoked. He has never used smokeless tobacco. He reports that he does not drink alcohol or use drugs.  Family History:  Family History  Problem Relation Age of Onset  . Lung cancer Mother 47       deceased  .  Brain cancer Son 52       pt's only son  . Polycythemia Brother        half brother  . Prostate cancer Brother      Prior to Admission medications   Medication Sig Start Date End Date Taking? Authorizing Provider  carvedilol (COREG) 3.125 MG tablet Take 1 tablet by mouth 2 (two) times daily. kowalski 05/30/19 05/29/20 Yes [provider]    furosemide (LASIX) 40 MG tablet Take 1 tablet by mouth daily. kowalski 07/18/19 07/17/20 Yes [provider]  lisinopril (ZESTRIL) 20 MG tablet TAKE (1) TABLET BY MOUTH DAILY. 07/19/19  Yes Juline Patch, MD  tamsulosin (FLOMAX) 0.4 MG CAPS capsule Take 1 capsule (0.4 mg total) by mouth daily. Dr Rogers Blocker 07/19/19  Yes Juline Patch, MD    Physical Exam: Vitals:   11/20/19 1013 11/20/19 1100 11/20/19 1130 11/20/19 1159  BP: (!) 141/83 126/81 130/74   Pulse: 70 63 (!) 59   Resp: 18     Temp:    98.4 F (36.9 C)  TempSrc:    Oral  SpO2: 100% 100% 95%   Weight: 106.6 kg     Height: 5\' 7"  (1.702 m)      General: Not in acute distress HEENT:       Eyes: PERRL, EOMI, no scleral icterus.       ENT: No discharge from the ears and nose, no pharynx injection, no tonsillar enlargement.        Neck: No JVD, no bruit, no mass felt. Heme: No neck lymph node enlargement. Cardiac: S1/S2, RRR, No murmurs, No gallops or rubs. Respiratory: No rales, wheezing, rhonchi or rubs. GI: Soft, nondistended, nontender, no rebound pain, no organomegaly, BS present. GU: No hematuria Ext: No pitting leg edema bilaterally. 2+DP/PT pulse bilaterally. Musculoskeletal: No joint deformities, No joint redness or warmth, no limitation of ROM in spin. Skin: No rashes.  Neuro: Alert, oriented X3, cranial nerves II-XII grossly intact, moves all extremities normally.   Psych: Patient is not psychotic, no suicidal or hemocidal ideation.  Labs on Admission: I have personally reviewed following labs and imaging studies  CBC: Recent Labs  Lab 11/20/19 1018  WBC 9.9  HGB 14.3  HCT 43.2  MCV 87.4  PLT 99991111   Basic Metabolic Panel: Recent Labs  Lab 11/20/19 1018  NA 136  K 4.0  CL 100  CO2 28  GLUCOSE 109*  BUN 13  CREATININE 1.33*  CALCIUM 8.4*   GFR: Estimated Creatinine Clearance: 45.6 mL/min (A) (by C-G formula based on SCr of 1.33 mg/dL (H)). Liver Function Tests: No results for input(s):  AST, ALT, ALKPHOS, BILITOT, PROT, ALBUMIN in the last 168 hours. No results for input(s): LIPASE, AMYLASE in the last 168 hours. No results for input(s): AMMONIA in the last 168 hours. Coagulation Profile: No results for input(s): INR, PROTIME in the last 168 hours. Cardiac Enzymes: No results for input(s): CKTOTAL, CKMB, CKMBINDEX, TROPONINI in the last 168 hours. BNP (last 3 results) No results for input(s): PROBNP in the last 8760 hours. HbA1C: No results for input(s): HGBA1C in the last 72 hours. CBG: No results for input(s): GLUCAP in the last 168 hours. Lipid Profile: No results for input(s): CHOL, HDL, LDLCALC, TRIG, CHOLHDL, LDLDIRECT in the last 72 hours. Thyroid Function Tests: No results for input(s): TSH, T4TOTAL, FREET4, T3FREE, THYROIDAB in the last 72 hours. Anemia Panel: No results for input(s): VITAMINB12, FOLATE, FERRITIN, TIBC, IRON, RETICCTPCT in the last 72 hours.  Urine analysis:    Component Value Date/Time   COLORURINE STRAW (A) 12/07/2018 1053   APPEARANCEUR CLEAR 12/07/2018 1053   APPEARANCEUR CLEAR 03/08/2012 0807   LABSPEC 1.010 12/07/2018 1053   LABSPEC 1.015 03/08/2012 0807   PHURINE 6.5 12/07/2018 1053   GLUCOSEU NEGATIVE 12/07/2018 1053   GLUCOSEU NEGATIVE 03/08/2012 0807   HGBUR TRACE (A) 12/07/2018 [place] 12/07/2018 [place] 03/08/2012 0807   KETONESUR NEGATIVE 12/07/2018 1053   PROTEINUR NEGATIVE 12/07/2018 1053   NITRITE NEGATIVE 12/07/2018 1053   LEUKOCYTESUR TRACE (A) 12/07/2018 1053   LEUKOCYTESUR NEGATIVE 03/08/2012 0807   Sepsis Labs: @LABRCNTIP (procalcitonin:4,lacticidven:4) ) Recent Results (from the past 240 hour(s))  Respiratory Panel by RT PCR (Flu A&B, Covid) - Nasopharyngeal Swab     Status: None   Collection Time: 11/20/19 10:18 AM   Specimen: Nasopharyngeal Swab  Result Value Ref Range Status   SARS Coronavirus 2 by RT PCR NEGATIVE NEGATIVE Final    Comment: (NOTE) SARS-CoV-2  target nucleic acids are NOT DETECTED. The SARS-CoV-2 RNA is generally detectable in upper respiratoy specimens during the acute phase of infection. The lowest concentration of SARS-CoV-2 viral copies this assay can detect is 131 copies/mL. A negative result does not preclude SARS-Cov-2 infection and should not be used as the sole basis for treatment or other patient management decisions. A negative result may occur with  improper specimen collection/handling, submission of specimen other than nasopharyngeal swab, presence of viral mutation(s) within the areas targeted by this assay, and inadequate number of viral copies (<131 copies/mL). A negative result must be combined with clinical observations, patient history, and epidemiological information. The expected result is Negative. Fact Sheet for Patients:  PinkCheek.be Fact Sheet for Healthcare Providers:  GravelBags.it This test is not yet ap proved or cleared by the [place] FDA and  has been authorized for detection and/or diagnosis of SARS-CoV-2 by FDA under an Emergency Use Authorization (EUA). This EUA will remain  in effect (meaning this test can be used) for the duration of the COVID-19 declaration under Section 564(b)(1) of the Act, 21 U.S.C. section 360bbb-3(b)(1), unless the authorization is terminated or revoked sooner.    Influenza A by PCR NEGATIVE NEGATIVE Final   Influenza B by PCR NEGATIVE NEGATIVE Final    Comment: (NOTE) The Xpert Xpress SARS-CoV-2/FLU/RSV assay is intended as an aid in  the diagnosis of influenza from Nasopharyngeal swab specimens and  should not be used as a sole basis for treatment. Nasal washings and  aspirates are unacceptable for Xpert Xpress SARS-CoV-2/FLU/RSV  testing. Fact Sheet for Patients: PinkCheek.be Fact Sheet for Healthcare Providers: GravelBags.it This test is  not yet approved or cleared by the [place] FDA and  has been authorized for detection and/or diagnosis of SARS-CoV-2 by  FDA under an Emergency Use Authorization (EUA). This EUA will remain  in effect (meaning this test can be used) for the duration of the  Covid-19 declaration under Section 564(b)(1) of the Act, 21  U.S.C. section 360bbb-3(b)(1), unless the authorization is  terminated or revoked. Performed at [facility], [address], [place], [place] [address]      Radiological Exams on Admission: DG Chest [facility] 1 View  Result Date: 11/20/2019 CLINICAL DATA:  Sudden onset of chest pain.  Diaphoresis. EXAM: PORTABLE CHEST 1 VIEW COMPARISON:  May 23, 2019 FINDINGS: The heart size is borderline to mildly enlarged. Pleural thickening versus a left-sided pleural effusion is identified, unchanged. Mild opacity in left  base is concerning for developing infiltrate which could be atelectasis or pneumonia. No pneumothorax. The right lung is clear. The hila and mediastinum are unchanged. Stable pacemaker. IMPRESSION: 1. Small left pleural effusion versus pleural thickening. Mild increased opacity in left base could represent developing pneumonia or atelectasis. Recommend short-term follow-up imaging to ensure resolution. Electronically Signed   By: Dorise Bullion III M.D   On: 11/20/2019 10:29     EKG: Independently reviewed.  Seems to be paced rhythm, QTC 525.  Assessment/Plan Principal Problem:   Chest discomfort Active Problems:   CLL (chronic lymphocytic leukemia) (HCC)   HLD (hyperlipidemia)   HTN (hypertension)   CKD (chronic kidney disease), stage III   Elevated troponin   Chronic atrial fibrillation (HCC)   Chronic systolic CHF (congestive heart failure) (HCC)   Chest discomfort and elevated trop: Cardiology, Dr. Ubaldo Glassing was consulted.  Patient has positive D-dimer 1549, we needed to rule out a PE.  - will admit to tele bed as inpt - Trend Trop - Repeat EKG in  the am  - prn Nitroglycerin, Morphine, and aspirin, lipitor  - Risk factor stratification: will check FLP and A1C  - check UDS -will get CTA to r/u PE -LE doppler to r/o DVT due positive D-dimer  CLL (chronic lymphocytic leukemia) ([place]): -f/u with oncology  HLD (hyperlipidemia): Not taking medications at home -Follow-up FLP -Started Lipitor  HTN:  -Continue home medications: Coreg, lisinopril -Patient is also on Lasix -hydralazine prn  CKD (chronic kidney disease), stage IIIa: Close to baseline.  Recent creatinine 1.25 on 03/25/2019.  Her creatinine is 1.33, BUN 13. -Follow-up with BMP  Chronic atrial fibrillation ([place]): pt is is not on anticoagulants. -continue coreg  Chronic systolic CHF (congestive heart failure) ([place]): 2D echo on 04/01/2019 showed EF of 40-45%.  No leg edema or JVD.  CHF is compensated. -Continue home Lasix   Inpatient status:  # Patient requires inpatient status due to high intensity of service, high risk for further deterioration and high frequency of surveillance required.  I certify that at the point of admission it is my clinical judgment that the patient will require inpatient hospital care spanning beyond 2 midnights from the point of admission.  . This patient has multiple chronic comorbidities including hypertension, hyperlipidemia, gout, thrombocytopenia, OSA, CLL, bilateral neck obstruction, CKD stage IIIa, atrial fibrillation, pacemaker placement, sCHF with EF of 40-45% .  Marland Kitchen Now patient has presenting with chest pain, elevated troponin for possible non-STEMI, . The initial radiographic and laboratory data are worrisome because of elevated troponin . Current medical needs: please see my assessment and plan . Predictability of an adverse outcome (risk): Patient has multiple comorbidities as listed above. Now presents with chest pain, elevated troponin for possible non-STEMI. Patient's presentation is highly complicated.  Patient is at high risk of  deteriorating.  Will need to be treated in hospital for at least 2 days.     DVT ppx: SQ Heparin   Code Status: Full code Family Communication: None at bed side.   Disposition Plan:  Anticipate discharge back to previous home environment Consults called: Dr. Ubaldo Glassing of cardiology Admission status: Med-surg bed as inpt   Date of Service 11/20/2019    [place] [facility]   If 7PM-7AM, please contact night-coverage [email or web address] Password [facility] 11/20/2019, 1:43 PM

## 2019-11-20 NOTE — Consult Note (Addendum)
CARDIOLOGY CONSULT NOTE               Patient ID: Brent Hoover MRN: [id number] DOB/AGE: 04/07/1930 86 y.o.  Admit date: 11/20/2019 Referring Physician Dr. Ivor Hoover Primary Physician  Primary Cardiologist Dr. Nehemiah Hoover  Reason for Consultation Chest pain, elevated troponin   HPI: Brent Hoover is an 86 year old male with a past medical history significant for chronic HFrEF, history of bradycardia and atrial fibrillation s/p permanent pacemaker implantation at [facility], mild AS, hyperlipidemia, hypertension, and history of CLL with recurrent pleural effusions who presented to the ED on 11/20/19 for an acute onset of chest "emptiness" with radiation to bilateral arms, diaphoresis, as well as near syncope.  The episode lasted approximately 15 minutes and resolved without intervention. He is currently chest pain free and also denies shortness of breath, palpitations, lower extremity swelling, orthopnea, PND, dizziness, or lightheadedness.  Workup in the ED thus far has been significant for ECG revealing intraventricular conduction delay, no change from previous ECG, high sensitivity troponin elevated x 1, 34, and chest xray revealing a small left pleural effusion with evidence of increased opacity in the left base - no evidence of vascular congestion.   He is followed in outpatient cardiology by Dr. Nehemiah Hoover.  Stress test on 06/14/19 was negative for reversible ischemia. Most recent echocardiogram on 06/13/19 revealed mild to moderate reduction in LV systolic function with an EF estimated between 40-45% with moderate LVH, mild MR, PR and TR. Left atrium was severely enlarged.   Review of systems complete and found to be negative unless listed above     Past Medical History:  Diagnosis Date  . Bladder neck obstruction   . Cataracts, bilateral   . Chronic lymphocytic leukemia (CLL), B-cell (HCC)    Lambda positive B-cell CLL  . Gout   . Hypercholesterolemia   . Hypertension   . Lymphocytosis     . Sleep apnea   . Thrombocytopenia ([place])     Past Surgical History:  Procedure Laterality Date  . COLONOSCOPY WITH PROPOFOL N/A 09/10/2015   Procedure: COLONOSCOPY WITH PROPOFOL;  Surgeon: Brent Lame, MD;  Location: [facility] ENDOSCOPY;  Service: Endoscopy;  Laterality: N/A;  . PROSTATE SURGERY  2008    (Not in a Hoover admission)  Social History   Socioeconomic History  . Marital status: Married    Spouse name: Not on file  . Number of children: Not on file  . Years of education: Not on file  . Highest education level: Not on file  Occupational History  . Not on file  Tobacco Use  . Smoking status: Never Smoker  . Smokeless tobacco: Never Used  Substance and Sexual Activity  . Alcohol use: No    Alcohol/week: 0.0 standard drinks  . Drug use: No  . Sexual activity: Not Currently  Other Topics Concern  . Not on file  Social History Narrative  . Not on file   Social Determinants of Health   Financial Resource Strain:   . Difficulty of Paying Living Expenses: Not on file  Food Insecurity:   . Worried About Charity fundraiser in the Last Year: Not on file  . Ran Out of Food in the Last Year: Not on file  Transportation Needs:   . Lack of Transportation (Medical): Not on file  . Lack of Transportation (Non-Medical): Not on file  Physical Activity:   . Days of Exercise per Week: Not on file  . Minutes of Exercise per Session: Not  on file  Stress:   . Feeling of Stress : Not on file  Social Connections:   . Frequency of Communication with Friends and Family: Not on file  . Frequency of Social Gatherings with Friends and Family: Not on file  . Attends Religious Services: Not on file  . Active Member of Clubs or Organizations: Not on file  . Attends Archivist Meetings: Not on file  . Marital Status: Not on file  Intimate Partner Violence:   . Fear of Current or Ex-Partner: Not on file  . Emotionally Abused: Not on file  . Physically Abused: Not on file  .  Sexually Abused: Not on file    Family History  Problem Relation Age of Onset  . Lung cancer Mother 61       deceased  . Brain cancer Son 15       pt's only son  . Polycythemia Brother        half brother  . Prostate cancer Brother       Review of systems complete and found to be negative unless listed above      PHYSICAL EXAM  General: Well developed, well nourished, in no acute distress HEENT:  Normocephalic and atramatic Neck:  No JVD.  Lungs: Clear bilaterally to auscultation and percussion. Heart: HRRR . Normal S1 and S2 without gallops or murmurs.  Abdomen: Bowel sounds are positive, abdomen soft and non-tender  Msk:  Back norma.  Normal strength and tone for age. Extremities: No clubbing, cyanosis or edema.   Neuro: Alert and oriented X 3. Psych:  Good affect, responds appropriately  Labs:   Lab Results  Component Value Date   WBC 9.9 11/20/2019   HGB 14.3 11/20/2019   HCT 43.2 11/20/2019   MCV 87.4 11/20/2019   PLT 189 11/20/2019    Recent Labs  Lab 11/20/19 1018  NA 136  K 4.0  CL 100  CO2 28  BUN 13  CREATININE 1.33*  CALCIUM 8.4*  GLUCOSE 109*   Lab Results  Component Value Date   CKTOTAL <5 (L) 10/05/2017   CKMB 4.0 10/05/2017   TROPONINI 0.04 (HH) 04/01/2019   No results found for: CHOL No results found for: HDL No results found for: LDLCALC No results found for: TRIG No results found for: CHOLHDL No results found for: LDLDIRECT    Radiology: [facility] Chest Port 1 View  Result Date: 11/20/2019 CLINICAL DATA:  Sudden onset of chest pain.  Diaphoresis. EXAM: PORTABLE CHEST 1 VIEW COMPARISON:  May 23, 2019 FINDINGS: The heart size is borderline to mildly enlarged. Pleural thickening versus a left-sided pleural effusion is identified, unchanged. Mild opacity in left base is concerning for developing infiltrate which could be atelectasis or pneumonia. No pneumothorax. The right lung is clear. The hila and mediastinum are unchanged. Stable  pacemaker. IMPRESSION: 1. Small left pleural effusion versus pleural thickening. Mild increased opacity in left base could represent developing pneumonia or atelectasis. Recommend short-term follow-up imaging to ensure resolution. Electronically Signed   By: Dorise Bullion III M.D   On: 11/20/2019 10:29    EKG: Paced rhythm, interventricular conduction delay; rate of 81bpm   ASSESSMENT AND PLAN:  1.  Chest pain, elevated troponin   -Currently chest pain free; Myoview in 2020 negative for reversible ischemia   -Will trend second troponin; if stable, will consider discharge with close outpatient follow up   2.  History of HFrEF  -Appears euvolemic on exam with no evidence of recent  exacerbatation   -Continue home medication regimen of carvedilol 3.125mg  BID, Lasix 40mg  daily, and lisinopril 20mg  daily   3.  History of atrial fibrillation   -Rate well controlled  -Appears to have deferred anticoagulation in the past; will defer re-consideration for anticoagulation to an outpatient setting   4.  History of bradycardia s/p permanent pacemaker insertion   -Continue routine interrogations through [facility]   The history, physical exam findings, and plan of care were all discussed with Dr. Bartholome Bill, and all decision making was made in collaboration.   Signed: Avie Arenas  11/20/2019, 11:52 AM

## 2019-11-20 NOTE — Progress Notes (Signed)
Pt arrived to unit. Pt denies any pain/discomfort. Oriented to room and call bell in reach. Skin intact, skin tag noted to right buttocks.

## 2019-11-20 NOTE — ED Triage Notes (Addendum)
Patient from home via [facility]. Patient reports sudden onset of central chest pain approx 30 min PTA. Patient states he also became diaphoretic and felt like he was going to pass out. Also reports numbness in bilateral arms. Patient given 324 ASA by EMS prior to arrival.

## 2019-11-21 ENCOUNTER — Inpatient Hospital Stay: Payer: Medicare Other

## 2019-11-21 DIAGNOSIS — R7989 Other specified abnormal findings of blood chemistry: Secondary | ICD-10-CM | POA: Diagnosis not present

## 2019-11-21 DIAGNOSIS — R079 Chest pain, unspecified: Secondary | ICD-10-CM | POA: Diagnosis present

## 2019-11-21 DIAGNOSIS — R0789 Other chest pain: Secondary | ICD-10-CM | POA: Diagnosis not present

## 2019-11-21 DIAGNOSIS — R0600 Dyspnea, unspecified: Secondary | ICD-10-CM | POA: Diagnosis not present

## 2019-11-21 LAB — LIPID PANEL
Cholesterol: 136 mg/dL (ref 0–200)
HDL: 32 mg/dL — ABNORMAL LOW (ref >40)
LDL Cholesterol: 87 mg/dL (ref 0–99)
Total CHOL/HDL Ratio: 4.3 RATIO
Triglycerides: 84 mg/dL (ref <150)
VLDL: 17 mg/dL (ref 0–40)

## 2019-11-21 LAB — HEMOGLOBIN A1C
Hgb A1c MFr Bld: 5 % (ref 4.8–5.6)
Mean Plasma Glucose: 96.8 mg/dL

## 2019-11-21 MED ORDER — LISINOPRIL 20 MG PO TABS: ORAL_TABLET | ORAL | 0 refills | Status: AC

## 2019-11-21 MED ORDER — CARVEDILOL 3.125 MG PO TABS: 3.1250 mg | ORAL_TABLET | Freq: Two times a day (BID) | ORAL | 0 refills | Status: DC

## 2019-11-21 MED ORDER — ASPIRIN 81 MG PO TBEC: 81.0000 mg | DELAYED_RELEASE_TABLET | Freq: Every day | ORAL | 0 refills | Status: DC

## 2019-11-21 MED ORDER — ATORVASTATIN CALCIUM 40 MG PO TABS: 40.0000 mg | ORAL_TABLET | Freq: Every day | ORAL | 0 refills | Status: DC

## 2019-11-21 MED ORDER — FUROSEMIDE 40 MG PO TABS: 40.0000 mg | ORAL_TABLET | Freq: Every day | ORAL | 0 refills | Status: DC

## 2019-11-21 MED ORDER — NITROGLYCERIN 0.4 MG SL SUBL: 0.4000 mg | SUBLINGUAL_TABLET | SUBLINGUAL | 0 refills | Status: DC | PRN

## 2019-11-21 NOTE — Progress Notes (Signed)
Discharge instructions explained to pt/ verbalized an understanding/ iv and tele removed/ will transport off unit via wheelchair.  

## 2019-11-21 NOTE — Care Management CC44 (Signed)
Condition Code 44 Documentation Completed  Patient Details  Name: Brent Hoover MRN: [id number] Date of Birth: 19-Dec-1929   Condition Code 44 given:  Yes Patient signature on Condition Code 44 notice:  Yes Documentation of 2 MD's agreement:  Yes Code 44 added to claim:  Yes    Victorino Dike, RN 11/21/2019, 3:18 PM

## 2019-11-21 NOTE — Discharge Instructions (Signed)
Chest Wall Pain Chest wall pain is pain in or around the bones and muscles of your chest. Chest wall pain may be caused by:  An injury.  Coughing a lot.  Using your chest and arm muscles too much. Sometimes, the cause may not be known. This pain may take a few weeks or longer to get better. Follow these instructions at home: Managing pain, stiffness, and swelling If told, put ice on the painful area:  Put ice in a plastic bag.  Place a towel between your skin and the bag.  Leave the ice on for 20 minutes, 2-3 times a day.  Activity  Rest as told by your doctor.  Avoid doing things that cause pain. This includes lifting heavy items.  Ask your doctor what activities are safe for you. General instructions   Take over-the-counter and prescription medicines only as told by your doctor.  Do not use any products that contain nicotine or tobacco, such as cigarettes, e-cigarettes, and chewing tobacco. If you need help quitting, ask your doctor.  Keep all follow-up visits as told by your doctor. This is important. Contact a doctor if:  You have a fever.  Your chest pain gets worse.  You have new symptoms. Get help right away if:  You feel sick to your stomach (nauseous) or you throw up (vomit).  You feel sweaty or light-headed.  You have a cough with mucus from your lungs (sputum) or you cough up blood.  You are short of breath. These symptoms may be an emergency. Do not wait to see if the symptoms will go away. Get medical help right away. Call your local emergency services (911 in the [place] Do not drive yourself to the hospital. Summary  Chest wall pain is pain in or around the bones and muscles of your chest.  It may be treated with ice, rest, and medicines. Your condition may also get better if you avoid doing things that cause pain.  Contact a doctor if you have a fever, chest pain that gets worse, or new symptoms.  Get help right away if you feel light-headed  or you get short of breath. These symptoms may be an emergency. This information is not intended to replace advice given to you by your health care provider. Make sure you discuss any questions you have with your health care provider. Document Revised: 04/21/2018 Document Reviewed: 04/21/2018 Elsevier Patient Education  [place].   Chest Wall Pain Chest wall pain is pain in or around the bones and muscles of your chest. Sometimes, an injury causes this pain. Excessive coughing or overuse of arm and chest muscles may also cause chest wall pain. Sometimes, the cause may not be known. This pain may take several weeks or longer to get better. Follow these instructions at home: Managing pain, stiffness, and swelling   If directed, put ice on the painful area: ? Put ice in a plastic bag. ? Place a towel between your skin and the bag. ? Leave the ice on for 20 minutes, 2-3 times per day. Activity  Rest as told by your health care provider.  Avoid activities that cause pain. These include any activities that use your chest muscles or your abdominal and side muscles to lift heavy items. Ask your health care provider what activities are safe for you. General instructions   Take over-the-counter and prescription medicines only as told by your health care provider.  Do not use any products that contain nicotine or tobacco,  such as cigarettes, e-cigarettes, and chewing tobacco. These can delay healing after injury. If you need help quitting, ask your health care provider.  Keep all follow-up visits as told by your health care provider. This is important. Contact a health care provider if:  You have a fever.  Your chest pain becomes worse.  You have new symptoms. Get help right away if:  You have nausea or vomiting.  You feel sweaty or light-headed.  You have a cough with mucus from your lungs (sputum) or you cough up blood.  You develop shortness of breath. These symptoms  may represent a serious problem that is an emergency. Do not wait to see if the symptoms will go away. Get medical help right away. Call your local emergency services (911 in the [place] Do not drive yourself to the hospital. Summary  Chest wall pain is pain in or around the bones and muscles of your chest.  Depending on the cause, it may be treated with ice, rest, medicines, and avoiding activities that cause pain.  Contact a health care provider if you have a fever, worsening chest pain, or new symptoms.  Get help right away if you feel light-headed or you develop shortness of breath. These symptoms may be an emergency. This information is not intended to replace advice given to you by your health care provider. Make sure you discuss any questions you have with your health care provider. Document Revised: 04/21/2018 Document Reviewed: 04/21/2018 Elsevier Patient Education  2020 [employer].

## 2019-11-21 NOTE — Care Management Obs Status (Signed)
[facility] NOTIFICATION   Patient Details  Name: Brent Hoover MRN: [id number] Date of Birth: 03-03-1930   Medicare Observation Status Notification Given:  Yes    Victorino Dike, RN 11/21/2019, 3:18 PM

## 2019-11-21 NOTE — Progress Notes (Signed)
[facility] Cardiology    SUBJECTIVE: Brent Hoover is an 86 year old male with a past medical history significant for chronic HFrEF, history of bradycardia and atrial fibrillation s/p permanent pacemaker implantation at [facility], mild AS, hyperlipidemia, hypertension, and history of CLL with recurrent pleural effusions who presented to the ED on 11/20/19 for an acute onset of chest "emptiness" with radiation to bilateral arms, diaphoresis, as well as near syncope.  The episode lasted approximately 15 minutes and resolved without intervention.  Workup in the ED included ECG revealing intraventricular conduction delay, no change from previous ECG, high sensitivity troponin elevated x 4, at 34, 41, 59, and 86 respectively, and chest xray revealing a small left pleural effusion with evidence of increased opacity in the left base - no evidence of vascular congestion.  D dimer was elevated at 1549 but chest CT was negative for evidence of a PE.   Today, Brent Hoover continues to deny any recurrent chest pain.  He reports "feeling great" and is ready to go home.  Denies shortness of breath, lower extremity swelling, orthopnea, or PND.   He is followed in outpatient cardiology by Dr. Nehemiah Massed.  Stress test on 06/14/19 was negative for reversible ischemia. Most recent echocardiogram on 06/13/19 revealed mild to moderate reduction in LV systolic function with an EF estimated between 40-45% with moderate LVH, mild MR, PR and TR. Left atrium was severely enlarged.    Vitals:   11/20/19 1500 11/20/19 1544 11/20/19 1952 11/21/19 0526  BP: 130/78 (!) 153/86 124/66 (!) 102/59  Pulse: 61 73 60 60  Resp:  20 20 20   Temp:  (!) 97.5 F (36.4 C) 97.8 F (36.6 C) 97.8 F (36.6 C)  TempSrc:  Oral Oral Oral  SpO2: 98% 99% 98% 96%  Weight:      Height:        No intake or output data in the 24 hours ending 11/21/19 0812    PHYSICAL EXAM  General: Well developed, well nourished, in no acute distress HEENT:  Normocephalic and  atramatic Neck:  No JVD.  Lungs: Clear bilaterally to auscultation and percussion. Heart: HRRR . Normal S1 and S2 without gallops or murmurs.  Abdomen: Bowel sounds are positive, abdomen soft and non-tender  Msk:  Back normal, normal gait. Normal strength and tone for age. Extremities: No clubbing, cyanosis or edema.   Neuro: Alert and oriented X 3. Psych:  Good affect, responds appropriately   LABS: Basic Metabolic Panel: Recent Labs    11/20/19 1018  NA 136  K 4.0  CL 100  CO2 28  GLUCOSE 109*  BUN 13  CREATININE 1.33*  CALCIUM 8.4*   Liver Function Tests: No results for input(s): AST, ALT, ALKPHOS, BILITOT, PROT, ALBUMIN in the last 72 hours. No results for input(s): LIPASE, AMYLASE in the last 72 hours. CBC: Recent Labs    11/20/19 1018  WBC 9.9  HGB 14.3  HCT 43.2  MCV 87.4  PLT 189   Cardiac Enzymes: No results for input(s): CKTOTAL, CKMB, CKMBINDEX, TROPONINI in the last 72 hours. BNP: Invalid input(s): POCBNP D-Dimer: No results for input(s): DDIMER in the last 72 hours. Hemoglobin A1C: No results for input(s): HGBA1C in the last 72 hours. Fasting Lipid Panel: Recent Labs    11/21/19 0513  CHOL 136  HDL 32*  LDLCALC 87  TRIG 84  CHOLHDL 4.3   Thyroid Function Tests: No results for input(s): TSH, T4TOTAL, T3FREE, THYROIDAB in the last 72 hours.  Invalid input(s): FREET3 Anemia  Panel: No results for input(s): VITAMINB12, FOLATE, FERRITIN, TIBC, IRON, RETICCTPCT in the last 72 hours.  CT ANGIO CHEST PE W OR WO CONTRAST  Result Date: 11/20/2019 CLINICAL DATA:  Sudden onset chest pain. History of chronic lymphocytic leukemia. EXAM: CT ANGIOGRAPHY CHEST WITH CONTRAST TECHNIQUE: Multidetector CT imaging of the chest was performed using the standard protocol during bolus administration of intravenous contrast. Multiplanar CT image reconstructions and MIPs were obtained to evaluate the vascular anatomy. CONTRAST:  101mL OMNIPAQUE IOHEXOL 350 MG/ML SOLN  COMPARISON:  04/03/2019 FINDINGS: Cardiovascular: No filling defect is identified in the pulmonary arterial tree to suggest pulmonary embolus. Coronary, aortic arch, and branch vessel atherosclerotic vascular disease. Mild cardiomegaly. Ascending thoracic aortic aneurysm 4.9 cm in diameter, previously 4.8 cm. Small pericardial effusion. Mediastinum/Nodes: Small mediastinal lymph nodes are not pathologically enlarged. Lungs/Pleura: Linear subpleural nodularity in the right upper lobe 0.5 by 0.3 cm on image 36/6, stable. Bilateral airway thickening. Small left pleural effusion with associated pleural thickening and passive atelectasis in the left lower lobe. Upper Abdomen: We partially include cystic lesions associated with the right kidney. Musculoskeletal: Thoracic spondylosis. Review of the MIP images confirms the above findings. IMPRESSION: 1. No filling defect is identified in the pulmonary arterial tree to suggest pulmonary embolus. 2. Ascending thoracic aortic aneurysm 4.9 cm in diameter, previously 4.8 cm. Recommend semi-annual imaging followup by CTA or MRA and referral to cardiothoracic surgery if not already obtained. This recommendation follows 2010 ACCF/AHA/AATS/ACR/ASA/SCA/SCAI/SIR/STS/SVM Guidelines for the Diagnosis and Management of Patients With Thoracic Aortic Disease. Circulation. 2010; [id number]. Aortic aneurysm NOS (ICD10-I71.9) 3. Coronary, aortic arch, and branch vessel atherosclerotic vascular disease. Mild cardiomegaly. Small pericardial effusion. Aortic Atherosclerosis (ICD10-I70.0). 4. Small left pleural effusion with associated pleural thickening and passive atelectasis in the left lower lobe. 5. Airway thickening is present, suggesting bronchitis or reactive airways disease. 6. Stable 3 by 5 mm right upper lobe nodule, not changed from 2012, considered benign. Electronically Signed   By: Van Clines M.D.   On: 11/20/2019 19:15   DG Chest Port 1 View  Result Date:  11/20/2019 CLINICAL DATA:  Sudden onset of chest pain.  Diaphoresis. EXAM: PORTABLE CHEST 1 VIEW COMPARISON:  May 23, 2019 FINDINGS: The heart size is borderline to mildly enlarged. Pleural thickening versus a left-sided pleural effusion is identified, unchanged. Mild opacity in left base is concerning for developing infiltrate which could be atelectasis or pneumonia. No pneumothorax. The right lung is clear. The hila and mediastinum are unchanged. Stable pacemaker. IMPRESSION: 1. Small left pleural effusion versus pleural thickening. Mild increased opacity in left base could represent developing pneumonia or atelectasis. Recommend short-term follow-up imaging to ensure resolution. Electronically Signed   By: Dorise Bullion III M.D   On: 11/20/2019 10:29    ASSESSMENT AND PLAN:  Principal Problem:   Chest discomfort Active Problems:   CLL (chronic lymphocytic leukemia) (HCC)   HLD (hyperlipidemia)   HTN (hypertension)   CKD (chronic kidney disease), stage III   Elevated troponin   Chronic atrial fibrillation (HCC)   Chronic systolic CHF (congestive heart failure) ([place])    1.  Chest discomfort, elevated troponin   -No recurrent symptoms, troponin peaked at 86  -Chest CT negative for PE, lower extremity doppler negative for DVT   -Myoview in 2020 negative for reversible ischemia   -No further workup indicated in an outpatient setting; f/u appointment with Dr. Nehemiah Massed scheduled   2.  HFrEF             -  Appears euvolemic on exam with no evidence of recent exacerbatation              -Continue home medication regimen of carvedilol 3.125mg  BID, Lasix 40mg  daily, and lisinopril 20mg  daily   3.  History of atrial fibrillation              -Rate well controlled on carvedilol              -Appears to have deferred anticoagulation in the past; will defer re-consideration for anticoagulation to an outpatient setting   4.  History of bradycardia s/p permanent pacemaker insertion              -Continue routine interrogations through [facility]   The history, physical exam findings, and plan of care were all discussed with Dr. Bartholome Bill, and all decision making was made in collaboration.   Avie Arenas  PA-C 11/21/2019 8:12 AM

## 2019-11-22 NOTE — Discharge Summary (Signed)
Physician Discharge Summary  Patient ID: ZAMAN RINGOLD MRN: [id number] DOB/AGE: 12/27/1929 86 y.o.  Admit date: 11/20/2019 Discharge date: 11/22/2019  Admission Diagnoses:  Discharge Diagnoses:  Principal Problem:   Chest discomfort Active Problems:   CLL (chronic lymphocytic leukemia) (HCC)   HLD (hyperlipidemia)   HTN (hypertension)   CKD (chronic kidney disease), stage III   Elevated troponin   Chronic atrial fibrillation (HCC)   Chronic systolic CHF (congestive heart failure) (HCC)   Chest pain   Discharged Condition: fair  Hospital Course:  Mr. Nygaard is an 86 year old male with a past medical history significant for chronic HFrEF, history of bradycardia and atrial fibrillation s/p permanent pacemaker implantation at [facility], mild AS, hyperlipidemia, hypertension, and history of CLL with recurrent pleural effusions who presented to the ED on 11/20/19 for an acute onset of chest "emptiness" with radiation to bilateral arms, diaphoresis, as well as near syncope.  The episode lasted approximately 15 minutes and resolved without intervention. He is currently chest pain free and also denies shortness of breath, palpitations, lower extremity swelling, orthopnea, PND, dizziness, or lightheadedness.  Workup in the ED thus far has been significant for ECG revealing no change from previous ECG, high sensitivity troponin elevated x 1, 34, and chest xray revealing a small left pleural effusion with evidence of increased opacity in the left base - no evidence of vascular congestion.   Chest discomfort and elevated trop: Chest pain resolved  - Cardiology consulted  - No recurrent symptoms, troponin peaked at 86             -Chest CT negative for PE, lower extremity doppler negative for DVT              -Workup in 2020 negative for reversible ischemia              -No further workup indicated in inpatient setting; f/u appointment with Dr. Nehemiah Massed scheduled   CLL (chronic lymphocytic leukemia)  ([place]): -f/u with oncology  HLD (hyperlipidemia): Not taking medications at home -Lipid Penl shows low HDL  - Improved diet and lifestyle modification  - F/U PCP and Cardiology   HTN:  -Continue home medications: Coreg, lisinopril -Patient is also on Lasix  CKD (chronic kidney disease), stage IIIa: Close to baseline.  Recent creatinine 1.25 on 03/25/2019. - Avoid nephrotoxic agents  - Control BP  - F/U PCP  Chronic atrial fibrillation ([place]): Rate controlled  - pt deferred anticoagulants -continue coreg - F/U PCP and Cardiology   Chronic systolic CHF (congestive heart failure) ([place]): Euvolemic  - 2D echo on 04/01/2019 showed EF of 40-45%.  No leg edema or JVD. -Continue home Lasix - F/U Cardiology   Consults: cardiology  Significant Diagnostic Studies: Radiology and Blood Work   Treatments: Per hospital course and d/c medlist   Discharge Exam: Blood pressure (!) 142/85, pulse 68, temperature 97.6 F (36.4 C), temperature source Oral, resp. rate 19, height 5\' 7"  (1.702 m), weight 106.6 kg, SpO2 98 %.  General: Not in acute distress HEENT:       Eyes: PERRL, EOMI, no scleral icterus.       ENT: No discharge from the ears and nose, no pharynx injection, no tonsillar enlargement.        Neck: No JVD, no bruit, no mass felt. Heme: No neck lymph node enlargement. Cardiac: S1/S2, RRR, No murmurs, No gallops or rubs. Respiratory: No rales, wheezing, rhonchi or rubs. GI: Soft, nondistended, nontender, no rebound pain, no organomegaly, BS  present. Ext: No pitting leg edema bilaterally. 2+DP/PT pulse bilaterally. Musculoskeletal: No joint deformities, No joint redness or warmth, no limitation of ROM in spin. Skin: No rashes.  Neuro: Alert, oriented X3, cranial nerves II-XII grossly intact, moves all extremities normally.  Psych: Patient is not psychotic, no suicidal or hemocidal ideation.  Disposition: Discharge disposition: 01-Home or Self Care      Stable to D/C home  with close f/u Cardiology and PCP  Discharge Instructions    Call MD for:  redness, tenderness, or signs of infection (pain, swelling, redness, odor or green/yellow discharge around incision site)   Complete by: As directed    Also call MD with chest pain, palpitation, dizziness or focal neurological deficits   Diet - low sodium heart healthy   Complete by: As directed    Increase activity slowly   Complete by: As directed      Allergies as of 11/21/2019      Reactions   Penicillins Other (See Comments)   Has patient had a PCN reaction causing immediate rash, facial/tongue/throat swelling, SOB or lightheadedness with hypotension: Unknown Has patient had a PCN reaction causing severe rash involving mucus membranes or skin necrosis: Unknown Has patient had a PCN reaction that required hospitalization: Unknown Has patient had a PCN reaction occurring within the last 10 years: Unknown If all of the above answers are "NO", then may proceed with Cephalosporin use.      Medication List    TAKE these medications   aspirin 81 MG EC tablet Take 1 tablet (81 mg total) by mouth daily.   atorvastatin 40 MG tablet Commonly known as: LIPITOR Take 1 tablet (40 mg total) by mouth daily at 6 PM.   carvedilol 3.125 MG tablet Commonly known as: COREG Take 1 tablet (3.125 mg total) by mouth 2 (two) times daily. kowalski   furosemide 40 MG tablet Commonly known as: LASIX Take 1 tablet (40 mg total) by mouth daily. kowalski   lisinopril 20 MG tablet Commonly known as: ZESTRIL TAKE (1) TABLET BY MOUTH DAILY.   nitroGLYCERIN 0.4 MG SL tablet Commonly known as: NITROSTAT Place 1 tablet (0.4 mg total) under the tongue every 5 (five) minutes as needed for chest pain.   tamsulosin 0.4 MG Caps capsule Commonly known as: FLOMAX Take 1 capsule (0.4 mg total) by mouth daily. Dr Rogers Blocker      Follow-up Information    Corey Skains, MD Follow up in 1 week(s).   Specialty: Cardiology Contact  information: [address] [place] [place] [address] [phone]        Corey Skains, MD. Go on 11/30/2019.   Specialty: Cardiology Why: f/u......Marland Kitchenappointment at 10:30am Contact information: [address] [facility] [place] [address] [phone]           Signed: Thornell Mule 11/22/2019, 9:37 AM

## 2019-12-05 DIAGNOSIS — I4891 Unspecified atrial fibrillation: Secondary | ICD-10-CM | POA: Diagnosis not present

## 2019-12-05 DIAGNOSIS — I712 Thoracic aortic aneurysm, without rupture: Secondary | ICD-10-CM | POA: Diagnosis not present

## 2019-12-05 DIAGNOSIS — I5022 Chronic systolic (congestive) heart failure: Secondary | ICD-10-CM | POA: Diagnosis not present

## 2019-12-05 DIAGNOSIS — I10 Essential (primary) hypertension: Secondary | ICD-10-CM | POA: Diagnosis not present

## 2019-12-06 DIAGNOSIS — I7121 Aneurysm of the ascending aorta, without rupture: Secondary | ICD-10-CM | POA: Insufficient documentation

## 2019-12-21 IMAGING — US US THORACENTESIS ASP PLEURAL SPACE W/IMG GUIDE
1 series · 4 of 4 positions shown · non-contrast
Comparison: 04/03/2019

CLINICAL DATA: Recurrent left pleural effusion and dyspnea with
exertion.

EXAM:
ULTRASOUND GUIDED LEFT THORACENTESIS

[Series 1: us thoracentesis asp pleural space w/img guide · 4 of 4 slices shown]
[im 1/4]
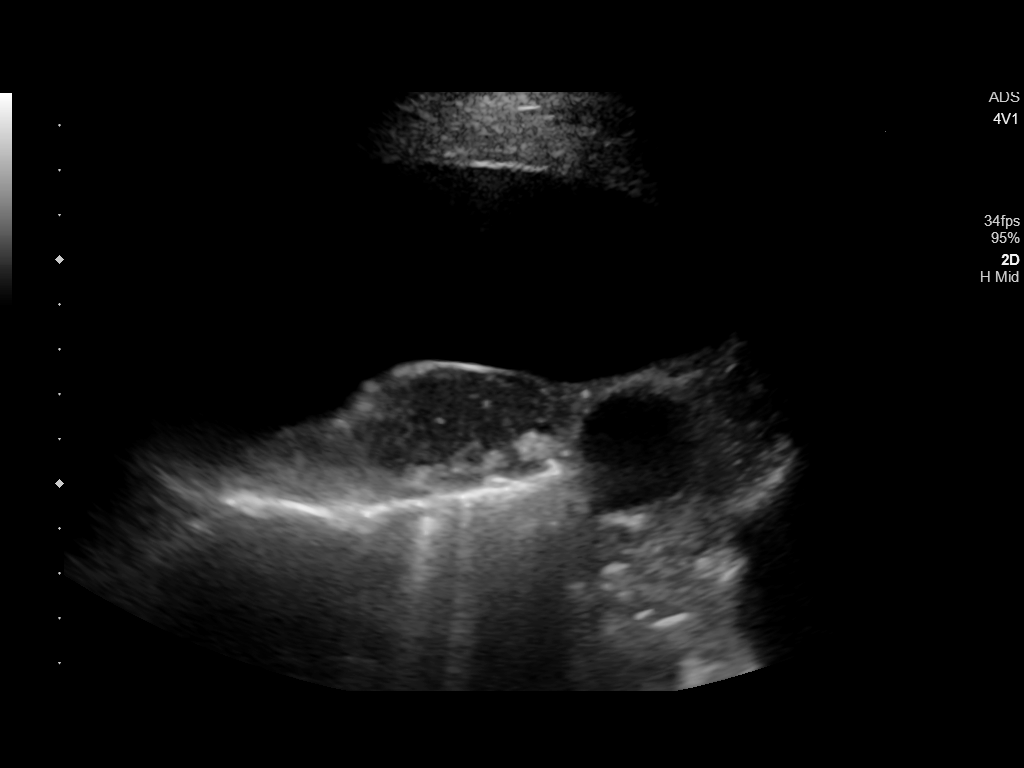
[im 2/4]
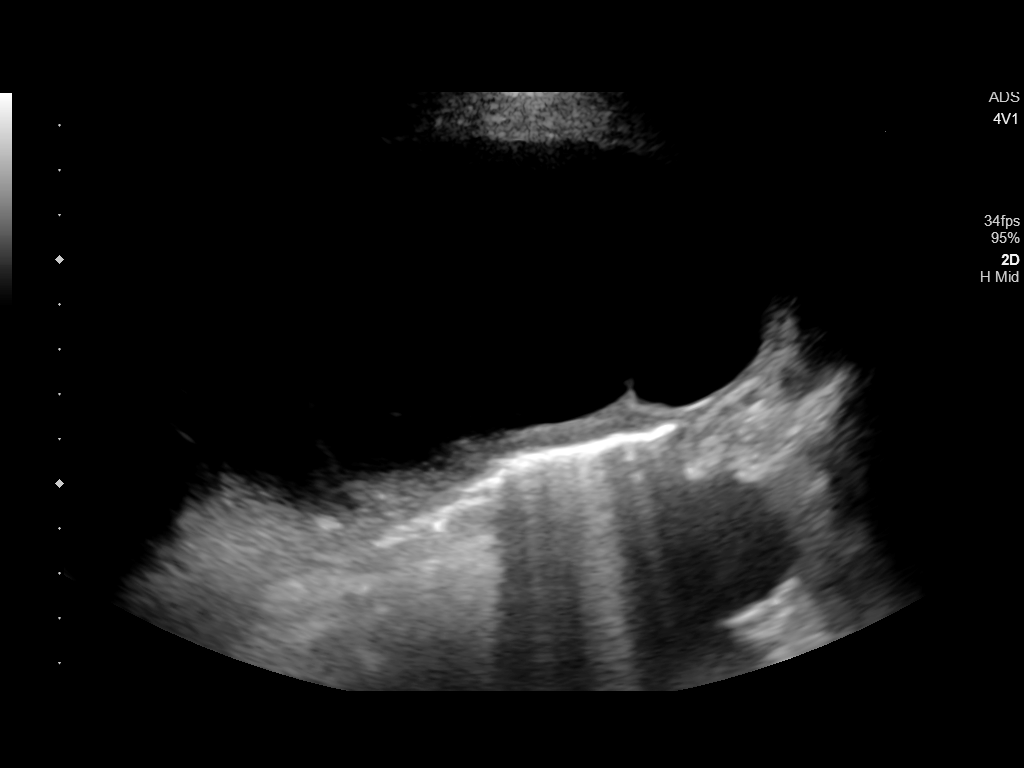
[im 3/4]
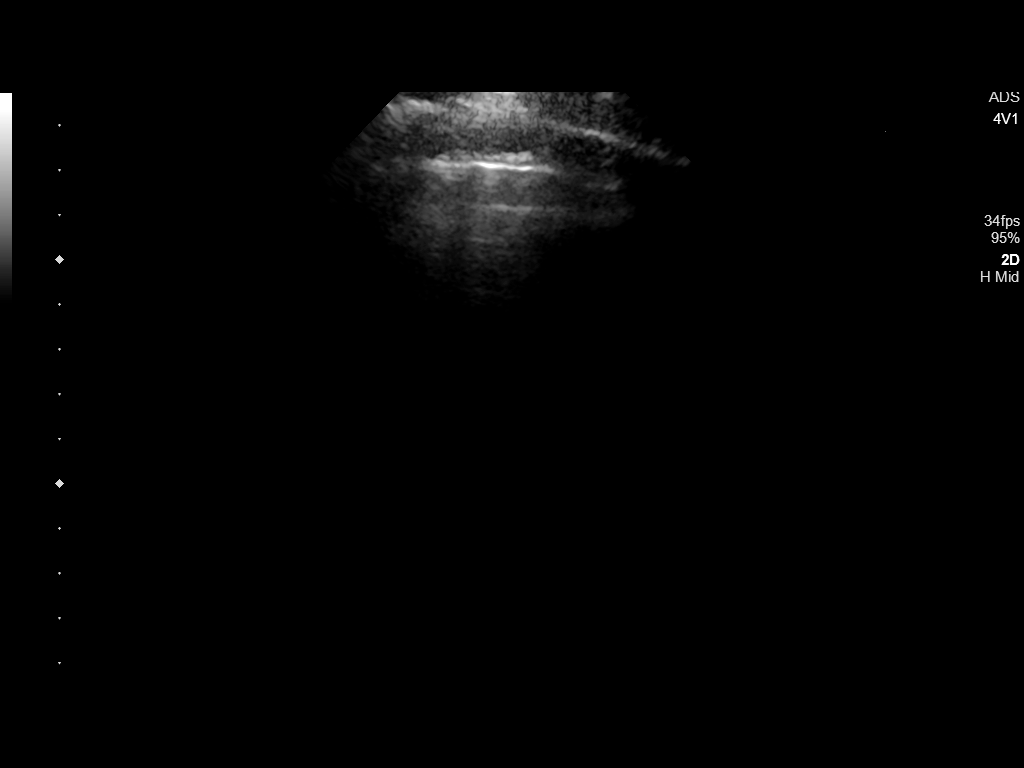
[im 4/4]
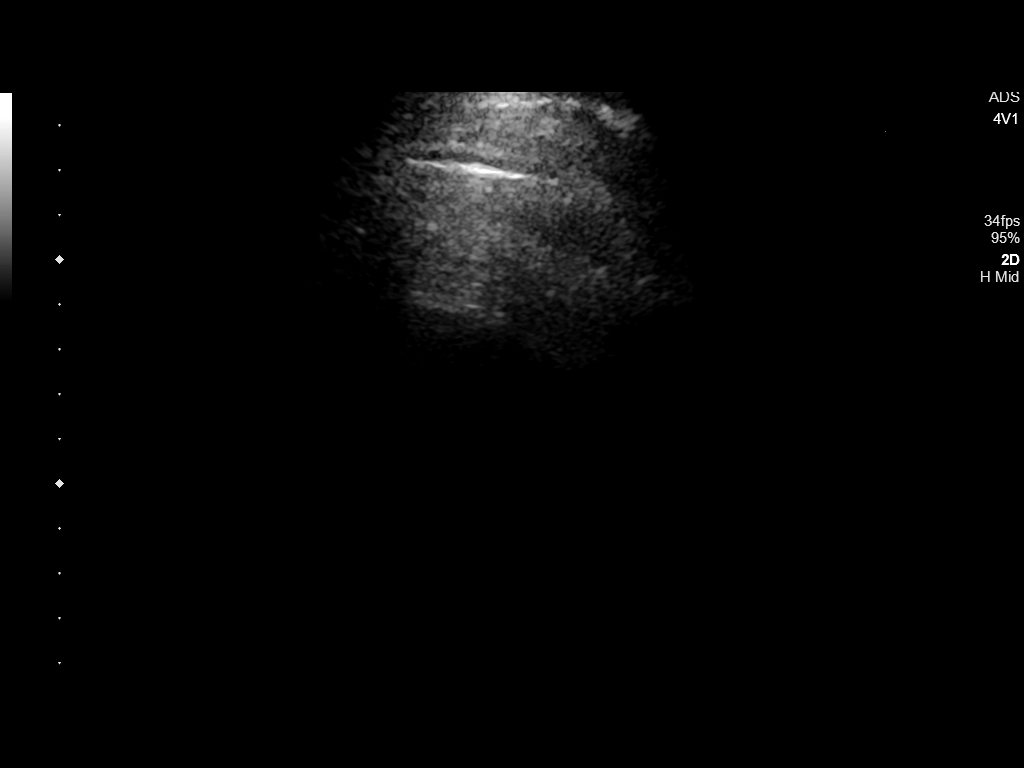

[4 of 4 positions shown; findings below may reference images not displayed]

PROCEDURE:
An ultrasound guided thoracentesis was thoroughly discussed with the
patient and questions answered. The benefits, risks, alternatives
and complications were also discussed. The patient understands and
wishes to proceed with the procedure. Written consent was obtained.

Ultrasound was performed to localize and mark an adequate pocket of
fluid in the left chest. The area was then prepped and draped in the
normal sterile fashion. 1% Lidocaine was used for local anesthesia.
Under ultrasound guidance a 6 French Safe-T-Centesis catheter was
introduced. Thoracentesis was performed. The catheter was removed
and a dressing applied.

COMPLICATIONS:
None
FINDINGS: A total of approximately 800 mL of amber colored fluid was removed.
IMPRESSION: Successful ultrasound guided left thoracentesis yielding 800 mL of
pleural fluid.

## 2019-12-21 IMAGING — CR CHEST  1 VIEW
1 series · 1 of 1 positions shown · non-contrast
Comparison: 04/17/2019

CLINICAL DATA: Status post left thoracentesis for recurrent pleural
effusion.

EXAM:
CHEST  1 VIEW

[dg chest 1 view]
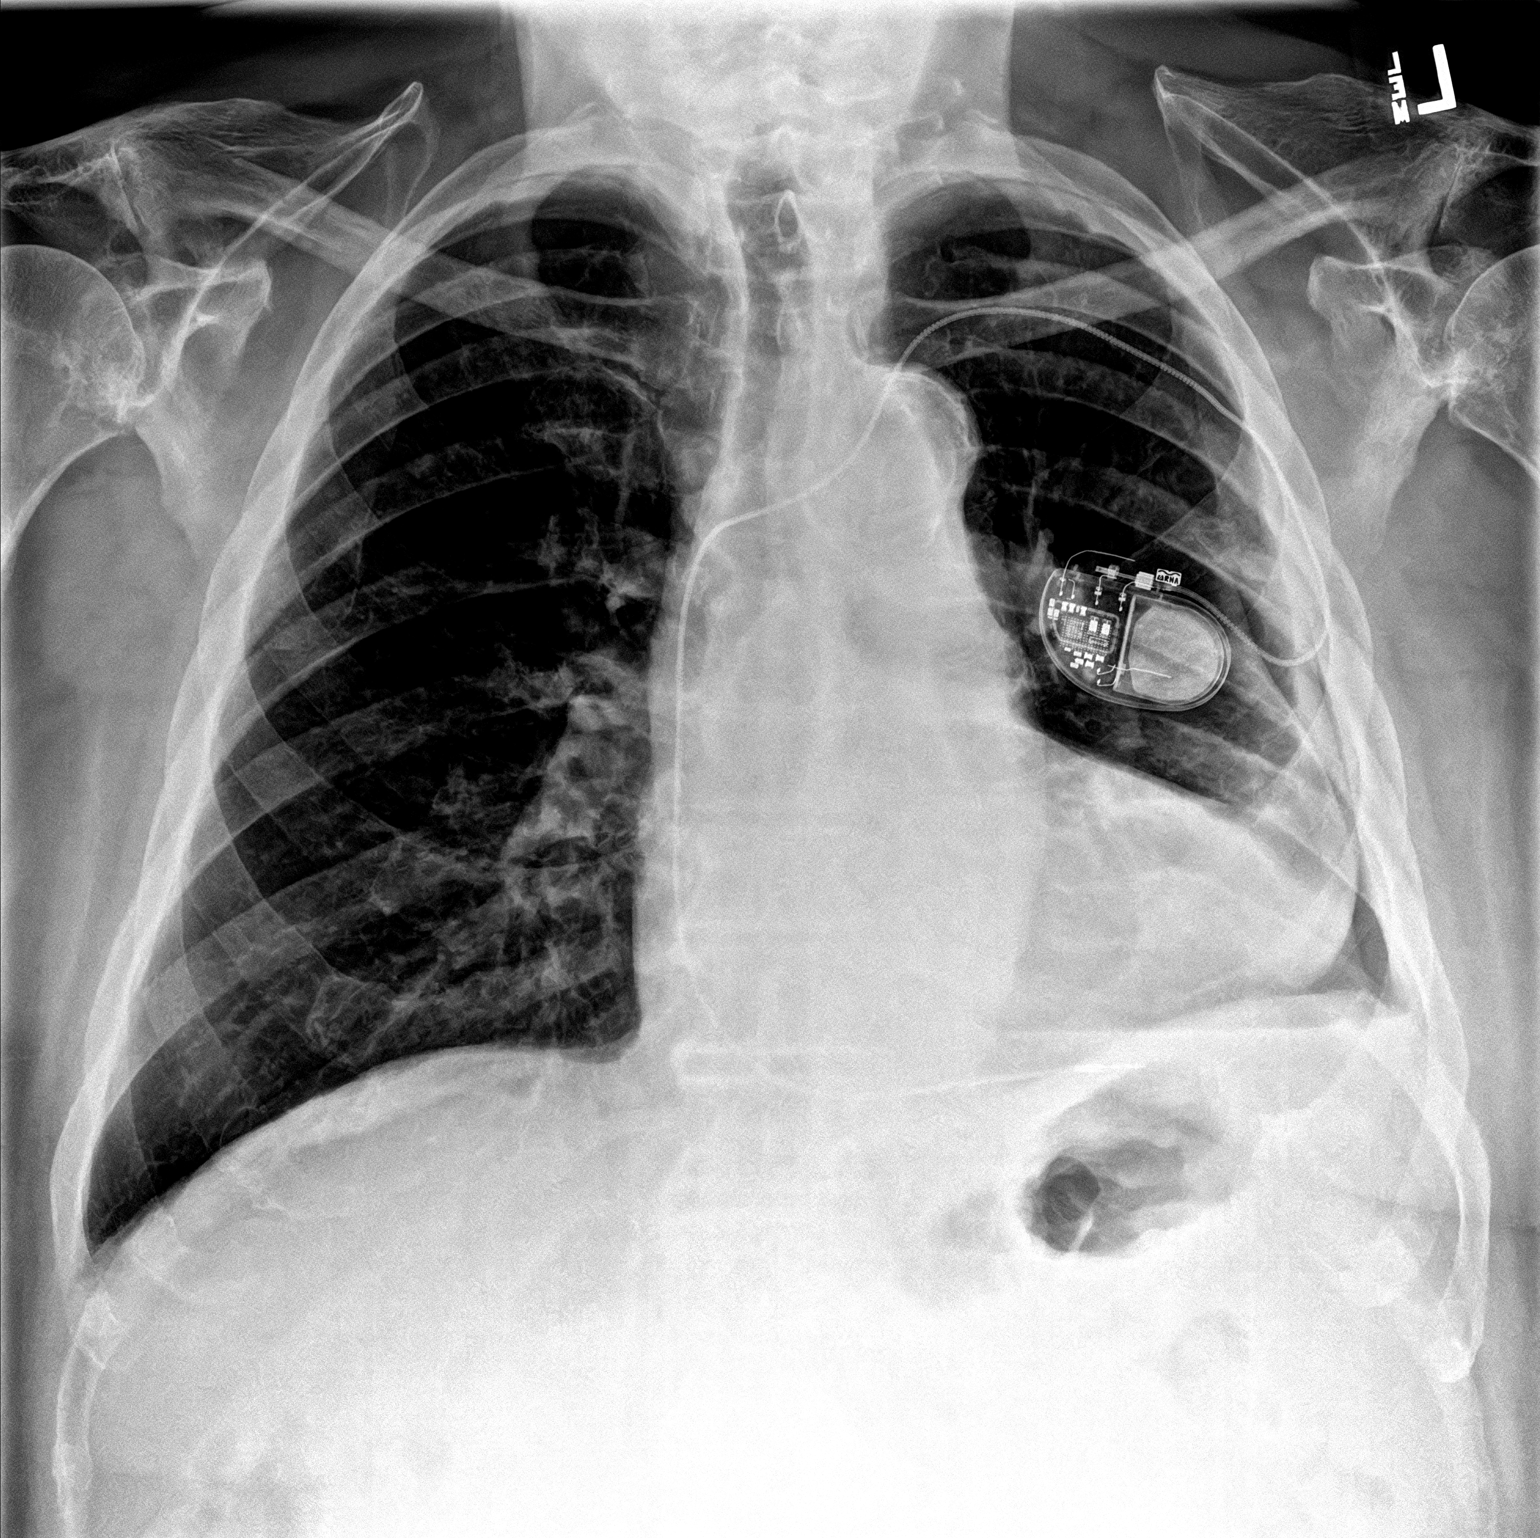

[1 of 1 positions shown; findings below may reference images not displayed]

FINDINGS: Minimal residual left pleural fluid remains after thoracentesis. The
left lung again demonstrates lack of complete re-expansion with a
small ex vacuo component air in the lateral and basilar pleural
space. Stable appearance a single-chamber pacemaker. No pulmonary
edema. The heart size is normal.
IMPRESSION: Minimal residual left pleural fluid after thoracentesis. The left
lung again demonstrates lack of complete re-expansion with a small
ex vacuo component of air in the pleural space.

## 2020-01-02 DIAGNOSIS — I4891 Unspecified atrial fibrillation: Secondary | ICD-10-CM | POA: Diagnosis not present

## 2020-01-02 DIAGNOSIS — I712 Thoracic aortic aneurysm, without rupture: Secondary | ICD-10-CM | POA: Diagnosis not present

## 2020-01-02 DIAGNOSIS — I10 Essential (primary) hypertension: Secondary | ICD-10-CM | POA: Diagnosis not present

## 2020-01-02 DIAGNOSIS — I5022 Chronic systolic (congestive) heart failure: Secondary | ICD-10-CM | POA: Diagnosis not present

## 2020-01-04 ENCOUNTER — Ambulatory Visit: Payer: Medicare Other | Attending: Internal Medicine

## 2020-01-04 DIAGNOSIS — Z23 Encounter for immunization: Secondary | ICD-10-CM | POA: Insufficient documentation

## 2020-01-04 NOTE — Progress Notes (Signed)
   Covid-19 Vaccination Clinic  Name:  ZETH SODERGREN    MRN: [id number] DOB: Mar 12, 1930  01/04/2020  Mr. Lennartz was observed post Covid-19 immunization for 15 minutes without incident. He was provided with Vaccine Information Sheet and instruction to access the V-Safe system.   Mr. Mcgaffigan was instructed to call 911 with any severe reactions post vaccine: Marland Kitchen Difficulty breathing  . Swelling of face and throat  . A fast heartbeat  . A bad rash all over body  . Dizziness and weakness   Immunizations Administered    Name Date Dose VIS Date Route   Pfizer COVID-19 Vaccine 01/04/2020  9:03 AM 0.3 mL 10/13/2019 Intramuscular   Manufacturer: [place]   Lot: [id number]   [place]: [id number]

## 2020-01-16 ENCOUNTER — Encounter: Payer: Self-pay | Admitting: Family Medicine

## 2020-01-16 ENCOUNTER — Ambulatory Visit (INDEPENDENT_AMBULATORY_CARE_PROVIDER_SITE_OTHER): Payer: Medicare Other | Admitting: Family Medicine

## 2020-01-16 ENCOUNTER — Other Ambulatory Visit: Payer: Self-pay

## 2020-01-16 VITALS — BP 130/80 | HR 72 | Ht 67.0 in | Wt 240.0 lb

## 2020-01-16 DIAGNOSIS — I700 Atherosclerosis of aorta: Secondary | ICD-10-CM

## 2020-01-16 DIAGNOSIS — E785 Hyperlipidemia, unspecified: Secondary | ICD-10-CM | POA: Diagnosis not present

## 2020-01-16 MED ORDER — EZETIMIBE 10 MG PO TABS: 10.0000 mg | ORAL_TABLET | Freq: Every day | ORAL | 5 refills | Status: DC

## 2020-01-16 NOTE — Progress Notes (Signed)
Date:  01/16/2020   Name:  Brent Hoover   DOB:  Sep 10, 1930   MRN:  [id number]   Chief Complaint: Hyperlipidemia (discuss something in the place of atorvastatin- read over Kowalski's note and nothing was said )  Hyperlipidemia This is a chronic problem. The current episode started more than 1 year ago. The problem is controlled. Recent lipid tests were reviewed and are normal. He has no history of chronic renal disease, diabetes, hypothyroidism, liver disease, obesity or nephrotic syndrome. There are no known factors aggravating his hyperlipidemia. Associated symptoms include shortness of breath. Pertinent negatives include no chest pain, focal sensory loss, focal weakness, leg pain or myalgias. He is currently on no antihyperlipidemic treatment (unable to tolerate atorvastatin). The current treatment provides no improvement of lipids.    Lab Results  Component Value Date   CREATININE 1.33 (H) 11/20/2019   BUN 13 11/20/2019   NA 136 11/20/2019   K 4.0 11/20/2019   CL 100 11/20/2019   CO2 28 11/20/2019   Lab Results  Component Value Date   CHOL 136 11/21/2019   HDL 32 (L) 11/21/2019   LDLCALC 87 11/21/2019   TRIG 84 11/21/2019   CHOLHDL 4.3 11/21/2019   No results found for: TSH Lab Results  Component Value Date   HGBA1C 5.0 11/21/2019   Lab Results  Component Value Date   WBC 9.9 11/20/2019   HGB 14.3 11/20/2019   HCT 43.2 11/20/2019   MCV 87.4 11/20/2019   PLT 189 11/20/2019   Lab Results  Component Value Date   ALT 14 10/25/2019   AST 18 10/25/2019   ALKPHOS 45 10/25/2019   BILITOT 0.7 10/25/2019     Review of Systems  Constitutional: Negative for chills and fever.  HENT: Negative for drooling, ear discharge, ear pain and sore throat.   Respiratory: Positive for shortness of breath. Negative for cough and wheezing.   Cardiovascular: Negative for chest pain, palpitations and leg swelling.  Gastrointestinal: Negative for abdominal pain, blood in stool,  constipation, diarrhea and nausea.  Endocrine: Negative for polydipsia.  Genitourinary: Negative for dysuria, frequency, hematuria and urgency.  Musculoskeletal: Negative for back pain, myalgias and neck pain.  Skin: Negative for rash.  Allergic/Immunologic: Negative for environmental allergies.  Neurological: Negative for dizziness, focal weakness and headaches.  Hematological: Does not bruise/bleed easily.  Psychiatric/Behavioral: Negative for suicidal ideas. The patient is not nervous/anxious.     Patient Active Problem List   Diagnosis Date Noted  . Chest pain 11/21/2019  . Chest discomfort 11/20/2019  . Chronic systolic CHF (congestive heart failure) ([place]) 11/20/2019  . Atherosclerosis of aorta ([place]) 04/06/2019  . Chronic atrial fibrillation ([place]) 04/06/2019  . Thrombocytopenia, unspecified ([place]) 04/06/2019  . Shortness of breath   . HLD (hyperlipidemia) 03/31/2019  . HTN (hypertension) 03/31/2019  . Pleural effusion 03/31/2019  . CKD (chronic kidney disease), stage III 03/31/2019  . Elevated troponin 03/31/2019  . Hyponatremia 05/20/2018  . Splenomegaly 07/27/2017  . CLL (chronic lymphocytic leukemia) ([place]) 04/30/2015    Allergies  Allergen Reactions  . Penicillins Other (See Comments)    Has patient had a PCN reaction causing immediate rash, facial/tongue/throat swelling, SOB or lightheadedness with hypotension: Unknown Has patient had a PCN reaction causing severe rash involving mucus membranes or skin necrosis: Unknown Has patient had a PCN reaction that required hospitalization: Unknown Has patient had a PCN reaction occurring within the last 10 years: Unknown If all of the above answers are "NO", then may proceed  with Cephalosporin use.    Past Surgical History:  Procedure Laterality Date  . COLONOSCOPY WITH PROPOFOL N/A 09/10/2015   Procedure: COLONOSCOPY WITH PROPOFOL;  Surgeon: Lucilla Lame, MD;  Location: [facility] ENDOSCOPY;  Service: Endoscopy;  Laterality: N/A;    . PROSTATE SURGERY  2008    Social History   Tobacco Use  . Smoking status: Never Smoker  . Smokeless tobacco: Never Used  Substance Use Topics  . Alcohol use: No    Alcohol/week: 0.0 standard drinks  . Drug use: No     Medication list has been reviewed and updated.  Current Meds  Medication Sig  . aspirin EC 81 MG EC tablet Take 1 tablet (81 mg total) by mouth daily.  . carvedilol (COREG) 6.25 MG tablet Take 6.25 mg by mouth 2 (two) times daily with a meal. kowalski  . furosemide (LASIX) 40 MG tablet Take 1 tablet (40 mg total) by mouth daily. kowalski  . lisinopril (ZESTRIL) 20 MG tablet TAKE (1) TABLET BY MOUTH DAILY.  . nitroGLYCERIN (NITROSTAT) 0.4 MG SL tablet Place 1 tablet (0.4 mg total) under the tongue every 5 (five) minutes as needed for chest pain.  . tamsulosin (FLOMAX) 0.4 MG CAPS capsule Take 1 capsule (0.4 mg total) by mouth daily. Dr Rogers Blocker    [facility] 2/9 Scores 01/16/2020 05/19/2019 04/06/2019 05/24/2018  PHQ - 2 Score 0 0 0 0  PHQ- 9 Score 0 0 0 -    BP Readings from Last 3 Encounters:  01/16/20 130/80  11/21/19 (!) 142/85  07/20/19 (!) 142/82    Physical Exam Vitals and nursing note reviewed.  Constitutional:      Appearance: He is normal weight.  HENT:     Head: Normocephalic.     Right Ear: Tympanic membrane, ear canal and external ear normal. There is no impacted cerumen.     Left Ear: Tympanic membrane, ear canal and external ear normal. There is no impacted cerumen.     Nose: Nose normal. No congestion or rhinorrhea.     Mouth/Throat:     Mouth: Mucous membranes are moist.  Eyes:     General: No scleral icterus.       Right eye: No discharge.        Left eye: No discharge.     Conjunctiva/sclera: Conjunctivae normal.     Pupils: Pupils are equal, round, and reactive to light.  Neck:     Thyroid: No thyromegaly.     Vascular: No JVD.     Trachea: No tracheal deviation.  Cardiovascular:     Rate and Rhythm: Normal rate and regular rhythm.      Heart sounds: Normal heart sounds. No murmur. No friction rub. No gallop.   Pulmonary:     Effort: No respiratory distress.     Breath sounds: Normal breath sounds. No wheezing or rales.  Abdominal:     General: Bowel sounds are normal.     Palpations: Abdomen is soft. There is no mass.     Tenderness: There is no abdominal tenderness. There is no guarding or rebound.  Musculoskeletal:        General: No tenderness. Normal range of motion.     Cervical back: Normal range of motion and neck supple.  Lymphadenopathy:     Cervical: No cervical adenopathy.  Skin:    General: Skin is warm.     Findings: No rash.  Neurological:     Mental Status: He is alert and oriented to person, place,  and time.     Cranial Nerves: No cranial nerve deficit.     Deep Tendon Reflexes: Reflexes are normal and symmetric.     Wt Readings from Last 3 Encounters:  01/16/20 240 lb (108.9 kg)  11/20/19 235 lb (106.6 kg)  07/20/19 234 lb 9.1 oz (106.4 kg)    BP 130/80   Pulse 72   Ht 5\' 7"  (1.702 m)   Wt 240 lb (108.9 kg)   BMI 37.59 kg/m   Assessment and Plan: 1. Atherosclerosis of aorta (HCC) Chronic.  Controlled.  Relatively stable.  But patient has atherosclerosis of the thoracic aorta and coronary artery disease in goal is to decrease LDL below 75.  Patient will be seen in a thoracic surgeon and that his thoracic aortic aneurysm has increased above four-point centimeters with consultation as to this position. - ezetimibe (ZETIA) 10 MG tablet; Take 1 tablet (10 mg total) by mouth daily.  Dispense: 30 tablet; Refill: 5  2. Hyperlipidemia, unspecified hyperlipidemia type Chronic.  Relatively controlled.  Stable.  Given his history of thoracic aortic aneurysm and other congestive heart failure concerns optimal lipid management is suggested and we will initiate Zetia 10 mg once a day since patient has not been able to tolerate atorvastatin due to myalgias.  We will recheck lipid in 6 months and patient  will call if he has any issues with this new medication regimen. - ezetimibe (ZETIA) 10 MG tablet; Take 1 tablet (10 mg total) by mouth daily.  Dispense: 30 tablet; Refill: 5

## 2020-01-22 DIAGNOSIS — I712 Thoracic aortic aneurysm, without rupture: Secondary | ICD-10-CM | POA: Diagnosis not present

## 2020-01-25 ENCOUNTER — Ambulatory Visit: Payer: Medicare Other | Attending: Internal Medicine

## 2020-01-25 DIAGNOSIS — Z23 Encounter for immunization: Secondary | ICD-10-CM

## 2020-01-25 NOTE — Progress Notes (Signed)
   Covid-19 Vaccination Clinic  Name:  HRIDAY MARKIEWICZ    MRN: [id number] DOB: 04-16-1930  01/25/2020  Mr. Mudd was observed post Covid-19 immunization for 15 minutes without incident. He was provided with Vaccine Information Sheet and instruction to access the V-Safe system.   Mr. Parker was instructed to call 911 with any severe reactions post vaccine: Marland Kitchen Difficulty breathing  . Swelling of face and throat  . A fast heartbeat  . A bad rash all over body  . Dizziness and weakness   Immunizations Administered    Name Date Dose VIS Date Route   Pfizer COVID-19 Vaccine 01/25/2020  8:46 AM 0.3 mL 10/13/2019 Intramuscular   Manufacturer: [place]   Lot: [id number]   [place]: [id number]

## 2020-02-06 DIAGNOSIS — I5022 Chronic systolic (congestive) heart failure: Secondary | ICD-10-CM | POA: Diagnosis not present

## 2020-02-06 DIAGNOSIS — I712 Thoracic aortic aneurysm, without rupture: Secondary | ICD-10-CM | POA: Diagnosis not present

## 2020-02-06 DIAGNOSIS — I10 Essential (primary) hypertension: Secondary | ICD-10-CM | POA: Diagnosis not present

## 2020-02-13 DIAGNOSIS — I5022 Chronic systolic (congestive) heart failure: Secondary | ICD-10-CM | POA: Diagnosis not present

## 2020-04-19 ENCOUNTER — Other Ambulatory Visit: Payer: Self-pay | Admitting: Family Medicine

## 2020-04-19 DIAGNOSIS — E785 Hyperlipidemia, unspecified: Secondary | ICD-10-CM

## 2020-04-19 DIAGNOSIS — I700 Atherosclerosis of aorta: Secondary | ICD-10-CM

## 2020-04-19 NOTE — Telephone Encounter (Signed)
Requested medication (s) are due for refill today: No  Requested medication (s) are on the active medication list: Yes  Last refill:  01/16/20  Future visit scheduled: No  Notes to clinic:  Pharmacy asking for 90 day supply.    Requested Prescriptions  Pending Prescriptions Disp Refills   ezetimibe (ZETIA) 10 MG tablet [Pharmacy Med Name: EZETIMIBE 10 MG TAB] 90 tablet 0      Cardiovascular:  Antilipid - Sterol Transport Inhibitors Failed - 04/19/2020 11:01 AM      Failed - HDL in normal range and within 360 days    HDL  Date Value Ref Range Status  11/21/2019 32 (L) >40 mg/dL Final          Passed - Total Cholesterol in normal range and within 360 days    Cholesterol  Date Value Ref Range Status  11/21/2019 136 0 - 200 mg/dL Final          Passed - LDL in normal range and within 360 days    LDL Cholesterol  Date Value Ref Range Status  11/21/2019 87 0 - 99 mg/dL Final    Comment:           Total Cholesterol/HDL:CHD Risk Coronary Heart Disease Risk Table                     Men   Women  1/2 Average Risk   3.4   3.3  Average Risk       5.0   4.4  2 X Average Risk   9.6   7.1  3 X Average Risk  23.4   11.0        Use the calculated Patient Ratio above and the CHD Risk Table to determine the patient's CHD Risk.        ATP III CLASSIFICATION (LDL):  <100     mg/dL   Optimal  100-129  mg/dL   Near or Above                    Optimal  130-159  mg/dL   Borderline  160-189  mg/dL   High  >190     mg/dL   Very High Performed at [facility], [place], [place] [address]           Passed - Triglycerides in normal range and within 360 days    Triglycerides  Date Value Ref Range Status  11/21/2019 84 <150 mg/dL Final          Passed - Valid encounter within last 12 months    Recent Outpatient Visits           3 months ago Atherosclerosis of aorta ([place])   [facility] Juline Patch, MD   9 months ago Essential hypertension    [place], MD   11 months ago Pleural effusion with transudate   [facility] Juline Patch, MD   1 year ago Pleural effusion   [facility] Juline Patch, MD   1 year ago Establishing care with new doctor, encounter for   [facility] Juline Patch, MD

## 2020-04-24 ENCOUNTER — Encounter: Payer: Self-pay | Admitting: Hematology and Oncology

## 2020-04-24 ENCOUNTER — Other Ambulatory Visit: Payer: Self-pay

## 2020-04-24 NOTE — Progress Notes (Signed)
[facility]  [address], [address] [place], [place] [address] Phone: [phone]  Fax: [phone]  Clinic Day:  04/25/2020  Referring physician: Corey Skains, MD  Chief Complaint: Brent Hoover is a 86 y.o. male with CLL who is seen for 6 month assessment   HPI: The patient was last seen in the hematology clinic by televisit on 10/26/2019. At that time, he felt "good".  He denied any B symptoms, interval infections, adenopathy, early satiety, bruising or bleeding. Hematocrit was 45.8, hemoglobin 15.3, platelets 182,000, WBC 8,400. Creatinine was 1.25. Uric acid was 7.0. LDH was 155.  The patient was admitted to [facility] from 11/20/2019 - 11/21/2019 with chest pain and a near syncopal episode. Chest CT angiogram on 11/20/2019 revealed no evidence of pulmonary embolus.  There was a small left pleural effusion with associated pleural thickening and passive atelectasis in the left lower lobe.  Bilateral lower extremity duplex on 11/20/2019 revealed no evidence of DVT in either lower extremity.  Chest pain resolved.  Follow-up with cardiology was scheduled.  The patient saw Dr. Nehemiah Massed on 01/02/2020 and was told to start the DASH diet. Will follow up in 6 months.  The patient saw Dr. Ronnald Ramp on 01/16/2020 with complaints of shortness of breath. He was started on Zetia. Will follow up in 6 months.  The patient established care with Josem Kaufmann, PA, on 01/22/2020 for aortic surveillance. He reported dyspnea and fatigue. Aneurysm did not met size criteria for surgical intervention. Chest CT in 1 year was recommended.   The patient saw Dr. Nehemiah Massed on 02/06/2020. CHF appeared stable.  A calcium channel blocker was added for better hypertension control.  Labs on 11/20/2019 revealed a hematocrit 43.2, hemoglobin 14.3, platelets 189,000, WBC 9,900. Creatinine was 1.33.   During the interim, he has been "better". The patient wanted to clarify that he did not go to the  hospital for chest pain. He reports shortness of breath with exertion. He reports that he has been trying to lose weight but is gaining it instead. Denies any B symptoms, chest pain, palpitations, orthopnea, leg swelling, or lumps and bumps. He has not had any recent infections. He has not used any new herbal products or Ibuprofen lately.  The patient has a pacemaker; he is on CPAP for sleep apnea.  The patient received the [place] COVID-19 vaccines on 02/03/2020 and 02/58/5277 and had no complications.   Past Medical History:  Diagnosis Date  . Bladder neck obstruction   . Cataracts, bilateral   . Chronic lymphocytic leukemia (CLL), B-cell (HCC)    Lambda positive B-cell CLL  . Gout   . Hypercholesterolemia   . Hypertension   . Lymphocytosis   . Sleep apnea   . Thrombocytopenia ([place])     Past Surgical History:  Procedure Laterality Date  . COLONOSCOPY WITH PROPOFOL N/A 09/10/2015   Procedure: COLONOSCOPY WITH PROPOFOL;  Surgeon: Lucilla Lame, MD;  Location: [facility] ENDOSCOPY;  Service: Endoscopy;  Laterality: N/A;  . PROSTATE SURGERY  2008    Family History  Problem Relation Age of Onset  . Lung cancer Mother 33       deceased  . Brain cancer Son 47       pt's only son  . Polycythemia Brother        half brother  . Prostate cancer Brother     Social History:  reports that he has never smoked. He has never used smokeless tobacco. He reports that he does not drink  alcohol and does not use drugs. He reports that he does not drink alcohol or use drugs. he reports he has never smoked. He denies any known exposure to radiation or toxins. He spent several years working in [employer] His wife has dementia. The patient is accompanied by his wife today.  Allergies:  Allergies  Allergen Reactions  . Penicillins Other (See Comments)    Has patient had a PCN reaction causing immediate rash, facial/tongue/throat swelling, SOB or lightheadedness with hypotension: Unknown Has  patient had a PCN reaction causing severe rash involving mucus membranes or skin necrosis: Unknown Has patient had a PCN reaction that required hospitalization: Unknown Has patient had a PCN reaction occurring within the last 10 years: Unknown If all of the above answers are "NO", then may proceed with Cephalosporin use.    Current Medications: Current Outpatient Medications  Medication Sig Dispense Refill  . amLODipine (NORVASC) 2.5 MG tablet Take by mouth.    Marland Kitchen aspirin EC 81 MG EC tablet Take 1 tablet (81 mg total) by mouth daily. 30 tablet 0  . carvedilol (COREG) 6.25 MG tablet Take 6.25 mg by mouth 2 (two) times daily with a meal. kowalski    . ezetimibe (ZETIA) 10 MG tablet Take 1 tablet (10 mg total) by mouth daily. 90 tablet 0  . furosemide (LASIX) 40 MG tablet Take 1 tablet (40 mg total) by mouth daily. kowalski 30 tablet 0  . lisinopril (ZESTRIL) 20 MG tablet TAKE (1) TABLET BY MOUTH DAILY. 30 tablet 0  . nitroGLYCERIN (NITROSTAT) 0.4 MG SL tablet Place 1 tablet (0.4 mg total) under the tongue every 5 (five) minutes as needed for chest pain. (Patient not taking: Reported on 04/24/2020) 15 tablet 0  . tamsulosin (FLOMAX) 0.4 MG CAPS capsule Take 1 capsule (0.4 mg total) by mouth daily. Dr Rogers Blocker (Patient not taking: Reported on 04/24/2020) 90 capsule 1   No current facility-administered medications for this visit.    Review of Systems  Constitutional: Negative.  Negative for chills, diaphoresis, fever, malaise/fatigue and weight loss.       Feels "better."  HENT: Negative.  Negative for congestion, ear discharge, ear pain, hearing loss, nosebleeds, sinus pain, sore throat and tinnitus.   Eyes: Negative.  Negative for blurred vision and double vision.  Respiratory: Positive for shortness of breath (with exertion). Negative for cough, hemoptysis and sputum production.        Uses CPAP for sleep apnea  Cardiovascular: Negative.  Negative for chest pain, palpitations, orthopnea,  claudication and leg swelling.       Pacemaker.  Gastrointestinal: Negative.  Negative for abdominal pain, blood in stool, constipation, diarrhea, heartburn, melena, nausea and vomiting.  Genitourinary: Negative.  Negative for dysuria, frequency, hematuria and urgency.  Musculoskeletal: Negative.  Negative for back pain, joint pain and myalgias.  Skin: Negative.  Negative for rash.  Neurological: Negative.  Negative for dizziness, tingling, sensory change, weakness and headaches.  Endo/Heme/Allergies: Negative.  Does not bruise/bleed easily.  Psychiatric/Behavioral: Negative.  Negative for depression and memory loss. The patient is not nervous/anxious and does not have insomnia.   All other systems reviewed and are negative.  Performance status (ECOG): 1-2  Vitals Blood pressure 131/72, pulse 81, temperature 98.2 F (36.8 C), temperature source Oral, weight 245 lb 13 oz (111.5 kg), SpO2 98 %.   Physical Exam Vitals and nursing note reviewed.  Constitutional:      General: He is not in acute distress.    Appearance: He is not  diaphoretic.  HENT:     Head: Normocephalic and atraumatic.     Comments: Short gray hair and beard.  Mask.    Mouth/Throat:     Mouth: Mucous membranes are moist.     Pharynx: Oropharynx is clear.  Eyes:     General: No scleral icterus.    Extraocular Movements: Extraocular movements intact.     Conjunctiva/sclera: Conjunctivae normal.     Pupils: Pupils are equal, round, and reactive to light.     Comments: Glasses.  Blue eyes.  Cardiovascular:     Rate and Rhythm: Normal rate and regular rhythm.     Pulses: Normal pulses.     Heart sounds: Normal heart sounds. No murmur heard.   Pulmonary:     Effort: Pulmonary effort is normal. No respiratory distress.     Breath sounds: Normal breath sounds. No wheezing or rales.  Chest:     Chest wall: No tenderness.  Abdominal:     General: Bowel sounds are normal. There is no distension.     Palpations:  Abdomen is soft. There is no mass.     Tenderness: There is no abdominal tenderness. There is no guarding or rebound.  Musculoskeletal:        General: No swelling or tenderness. Normal range of motion.     Cervical back: Normal range of motion and neck supple.     Right lower leg: Edema (1-2+) present.     Left lower leg: Edema (1-2+) present.  Lymphadenopathy:     Head:     Right side of head: No preauricular, posterior auricular or occipital adenopathy.     Left side of head: No preauricular, posterior auricular or occipital adenopathy.     Cervical: No cervical adenopathy.     Upper Body:     Right upper body: No supraclavicular or axillary adenopathy.     Left upper body: No supraclavicular or axillary adenopathy.     Lower Body: No right inguinal adenopathy. No left inguinal adenopathy.  Skin:    General: Skin is warm and dry.  Neurological:     Mental Status: He is alert and oriented to person, place, and time.  Psychiatric:        Mood and Affect: Mood normal.        Behavior: Behavior normal.        Thought Content: Thought content normal.        Judgment: Judgment normal.    Appointment on 04/25/2020  Component Date Value Ref Range Status  . WBC 04/25/2020 8.0  4.0 - 10.5 K/uL Final  . RBC 04/25/2020 5.08  4.22 - 5.81 MIL/uL Final  . Hemoglobin 04/25/2020 14.7  13.0 - 17.0 g/dL Final  . HCT 04/25/2020 44.5  39 - 52 % Final  . MCV 04/25/2020 87.6  80.0 - 100.0 fL Final  . MCH 04/25/2020 28.9  26.0 - 34.0 pg Final  . MCHC 04/25/2020 33.0  30.0 - 36.0 g/dL Final  . RDW 04/25/2020 14.5  11.5 - 15.5 % Final  . Platelets 04/25/2020 153  150 - 400 K/uL Final  . nRBC 04/25/2020 0.0  0.0 - 0.2 % Final  . Neutrophils Relative % 04/25/2020 59  % Final  . Neutro Abs 04/25/2020 4.6  1.7 - 7.7 K/uL Final  . Lymphocytes Relative 04/25/2020 28  % Final  . Lymphs Abs 04/25/2020 2.2  0.7 - 4.0 K/uL Final  . Monocytes Relative 04/25/2020 9  % Final  . Monocytes Absolute  04/25/2020  0.7  0 - 1 K/uL Final  . Eosinophils Relative 04/25/2020 3  % Final  . Eosinophils Absolute 04/25/2020 0.3  0 - 0 K/uL Final  . Basophils Relative 04/25/2020 1  % Final  . Basophils Absolute 04/25/2020 0.1  0 - 0 K/uL Final  . Immature Granulocytes 04/25/2020 0  % Final  . Abs Immature Granulocytes 04/25/2020 0.02  0.00 - 0.07 K/uL Final   Performed at [facility], [address], [place], [place] [address]  . Sodium 04/25/2020 136  135 - 145 mmol/L Final  . Potassium 04/25/2020 4.2  3.5 - 5.1 mmol/L Final  . Chloride 04/25/2020 102  98 - 111 mmol/L Final  . CO2 04/25/2020 26  22 - 32 mmol/L Final  . Glucose, Bld 04/25/2020 109* 70 - 99 mg/dL Final   Glucose reference range applies only to samples taken after fasting for at least 8 hours.  . BUN 04/25/2020 18  8 - 23 mg/dL Final  . Creatinine, Ser 04/25/2020 1.64* 0.61 - 1.24 mg/dL Final  . Calcium 04/25/2020 8.5* 8.9 - 10.3 mg/dL Final  . Total Protein 04/25/2020 6.1* 6.5 - 8.1 g/dL Final  . Albumin 04/25/2020 4.0  3.5 - 5.0 g/dL Final  . AST 04/25/2020 20  15 - 41 U/L Final  . ALT 04/25/2020 16  0 - 44 U/L Final  . Alkaline Phosphatase 04/25/2020 40  38 - 126 U/L Final  . Total Bilirubin 04/25/2020 1.4* 0.3 - 1.2 mg/dL Final  . GFR calc non Af Amer 04/25/2020 37* >60 mL/min Final  . GFR calc Af Amer 04/25/2020 43* >60 mL/min Final  . Anion gap 04/25/2020 8  5 - 15 Final   Performed at [facility], [address], [place], [place] [address]    Assessment:  Brent Hoover is a 86 y.o. male with chronic lymphocytic leukemia. Flow cytometry revealed CLL, B-cell, CD38 negative. 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  WBCranged between 69,000 and 134,000 between 06/05/2014 - 07/27/2017. Abdomen and pelvis CTon 07/29/2017 revealed splenomegaly (volume 1000 cc). There were an abnormal number of scattered lymph nodes in the abdomen and pelvis.   He received Gazyvamonthly x  6 from 08/04/2017 - 01/05/2018. Abdomen and pelvis CTon 03/04/2018 revealed improved splenomegaly (480 cc) and resolved adenopathy. He has a developmental venous variant (double IVC). WBC has ranged between 5500 - 7800 from 09/01/2017 - 03/31/2019.  He was admitted to [facility] 03/31/2019 - 04/03/2019 with shortness of breath and a left sided pleuraleffusion.Chest CTon 04/03/2019 revealed a moderate loculated left pleural effusion with no mediastinal, hilar or axillary adenopathy. Leftthoracentesison 04/03/2019 revealed900 cc of fluidc/wa transudate.Cytologywas negative. There were reactive mesothelial cells and predominant lymphocytes. The majority of the lymphocytes stainedwith CD5(T-cell marker). There wasno co-expression of PAX 5 (B-cell marker), CD5, and CD23.Gram stain and cultures were negative.  He has undergone thoracentesis x 3 (04/03/2019, 06/252020, 05/23/2019) for a recurrent left pleural effusion.  Cytology on 05/23/2019 was negative for malignancy.  Sample had predominant small lymphocytes, few neutrophils, macrophages and eosinophils.  Flow cytometry revealed no phenotypically normal T lymphocytes comprising 98% of the cells.  There is a very small population of neoplastic B cells present, < 1% that were CD5+, CD20 23+, CD43+ and consistent with a prior diagnosis of CLL.  It seems unlikely that the small population of cells was causing the recurrent effusion.  Chest CT angiogram on 11/20/2019 revealed no evidence of pulmonary embolus.  There was a small left pleural effusion with associated pleural thickening and passive atelectasis in the left lower lobe.    The patient received the [place] COVID-19 vaccines on 02/03/2020 and 01/25/2020  Symptomatically, he denies any fevers, sweats or weight loss.  Exam reveals no adenopathy or hepatosplenomegaly.  Plan: 1.   Labs today: CBC with diff, CMP. 2.   Chronic lymphocytic leukemia Clinically, he is doing  well.  Exam reveals no adenopathy or hepatosplenomegaly. Hematocrit 44.5.  Hemoglobin 14.7.  Platelets 153,000, WBC 8000 (ANC 4600; ALC 2200).             Continue surveillance. 3. Left sided pleural effusion Etiology was secondary to cardiopulmonary issues.             Thoracentesis on 05/23/2019 revealed a very small population of neoplastic B cells likely due to contamination from blood (fluid bloody).  Chest CT angiogram on 11/20/2019 a small left pleural effusion with associated pleural thickening and passive atelectasis in the left lower lobe.  4.   Elevated creatinine  Creatinine 1.64.  Baseline creatinine 1.09-1.33 in past year.  No evidence of dehydration.    Notes indicate change in blood pressure medications.   Follow-up with PCP. 5.   Copy of labs for patient. 6.   RTC in 6 months for MD assessment and labs (CBC with diff, CMP).  I discussed the assessment and treatment plan with the patient.  The patient was provided an opportunity to ask questions and all were answered.  The patient agreed with the plan and demonstrated an understanding of the instructions.  The patient was advised to call back if the symptoms worsen or if the condition fails to improve as anticipated.   Lequita Asal, MD, PhD    04/25/2020, 2:33 PM  I, Evert Kohl, am acting as a Education administrator for Lequita Asal, MD.  I, [place] Mike Gip, MD, have reviewed the above documentation for accuracy and completeness, and I agree with the above.

## 2020-04-24 NOTE — Progress Notes (Signed)
No new changes noted today. The patient Name and DOB has been verified by phone today. 

## 2020-04-25 ENCOUNTER — Encounter: Payer: Self-pay | Admitting: Hematology and Oncology

## 2020-04-25 ENCOUNTER — Inpatient Hospital Stay: Payer: Medicare Other | Attending: Hematology and Oncology

## 2020-04-25 ENCOUNTER — Telehealth: Payer: Self-pay

## 2020-04-25 ENCOUNTER — Inpatient Hospital Stay (HOSPITAL_BASED_OUTPATIENT_CLINIC_OR_DEPARTMENT_OTHER): Payer: Medicare Other | Admitting: Hematology and Oncology

## 2020-04-25 VITALS — BP 131/72 | HR 81 | Temp 98.2°F | Wt 245.8 lb

## 2020-04-25 DIAGNOSIS — G4730 Sleep apnea, unspecified: Secondary | ICD-10-CM | POA: Insufficient documentation

## 2020-04-25 DIAGNOSIS — I509 Heart failure, unspecified: Secondary | ICD-10-CM | POA: Insufficient documentation

## 2020-04-25 DIAGNOSIS — Z801 Family history of malignant neoplasm of trachea, bronchus and lung: Secondary | ICD-10-CM | POA: Insufficient documentation

## 2020-04-25 DIAGNOSIS — C9110 Chronic lymphocytic leukemia of B-cell type not having achieved remission: Secondary | ICD-10-CM | POA: Insufficient documentation

## 2020-04-25 DIAGNOSIS — Z950 Presence of cardiac pacemaker: Secondary | ICD-10-CM | POA: Diagnosis not present

## 2020-04-25 DIAGNOSIS — E7800 Pure hypercholesterolemia, unspecified: Secondary | ICD-10-CM | POA: Insufficient documentation

## 2020-04-25 DIAGNOSIS — I110 Hypertensive heart disease with heart failure: Secondary | ICD-10-CM | POA: Insufficient documentation

## 2020-04-25 DIAGNOSIS — Z808 Family history of malignant neoplasm of other organs or systems: Secondary | ICD-10-CM | POA: Diagnosis not present

## 2020-04-25 DIAGNOSIS — Z7982 Long term (current) use of aspirin: Secondary | ICD-10-CM | POA: Insufficient documentation

## 2020-04-25 DIAGNOSIS — R17 Unspecified jaundice: Secondary | ICD-10-CM

## 2020-04-25 DIAGNOSIS — Z79899 Other long term (current) drug therapy: Secondary | ICD-10-CM | POA: Diagnosis not present

## 2020-04-25 DIAGNOSIS — Z8042 Family history of malignant neoplasm of prostate: Secondary | ICD-10-CM | POA: Insufficient documentation

## 2020-04-25 DIAGNOSIS — J90 Pleural effusion, not elsewhere classified: Secondary | ICD-10-CM

## 2020-04-25 DIAGNOSIS — R161 Splenomegaly, not elsewhere classified: Secondary | ICD-10-CM | POA: Diagnosis not present

## 2020-04-25 LAB — CBC WITH DIFFERENTIAL/PLATELET
Abs Immature Granulocytes: 0.02 10*3/uL (ref 0.00–0.07)
Basophils Absolute: 0.1 10*3/uL (ref 0.0–0.1)
Basophils Relative: 1 %
Eosinophils Absolute: 0.3 10*3/uL (ref 0.0–0.5)
Eosinophils Relative: 3 %
HCT: 44.5 % (ref 39.0–52.0)
Hemoglobin: 14.7 g/dL (ref 13.0–17.0)
Immature Granulocytes: 0 %
Lymphocytes Relative: 28 %
Lymphs Abs: 2.2 10*3/uL (ref 0.7–4.0)
MCH: 28.9 pg (ref 26.0–34.0)
MCHC: 33 g/dL (ref 30.0–36.0)
MCV: 87.6 fL (ref 80.0–100.0)
Monocytes Absolute: 0.7 10*3/uL (ref 0.1–1.0)
Monocytes Relative: 9 %
Neutro Abs: 4.6 10*3/uL (ref 1.7–7.7)
Neutrophils Relative %: 59 %
Platelets: 153 10*3/uL (ref 150–400)
RBC: 5.08 MIL/uL (ref 4.22–5.81)
RDW: 14.5 % (ref 11.5–15.5)
WBC: 8 10*3/uL (ref 4.0–10.5)
nRBC: 0 % (ref 0.0–0.2)

## 2020-04-25 LAB — COMPREHENSIVE METABOLIC PANEL
ALT: 16 U/L (ref 0–44)
AST: 20 U/L (ref 15–41)
Albumin: 4 g/dL (ref 3.5–5.0)
Alkaline Phosphatase: 40 U/L (ref 38–126)
Anion gap: 8 (ref 5–15)
BUN: 18 mg/dL (ref 8–23)
CO2: 26 mmol/L (ref 22–32)
Calcium: 8.5 mg/dL — ABNORMAL LOW (ref 8.9–10.3)
Chloride: 102 mmol/L (ref 98–111)
Creatinine, Ser: 1.64 mg/dL — ABNORMAL HIGH (ref 0.61–1.24)
GFR calc Af Amer: 43 mL/min — ABNORMAL LOW (ref >60)
GFR calc non Af Amer: 37 mL/min — ABNORMAL LOW (ref >60)
Glucose, Bld: 109 mg/dL — ABNORMAL HIGH (ref 70–99)
Potassium: 4.2 mmol/L (ref 3.5–5.1)
Sodium: 136 mmol/L (ref 135–145)
Total Bilirubin: 1.4 mg/dL — ABNORMAL HIGH (ref 0.3–1.2)
Total Protein: 6.1 g/dL — ABNORMAL LOW (ref 6.5–8.1)

## 2020-04-25 NOTE — Telephone Encounter (Signed)
The patient labs has been routed to PCP office.

## 2020-05-27 DIAGNOSIS — N401 Enlarged prostate with lower urinary tract symptoms: Secondary | ICD-10-CM | POA: Diagnosis not present

## 2020-05-27 DIAGNOSIS — E6609 Other obesity due to excess calories: Secondary | ICD-10-CM | POA: Diagnosis not present

## 2020-05-27 DIAGNOSIS — R972 Elevated prostate specific antigen [PSA]: Secondary | ICD-10-CM | POA: Diagnosis not present

## 2020-05-27 DIAGNOSIS — Z79899 Other long term (current) drug therapy: Secondary | ICD-10-CM | POA: Diagnosis not present

## 2020-06-05 DIAGNOSIS — I5022 Chronic systolic (congestive) heart failure: Secondary | ICD-10-CM | POA: Diagnosis not present

## 2020-06-05 DIAGNOSIS — I119 Hypertensive heart disease without heart failure: Secondary | ICD-10-CM | POA: Insufficient documentation

## 2020-06-05 DIAGNOSIS — I712 Thoracic aortic aneurysm, without rupture: Secondary | ICD-10-CM | POA: Diagnosis not present

## 2020-06-05 DIAGNOSIS — I110 Hypertensive heart disease with heart failure: Secondary | ICD-10-CM | POA: Insufficient documentation

## 2020-06-05 DIAGNOSIS — I10 Essential (primary) hypertension: Secondary | ICD-10-CM | POA: Diagnosis not present

## 2020-06-25 DIAGNOSIS — I5022 Chronic systolic (congestive) heart failure: Secondary | ICD-10-CM | POA: Diagnosis not present

## 2020-07-09 ENCOUNTER — Other Ambulatory Visit: Payer: Self-pay | Admitting: Internal Medicine

## 2020-07-09 ENCOUNTER — Other Ambulatory Visit: Payer: Self-pay | Admitting: Family Medicine

## 2020-07-09 DIAGNOSIS — E785 Hyperlipidemia, unspecified: Secondary | ICD-10-CM

## 2020-07-09 DIAGNOSIS — N401 Enlarged prostate with lower urinary tract symptoms: Secondary | ICD-10-CM

## 2020-07-09 DIAGNOSIS — R351 Nocturia: Secondary | ICD-10-CM

## 2020-07-09 DIAGNOSIS — I700 Atherosclerosis of aorta: Secondary | ICD-10-CM

## 2020-07-15 IMAGING — CT CT ANGIO CHEST
2 of 6 series · 17 of 46 positions shown · IV contrast (APPLIED)
Comparison: 04/03/2019

CLINICAL DATA: Sudden onset chest pain. History of chronic
lymphocytic leukemia.

EXAM:
CT ANGIOGRAPHY CHEST WITH CONTRAST
TECHNIQUE: Multidetector CT imaging of the chest was performed using the
standard protocol during bolus administration of intravenous
contrast. Multiplanar CT image reconstructions and MIPs were
obtained to evaluate the vascular anatomy.
CONTRAST:  75mL OMNIPAQUE IOHEXOL 350 MG/ML SOLN

[Series 5: thins · axial · 0.87mm/px · z∈[-345,-42]mm · 14 of 333 slices shown]
[im 15/333  lung]
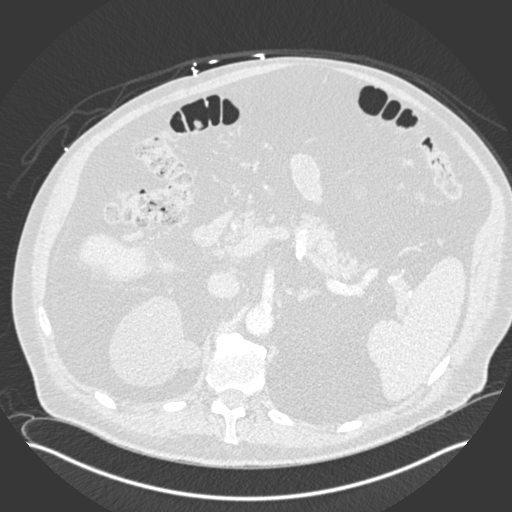
[im 44/333  soft-tissue]
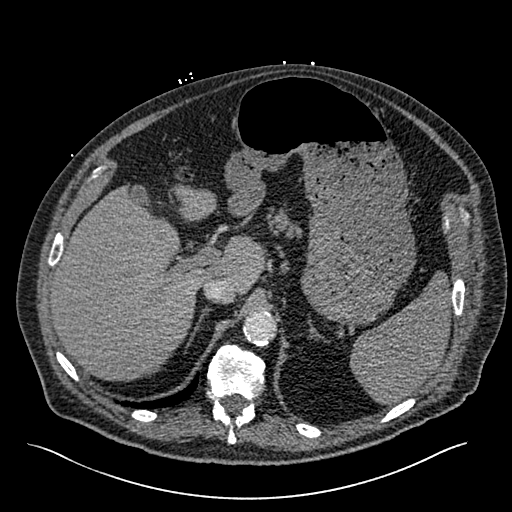
[im 58/333  lung]
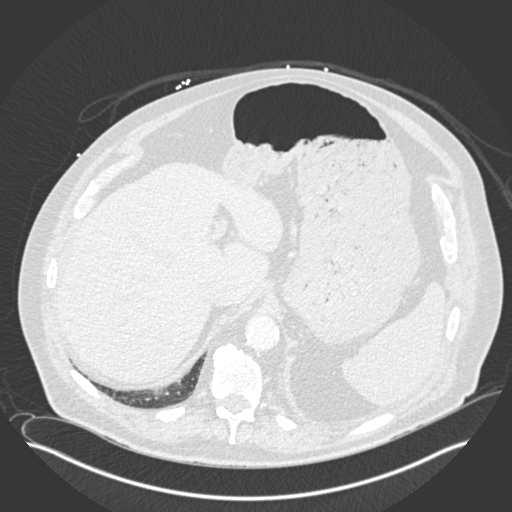
[im 87/333  soft-tissue]
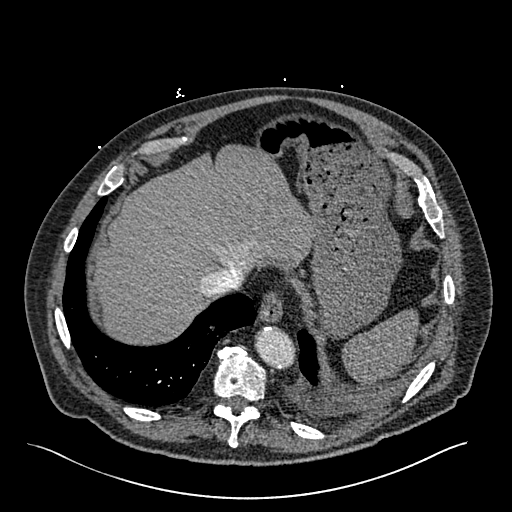
[im 116/333  lung]
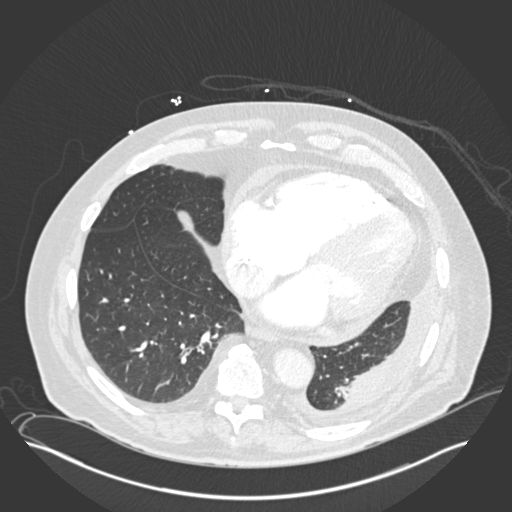
[im 130/333  soft-tissue]
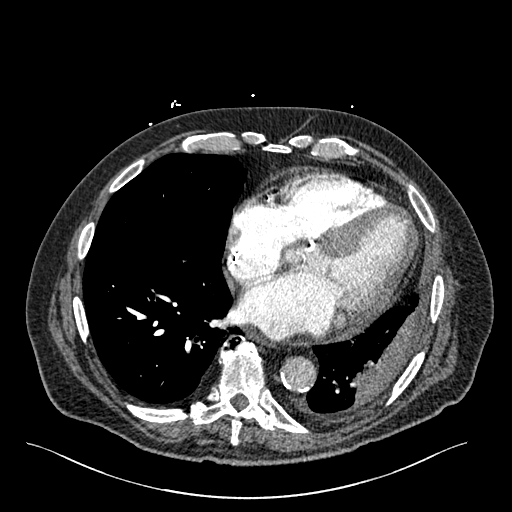
[im 159/333  lung]
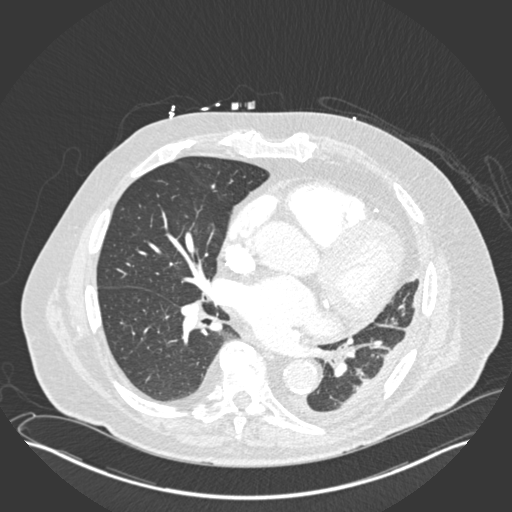
[im 174/333  soft-tissue]
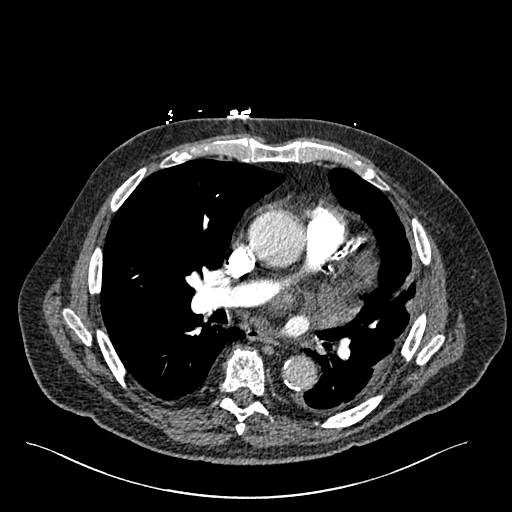
[im 203/333  lung]
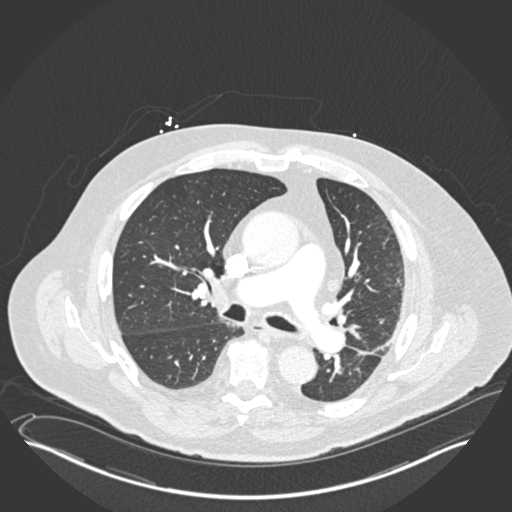
[im 217/333  soft-tissue]
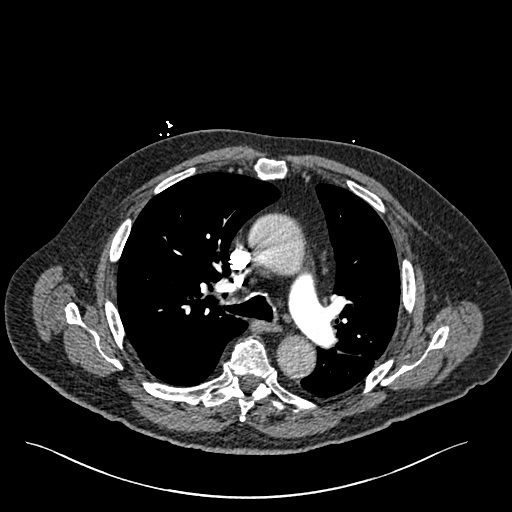
[im 246/333  lung]
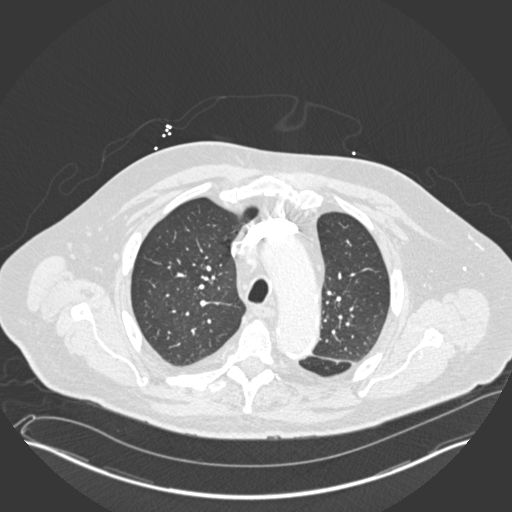
[im 275/333  soft-tissue]
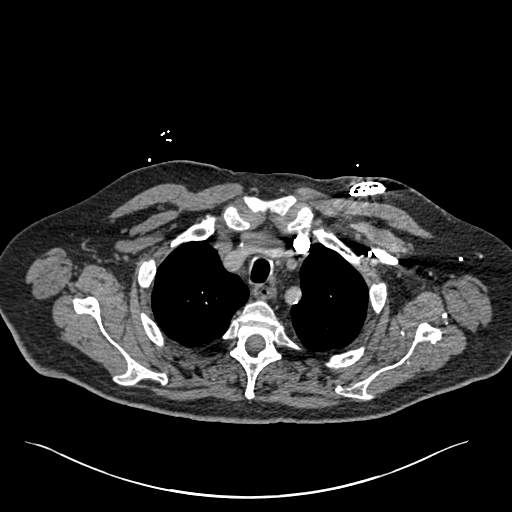
[im 289/333  lung]
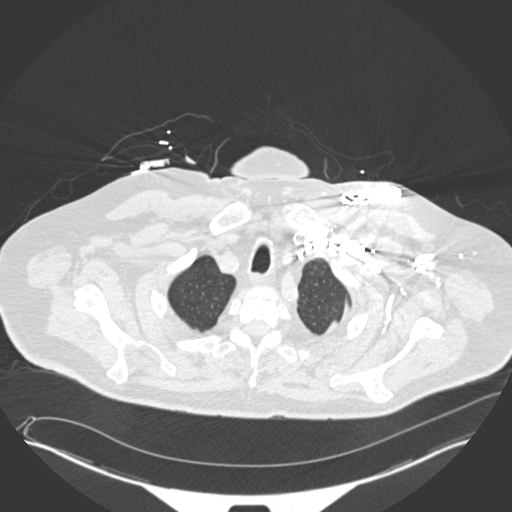
[im 318/333  soft-tissue]
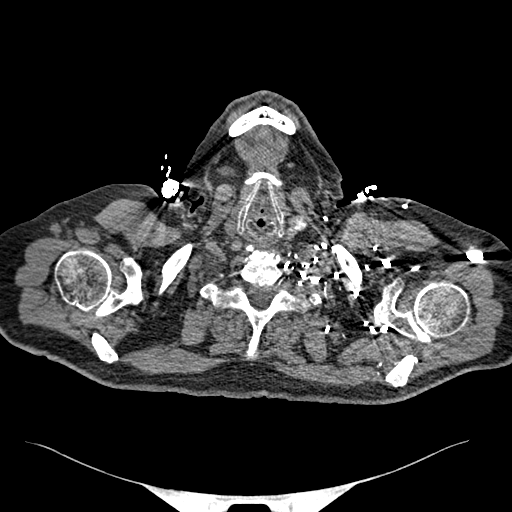

[Series 7: coronal mpr · coronal · 0.66mm/px · 3 of 100 slices shown]
[im 25/100  soft-tissue]
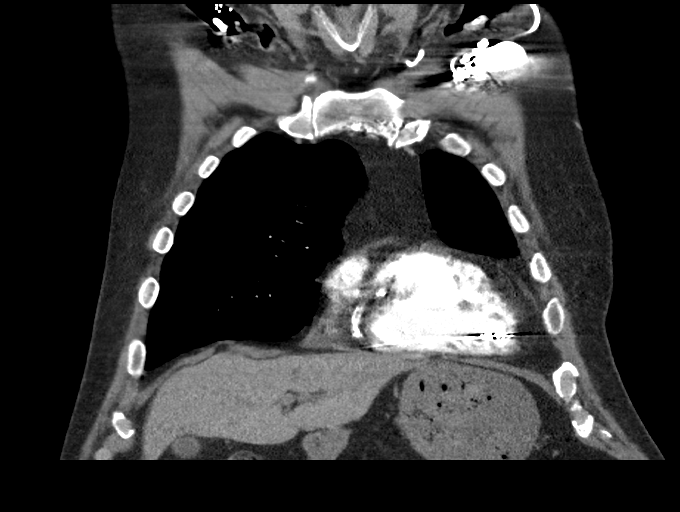
[im 50/100  soft-tissue]
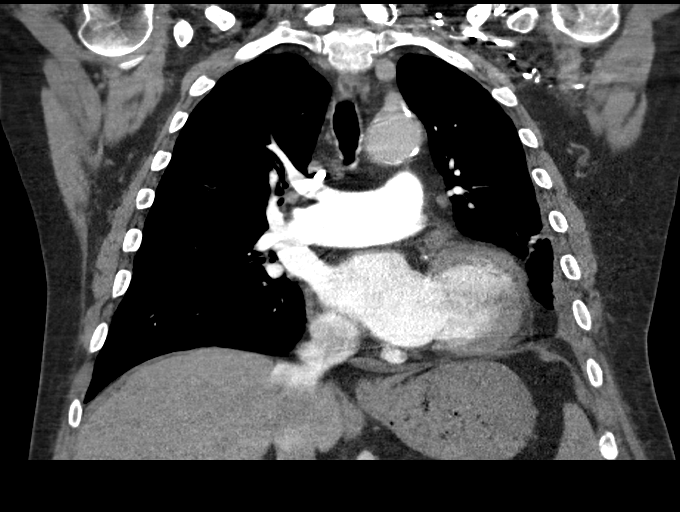
[im 75/100  soft-tissue]
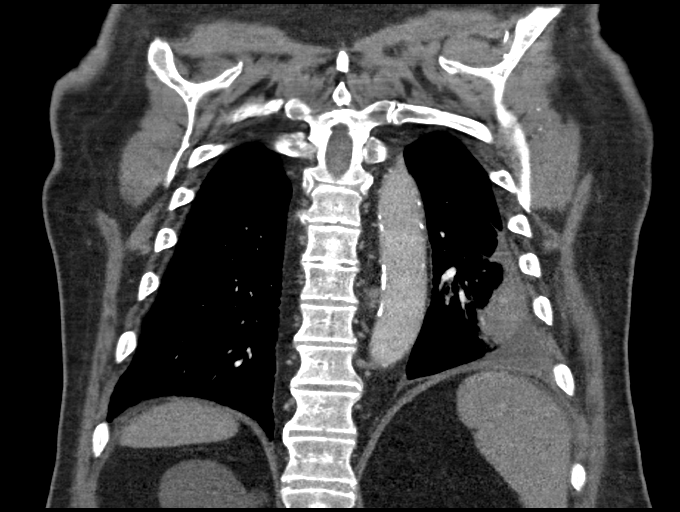

[17 of 46 positions shown; findings below may reference images not displayed]

FINDINGS: Cardiovascular: No filling defect is identified in the pulmonary
arterial tree to suggest pulmonary embolus. Coronary, aortic arch,
and branch vessel atherosclerotic vascular disease. Mild
cardiomegaly. Ascending thoracic aortic aneurysm 4.9 cm in diameter,
previously 4.8 cm.

Small pericardial effusion.

Mediastinum/Nodes: Small mediastinal lymph nodes are not
pathologically enlarged.

Lungs/Pleura: Linear subpleural nodularity in the right upper lobe
0.5 by 0.3 cm on image 36/6, stable. Bilateral airway thickening.
Small left pleural effusion with associated pleural thickening and
passive atelectasis in the left lower lobe.

Upper Abdomen: We partially include cystic lesions associated with
the right kidney.

Musculoskeletal: Thoracic spondylosis.

Review of the MIP images confirms the above findings.
IMPRESSION: 1. No filling defect is identified in the pulmonary arterial tree to
suggest pulmonary embolus.
2. Ascending thoracic aortic aneurysm 4.9 cm in diameter, previously
4.8 cm. Recommend semi-annual imaging followup by CTA or MRA and
referral to cardiothoracic surgery if not already obtained. This
recommendation follows 5131
ACCF/AHA/AATS/ACR/ASA/SCA/KLITO/DELSA/ARA/CHITOSE Guidelines for the
Diagnosis and Management of Patients With Thoracic Aortic Disease.
Circulation. 5131; 121: E266-e369. Aortic aneurysm NOS (5NRGV-5N1.5)
3. Coronary, aortic arch, and branch vessel atherosclerotic vascular
disease. Mild cardiomegaly. Small pericardial effusion. Aortic
Atherosclerosis (5NRGV-JHF.F).
4. Small left pleural effusion with associated pleural thickening
and passive atelectasis in the left lower lobe.
5. Airway thickening is present, suggesting bronchitis or reactive
airways disease.
6. Stable 3 by 5 mm right upper lobe nodule, not changed from 8108,
considered benign.

## 2020-07-16 IMAGING — DX DG CHEST 1V PORT
1 series · 1 of 1 positions shown · non-contrast
Comparison: 11/20/2019 chest radiograph.

CLINICAL DATA: Worsening dyspnea.  History of leukemia.

EXAM:
PORTABLE CHEST 1 VIEW

[chest ap]
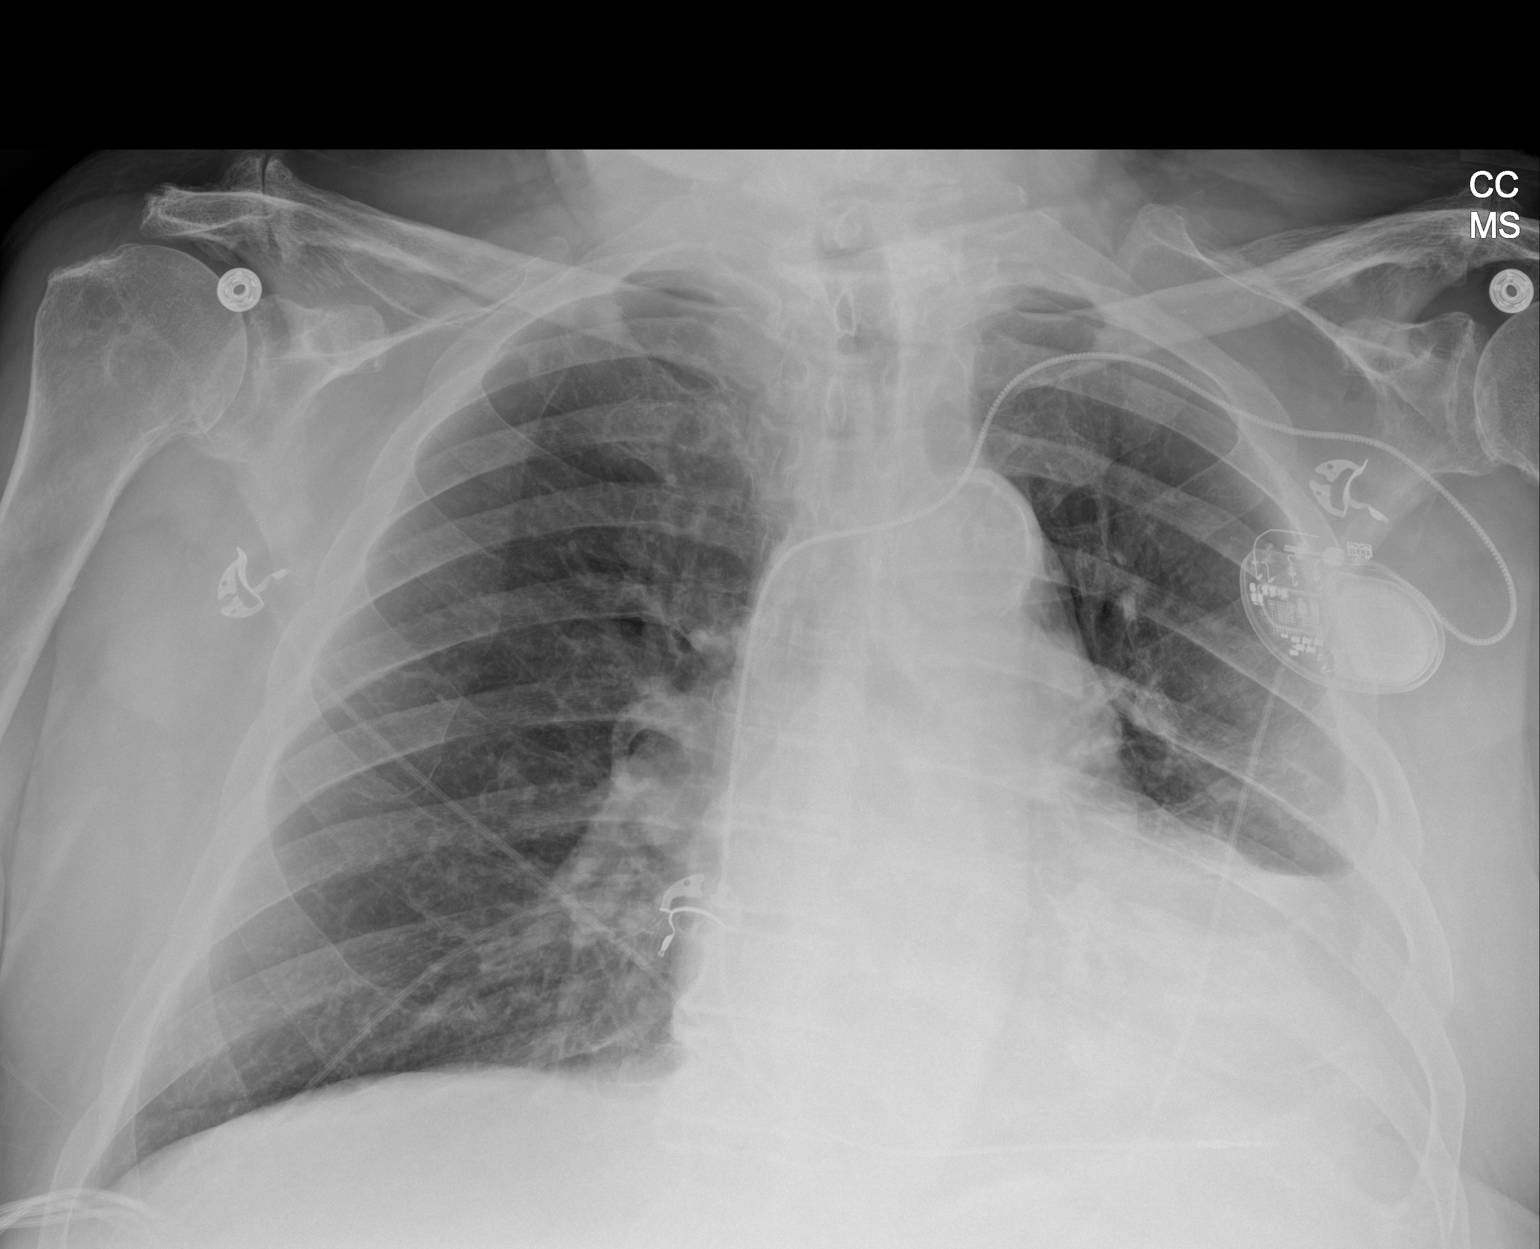

[1 of 1 positions shown; findings below may reference images not displayed]

FINDINGS: Stable configuration of single lead left subclavian pacemaker.
Stable cardiomediastinal silhouette with mild cardiomegaly. No
pneumothorax. No pleural effusion. Stable blunting of the left
costophrenic angle. No right pleural effusion. No pulmonary edema.
No acute consolidative airspace disease.
IMPRESSION: 1. Stable mild cardiomegaly without pulmonary edema.
2. Stable blunting of the left costophrenic angle, cannot exclude a
small left pleural effusion.

## 2020-07-16 IMAGING — US US EXTREM LOW VENOUS
1 series · 13 of 24 positions shown · non-contrast
Comparison: None.

CLINICAL DATA: Positive D-dimer



[Series 1: us extrem low venous · 13 of 57 slices shown]
[im 1/57]
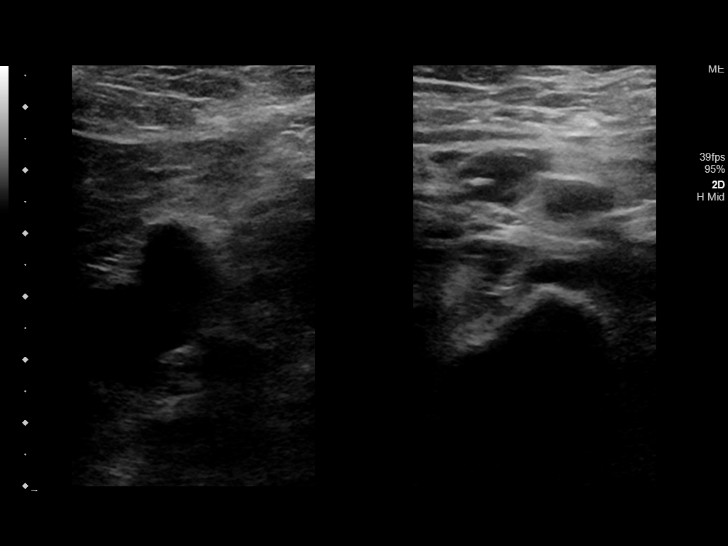
[im 5/57]
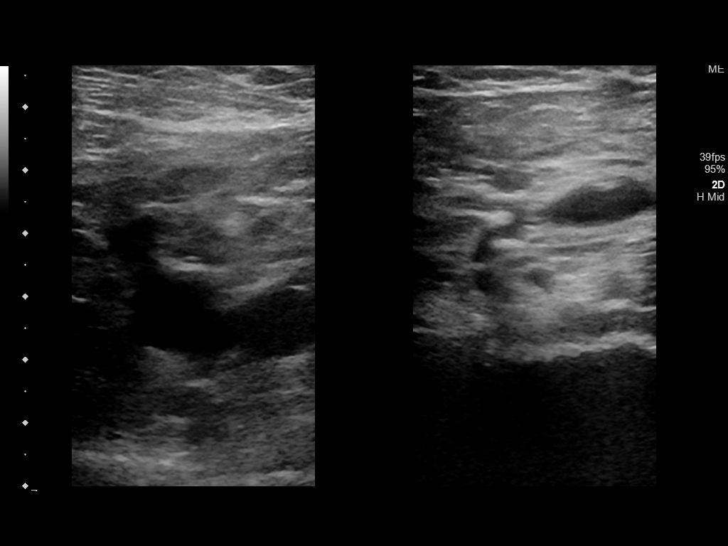
[im 10/57]
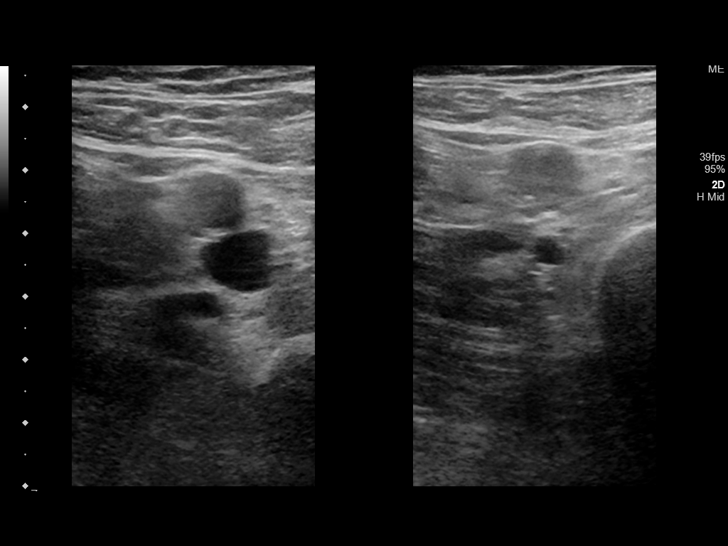
[im 15/57]
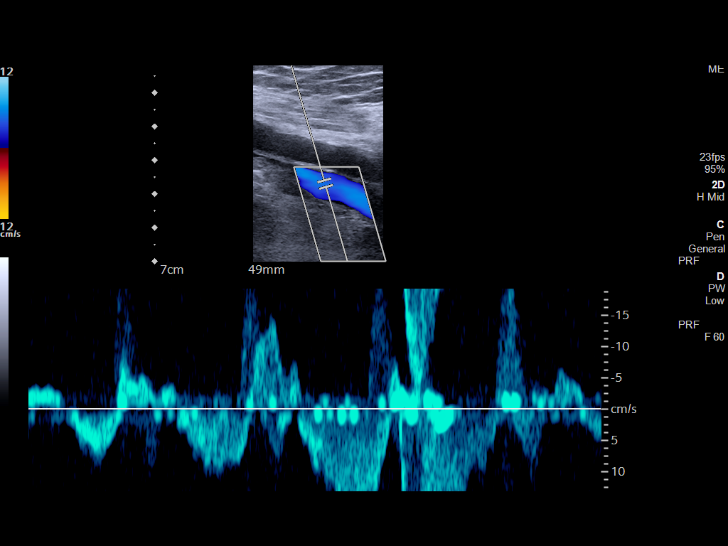
[im 20/57]
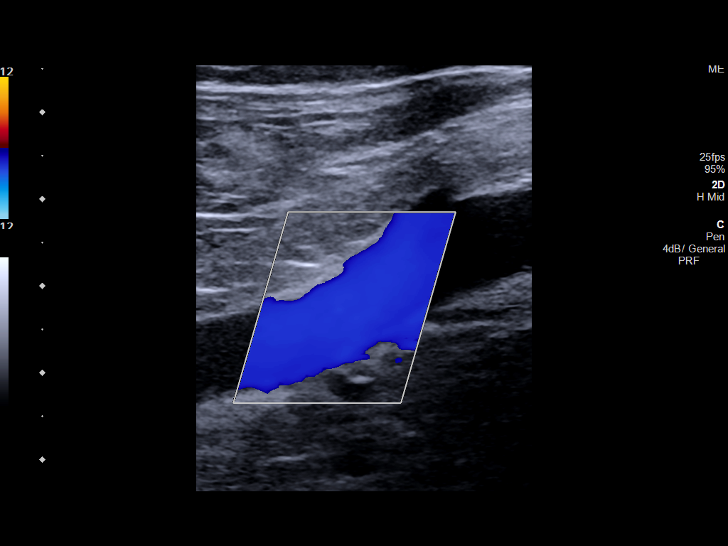
[im 25/57]
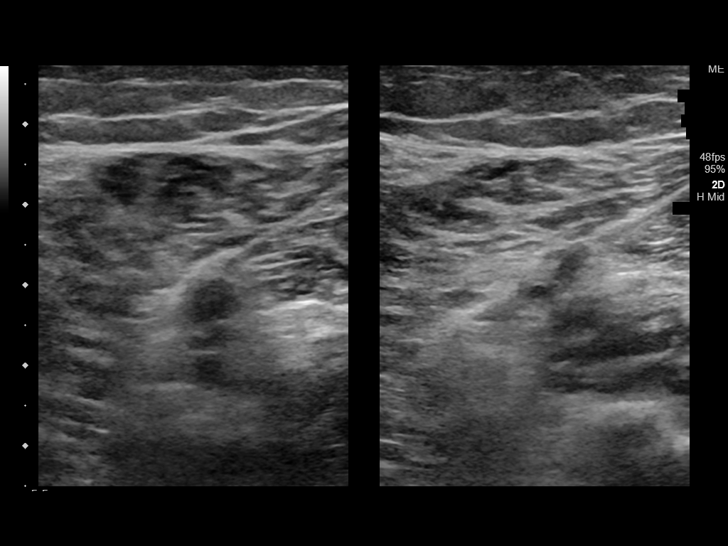
[im 30/57]
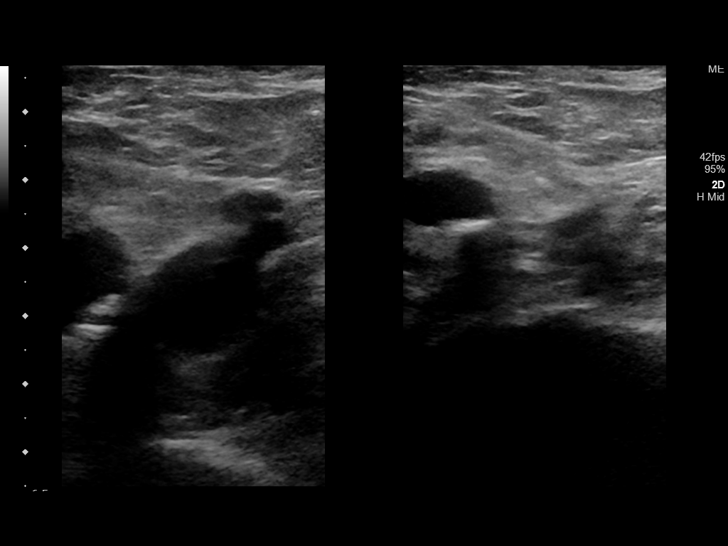
[im 32/57]
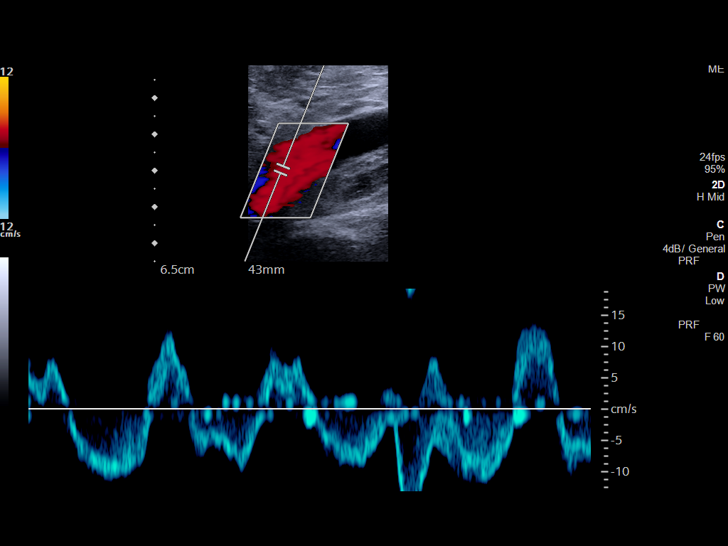
[im 37/57]
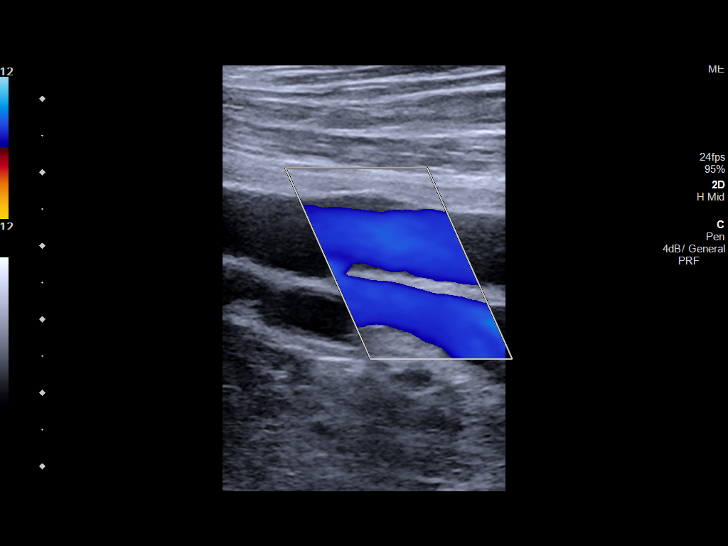
[im 42/57]
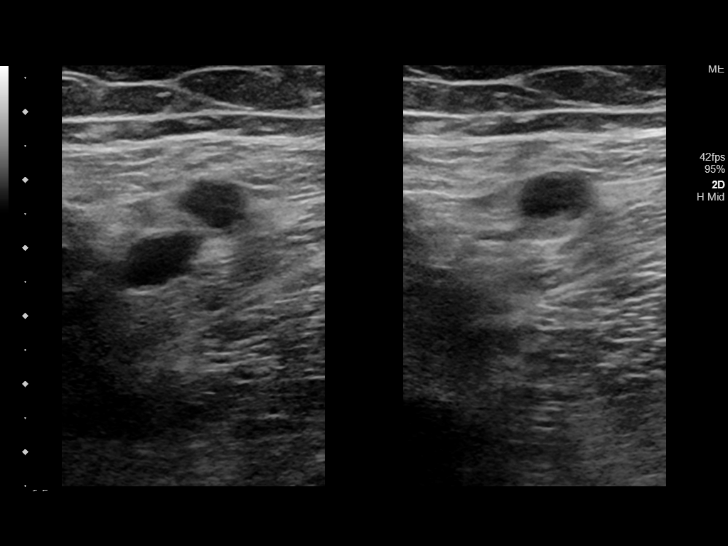
[im 47/57]
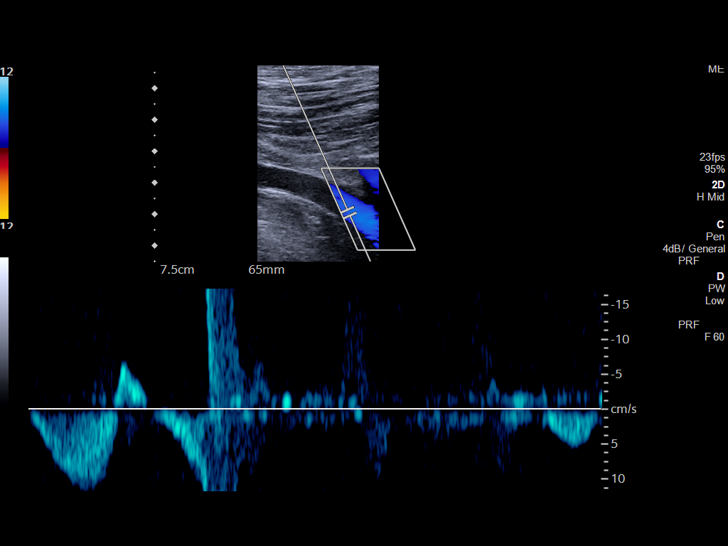
[im 52/57]
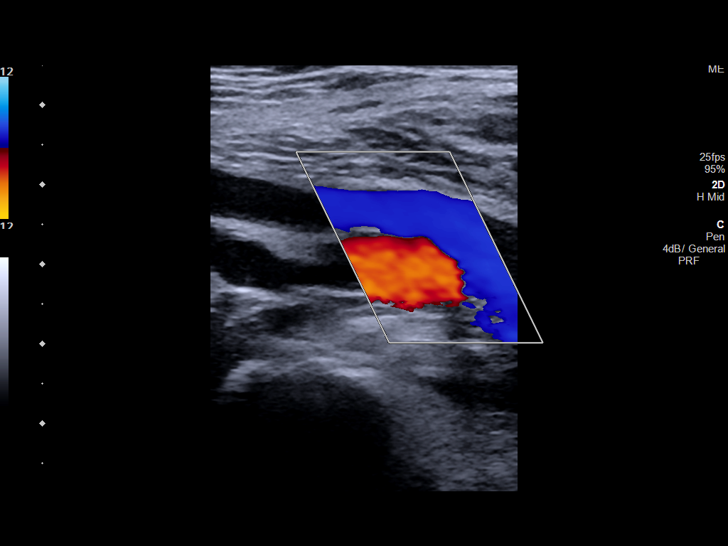
[im 57/57]
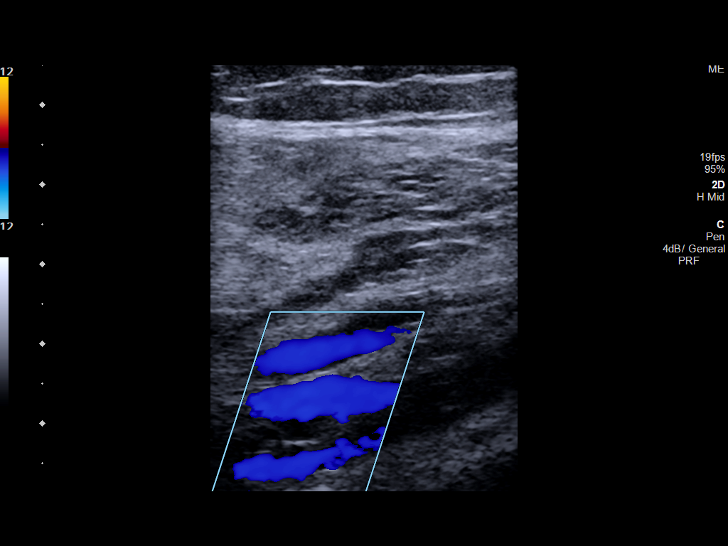

[13 of 24 positions shown; findings below may reference images not displayed]

FINDINGS: RIGHT LOWER EXTREMITY

Common Femoral Vein: No evidence of thrombus. Normal
compressibility, respiratory phasicity and response to augmentation.

Saphenofemoral Junction: No evidence of thrombus. Normal
compressibility and flow on color Doppler imaging.

Profunda Femoral Vein: No evidence of thrombus. Normal
compressibility and flow on color Doppler imaging.

Femoral Vein: No evidence of thrombus. Normal compressibility,
respiratory phasicity and response to augmentation.

Popliteal Vein: No evidence of thrombus. Normal compressibility,
respiratory phasicity and response to augmentation.

Calf Veins: No evidence of thrombus. Normal compressibility and flow
on color Doppler imaging.

Superficial Great Saphenous Vein: No evidence of thrombus. Normal
compressibility.

Venous Reflux:  None.

Other Findings:  None.

LEFT LOWER EXTREMITY

Common Femoral Vein: No evidence of thrombus. Normal
compressibility, respiratory phasicity and response to augmentation.

Saphenofemoral Junction: No evidence of thrombus. Normal
compressibility and flow on color Doppler imaging.

Profunda Femoral Vein: No evidence of thrombus. Normal
compressibility and flow on color Doppler imaging.

Femoral Vein: No evidence of thrombus. Normal compressibility,
respiratory phasicity and response to augmentation.

Popliteal Vein: No evidence of thrombus. Normal compressibility,
respiratory phasicity and response to augmentation.

Calf Veins: No evidence of thrombus. Normal compressibility and flow
on color Doppler imaging.

Superficial Great Saphenous Vein: No evidence of thrombus. Normal
compressibility.

Venous Reflux:  None.

Other Findings:  None.
IMPRESSION: No evidence of deep venous thrombosis in either lower extremity.

## 2020-09-30 DIAGNOSIS — Z23 Encounter for immunization: Secondary | ICD-10-CM | POA: Diagnosis not present

## 2020-10-09 ENCOUNTER — Other Ambulatory Visit: Payer: Self-pay | Admitting: Family Medicine

## 2020-10-09 DIAGNOSIS — R351 Nocturia: Secondary | ICD-10-CM

## 2020-10-09 DIAGNOSIS — N401 Enlarged prostate with lower urinary tract symptoms: Secondary | ICD-10-CM

## 2020-10-09 NOTE — Telephone Encounter (Signed)
Approved per protocol.  Requested Prescriptions  Pending Prescriptions Disp Refills  . tamsulosin (FLOMAX) 0.4 MG CAPS capsule [Pharmacy Med Name: TAMSULOSIN HCL 0.4 MG CAP] 90 capsule 0    Sig: TAKE (1) CAPSULE BY MOUTH EVERY DAY     Urology: Alpha-Adrenergic Blocker Passed - 10/09/2020 11:54 AM      Passed - Last BP in normal range    BP Readings from Last 1 Encounters:  04/25/20 131/72         Passed - Valid encounter within last 12 months    Recent Outpatient Visits          8 months ago Atherosclerosis of aorta ([place])   [facility] Juline Patch, MD   1 year ago Essential hypertension   [facility] Juline Patch, MD   1 year ago Pleural effusion with transudate   [facility] Juline Patch, MD   1 year ago Pleural effusion   [facility] Juline Patch, MD   1 year ago Establishing care with new doctor, encounter for   [facility] Juline Patch, MD

## 2020-10-11 ENCOUNTER — Other Ambulatory Visit: Payer: Self-pay

## 2020-10-15 NOTE — Progress Notes (Signed)
[facility]  [address], [address] [place], [place] [address] Phone: [phone]  Fax: [phone]  Clinic Day:  10/16/2020  Referring physician: Corey Skains, MD  Chief Complaint: Brent Hoover is a 86 y.o. male with CLL who is seen for 6 month assessment.  HPI: The patient was last seen in the hematology clinic by televisit on 04/25/2020. At that time, he denied any fevers, sweats or weight loss.  Exam revealed no adenopathy or hepatosplenomegaly. Hematocrit was 44.5, hemoglobin 14.7, platelets 153,000, WBC 8,000. Creatinine was 1.64 (CrCl 37 ml/min). Calcium was 8.5 with an albumen of 4.0. Bilirubin was 1.4. We discussed continued surveillance.  The patient saw Dr. Nehemiah Massed on 06/05/2020.  He wished to defer treatment with Entresto.  During the interim, he has been "better than usual." He has not had any problems since his last visit, though he is tired. He has lost weight intentionally by watching his diet. His goal weight is 190-200.  His shortness of breath has improved. He is still using a CPAP machine. He has had no issues with his pacemaker.  The patient denies fevers, sweats, lumps, bumps, bruising, bleeding, dizziness and lightheadedness.   Past Medical History:  Diagnosis Date  . Bladder neck obstruction   . Cataracts, bilateral   . Chronic lymphocytic leukemia (CLL), B-cell (HCC)    Lambda positive B-cell CLL  . Gout   . Hypercholesterolemia   . Hypertension   . Lymphocytosis   . Sleep apnea   . Thrombocytopenia ([place])     Past Surgical History:  Procedure Laterality Date  . COLONOSCOPY WITH PROPOFOL N/A 09/10/2015   Procedure: COLONOSCOPY WITH PROPOFOL;  Surgeon: Lucilla Lame, MD;  Location: [facility] ENDOSCOPY;  Service: Endoscopy;  Laterality: N/A;  . PROSTATE SURGERY  2008    Family History  Problem Relation Age of Onset  . Lung cancer Mother 26       deceased  . Brain cancer Son 93       pt's only son  . Polycythemia  Brother        half brother  . Prostate cancer Brother     Social History:  reports that he has never smoked. He has never used smokeless tobacco. He reports that he does not drink alcohol and does not use drugs. He reports that he does not drink alcohol or use drugs. he reports he has never smoked. He denies any known exposure to radiation or toxins. He spent several years working in [employer] His wife has dementia. The patient is accompanied by his wife today.  Allergies:  Allergies  Allergen Reactions  . Penicillins Other (See Comments)    Has patient had a PCN reaction causing immediate rash, facial/tongue/throat swelling, SOB or lightheadedness with hypotension: Unknown Has patient had a PCN reaction causing severe rash involving mucus membranes or skin necrosis: Unknown Has patient had a PCN reaction that required hospitalization: Unknown Has patient had a PCN reaction occurring within the last 10 years: Unknown If all of the above answers are "NO", then may proceed with Cephalosporin use.    Current Medications: Current Outpatient Medications  Medication Sig Dispense Refill  . amLODipine (NORVASC) 2.5 MG tablet Take by mouth.    Marland Kitchen aspirin EC 81 MG EC tablet Take 1 tablet (81 mg total) by mouth daily. 30 tablet 0  . carvedilol (COREG) 6.25 MG tablet Take 6.25 mg by mouth 2 (two) times daily with a meal. kowalski    . ezetimibe (ZETIA)  10 MG tablet TAKE (1) TABLET BY MOUTH EVERY DAY 90 tablet 0  . lisinopril (ZESTRIL) 20 MG tablet TAKE (1) TABLET BY MOUTH DAILY. 30 tablet 0  . tamsulosin (FLOMAX) 0.4 MG CAPS capsule TAKE (1) CAPSULE BY MOUTH EVERY DAY 90 capsule 0  . furosemide (LASIX) 40 MG tablet Take 1 tablet (40 mg total) by mouth daily. kowalski 30 tablet 0  . nitroGLYCERIN (NITROSTAT) 0.4 MG SL tablet Place 1 tablet (0.4 mg total) under the tongue every 5 (five) minutes as needed for chest pain. (Patient not taking: No sig reported) 15 tablet 0   No current  facility-administered medications for this visit.    Review of Systems  Constitutional: Positive for malaise/fatigue and weight loss (10 lbs; goal weight 190-200). Negative for chills, diaphoresis and fever.       Feels "better than usual."  HENT: Negative.  Negative for congestion, ear discharge, ear pain, hearing loss, nosebleeds, sinus pain, sore throat and tinnitus.   Eyes: Negative.  Negative for blurred vision and double vision.  Respiratory: Positive for shortness of breath (with exertion, improved). Negative for cough, hemoptysis and sputum production.        Uses CPAP for sleep apnea  Cardiovascular: Negative for chest pain, palpitations, orthopnea, claudication and leg swelling.       Pacemaker.  Gastrointestinal: Negative.  Negative for abdominal pain, blood in stool, constipation, diarrhea, heartburn, melena, nausea and vomiting.  Genitourinary: Negative.  Negative for dysuria, frequency, hematuria and urgency.  Musculoskeletal: Negative.  Negative for back pain, joint pain and myalgias.  Skin: Negative.  Negative for itching and rash.  Neurological: Negative.  Negative for dizziness, tingling, sensory change, weakness and headaches.  Endo/Heme/Allergies: Negative.  Does not bruise/bleed easily.  Psychiatric/Behavioral: Negative.  Negative for depression and memory loss. The patient is not nervous/anxious and does not have insomnia.   All other systems reviewed and are negative.  Performance status (ECOG): 1-2  Vitals Blood pressure 119/75, pulse 79, temperature 97.8 F (36.6 C), temperature source Tympanic, resp. rate 18, weight 235 lb 14.3 oz (107 kg), SpO2 99 %.   Physical Exam Vitals and nursing note reviewed.  Constitutional:      General: He is not in acute distress.    Appearance: He is not diaphoretic.  HENT:     Head: Normocephalic and atraumatic.     Comments: Short gray hair and beard.  Mask.    Mouth/Throat:     Mouth: Mucous membranes are moist.      Pharynx: Oropharynx is clear.  Eyes:     General: No scleral icterus.    Extraocular Movements: Extraocular movements intact.     Conjunctiva/sclera: Conjunctivae normal.     Pupils: Pupils are equal, round, and reactive to light.     Comments: Glasses.  Blue eyes.  Cardiovascular:     Rate and Rhythm: Normal rate and regular rhythm.     Pulses: Normal pulses.     Heart sounds: Normal heart sounds. No murmur heard.   Pulmonary:     Effort: Pulmonary effort is normal. No respiratory distress.     Breath sounds: Normal breath sounds. No wheezing or rales.  Chest:     Chest wall: No tenderness.  Breasts:     Right: No axillary adenopathy or supraclavicular adenopathy.     Left: No axillary adenopathy or supraclavicular adenopathy.    Abdominal:     General: Bowel sounds are normal. There is no distension.     Palpations: Abdomen  is soft. There is no mass.     Tenderness: There is no abdominal tenderness. There is no guarding or rebound.  Musculoskeletal:        General: No swelling or tenderness. Normal range of motion.     Cervical back: Normal range of motion and neck supple.     Right lower leg: Edema (1-2+) present.     Left lower leg: Edema (1-2+) present.  Lymphadenopathy:     Head:     Right side of head: No preauricular, posterior auricular or occipital adenopathy.     Left side of head: No preauricular, posterior auricular or occipital adenopathy.     Cervical: No cervical adenopathy.     Upper Body:     Right upper body: No supraclavicular or axillary adenopathy.     Left upper body: No supraclavicular or axillary adenopathy.     Lower Body: No right inguinal adenopathy. No left inguinal adenopathy.  Skin:    General: Skin is warm and dry.  Neurological:     Mental Status: He is alert and oriented to person, place, and time.  Psychiatric:        Mood and Affect: Mood normal.        Behavior: Behavior normal.        Thought Content: Thought content normal.         Judgment: Judgment normal.    Appointment on 10/16/2020  Component Date Value Ref Range Status  . Sodium 10/16/2020 140  135 - 145 mmol/L Final  . Potassium 10/16/2020 3.6  3.5 - 5.1 mmol/L Final  . Chloride 10/16/2020 104  98 - 111 mmol/L Final  . CO2 10/16/2020 26  22 - 32 mmol/L Final  . Glucose, Bld 10/16/2020 84  70 - 99 mg/dL Final   Glucose reference range applies only to samples taken after fasting for at least 8 hours.  . BUN 10/16/2020 13  8 - 23 mg/dL Final  . Creatinine, Ser 10/16/2020 1.24  0.61 - 1.24 mg/dL Final  . Calcium 10/16/2020 8.5* 8.9 - 10.3 mg/dL Final  . Total Protein 10/16/2020 6.4* 6.5 - 8.1 g/dL Final  . Albumin 10/16/2020 4.0  3.5 - 5.0 g/dL Final  . AST 10/16/2020 22  15 - 41 U/L Final  . ALT 10/16/2020 15  0 - 44 U/L Final  . Alkaline Phosphatase 10/16/2020 38  38 - 126 U/L Final  . Total Bilirubin 10/16/2020 1.3* 0.3 - 1.2 mg/dL Final  . GFR, Estimated 10/16/2020 56* >60 mL/min Final   Comment: (NOTE) Calculated using the CKD-EPI Creatinine Equation (2021)   . Anion gap 10/16/2020 10  5 - 15 Final   Performed at [facility], [address] [place], [place] [address]  . WBC 10/16/2020 9.8  4.0 - 10.5 K/uL Final  . RBC 10/16/2020 5.19  4.22 - 5.81 MIL/uL Final  . Hemoglobin 10/16/2020 15.5  13.0 - 17.0 g/dL Final  . HCT 10/16/2020 45.6  39.0 - 52.0 % Final  . MCV 10/16/2020 87.9  80.0 - 100.0 fL Final  . MCH 10/16/2020 29.9  26.0 - 34.0 pg Final  . MCHC 10/16/2020 34.0  30.0 - 36.0 g/dL Final  . RDW 10/16/2020 13.7  11.5 - 15.5 % Final  . Platelets 10/16/2020 169  150 - 400 K/uL Final  . nRBC 10/16/2020 0.0  0.0 - 0.2 % Final  . Neutrophils Relative % 10/16/2020 52  % Final  . Neutro Abs 10/16/2020 5.1  1.7 -  7.7 K/uL Final  . Lymphocytes Relative 10/16/2020 36  % Final  . Lymphs Abs 10/16/2020 3.5  0.7 - 4.0 K/uL Final  . Monocytes Relative 10/16/2020 8  % Final  . Monocytes Absolute 10/16/2020 0.7  0.1 - 1.0 K/uL Final  .  Eosinophils Relative 10/16/2020 3  % Final  . Eosinophils Absolute 10/16/2020 0.3  0.0 - 0.5 K/uL Final  . Basophils Relative 10/16/2020 1  % Final  . Basophils Absolute 10/16/2020 0.1  0.0 - 0.1 K/uL Final  . Immature Granulocytes 10/16/2020 0  % Final  . Abs Immature Granulocytes 10/16/2020 0.03  0.00 - 0.07 K/uL Final   Performed at [facility], [address] [place], [place] [address]    Assessment:  Brent Hoover is a 86 y.o. male with chronic lymphocytic leukemia. Flow cytometry revealed CLL, B-cell, CD38 negative. 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  WBCranged between 69,000 and 134,000 between 06/05/2014 - 07/27/2017. Abdomen and pelvis CTon 07/29/2017 revealed splenomegaly (volume 1000 cc). There were an abnormal number of scattered lymph nodes in the abdomen and pelvis.   He received Gazyvamonthly x 6 from 08/04/2017 - 01/05/2018. Abdomen and pelvis CTon 03/04/2018 revealed improved splenomegaly (480 cc) and resolved adenopathy. He has a developmental venous variant (double IVC). WBC has ranged between 5500 - 7800 from 09/01/2017 - 03/31/2019.  He was admitted to [facility] 03/31/2019 - 04/03/2019 with shortness of breath and a left sided pleuraleffusion.Chest CTon 04/03/2019 revealed a moderate loculated left pleural effusion with no mediastinal, hilar or axillary adenopathy. Leftthoracentesison 04/03/2019 revealed900 cc of fluidc/wa transudate.Cytologywas negative. There were reactive mesothelial cells and predominant lymphocytes. The majority of the lymphocytes stainedwith CD5(T-cell marker). There wasno co-expression of PAX 5 (B-cell marker), CD5, and CD23.Gram stain and cultures were negative.  He has undergone thoracentesis x 3 (04/03/2019, 06/252020, 05/23/2019) for a recurrent left pleural effusion.  Cytology on 05/23/2019 was negative for malignancy.  Sample had predominant small lymphocytes, few  neutrophils, macrophages and eosinophils.  Flow cytometry revealed no phenotypically normal T lymphocytes comprising 98% of the cells.  There is a very small population of neoplastic B cells present, < 1% that were CD5+, CD20 23+, CD43+ and consistent with a prior diagnosis of CLL.  It seems unlikely that the small population of cells was causing the recurrent effusion.  Chest CT angiogram on 11/20/2019 revealed no evidence of pulmonary embolus.  There was a small left pleural effusion with associated pleural thickening and passive atelectasis in the left lower lobe.    The patient received the [place] COVID-19 vaccines on 02/03/2020 and 01/25/2020  Symptomatically, he has been "better than usual."  He has lost weight intentionally by watching his diet; goal weight is 190-200.  He denies fevers, sweats, lumps, bumps, bruising, bleeding, or infections.  Exam reveals no adenopathy or hepatosplenomegaly  Plan: 1.   Labs today: CBC with diff, CMP. 2.   Chronic lymphocytic leukemia Clinically, he continues to do well  Exam reveals no adenopathy or hepatosplenomegaly. Hematocrit 45.6.  Hemoglobin 15.5.  Platelets 169,000, WBC  9800 (ANC 5100; ALC 3500).             Continue surveillance. 3. Left sided pleural effusion Etiology was secondary to cardiopulmonary issues.             Thoracentesis on 05/23/2019 revealed a very small population of neoplastic B cells likely due to contamination from blood (fluid bloody).  Chest CT angiogram on 11/20/2019 a small  left pleural effusion with associated pleural thickening and passive atelectasis in the left lower lobe.   Clinically no significant pleural effusion.  Continue to monitor 4.   Elevated creatinine, resolved  Creatinine 1.24.  Baseline creatinine 1.09-1.33 in past year.  Continue to monitor. 5.   RN: Provide patient with a copy of his labs. 6.   RTC in 6 months for MD assessment and labs (CBC with diff,  CMP).  I discussed the assessment and treatment plan with the patient.  The patient was provided an opportunity to ask questions and all were answered.  The patient agreed with the plan and demonstrated an understanding of the instructions.  The patient was advised to call back if the symptoms worsen or if the condition fails to improve as anticipated.   Lequita Asal, MD, PhD    10/16/2020, 1:45 PM  I, Evert Kohl, am acting as a Education administrator for Lequita Asal, MD.  I, [place] Mike Gip, MD, have reviewed the above documentation for accuracy and completeness, and I agree with the above.

## 2020-10-16 ENCOUNTER — Inpatient Hospital Stay: Payer: Medicare Other | Attending: Hematology and Oncology | Admitting: Hematology and Oncology

## 2020-10-16 ENCOUNTER — Other Ambulatory Visit: Payer: Self-pay

## 2020-10-16 ENCOUNTER — Inpatient Hospital Stay: Payer: Medicare Other

## 2020-10-16 ENCOUNTER — Encounter: Payer: Self-pay | Admitting: Hematology and Oncology

## 2020-10-16 ENCOUNTER — Telehealth: Payer: Self-pay

## 2020-10-16 VITALS — BP 119/75 | HR 79 | Temp 97.8°F | Resp 18 | Wt 235.9 lb

## 2020-10-16 DIAGNOSIS — Z808 Family history of malignant neoplasm of other organs or systems: Secondary | ICD-10-CM | POA: Diagnosis not present

## 2020-10-16 DIAGNOSIS — Z801 Family history of malignant neoplasm of trachea, bronchus and lung: Secondary | ICD-10-CM | POA: Diagnosis not present

## 2020-10-16 DIAGNOSIS — C9110 Chronic lymphocytic leukemia of B-cell type not having achieved remission: Secondary | ICD-10-CM | POA: Insufficient documentation

## 2020-10-16 DIAGNOSIS — E7800 Pure hypercholesterolemia, unspecified: Secondary | ICD-10-CM | POA: Diagnosis not present

## 2020-10-16 DIAGNOSIS — Z7982 Long term (current) use of aspirin: Secondary | ICD-10-CM | POA: Insufficient documentation

## 2020-10-16 DIAGNOSIS — Z8042 Family history of malignant neoplasm of prostate: Secondary | ICD-10-CM | POA: Diagnosis not present

## 2020-10-16 DIAGNOSIS — Z79899 Other long term (current) drug therapy: Secondary | ICD-10-CM | POA: Insufficient documentation

## 2020-10-16 DIAGNOSIS — G4730 Sleep apnea, unspecified: Secondary | ICD-10-CM | POA: Diagnosis not present

## 2020-10-16 DIAGNOSIS — I10 Essential (primary) hypertension: Secondary | ICD-10-CM | POA: Insufficient documentation

## 2020-10-16 LAB — CBC WITH DIFFERENTIAL/PLATELET
Abs Immature Granulocytes: 0.03 10*3/uL (ref 0.00–0.07)
Basophils Absolute: 0.1 10*3/uL (ref 0.0–0.1)
Basophils Relative: 1 %
Eosinophils Absolute: 0.3 10*3/uL (ref 0.0–0.5)
Eosinophils Relative: 3 %
HCT: 45.6 % (ref 39.0–52.0)
Hemoglobin: 15.5 g/dL (ref 13.0–17.0)
Immature Granulocytes: 0 %
Lymphocytes Relative: 36 %
Lymphs Abs: 3.5 10*3/uL (ref 0.7–4.0)
MCH: 29.9 pg (ref 26.0–34.0)
MCHC: 34 g/dL (ref 30.0–36.0)
MCV: 87.9 fL (ref 80.0–100.0)
Monocytes Absolute: 0.7 10*3/uL (ref 0.1–1.0)
Monocytes Relative: 8 %
Neutro Abs: 5.1 10*3/uL (ref 1.7–7.7)
Neutrophils Relative %: 52 %
Platelets: 169 10*3/uL (ref 150–400)
RBC: 5.19 MIL/uL (ref 4.22–5.81)
RDW: 13.7 % (ref 11.5–15.5)
WBC: 9.8 10*3/uL (ref 4.0–10.5)
nRBC: 0 % (ref 0.0–0.2)

## 2020-10-16 LAB — COMPREHENSIVE METABOLIC PANEL
ALT: 15 U/L (ref 0–44)
AST: 22 U/L (ref 15–41)
Albumin: 4 g/dL (ref 3.5–5.0)
Alkaline Phosphatase: 38 U/L (ref 38–126)
Anion gap: 10 (ref 5–15)
BUN: 13 mg/dL (ref 8–23)
CO2: 26 mmol/L (ref 22–32)
Calcium: 8.5 mg/dL — ABNORMAL LOW (ref 8.9–10.3)
Chloride: 104 mmol/L (ref 98–111)
Creatinine, Ser: 1.24 mg/dL (ref 0.61–1.24)
GFR, Estimated: 56 mL/min — ABNORMAL LOW (ref >60)
Glucose, Bld: 84 mg/dL (ref 70–99)
Potassium: 3.6 mmol/L (ref 3.5–5.1)
Sodium: 140 mmol/L (ref 135–145)
Total Bilirubin: 1.3 mg/dL — ABNORMAL HIGH (ref 0.3–1.2)
Total Protein: 6.4 g/dL — ABNORMAL LOW (ref 6.5–8.1)

## 2020-10-16 NOTE — Telephone Encounter (Signed)
-----   Message from Lequita Asal, MD sent at 10/16/2020  2:33 PM EST ----- Regarding: Please call patient  Calcium is low.  Discuss supplemental calcium.  M  ----- Message ----- From: Buel Ream, [facility] [place] Sent: 10/16/2020   1:17 PM EST To: Lequita Asal, MD

## 2020-10-16 NOTE — Telephone Encounter (Signed)
Patient aware. Advised patient to start taking [phone] mg calcium per day

## 2020-10-25 ENCOUNTER — Other Ambulatory Visit: Payer: Self-pay | Admitting: Internal Medicine

## 2020-10-25 DIAGNOSIS — I700 Atherosclerosis of aorta: Secondary | ICD-10-CM

## 2020-10-25 DIAGNOSIS — E785 Hyperlipidemia, unspecified: Secondary | ICD-10-CM

## 2020-12-17 DIAGNOSIS — R0602 Shortness of breath: Secondary | ICD-10-CM | POA: Diagnosis not present

## 2020-12-17 DIAGNOSIS — G4733 Obstructive sleep apnea (adult) (pediatric): Secondary | ICD-10-CM | POA: Diagnosis not present

## 2020-12-17 DIAGNOSIS — I4891 Unspecified atrial fibrillation: Secondary | ICD-10-CM | POA: Diagnosis not present

## 2020-12-17 DIAGNOSIS — R0600 Dyspnea, unspecified: Secondary | ICD-10-CM | POA: Diagnosis not present

## 2020-12-17 DIAGNOSIS — I119 Hypertensive heart disease without heart failure: Secondary | ICD-10-CM | POA: Diagnosis not present

## 2020-12-17 DIAGNOSIS — I5022 Chronic systolic (congestive) heart failure: Secondary | ICD-10-CM | POA: Diagnosis not present

## 2020-12-17 DIAGNOSIS — I10 Essential (primary) hypertension: Secondary | ICD-10-CM | POA: Diagnosis not present

## 2020-12-17 DIAGNOSIS — I712 Thoracic aortic aneurysm, without rupture: Secondary | ICD-10-CM | POA: Diagnosis not present

## 2021-01-06 ENCOUNTER — Other Ambulatory Visit: Payer: Self-pay | Admitting: Family Medicine

## 2021-01-06 DIAGNOSIS — N401 Enlarged prostate with lower urinary tract symptoms: Secondary | ICD-10-CM

## 2021-01-06 NOTE — Telephone Encounter (Signed)
Requested Prescriptions  Pending Prescriptions Disp Refills  . tamsulosin (FLOMAX) 0.4 MG CAPS capsule [Pharmacy Med Name: TAMSULOSIN HCL 0.4 MG CAP] 90 capsule 0    Sig: TAKE (1) CAPSULE BY MOUTH EVERY DAY     Urology: Alpha-Adrenergic Blocker Passed - 01/06/2021 10:03 AM      Passed - Last BP in normal range    BP Readings from Last 1 Encounters:  10/16/20 119/75         Passed - Valid encounter within last 12 months    Recent Outpatient Visits          11 months ago Atherosclerosis of aorta ([place])   [facility] Juline Patch, MD   1 year ago Essential hypertension   [facility] Juline Patch, MD   1 year ago Pleural effusion with transudate   [facility] Juline Patch, MD   1 year ago Pleural effusion   [facility] Juline Patch, MD   1 year ago Establishing care with new doctor, encounter for   [facility] Juline Patch, MD

## 2021-01-20 DIAGNOSIS — I340 Nonrheumatic mitral (valve) insufficiency: Secondary | ICD-10-CM | POA: Insufficient documentation

## 2021-01-20 DIAGNOSIS — I4891 Unspecified atrial fibrillation: Secondary | ICD-10-CM | POA: Diagnosis not present

## 2021-01-20 DIAGNOSIS — Z7901 Long term (current) use of anticoagulants: Secondary | ICD-10-CM | POA: Diagnosis not present

## 2021-01-20 DIAGNOSIS — J9811 Atelectasis: Secondary | ICD-10-CM | POA: Diagnosis not present

## 2021-01-20 DIAGNOSIS — I712 Thoracic aortic aneurysm, without rupture: Secondary | ICD-10-CM | POA: Diagnosis not present

## 2021-01-22 ENCOUNTER — Other Ambulatory Visit: Payer: Self-pay | Admitting: Internal Medicine

## 2021-01-22 DIAGNOSIS — E785 Hyperlipidemia, unspecified: Secondary | ICD-10-CM

## 2021-01-22 DIAGNOSIS — I700 Atherosclerosis of aorta: Secondary | ICD-10-CM

## 2021-01-22 NOTE — Telephone Encounter (Signed)
Requested medication (s) are due for refill today: no  Requested medication (s) are on the active medication list: yes  Last refill:  12/10/2020  Future visit scheduled:no  Notes to clinic:  overdue for follow up  Message has been sent to patient to contact office    Requested Prescriptions  Pending Prescriptions Disp Refills   ezetimibe (ZETIA) 10 MG tablet [Pharmacy Med Name: EZETIMIBE 10 MG TAB] 90 tablet 0    Sig: TAKE (1) TABLET BY MOUTH EVERY DAY      Cardiovascular:  Antilipid - Sterol Transport Inhibitors Failed - 01/22/2021 10:11 AM      Failed - Total Cholesterol in normal range and within 360 days    Cholesterol  Date Value Ref Range Status  11/21/2019 136 0 - 200 mg/dL Final          Failed - LDL in normal range and within 360 days    LDL Cholesterol  Date Value Ref Range Status  11/21/2019 87 0 - 99 mg/dL Final    Comment:           Total Cholesterol/HDL:CHD Risk Coronary Heart Disease Risk Table                     Men   Women  1/2 Average Risk   3.4   3.3  Average Risk       5.0   4.4  2 X Average Risk   9.6   7.1  3 X Average Risk  23.4   11.0        Use the calculated Patient Ratio above and the CHD Risk Table to determine the patient's CHD Risk.        ATP III CLASSIFICATION (LDL):  <100     mg/dL   Optimal  100-129  mg/dL   Near or Above                    Optimal  130-159  mg/dL   Borderline  160-189  mg/dL   High  >190     mg/dL   Very High Performed at [facility], [address] [place], [place] [address]           Failed - HDL in normal range and within 360 days    HDL  Date Value Ref Range Status  11/21/2019 32 (L) >40 mg/dL Final          Failed - Triglycerides in normal range and within 360 days    Triglycerides  Date Value Ref Range Status  11/21/2019 84 <150 mg/dL Final          Failed - Valid encounter within last 12 months    Recent Outpatient Visits           1 year ago Atherosclerosis of aorta ([place])    [facility] Juline Patch, MD   1 year ago Essential hypertension   [facility] Juline Patch, MD   1 year ago Pleural effusion with transudate   [facility] Juline Patch, MD   1 year ago Pleural effusion   [facility] Juline Patch, MD   1 year ago Establishing care with new doctor, encounter for   [facility] Juline Patch, MD

## 2021-01-29 DIAGNOSIS — M25519 Pain in unspecified shoulder: Secondary | ICD-10-CM | POA: Insufficient documentation

## 2021-01-29 DIAGNOSIS — N400 Enlarged prostate without lower urinary tract symptoms: Secondary | ICD-10-CM | POA: Insufficient documentation

## 2021-01-29 DIAGNOSIS — W03XXXA Other fall on same level due to collision with another person, initial encounter: Secondary | ICD-10-CM | POA: Insufficient documentation

## 2021-01-29 DIAGNOSIS — D126 Benign neoplasm of colon, unspecified: Secondary | ICD-10-CM | POA: Insufficient documentation

## 2021-01-29 DIAGNOSIS — R5381 Other malaise: Secondary | ICD-10-CM | POA: Insufficient documentation

## 2021-01-29 DIAGNOSIS — F528 Other sexual dysfunction not due to a substance or known physiological condition: Secondary | ICD-10-CM | POA: Insufficient documentation

## 2021-01-29 DIAGNOSIS — E669 Obesity, unspecified: Secondary | ICD-10-CM | POA: Insufficient documentation

## 2021-01-29 DIAGNOSIS — I455 Other specified heart block: Secondary | ICD-10-CM | POA: Insufficient documentation

## 2021-01-29 DIAGNOSIS — M109 Gout, unspecified: Secondary | ICD-10-CM | POA: Insufficient documentation

## 2021-01-29 DIAGNOSIS — R972 Elevated prostate specific antigen [PSA]: Secondary | ICD-10-CM | POA: Insufficient documentation

## 2021-01-29 DIAGNOSIS — I5022 Chronic systolic (congestive) heart failure: Secondary | ICD-10-CM | POA: Diagnosis not present

## 2021-01-29 DIAGNOSIS — R0600 Dyspnea, unspecified: Secondary | ICD-10-CM | POA: Diagnosis not present

## 2021-01-29 DIAGNOSIS — E559 Vitamin D deficiency, unspecified: Secondary | ICD-10-CM | POA: Insufficient documentation

## 2021-01-29 DIAGNOSIS — IMO0001 Reserved for inherently not codable concepts without codable children: Secondary | ICD-10-CM | POA: Insufficient documentation

## 2021-01-29 DIAGNOSIS — Z86010 Personal history of colonic polyps: Secondary | ICD-10-CM | POA: Insufficient documentation

## 2021-02-05 DIAGNOSIS — I4891 Unspecified atrial fibrillation: Secondary | ICD-10-CM | POA: Diagnosis not present

## 2021-02-05 DIAGNOSIS — G4733 Obstructive sleep apnea (adult) (pediatric): Secondary | ICD-10-CM | POA: Diagnosis not present

## 2021-02-05 DIAGNOSIS — I340 Nonrheumatic mitral (valve) insufficiency: Secondary | ICD-10-CM | POA: Diagnosis not present

## 2021-02-05 DIAGNOSIS — I350 Nonrheumatic aortic (valve) stenosis: Secondary | ICD-10-CM | POA: Insufficient documentation

## 2021-02-05 DIAGNOSIS — Z950 Presence of cardiac pacemaker: Secondary | ICD-10-CM | POA: Insufficient documentation

## 2021-02-05 DIAGNOSIS — I712 Thoracic aortic aneurysm, without rupture: Secondary | ICD-10-CM | POA: Diagnosis not present

## 2021-02-05 DIAGNOSIS — I10 Essential (primary) hypertension: Secondary | ICD-10-CM | POA: Diagnosis not present

## 2021-02-05 DIAGNOSIS — I700 Atherosclerosis of aorta: Secondary | ICD-10-CM | POA: Diagnosis not present

## 2021-02-05 DIAGNOSIS — I482 Chronic atrial fibrillation, unspecified: Secondary | ICD-10-CM | POA: Diagnosis not present

## 2021-02-05 DIAGNOSIS — R001 Bradycardia, unspecified: Secondary | ICD-10-CM | POA: Diagnosis not present

## 2021-02-05 DIAGNOSIS — I071 Rheumatic tricuspid insufficiency: Secondary | ICD-10-CM | POA: Insufficient documentation

## 2021-02-05 DIAGNOSIS — R0602 Shortness of breath: Secondary | ICD-10-CM | POA: Diagnosis not present

## 2021-02-05 DIAGNOSIS — I5022 Chronic systolic (congestive) heart failure: Secondary | ICD-10-CM | POA: Diagnosis not present

## 2021-02-05 DIAGNOSIS — I119 Hypertensive heart disease without heart failure: Secondary | ICD-10-CM | POA: Diagnosis not present

## 2021-02-05 DIAGNOSIS — E782 Mixed hyperlipidemia: Secondary | ICD-10-CM | POA: Diagnosis not present

## 2021-04-16 ENCOUNTER — Other Ambulatory Visit: Payer: Self-pay

## 2021-04-16 DIAGNOSIS — C9110 Chronic lymphocytic leukemia of B-cell type not having achieved remission: Secondary | ICD-10-CM

## 2021-04-17 ENCOUNTER — Inpatient Hospital Stay: Payer: Medicare Other | Attending: Hematology and Oncology | Admitting: Oncology

## 2021-04-17 ENCOUNTER — Inpatient Hospital Stay: Payer: Medicare Other

## 2021-04-17 ENCOUNTER — Encounter: Payer: Self-pay | Admitting: Oncology

## 2021-04-17 ENCOUNTER — Other Ambulatory Visit: Payer: Self-pay

## 2021-04-17 VITALS — BP 128/77 | HR 68 | Temp 97.2°F | Resp 20 | Wt 234.7 lb

## 2021-04-17 DIAGNOSIS — E785 Hyperlipidemia, unspecified: Secondary | ICD-10-CM | POA: Insufficient documentation

## 2021-04-17 DIAGNOSIS — E7800 Pure hypercholesterolemia, unspecified: Secondary | ICD-10-CM | POA: Insufficient documentation

## 2021-04-17 DIAGNOSIS — Z79899 Other long term (current) drug therapy: Secondary | ICD-10-CM | POA: Insufficient documentation

## 2021-04-17 DIAGNOSIS — IMO0001 Reserved for inherently not codable concepts without codable children: Secondary | ICD-10-CM | POA: Insufficient documentation

## 2021-04-17 DIAGNOSIS — G4730 Sleep apnea, unspecified: Secondary | ICD-10-CM | POA: Insufficient documentation

## 2021-04-17 DIAGNOSIS — Z7982 Long term (current) use of aspirin: Secondary | ICD-10-CM | POA: Diagnosis not present

## 2021-04-17 DIAGNOSIS — J90 Pleural effusion, not elsewhere classified: Secondary | ICD-10-CM | POA: Diagnosis not present

## 2021-04-17 DIAGNOSIS — I712 Thoracic aortic aneurysm, without rupture, unspecified: Secondary | ICD-10-CM | POA: Insufficient documentation

## 2021-04-17 DIAGNOSIS — Z1329 Encounter for screening for other suspected endocrine disorder: Secondary | ICD-10-CM | POA: Diagnosis not present

## 2021-04-17 DIAGNOSIS — C9110 Chronic lymphocytic leukemia of B-cell type not having achieved remission: Secondary | ICD-10-CM

## 2021-04-17 DIAGNOSIS — E079 Disorder of thyroid, unspecified: Secondary | ICD-10-CM | POA: Insufficient documentation

## 2021-04-17 DIAGNOSIS — F528 Other sexual dysfunction not due to a substance or known physiological condition: Secondary | ICD-10-CM | POA: Insufficient documentation

## 2021-04-17 DIAGNOSIS — R161 Splenomegaly, not elsewhere classified: Secondary | ICD-10-CM

## 2021-04-17 DIAGNOSIS — G4733 Obstructive sleep apnea (adult) (pediatric): Secondary | ICD-10-CM | POA: Insufficient documentation

## 2021-04-17 LAB — COMPREHENSIVE METABOLIC PANEL WITH GFR
ALT: 13 U/L (ref 0–44)
AST: 20 U/L (ref 15–41)
Albumin: 4 g/dL (ref 3.5–5.0)
Alkaline Phosphatase: 43 U/L (ref 38–126)
Anion gap: 6 (ref 5–15)
BUN: 15 mg/dL (ref 8–23)
CO2: 28 mmol/L (ref 22–32)
Calcium: 9 mg/dL (ref 8.9–10.3)
Chloride: 104 mmol/L (ref 98–111)
Creatinine, Ser: 1.28 mg/dL — ABNORMAL HIGH (ref 0.61–1.24)
GFR, Estimated: 54 mL/min — ABNORMAL LOW
Glucose, Bld: 95 mg/dL (ref 70–99)
Potassium: 4.5 mmol/L (ref 3.5–5.1)
Sodium: 138 mmol/L (ref 135–145)
Total Bilirubin: 1 mg/dL (ref 0.3–1.2)
Total Protein: 6.5 g/dL (ref 6.5–8.1)

## 2021-04-17 LAB — T4, FREE: Free T4: 1.23 ng/dL — ABNORMAL HIGH (ref 0.61–1.12)

## 2021-04-17 LAB — CBC WITH DIFFERENTIAL/PLATELET
Abs Immature Granulocytes: 0.03 10*3/uL (ref 0.00–0.07)
Basophils Absolute: 0.1 10*3/uL (ref 0.0–0.1)
Basophils Relative: 1 %
Eosinophils Absolute: 0.3 10*3/uL (ref 0.0–0.5)
Eosinophils Relative: 3 %
HCT: 45.6 % (ref 39.0–52.0)
Hemoglobin: 15.6 g/dL (ref 13.0–17.0)
Immature Granulocytes: 0 %
Lymphocytes Relative: 39 %
Lymphs Abs: 3.9 10*3/uL (ref 0.7–4.0)
MCH: 29.8 pg (ref 26.0–34.0)
MCHC: 34.2 g/dL (ref 30.0–36.0)
MCV: 87 fL (ref 80.0–100.0)
Monocytes Absolute: 0.7 10*3/uL (ref 0.1–1.0)
Monocytes Relative: 7 %
Neutro Abs: 5.2 10*3/uL (ref 1.7–7.7)
Neutrophils Relative %: 50 %
Platelets: 176 10*3/uL (ref 150–400)
RBC: 5.24 MIL/uL (ref 4.22–5.81)
RDW: 13.7 % (ref 11.5–15.5)
WBC: 10.2 10*3/uL (ref 4.0–10.5)
nRBC: 0 % (ref 0.0–0.2)

## 2021-04-17 LAB — TSH: TSH: 1.971 u[IU]/mL (ref 0.350–4.500)

## 2021-04-17 NOTE — Progress Notes (Signed)
[facility]  [address] [place], [place] [address] Phone: [phone]  Fax: [phone]  Clinic Day:  04/17/2021  Referring physician: Corey Skains, MD  Chief Complaint: Brent Hoover is a 87 y.o. male with CLL who is seen for 6 month assessment.  PERTINENT ONCOLOGY HISTORY Brent Hoover is a 87 y.o.[place] who has above oncology history reviewed by me today presented for follow up visit for CLL. Patient previously followed up by Dr.Corcoran, patient switched care to me on 04/17/21 Extensive medical record review was performed by me  Chronic lymphocytic leukemia 08/06/2015, establish care with Dr. Rogue Hoover.  At that time patient was symptomatic and was on watchful waiting. 11/05/2015  Flow cytometry revealed CLL, B-cell, CD38 negative. 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 07/27/2017, presented with constitutional symptoms with 30 to 40 pounds unintentional weight loss in 6 months, night sweats.  Early satiety,LLQ abdominal discomfort.  07/29/2017 CT abdomen and pelvis reviewed splenomegaly, lymphadenopathy in the abdominal and pelvis.  Stranding along with some omental adipose tissue.  Left lower quadrant.  08/04/2017 - 01/05/2018. Gazyva (obinutuzumab) monthly x 6  03/04/2018   Abdomen and pelvis CT on  revealed improved splenomegaly (480 cc) and resolved adenopathy.  He has a developmental venous variant (double IVC).  Subsequently patient has been on surveillance every 3 months.    03/31/2019 - 04/03/2019 admission due to shortness of breath and a left sided pleuraleffusion.  Chest CT on 04/03/2019 revealed a moderate loculated left pleural effusion with no mediastinal, hilar or axillary adenopathy.  Left thoracentesis on 04/03/2019 revealed 900 cc of fluid c/w a transudate.  Cytology was negative.  There were reactive mesothelial cells and predominant lymphocytes.  The majority of the lymphocytes stained with  CD5 (T-cell marker).  There was no co-expression of PAX 5 (B-cell marker), CD5, and CD23.  Gram stain and cultures were negative.   Additional thoracentesis 06/252020, 05/23/2019 for a recurrent left pleural effusion.  Cytology on 05/23/2019 was negative for malignancy.  Sample had predominant small lymphocytes, few neutrophils, macrophages and eosinophils.  Flow cytometry revealed no phenotypically normal T lymphocytes comprising 98% of the cells.  There is a very small population of neoplastic B cells present, < 1% that were CD5+, CD20 23+, CD43+ and consistent with a prior diagnosis of CLL.  It seems unlikely that the small population of cells was causing the recurrent effusion.     11/20/2019 CT chest angiogram revealed no evidence of pulmonary embolus.  There was a small left pleural effusion with associated pleural thickening and passive atelectasis in the left lower lobe.  Today patient reports feeling well.  Appetite is fair.  Fatigue.  Chronic shortness of breath.  Past Medical History:  Diagnosis Date   Bladder neck obstruction    Cataracts, bilateral    Chronic lymphocytic leukemia (CLL), B-cell (HCC)    Lambda positive B-cell CLL   Gout    Hypercholesterolemia    Hypertension    Lymphocytosis    Sleep apnea    Thrombocytopenia (HCC)     Past Surgical History:  Procedure Laterality Date   COLONOSCOPY WITH PROPOFOL N/A 09/10/2015   Procedure: COLONOSCOPY WITH PROPOFOL;  Surgeon: Brent Lame, MD;  Location: [facility] ENDOSCOPY;  Service: Endoscopy;  Laterality: N/A;   PROSTATE SURGERY  2008    Family History  Problem Relation Age of Onset   Lung cancer Mother 20       deceased   Brain cancer  Son 66       pt's only son   Polycythemia Brother        half brother   Prostate cancer Brother     Social History:  reports that he has never smoked. He has never used smokeless tobacco. He reports that he does not drink alcohol and does not use drugs. He reports that he does not drink  alcohol or use drugs.  he reports he has never smoked. He denies any known exposure to radiation or toxins. He spent several years working in [place] His wife has dementia. The patient is accompanied by his wife today.  Allergies:  Allergies  Allergen Reactions   Atorvastatin     Other reaction(s): Muscle pain   Penicillins Other (See Comments)    Has patient had a PCN reaction causing immediate rash, facial/tongue/throat swelling, SOB or lightheadedness with hypotension: Unknown Has patient had a PCN reaction causing severe rash involving mucus membranes or skin necrosis: Unknown Has patient had a PCN reaction that required hospitalization: Unknown Has patient had a PCN reaction occurring within the last 10 years: Unknown If all of the above answers are "NO", then may proceed with Cephalosporin use.   Terazosin     Other reaction(s): Feeling faint    Current Medications: Current Outpatient Medications  Medication Sig Dispense Refill   amLODipine (NORVASC) 2.5 MG tablet Take 2.5 mg by mouth daily.     aspirin 81 MG EC tablet Take 1 tablet by mouth daily.     aspirin EC 81 MG EC tablet Take 1 tablet (81 mg total) by mouth daily. 30 tablet 0   carvedilol (COREG) 3.125 MG tablet Take 1 tablet by mouth every 12 (twelve) hours.     ezetimibe (ZETIA) 10 MG tablet TAKE (1) TABLET BY MOUTH EVERY DAY 14 tablet 0   furosemide (LASIX) 40 MG tablet Take 1 tablet (40 mg total) by mouth daily. Brent Hoover 30 tablet 0   lisinopril (ZESTRIL) 20 MG tablet TAKE (1) TABLET BY MOUTH DAILY. 30 tablet 0   lisinopril (ZESTRIL) 40 MG tablet Take 0.5 tablets by mouth daily. (Patient not taking: Reported on 04/17/2021)     nitroGLYCERIN (NITROSTAT) 0.4 MG SL tablet Place 1 tablet (0.4 mg total) under the tongue every 5 (five) minutes as needed for chest pain. (Patient not taking: No sig reported) 15 tablet 0   tamsulosin (FLOMAX) 0.4 MG CAPS capsule TAKE (1) CAPSULE BY MOUTH EVERY DAY (Patient not taking:  Reported on 04/17/2021) 90 capsule 0   No current facility-administered medications for this visit.    Review of Systems  Constitutional:  Positive for malaise/fatigue and weight loss (10 lbs; goal weight 190-200). Negative for chills, diaphoresis and fever.       Feels "better than usual."  HENT: Negative.  Negative for congestion, ear discharge, ear pain, hearing loss, nosebleeds, sinus pain, sore throat and tinnitus.   Eyes: Negative.  Negative for blurred vision and double vision.  Respiratory:  Positive for shortness of breath (with exertion, improved). Negative for cough, hemoptysis and sputum production.        Uses CPAP for sleep apnea  Cardiovascular:  Negative for chest pain, palpitations, orthopnea, claudication and leg swelling.       Pacemaker.  Gastrointestinal: Negative.  Negative for abdominal pain, blood in stool, constipation, diarrhea, heartburn, melena, nausea and vomiting.  Genitourinary: Negative.  Negative for dysuria, frequency, hematuria and urgency.  Musculoskeletal: Negative.  Negative for back pain, joint pain and myalgias.  Skin: Negative.  Negative for itching and rash.  Neurological: Negative.  Negative for dizziness, tingling, sensory change, weakness and headaches.  Endo/Heme/Allergies: Negative.  Does not bruise/bleed easily.  Psychiatric/Behavioral: Negative.  Negative for depression and memory loss. The patient is not nervous/anxious and does not have insomnia.   All other systems reviewed and are negative.  Performance status (ECOG): 1-2  Vitals Blood pressure 128/77, pulse 68, temperature (!) 97.2 F (36.2 C), resp. rate 20, weight 234 lb 10.9 oz (106.4 kg), SpO2 97 %.   Physical Exam Vitals and nursing note reviewed.  Constitutional:      General: He is not in acute distress.    Appearance: He is not diaphoretic.  HENT:     Head: Normocephalic and atraumatic.     Mouth/Throat:     Mouth: Mucous membranes are moist.     Pharynx: Oropharynx is  clear.  Eyes:     General: No scleral icterus.    Extraocular Movements: Extraocular movements intact.     Conjunctiva/sclera: Conjunctivae normal.     Pupils: Pupils are equal, round, and reactive to light.  Cardiovascular:     Rate and Rhythm: Normal rate and regular rhythm.     Pulses: Normal pulses.     Heart sounds: Normal heart sounds. No murmur heard. Pulmonary:     Effort: Pulmonary effort is normal. No respiratory distress.     Breath sounds: Normal breath sounds. No wheezing or rales.  Chest:     Chest wall: No tenderness.  Breasts:    Right: No axillary adenopathy or supraclavicular adenopathy.     Left: No axillary adenopathy or supraclavicular adenopathy.  Abdominal:     General: Bowel sounds are normal. There is no distension.     Palpations: Abdomen is soft. There is no mass.     Tenderness: There is no abdominal tenderness. There is no guarding or rebound.  Musculoskeletal:        General: No swelling or tenderness. Normal range of motion.     Cervical back: Normal range of motion and neck supple.     Right lower leg: Edema (1-2+) present.     Left lower leg: Edema (1-2+) present.  Lymphadenopathy:     Head:     Right side of head: No preauricular, posterior auricular or occipital adenopathy.     Left side of head: No preauricular, posterior auricular or occipital adenopathy.     Cervical: No cervical adenopathy.     Upper Body:     Right upper body: No supraclavicular or axillary adenopathy.     Left upper body: No supraclavicular or axillary adenopathy.     Lower Body: No right inguinal adenopathy. No left inguinal adenopathy.  Skin:    General: Skin is warm and dry.  Neurological:     Mental Status: He is alert and oriented to person, place, and time.     Comments: Hearing loss  Psychiatric:        Mood and Affect: Mood normal.   Orders Only on 04/17/2021  Component Date Value Ref Range Status   TSH 04/17/2021 1.971  0.350 - 4.500 uIU/mL Final    Comment: Performed by a 3rd Generation assay with a functional sensitivity of <=0.01 uIU/mL. Performed at [facility], [address] [place], [place] [address]    Free T4 04/17/2021 1.23 (A) 0.61 - 1.12 ng/dL Final   Comment: (NOTE) Biotin ingestion may interfere with free T4 tests. If the results are inconsistent with  the TSH level, previous test results, or the clinical presentation, then consider biotin interference. If needed, order repeat testing after stopping biotin. Performed at [facility], [address] [place], [place] [address]   Appointment on 04/17/2021  Component Date Value Ref Range Status   Sodium 04/17/2021 138  135 - 145 mmol/L Final   Potassium 04/17/2021 4.5  3.5 - 5.1 mmol/L Final   Chloride 04/17/2021 104  98 - 111 mmol/L Final   CO2 04/17/2021 28  22 - 32 mmol/L Final   Glucose, Bld 04/17/2021 95  70 - 99 mg/dL Final   Glucose reference range applies only to samples taken after fasting for at least 8 hours.   BUN 04/17/2021 15  8 - 23 mg/dL Final   Creatinine, Ser 04/17/2021 1.28 (A) 0.61 - 1.24 mg/dL Final   Calcium 04/17/2021 9.0  8.9 - 10.3 mg/dL Final   Total Protein 04/17/2021 6.5  6.5 - 8.1 g/dL Final   Albumin 04/17/2021 4.0  3.5 - 5.0 g/dL Final   AST 04/17/2021 20  15 - 41 U/L Final   ALT 04/17/2021 13  0 - 44 U/L Final   Alkaline Phosphatase 04/17/2021 43  38 - 126 U/L Final   Total Bilirubin 04/17/2021 1.0  0.3 - 1.2 mg/dL Final   GFR, Estimated 04/17/2021 54 (A) >60 mL/min Final   Comment: (NOTE) Calculated using the CKD-EPI Creatinine Equation (2021)    Anion gap 04/17/2021 6  5 - 15 Final   Performed at [facility], [address], [place], [place] [address]   WBC 04/17/2021 10.2  4.0 - 10.5 K/uL Final   RBC 04/17/2021 5.24  4.22 - 5.81 MIL/uL Final   Hemoglobin 04/17/2021 15.6  13.0 - 17.0 g/dL Final   HCT 04/17/2021 45.6  39.0 - 52.0 % Final   MCV 04/17/2021 87.0  80.0 - 100.0 fL Final   MCH  04/17/2021 29.8  26.0 - 34.0 pg Final   MCHC 04/17/2021 34.2  30.0 - 36.0 g/dL Final   RDW 04/17/2021 13.7  11.5 - 15.5 % Final   Platelets 04/17/2021 176  150 - 400 K/uL Final   nRBC 04/17/2021 0.0  0.0 - 0.2 % Final   Neutrophils Relative % 04/17/2021 50  % Final   Neutro Abs 04/17/2021 5.2  1.7 - 7.7 K/uL Final   Lymphocytes Relative 04/17/2021 39  % Final   Lymphs Abs 04/17/2021 3.9  0.7 - 4.0 K/uL Final   Monocytes Relative 04/17/2021 7  % Final   Monocytes Absolute 04/17/2021 0.7  0.1 - 1.0 K/uL Final   Eosinophils Relative 04/17/2021 3  % Final   Eosinophils Absolute 04/17/2021 0.3  0.0 - 0.5 K/uL Final   Basophils Relative 04/17/2021 1  % Final   Basophils Absolute 04/17/2021 0.1  0.0 - 0.1 K/uL Final   Immature Granulocytes 04/17/2021 0  % Final   Abs Immature Granulocytes 04/17/2021 0.03  0.00 - 0.07 K/uL Final   Performed at [facility], [address], [place], [place] [address]    Assessment/plan 1. CLL (chronic lymphocytic leukemia) (HCC)   2. Splenomegaly   3. Pleural effusion   4. Hyperlipidemia, unspecified hyperlipidemia type   5. Thyroid disorder screening     #Chronic lymphocytic leukemia, Physical examination showed no palpable adenopathy or hepatosplenomegaly. Total white count is normal at 10.2, normal differential including normal absolute lymphocytes. Continue surveillance, watchful waiting.  # Left sided pleural effusion-  Etiology was secondary to cardiopulmonary  issues. Thoracentesis on 05/23/2019 revealed a very small population of neoplastic B cells likely due to contamination from blood (fluid bloody). Continue monitor clinically.  Currently he is asymptomatic.  #Chronic kidney disease, creatinine 1.28, continue to monitor avoid nephrotoxin.  Encourage oral hydration #Fatigue, I will check TSH and free T4  RTC in 6 months for MD assessment and labs   I discussed the assessment and treatment plan with the patient.  The patient was  provided an opportunity to ask questions and all were answered.  The patient agreed with the plan and demonstrated an understanding of the instructions.  The patient was advised to call back if the symptoms worsen or if the condition fails to improve as anticipated.   Earlie Server, MD, PhD Hematology Oncology [place] at [facility] 04/17/2021

## 2021-04-22 DIAGNOSIS — R001 Bradycardia, unspecified: Secondary | ICD-10-CM | POA: Diagnosis not present

## 2021-05-20 ENCOUNTER — Other Ambulatory Visit: Payer: Self-pay | Admitting: Family Medicine

## 2021-05-20 DIAGNOSIS — N401 Enlarged prostate with lower urinary tract symptoms: Secondary | ICD-10-CM

## 2021-05-20 NOTE — Telephone Encounter (Signed)
   Notes to clinic:  overdue for 6 month follow up  Attempted to contact patient to schedule appt  Review for refill    Requested Prescriptions  Pending Prescriptions Disp Refills   tamsulosin (FLOMAX) 0.4 MG CAPS capsule [Pharmacy Med Name: TAMSULOSIN HCL 0.4 MG CAP] 90 capsule 0    Sig: TAKE (1) Brent Hoover      Urology: Alpha-Adrenergic Blocker Failed - 05/20/2021  9:29 AM      Failed - Valid encounter within last 12 months    Recent Outpatient Visits           1 year ago Atherosclerosis of aorta ([place])   [facility] Juline Patch, MD   1 year ago Essential hypertension   [facility] Juline Patch, MD   2 years ago Pleural effusion with transudate   [facility] Juline Patch, MD   2 years ago Pleural effusion   [facility] Juline Patch, MD   2 years ago Establishing care with new doctor, encounter for   [place], MD                Passed - Last BP in normal range    BP Readings from Last 1 Encounters:  04/17/21 128/77

## 2021-06-11 DIAGNOSIS — I10 Essential (primary) hypertension: Secondary | ICD-10-CM | POA: Diagnosis not present

## 2021-06-11 DIAGNOSIS — I712 Thoracic aortic aneurysm, without rupture: Secondary | ICD-10-CM | POA: Diagnosis not present

## 2021-06-11 DIAGNOSIS — I350 Nonrheumatic aortic (valve) stenosis: Secondary | ICD-10-CM | POA: Diagnosis not present

## 2021-06-11 DIAGNOSIS — I119 Hypertensive heart disease without heart failure: Secondary | ICD-10-CM | POA: Diagnosis not present

## 2021-06-11 DIAGNOSIS — N1831 Chronic kidney disease, stage 3a: Secondary | ICD-10-CM | POA: Diagnosis not present

## 2021-06-11 DIAGNOSIS — I5022 Chronic systolic (congestive) heart failure: Secondary | ICD-10-CM | POA: Diagnosis not present

## 2021-06-11 DIAGNOSIS — E782 Mixed hyperlipidemia: Secondary | ICD-10-CM | POA: Diagnosis not present

## 2021-10-14 ENCOUNTER — Other Ambulatory Visit: Payer: Self-pay

## 2021-10-14 ENCOUNTER — Inpatient Hospital Stay: Payer: Medicare Other | Attending: Oncology

## 2021-10-14 DIAGNOSIS — Z1329 Encounter for screening for other suspected endocrine disorder: Secondary | ICD-10-CM

## 2021-10-14 DIAGNOSIS — C9110 Chronic lymphocytic leukemia of B-cell type not having achieved remission: Secondary | ICD-10-CM | POA: Diagnosis not present

## 2021-10-14 DIAGNOSIS — R161 Splenomegaly, not elsewhere classified: Secondary | ICD-10-CM

## 2021-10-14 DIAGNOSIS — J90 Pleural effusion, not elsewhere classified: Secondary | ICD-10-CM

## 2021-10-14 LAB — COMPREHENSIVE METABOLIC PANEL
ALT: 12 U/L (ref 0–44)
AST: 22 U/L (ref 15–41)
Albumin: 4 g/dL (ref 3.5–5.0)
Alkaline Phosphatase: 38 U/L (ref 38–126)
Anion gap: 10 (ref 5–15)
BUN: 16 mg/dL (ref 8–23)
CO2: 25 mmol/L (ref 22–32)
Calcium: 8.4 mg/dL — ABNORMAL LOW (ref 8.9–10.3)
Chloride: 103 mmol/L (ref 98–111)
Creatinine, Ser: 1.21 mg/dL (ref 0.61–1.24)
GFR, Estimated: 58 mL/min — ABNORMAL LOW (ref >60)
Glucose, Bld: 84 mg/dL (ref 70–99)
Potassium: 3.6 mmol/L (ref 3.5–5.1)
Sodium: 138 mmol/L (ref 135–145)
Total Bilirubin: 1 mg/dL (ref 0.3–1.2)
Total Protein: 6.5 g/dL (ref 6.5–8.1)

## 2021-10-14 LAB — CBC WITH DIFFERENTIAL/PLATELET
Abs Immature Granulocytes: 0.03 10*3/uL (ref 0.00–0.07)
Basophils Absolute: 0.1 10*3/uL (ref 0.0–0.1)
Basophils Relative: 1 %
Eosinophils Absolute: 0.2 10*3/uL (ref 0.0–0.5)
Eosinophils Relative: 2 %
HCT: 46.2 % (ref 39.0–52.0)
Hemoglobin: 15.1 g/dL (ref 13.0–17.0)
Immature Granulocytes: 0 %
Lymphocytes Relative: 38 %
Lymphs Abs: 4.8 10*3/uL — ABNORMAL HIGH (ref 0.7–4.0)
MCH: 29.3 pg (ref 26.0–34.0)
MCHC: 32.7 g/dL (ref 30.0–36.0)
MCV: 89.7 fL (ref 80.0–100.0)
Monocytes Absolute: 1.8 10*3/uL — ABNORMAL HIGH (ref 0.1–1.0)
Monocytes Relative: 14 %
Neutro Abs: 5.9 10*3/uL (ref 1.7–7.7)
Neutrophils Relative %: 45 %
Platelets: 177 10*3/uL (ref 150–400)
RBC: 5.15 MIL/uL (ref 4.22–5.81)
RDW: 13.9 % (ref 11.5–15.5)
WBC Morphology: ABNORMAL
WBC: 12.3 10*3/uL — ABNORMAL HIGH (ref 4.0–10.5)
nRBC: 0 % (ref 0.0–0.2)

## 2021-10-14 LAB — LACTATE DEHYDROGENASE: LDH: 160 U/L (ref 98–192)

## 2021-10-16 ENCOUNTER — Ambulatory Visit: Payer: Medicare Other | Admitting: Oncology

## 2021-10-17 ENCOUNTER — Encounter: Payer: Self-pay | Admitting: Oncology

## 2021-10-17 ENCOUNTER — Other Ambulatory Visit: Payer: Self-pay

## 2021-10-17 ENCOUNTER — Inpatient Hospital Stay: Payer: Medicare Other | Attending: Oncology | Admitting: Oncology

## 2021-10-17 VITALS — BP 144/81 | HR 79 | Temp 97.7°F | Resp 18 | Wt 234.1 lb

## 2021-10-17 DIAGNOSIS — N1831 Chronic kidney disease, stage 3a: Secondary | ICD-10-CM | POA: Diagnosis not present

## 2021-10-17 DIAGNOSIS — R5383 Other fatigue: Secondary | ICD-10-CM | POA: Insufficient documentation

## 2021-10-17 DIAGNOSIS — J90 Pleural effusion, not elsewhere classified: Secondary | ICD-10-CM | POA: Insufficient documentation

## 2021-10-17 DIAGNOSIS — Z7982 Long term (current) use of aspirin: Secondary | ICD-10-CM | POA: Insufficient documentation

## 2021-10-17 DIAGNOSIS — I129 Hypertensive chronic kidney disease with stage 1 through stage 4 chronic kidney disease, or unspecified chronic kidney disease: Secondary | ICD-10-CM | POA: Insufficient documentation

## 2021-10-17 DIAGNOSIS — Z79899 Other long term (current) drug therapy: Secondary | ICD-10-CM | POA: Insufficient documentation

## 2021-10-17 DIAGNOSIS — C9110 Chronic lymphocytic leukemia of B-cell type not having achieved remission: Secondary | ICD-10-CM | POA: Insufficient documentation

## 2021-10-17 NOTE — Progress Notes (Signed)
[facility] progress note  Clinic Day:  10/17/2021  Referring physician: No ref. provider found  Chief Complaint: Brent Hoover is a 87 y.o. male with CLL who is seen for 6 month assessment.  PERTINENT ONCOLOGY HISTORY Brent Hoover is a 87 y.o[place] who has above oncology history reviewed by me today presented for follow up visit for CLL. Patient previously followed up by Dr.Corcoran, patient switched care to me on 04/17/21 Extensive medical record review was performed by me  Chronic lymphocytic leukemia 08/06/2015, establish care with Dr. Rogue Bussing.  At that time patient was symptomatic and was on watchful waiting. 11/05/2015  Flow cytometry revealed CLL, B-cell, CD38 negative. 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 07/27/2017, presented with constitutional symptoms with 30 to 40 pounds unintentional weight loss in 6 months, night sweats.  Early satiety,LLQ abdominal discomfort.  07/29/2017 CT abdomen and pelvis reviewed splenomegaly, lymphadenopathy in the abdominal and pelvis.  Stranding along with some omental adipose tissue.  Left lower quadrant.  08/04/2017 - 01/05/2018. Gazyva (obinutuzumab) monthly x 6  03/04/2018   Abdomen and pelvis CT on  revealed improved splenomegaly (480 cc) and resolved adenopathy.  He has a developmental venous variant (double IVC).  Subsequently patient has been on surveillance every 3 months.    03/31/2019 - 04/03/2019 admission due to shortness of breath and a left sided pleuraleffusion.  Chest CT on 04/03/2019 revealed a moderate loculated left pleural effusion with no mediastinal, hilar or axillary adenopathy.  Left thoracentesis on 04/03/2019 revealed 900 cc of fluid c/w a transudate.  Cytology was negative.  There were reactive mesothelial cells and predominant lymphocytes.  The majority of the lymphocytes stained with CD5 (T-cell marker).  There was no co-expression of PAX 5 (B-cell marker), CD5,  and CD23.  Gram stain and cultures were negative.   Additional thoracentesis 06/252020, 05/23/2019 for a recurrent left pleural effusion.  Cytology on 05/23/2019 was negative for malignancy.  Sample had predominant small lymphocytes, few neutrophils, macrophages and eosinophils.  Flow cytometry revealed no phenotypically normal T lymphocytes comprising 98% of the cells.  There is a very small population of neoplastic B cells present, < 1% that were CD5+, CD20 23+, CD43+ and consistent with a prior diagnosis of CLL.  It seems unlikely that the small population of cells was causing the recurrent effusion.     11/20/2019 CT chest angiogram revealed no evidence of pulmonary embolus.  There was a small left pleural effusion with associated pleural thickening and passive atelectasis in the left lower lobe.  Today patient reports feeling well.  Appetite is fair.  Fatigue.  Chronic shortness of breath. INTERVAL HISTORY Brent Hoover is a 87 y.o. male who has above history reviewed by me today presents for follow up visit for CLL Patient was accompanied by his wife.  He reports feeling well.  Denies any unintentional weight loss, fever or night sweats.   Appetite is fair.  Past Medical History:  Diagnosis Date   Bladder neck obstruction    Cataracts, bilateral    Chronic lymphocytic leukemia (CLL), B-cell (HCC)    Lambda positive B-cell CLL   Gout    Hypercholesterolemia    Hypertension    Lymphocytosis    Sleep apnea    Thrombocytopenia (HCC)     Past Surgical History:  Procedure Laterality Date   COLONOSCOPY WITH PROPOFOL N/A 09/10/2015   Procedure: COLONOSCOPY WITH PROPOFOL;  Surgeon: Lucilla Lame, MD;  Location: [facility] ENDOSCOPY;  Service:  Endoscopy;  Laterality: N/A;   PROSTATE SURGERY  2008    Family History  Problem Relation Age of Onset   Lung cancer Mother 2       deceased   Brain cancer Son 79       pt's only son   Polycythemia Brother        half brother   Prostate cancer  Brother     Social History:  reports that he has never smoked. He has never used smokeless tobacco. He reports that he does not drink alcohol and does not use drugs. He reports that he does not drink alcohol or use drugs.  he reports he has never smoked. He denies any known exposure to radiation or toxins. He spent several years working in [employer] His wife has dementia.  Allergies:  Allergies  Allergen Reactions   Atorvastatin     Other reaction(s): Muscle pain   Penicillins Other (See Comments)    Has patient had a PCN reaction causing immediate rash, facial/tongue/throat swelling, SOB or lightheadedness with hypotension: Unknown Has patient had a PCN reaction causing severe rash involving mucus membranes or skin necrosis: Unknown Has patient had a PCN reaction that required hospitalization: Unknown Has patient had a PCN reaction occurring within the last 10 years: Unknown If all of the above answers are "NO", then may proceed with Cephalosporin use.   Terazosin     Other reaction(s): Feeling faint    Current Medications: Current Outpatient Medications  Medication Sig Dispense Refill   amLODipine (NORVASC) 2.5 MG tablet Take 2.5 mg by mouth daily.     aspirin 81 MG EC tablet Take 1 tablet by mouth daily.     carvedilol (COREG) 3.125 MG tablet Take 1 tablet by mouth every 12 (twelve) hours.     ezetimibe (ZETIA) 10 MG tablet TAKE (1) TABLET BY MOUTH EVERY DAY 14 tablet 0   furosemide (LASIX) 40 MG tablet Take 1 tablet (40 mg total) by mouth daily. kowalski 30 tablet 0   lisinopril (ZESTRIL) 20 MG tablet TAKE (1) TABLET BY MOUTH DAILY. 30 tablet 0   tamsulosin (FLOMAX) 0.4 MG CAPS capsule TAKE (1) CAPSULE BY MOUTH EVERY DAY 90 capsule 0   aspirin EC 81 MG EC tablet Take 1 tablet (81 mg total) by mouth daily. (Patient not taking: Reported on 10/17/2021) 30 tablet 0   nitroGLYCERIN (NITROSTAT) 0.4 MG SL tablet Place 1 tablet (0.4 mg total) under the tongue every 5 (five) minutes  as needed for chest pain. (Patient not taking: Reported on 10/17/2021) 15 tablet 0   No current facility-administered medications for this visit.    Review of Systems  Constitutional:  Positive for malaise/fatigue. Negative for chills, diaphoresis, fever and weight loss.       Feels "better than usual."  HENT: Negative.  Negative for congestion, ear discharge, ear pain, hearing loss, nosebleeds, sinus pain, sore throat and tinnitus.   Eyes: Negative.  Negative for blurred vision and double vision.  Respiratory:  Positive for shortness of breath (with exertion, improved). Negative for cough, hemoptysis and sputum production.        Uses CPAP for sleep apnea  Cardiovascular:  Negative for chest pain, palpitations, orthopnea, claudication and leg swelling.       Pacemaker.  Gastrointestinal: Negative.  Negative for abdominal pain, blood in stool, constipation, diarrhea, heartburn, melena, nausea and vomiting.  Genitourinary: Negative.  Negative for dysuria, frequency, hematuria and urgency.  Musculoskeletal: Negative.  Negative for back pain, joint  pain and myalgias.  Skin: Negative.  Negative for itching and rash.  Neurological: Negative.  Negative for dizziness, tingling, sensory change, weakness and headaches.  Endo/Heme/Allergies: Negative.  Does not bruise/bleed easily.  Psychiatric/Behavioral: Negative.  Negative for depression and memory loss. The patient is not nervous/anxious and does not have insomnia.   All other systems reviewed and are negative.  Performance status (ECOG): 1-2  Vitals Blood pressure (!) 144/81, pulse 79, temperature 97.7 F (36.5 C), resp. rate 18, weight 234 lb 1.6 oz (106.2 kg).   Physical Exam Vitals and nursing note reviewed.  Constitutional:      General: He is not in acute distress.    Appearance: He is obese. He is not diaphoretic.  HENT:     Head: Normocephalic and atraumatic.     Mouth/Throat:     Mouth: Mucous membranes are moist.      Pharynx: Oropharynx is clear.  Eyes:     General: No scleral icterus.    Extraocular Movements: Extraocular movements intact.     Conjunctiva/sclera: Conjunctivae normal.     Pupils: Pupils are equal, round, and reactive to light.  Cardiovascular:     Rate and Rhythm: Normal rate and regular rhythm.     Pulses: Normal pulses.     Heart sounds: Normal heart sounds. No murmur heard. Pulmonary:     Effort: Pulmonary effort is normal. No respiratory distress.     Breath sounds: Normal breath sounds. No wheezing or rales.  Chest:     Chest wall: No tenderness.  Abdominal:     General: Bowel sounds are normal. There is no distension.     Palpations: Abdomen is soft. There is no mass.     Tenderness: There is no abdominal tenderness. There is no guarding or rebound.  Musculoskeletal:        General: No swelling or tenderness. Normal range of motion.     Cervical back: Normal range of motion and neck supple.     Right lower leg: Edema (1-2+) present.     Left lower leg: Edema (1-2+) present.  Lymphadenopathy:     Head:     Right side of head: No preauricular, posterior auricular or occipital adenopathy.     Left side of head: No preauricular, posterior auricular or occipital adenopathy.     Cervical: No cervical adenopathy.     Upper Body:     Right upper body: No supraclavicular or axillary adenopathy.     Left upper body: No supraclavicular or axillary adenopathy.     Lower Body: No right inguinal adenopathy. No left inguinal adenopathy.  Skin:    General: Skin is warm and dry.  Neurological:     Mental Status: He is alert and oriented to person, place, and time.     Comments: Hearing loss  Psychiatric:        Mood and Affect: Mood normal.   No visits with results within 3 Day(s) from this visit.  Latest known visit with results is:  Appointment on 10/14/2021  Component Date Value Ref Range Status   LDH 10/14/2021 160  98 - 192 U/L Final   Performed at [facility], [address], [place], [place] [address]   WBC 10/14/2021 12.3 (H)  4.0 - 10.5 K/uL Final   RBC 10/14/2021 5.15  4.22 - 5.81 MIL/uL Final   Hemoglobin 10/14/2021 15.1  13.0 - 17.0 g/dL Final   HCT 10/14/2021 46.2  39.0 - 52.0 % Final  MCV 10/14/2021 89.7  80.0 - 100.0 fL Final   MCH 10/14/2021 29.3  26.0 - 34.0 pg Final   MCHC 10/14/2021 32.7  30.0 - 36.0 g/dL Final   RDW 10/14/2021 13.9  11.5 - 15.5 % Final   Platelets 10/14/2021 177  150 - 400 K/uL Final   Normal platelet morphology   nRBC 10/14/2021 0.0  0.0 - 0.2 % Final   Neutrophils Relative % 10/14/2021 45  % Final   Neutro Abs 10/14/2021 5.9  1.7 - 7.7 K/uL Final   Lymphocytes Relative 10/14/2021 38  % Final   Lymphs Abs 10/14/2021 4.8 (H)  0.7 - 4.0 K/uL Final   Monocytes Relative 10/14/2021 14  % Final   Monocytes Absolute 10/14/2021 1.8 (H)  0.1 - 1.0 K/uL Final   Eosinophils Relative 10/14/2021 2  % Final   Eosinophils Absolute 10/14/2021 0.2  0.0 - 0.5 K/uL Final   Basophils Relative 10/14/2021 1  % Final   Basophils Absolute 10/14/2021 0.1  0.0 - 0.1 K/uL Final   WBC Morphology 10/14/2021 Abnormal lymphocytes present   Final   >10% Reactive Benign Lymphoctyes   RBC Morphology 10/14/2021 MORPHOLOGY UNREMARKABLE   Final   Immature Granulocytes 10/14/2021 0  % Final   Abs Immature Granulocytes 10/14/2021 0.03  0.00 - 0.07 K/uL Final   Abnormal Lymphocytes Present 10/14/2021 PRESENT   Final   Reactive, Benign Lymphocytes 10/14/2021 PRESENT   Final   Performed at [facility], [address] [place], [place] [address]   Sodium 10/14/2021 138  135 - 145 mmol/L Final   Potassium 10/14/2021 3.6  3.5 - 5.1 mmol/L Final   Chloride 10/14/2021 103  98 - 111 mmol/L Final   CO2 10/14/2021 25  22 - 32 mmol/L Final   Glucose, Bld 10/14/2021 84  70 - 99 mg/dL Final   Glucose reference range applies only to samples taken after fasting for at least 8 hours.   BUN 10/14/2021 16  8 - 23 mg/dL Final   Creatinine,  Ser 10/14/2021 1.21  0.61 - 1.24 mg/dL Final   Calcium 10/14/2021 8.4 (L)  8.9 - 10.3 mg/dL Final   Total Protein 10/14/2021 6.5  6.5 - 8.1 g/dL Final   Albumin 10/14/2021 4.0  3.5 - 5.0 g/dL Final   AST 10/14/2021 22  15 - 41 U/L Final   ALT 10/14/2021 12  0 - 44 U/L Final   Alkaline Phosphatase 10/14/2021 38  38 - 126 U/L Final   Total Bilirubin 10/14/2021 1.0  0.3 - 1.2 mg/dL Final   GFR, Estimated 10/14/2021 58 (L)  >60 mL/min Final   Comment: (NOTE) Calculated using the CKD-EPI Creatinine Equation (2021)    Anion gap 10/14/2021 10  5 - 15 Final   Performed at [facility], [address] [place], [place] [address]    Assessment/plan 1. CLL (chronic lymphocytic leukemia) (HCC)   2. Other fatigue   3. Stage 3a chronic kidney disease (HCC)     #Chronic lymphocytic leukemia, Physical examination showed no palpable adenopathy or hepatosplenomegaly. Labs reviewed and discussed with patient Total white count slightly increased to 12.3, predominantly lymphocytosis.  Patient denies any recent history of infection, vaccination or steroid use. I discussed with the patient that this could be due to CLL progression.  Currently he has no constitutional symptoms. Continue watchful waiting.  He agrees with the plan   # Left sided pleural effusion-  Etiology was secondary to cardiopulmonary issues. Thoracentesis on 05/23/2019  revealed a very small population of neoplastic B cells likely due to contamination from blood (fluid bloody).  Currently he is asymptomatic.  Physical examination did not reveal any significant decreased lung sounds bilaterally. Monitor clinically.   #Chronic kidney disease, creatinine 1.28, continue to monitor avoid nephrotoxin.  Encourage oral hydration #Fatigue, normal TSH.  Slightly increased free T4.  Not explaining fatigue symptoms.  RTC in 3 months for MD assessment and labs   I discussed the assessment and treatment plan with the patient.  The  patient was provided an opportunity to ask questions and all were answered.  The patient agreed with the plan and demonstrated an understanding of the instructions.  The patient was advised to call back if the symptoms worsen or if the condition fails to improve as anticipated.   Earlie Server, MD, PhD 10/17/2021

## 2021-10-17 NOTE — Progress Notes (Signed)
1 

## 2022-01-05 ENCOUNTER — Other Ambulatory Visit: Payer: Self-pay

## 2022-01-05 ENCOUNTER — Inpatient Hospital Stay: Payer: Medicare Other | Attending: Oncology

## 2022-01-05 DIAGNOSIS — Z7982 Long term (current) use of aspirin: Secondary | ICD-10-CM | POA: Insufficient documentation

## 2022-01-05 DIAGNOSIS — J90 Pleural effusion, not elsewhere classified: Secondary | ICD-10-CM | POA: Diagnosis not present

## 2022-01-05 DIAGNOSIS — N1831 Chronic kidney disease, stage 3a: Secondary | ICD-10-CM | POA: Diagnosis not present

## 2022-01-05 DIAGNOSIS — Z79899 Other long term (current) drug therapy: Secondary | ICD-10-CM | POA: Insufficient documentation

## 2022-01-05 DIAGNOSIS — C9110 Chronic lymphocytic leukemia of B-cell type not having achieved remission: Secondary | ICD-10-CM

## 2022-01-05 LAB — COMPREHENSIVE METABOLIC PANEL
ALT: 14 U/L (ref 0–44)
AST: 21 U/L (ref 15–41)
Albumin: 4.4 g/dL (ref 3.5–5.0)
Alkaline Phosphatase: 40 U/L (ref 38–126)
Anion gap: 9 (ref 5–15)
BUN: 16 mg/dL (ref 8–23)
CO2: 25 mmol/L (ref 22–32)
Calcium: 8.8 mg/dL — ABNORMAL LOW (ref 8.9–10.3)
Chloride: 102 mmol/L (ref 98–111)
Creatinine, Ser: 1.34 mg/dL — ABNORMAL HIGH (ref 0.61–1.24)
GFR, Estimated: 51 mL/min — ABNORMAL LOW (ref >60)
Glucose, Bld: 108 mg/dL — ABNORMAL HIGH (ref 70–99)
Potassium: 4.5 mmol/L (ref 3.5–5.1)
Sodium: 136 mmol/L (ref 135–145)
Total Bilirubin: 1.3 mg/dL — ABNORMAL HIGH (ref 0.3–1.2)
Total Protein: 6.7 g/dL (ref 6.5–8.1)

## 2022-01-05 LAB — CBC WITH DIFFERENTIAL/PLATELET
Abs Immature Granulocytes: 0.04 10*3/uL (ref 0.00–0.07)
Basophils Absolute: 0.1 10*3/uL (ref 0.0–0.1)
Basophils Relative: 1 %
Eosinophils Absolute: 0.2 10*3/uL (ref 0.0–0.5)
Eosinophils Relative: 2 %
HCT: 50.8 % (ref 39.0–52.0)
Hemoglobin: 16.7 g/dL (ref 13.0–17.0)
Immature Granulocytes: 0 %
Lymphocytes Relative: 47 %
Lymphs Abs: 7.1 10*3/uL — ABNORMAL HIGH (ref 0.7–4.0)
MCH: 29.9 pg (ref 26.0–34.0)
MCHC: 32.9 g/dL (ref 30.0–36.0)
MCV: 91 fL (ref 80.0–100.0)
Monocytes Absolute: 1.4 10*3/uL — ABNORMAL HIGH (ref 0.1–1.0)
Monocytes Relative: 9 %
Neutro Abs: 6.1 10*3/uL (ref 1.7–7.7)
Neutrophils Relative %: 41 %
Platelets: 153 10*3/uL (ref 150–400)
RBC: 5.58 MIL/uL (ref 4.22–5.81)
RDW: 13.8 % (ref 11.5–15.5)
Smear Review: NORMAL
WBC: 15 10*3/uL — ABNORMAL HIGH (ref 4.0–10.5)
nRBC: 0 % (ref 0.0–0.2)

## 2022-01-05 LAB — LACTATE DEHYDROGENASE: LDH: 154 U/L (ref 98–192)

## 2022-01-07 ENCOUNTER — Encounter: Payer: Self-pay | Admitting: Oncology

## 2022-01-07 ENCOUNTER — Other Ambulatory Visit: Payer: Self-pay

## 2022-01-07 ENCOUNTER — Inpatient Hospital Stay (HOSPITAL_BASED_OUTPATIENT_CLINIC_OR_DEPARTMENT_OTHER): Payer: Medicare Other | Admitting: Oncology

## 2022-01-07 VITALS — BP 154/82 | HR 79 | Temp 97.6°F | Resp 18 | Wt 230.3 lb

## 2022-01-07 DIAGNOSIS — Z7982 Long term (current) use of aspirin: Secondary | ICD-10-CM | POA: Diagnosis not present

## 2022-01-07 DIAGNOSIS — N1831 Chronic kidney disease, stage 3a: Secondary | ICD-10-CM

## 2022-01-07 DIAGNOSIS — C9110 Chronic lymphocytic leukemia of B-cell type not having achieved remission: Secondary | ICD-10-CM

## 2022-01-07 DIAGNOSIS — Z79899 Other long term (current) drug therapy: Secondary | ICD-10-CM | POA: Diagnosis not present

## 2022-01-07 DIAGNOSIS — J90 Pleural effusion, not elsewhere classified: Secondary | ICD-10-CM | POA: Diagnosis not present

## 2022-01-07 LAB — COMP PANEL: LEUKEMIA/LYMPHOMA: Immunophenotypic Profile: 40

## 2022-01-07 NOTE — Progress Notes (Signed)
[facility]ology progress note  Clinic Day:  01/07/2022  Referring physician: No ref. provider found  Chief Complaint: Brent Hoover is a 88 y.o. male with presents for follow up of Brent Hoover is a 88 y.o.[place] who has above oncology history reviewed by me today presented for follow up visit for CLL. Patient previously followed up by Dr.Corcoran, patient switched care to me on 04/17/21 Extensive medical record review was performed by me  Chronic lymphocytic leukemia 08/06/2015, establish care with Dr. Rogue Bussing.  At that time patient was symptomatic and was on watchful waiting. 11/05/2015  Flow cytometry revealed CLL, B-cell, CD38 negative. 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 07/27/2017, presented with constitutional symptoms with 30 to 40 pounds unintentional weight loss in 6 months, night sweats.  Early satiety,LLQ abdominal discomfort.  07/29/2017 CT abdomen and pelvis reviewed splenomegaly, lymphadenopathy in the abdominal and pelvis.  Stranding along with some omental adipose tissue.  Left lower quadrant.  08/04/2017 - 01/05/2018. Gazyva (obinutuzumab) monthly x 6  03/04/2018   Abdomen and pelvis CT on  revealed improved splenomegaly (480 cc) and resolved adenopathy.  He has a developmental venous variant (double IVC).  Subsequently patient has been on surveillance every 3 months.    03/31/2019 - 04/03/2019 admission due to shortness of breath and a left sided pleuraleffusion.  Chest CT on 04/03/2019 revealed a moderate loculated left pleural effusion with no mediastinal, hilar or axillary adenopathy.  Left thoracentesis on 04/03/2019 revealed 900 cc of fluid c/w a transudate.  Cytology was negative.  There were reactive mesothelial cells and predominant lymphocytes.  The majority of the lymphocytes stained with CD5 (T-cell marker).  There was no co-expression of PAX 5 (B-cell marker), CD5, and CD23.   Gram stain and cultures were negative.   Additional thoracentesis 06/252020, 05/23/2019 for a recurrent left pleural effusion.  Cytology on 05/23/2019 was negative for malignancy.  Sample had predominant small lymphocytes, few neutrophils, macrophages and eosinophils.  Flow cytometry revealed no phenotypically normal T lymphocytes comprising 98% of the cells.  There is a very small population of neoplastic B cells present, < 1% that were CD5+, CD20 23+, CD43+ and consistent with a prior diagnosis of CLL.  It seems unlikely that the small population of cells was causing the recurrent effusion.     11/20/2019 CT chest angiogram revealed no evidence of pulmonary embolus.  There was a small left pleural effusion with associated pleural thickening and passive atelectasis in the left lower lobe.  Today patient reports feeling well.  Appetite is fair.  Fatigue.  Chronic shortness of breath.  # Left sided pleural effusion-  Etiology was secondary to cardiopulmonary issues. Thoracentesis on 05/23/2019 revealed a very small population of neoplastic B cells likely due to contamination from blood (fluid bloody).     INTERVAL HISTORY ALYX GEE is a 88 y.o. male who has above history reviewed by me today presents for follow up visit for CLL Patient is here by himself.  Patient recently placed his wife to a nursing facility.  He reports doing well today.  Denies any constitutional symptoms.  Past Medical History:  Diagnosis Date   Bladder neck obstruction    Cataracts, bilateral    Chronic lymphocytic leukemia (CLL), B-cell (HCC)    Lambda positive B-cell CLL   Gout    Hypercholesterolemia    Hypertension    Lymphocytosis    Sleep apnea    Thrombocytopenia (  [place])     Past Surgical History:  Procedure Laterality Date   COLONOSCOPY WITH PROPOFOL N/A 09/10/2015   Procedure: COLONOSCOPY WITH PROPOFOL;  Surgeon: Lucilla Lame, MD;  Location: [facility] ENDOSCOPY;  Service: Endoscopy;  Laterality: N/A;    PROSTATE SURGERY  2008    Family History  Problem Relation Age of Onset   Lung cancer Mother 51       deceased   Brain cancer Son 78       pt's only son   Polycythemia Brother        half brother   Prostate cancer Brother     Social History:  reports that he has never smoked. He has never used smokeless tobacco. He reports that he does not drink alcohol and does not use drugs. He reports that he does not drink alcohol or use drugs.  he reports he has never smoked. He denies any known exposure to radiation or toxins. He spent several years working in [employer] His wife has dementia.  Allergies:  Allergies  Allergen Reactions   Atorvastatin     Other reaction(s): Muscle pain   Penicillins Other (See Comments)    Has patient had a PCN reaction causing immediate rash, facial/tongue/throat swelling, SOB or lightheadedness with hypotension: Unknown Has patient had a PCN reaction causing severe rash involving mucus membranes or skin necrosis: Unknown Has patient had a PCN reaction that required hospitalization: Unknown Has patient had a PCN reaction occurring within the last 10 years: Unknown If all of the above answers are "NO", then may proceed with Cephalosporin use.   Terazosin     Other reaction(s): Feeling faint    Current Medications: Current Outpatient Medications  Medication Sig Dispense Refill   amLODipine (NORVASC) 2.5 MG tablet Take 2.5 mg by mouth daily.     aspirin 81 MG EC tablet Take 1 tablet by mouth daily.     carvedilol (COREG) 3.125 MG tablet Take 1 tablet by mouth every 12 (twelve) hours.     ezetimibe (ZETIA) 10 MG tablet TAKE (1) TABLET BY MOUTH EVERY DAY 14 tablet 0   furosemide (LASIX) 40 MG tablet Take 1 tablet (40 mg total) by mouth daily. kowalski 30 tablet 0   lisinopril (ZESTRIL) 20 MG tablet TAKE (1) TABLET BY MOUTH DAILY. 30 tablet 0   tamsulosin (FLOMAX) 0.4 MG CAPS capsule TAKE (1) CAPSULE BY MOUTH EVERY DAY 90 capsule 0   nitroGLYCERIN  (NITROSTAT) 0.4 MG SL tablet Place 1 tablet (0.4 mg total) under the tongue every 5 (five) minutes as needed for chest pain. (Patient not taking: Reported on 10/17/2021) 15 tablet 0   No current facility-administered medications for this visit.    Review of Systems  Constitutional:  Positive for malaise/fatigue. Negative for chills, diaphoresis, fever and weight loss.       Feels "better than usual."  HENT: Negative.  Negative for congestion, ear discharge, ear pain, hearing loss, nosebleeds, sinus pain, sore throat and tinnitus.   Eyes: Negative.  Negative for blurred vision and double vision.  Respiratory:  Positive for shortness of breath (with exertion, improved). Negative for cough, hemoptysis and sputum production.        Uses CPAP for sleep apnea  Cardiovascular:  Negative for chest pain, palpitations, orthopnea, claudication and leg swelling.       Pacemaker.  Gastrointestinal: Negative.  Negative for abdominal pain, blood in stool, constipation, diarrhea, heartburn, melena, nausea and vomiting.  Genitourinary: Negative.  Negative for dysuria, frequency, hematuria and  urgency.  Musculoskeletal: Negative.  Negative for back pain, joint pain and myalgias.  Skin: Negative.  Negative for itching and rash.  Neurological: Negative.  Negative for dizziness, tingling, sensory change, weakness and headaches.  Endo/Heme/Allergies: Negative.  Does not bruise/bleed easily.  Psychiatric/Behavioral: Negative.  Negative for depression and memory loss. The patient is not nervous/anxious and does not have insomnia.   All other systems reviewed and are negative.  Performance status (ECOG): 1-2  Vitals Blood pressure (!) 154/82, pulse 79, temperature 97.6 F (36.4 C), resp. rate 18, weight 230 lb 4.8 oz (104.5 kg).   Physical Exam Vitals and nursing note reviewed.  Constitutional:      General: He is not in acute distress.    Appearance: He is obese. He is not diaphoretic.  HENT:     Head:  Normocephalic and atraumatic.     Mouth/Throat:     Mouth: Mucous membranes are moist.     Pharynx: Oropharynx is clear.  Eyes:     General: No scleral icterus.    Pupils: Pupils are equal, round, and reactive to light.  Cardiovascular:     Rate and Rhythm: Normal rate and regular rhythm.     Pulses: Normal pulses.     Heart sounds: Normal heart sounds. No murmur heard. Pulmonary:     Effort: Pulmonary effort is normal. No respiratory distress.     Breath sounds: Normal breath sounds. No wheezing or rales.  Abdominal:     General: Bowel sounds are normal. There is no distension.     Palpations: Abdomen is soft.  Musculoskeletal:        General: No swelling or tenderness. Normal range of motion.     Cervical back: Normal range of motion and neck supple.     Right lower leg: Edema (1-2+) present.     Left lower leg: Edema (1-2+) present.  Lymphadenopathy:     Head:     Right side of head: No preauricular, posterior auricular or occipital adenopathy.     Left side of head: No preauricular, posterior auricular or occipital adenopathy.     Cervical: No cervical adenopathy.     Upper Body:     Right upper body: No supraclavicular or axillary adenopathy.     Left upper body: No supraclavicular or axillary adenopathy.     Lower Body: No right inguinal adenopathy. No left inguinal adenopathy.  Skin:    General: Skin is warm and dry.  Neurological:     Mental Status: He is alert and oriented to person, place, and time.     Comments: Hearing loss  Psychiatric:        Mood and Affect: Mood normal.   Appointment on 01/05/2022  Component Date Value Ref Range Status   LDH 01/05/2022 154  98 - 192 U/L Final   Performed at [facility], [address] [place], [place] [address]   Sodium 01/05/2022 136  135 - 145 mmol/L Final   Potassium 01/05/2022 4.5  3.5 - 5.1 mmol/L Final   Chloride 01/05/2022 102  98 - 111 mmol/L Final   CO2 01/05/2022 25  22 - 32 mmol/L Final   Glucose, Bld  01/05/2022 108 (H)  70 - 99 mg/dL Final   Glucose reference range applies only to samples taken after fasting for at least 8 hours.   BUN 01/05/2022 16  8 - 23 mg/dL Final   Creatinine, Ser 01/05/2022 1.34 (H)  0.61 - 1.24 mg/dL Final   Calcium  01/05/2022 8.8 (L)  8.9 - 10.3 mg/dL Final   Total Protein 01/05/2022 6.7  6.5 - 8.1 g/dL Final   Albumin 01/05/2022 4.4  3.5 - 5.0 g/dL Final   AST 01/05/2022 21  15 - 41 U/L Final   ALT 01/05/2022 14  0 - 44 U/L Final   Alkaline Phosphatase 01/05/2022 40  38 - 126 U/L Final   Total Bilirubin 01/05/2022 1.3 (H)  0.3 - 1.2 mg/dL Final   GFR, Estimated 01/05/2022 51 (L)  >60 mL/min Final   Comment: (NOTE) Calculated using the CKD-EPI Creatinine Equation (2021)    Anion gap 01/05/2022 9  5 - 15 Final   Performed at [facility], [place], [place] [address]   WBC 01/05/2022 15.0 (H)  4.0 - 10.5 K/uL Final   RBC 01/05/2022 5.58  4.22 - 5.81 MIL/uL Final   Hemoglobin 01/05/2022 16.7  13.0 - 17.0 g/dL Final   HCT 01/05/2022 50.8  39.0 - 52.0 % Final   MCV 01/05/2022 91.0  80.0 - 100.0 fL Final   MCH 01/05/2022 29.9  26.0 - 34.0 pg Final   MCHC 01/05/2022 32.9  30.0 - 36.0 g/dL Final   RDW 01/05/2022 13.8  11.5 - 15.5 % Final   Platelets 01/05/2022 153  150 - 400 K/uL Final   nRBC 01/05/2022 0.0  0.0 - 0.2 % Final   Neutrophils Relative % 01/05/2022 41  % Final   Neutro Abs 01/05/2022 6.1  1.7 - 7.7 K/uL Final   Lymphocytes Relative 01/05/2022 47  % Final   Lymphs Abs 01/05/2022 7.1 (H)  0.7 - 4.0 K/uL Final   Monocytes Relative 01/05/2022 9  % Final   Monocytes Absolute 01/05/2022 1.4 (H)  0.1 - 1.0 K/uL Final   Eosinophils Relative 01/05/2022 2  % Final   Eosinophils Absolute 01/05/2022 0.2  0.0 - 0.5 K/uL Final   Basophils Relative 01/05/2022 1  % Final   Basophils Absolute 01/05/2022 0.1  0.0 - 0.1 K/uL Final   WBC Morphology 01/05/2022 SMUDGE CELLS   Final   DIFF. CONFIRMED BY SMEAR   RBC Morphology 01/05/2022  MORPHOLOGY UNREMARKABLE   Final   Smear Review 01/05/2022 Normal platelet morphology   Final   PLATELETS APPEAR ADEQUATE   Immature Granulocytes 01/05/2022 0  % Final   Abs Immature Granulocytes 01/05/2022 0.04  0.00 - 0.07 K/uL Final   Performed at [facility], [address] [place], [place] [address]  Orders Only on 01/05/2022  Component Date Value Ref Range Status   PATH INTERP XXX-IMP 01/05/2022 Comment   Final   Comment: (NOTE) Chronic lymphocytic leukemia, B cell, CD38 negative and CD20 positive, see comment    ANNOTATION COMMENT IMP 01/05/2022 Comment   Corrected   Comment: (NOTE) Recommend correlation with the clinical findings, CLL FISH studies and IGVH mutation status, if available, and follow up as appropriate.    CLINICAL INFO 01/05/2022 Comment   Corrected   Comment: (NOTE) Accompanying CBC dated 01/05/2022 shows: WBC count 15.0, Neu 6.1, Lym 7.1, Mon 1.4.    Specimen Type 01/05/2022 Comment   Final   Peripheral blood   ASSESSMENT OF LEUKOCYTES 01/05/2022 Comment   Final   Comment: (NOTE) A CD5+, CD23+, CD38- (<1%) clonal B cell population is detected with dim lambda light chain restriction, representing >99% of the B cells and 40% of the leukocytes. The phenotype is typical of chronic lymphocytic leukemia (CLL) /small lymphocytic lymphoma (SLL). There is no loss of,  or aberrant expression of, the pan T cell antigens to suggest a neoplastic T cell process. CD4:CD8 ratio 1.0 No circulating blasts are detected. There is no immunophenotypic evidence of abnormal myeloid maturation. Analysis of the leukocyte population shows: granulocytes 44%, monocytes 3%, lymphocytes 53%, blasts <0.1%, B cells 40%, T cells 12%, NK cells 1%.    % Viable Cells 01/05/2022 Comment   Corrected   94%   Immunophenotypic Profile 01/05/2022 40% of total cells (Phenotype below)   Corrected   Comment: Comment Abnormal cell population: present    ANALYSIS AND GATING  STRATEGY 01/05/2022 Comment   Final   8 color analysis with CD45/SSC gating   IMMUNOPHENOTYPING STUDY 01/05/2022 Comment   Final   Comment: (NOTE) CD2       (-)            CD3       (-) CD4       (-)            CD5       (+) CD7       (-)            CD8       (-) CD10      (-)            CD11b     (-) CD11c     (+)            CD13      (-) CD14      (-)            CD15      (-) CD16      (-)            CD19      (+) CD20      (+) Dim        CD22      (+) Moderate CD23      (+)            CD33      (-) CD34      (-)            CD38      (-) CD45      (+)            CD56      (-) CD57      (-)            CD103     (-) CD117     (-)            FMC-7     (-) HLA-DR    (+)            KAPPA     (-) LAMBDA    (+) Dim        CD64      (-)    PATHOLOGIST NAME 01/05/2022 Comment   Final   Lovett Sox, M.D.   COMMENT: 01/05/2022 Comment   Corrected   Comment: (NOTE) Each antibody in this assay was utilized to assess for potential abnormalities of studied cell populations or to characterize identified abnormalities. This test was developed and its performance characteristics determined by [facility].  It has not been cleared or approved by the U.S. Food and Drug Administration. The FDA has determined that such clearance or approval is not necessary. This test is used for clinical purposes.  It should not be regarded as investigational or for research. Performed At: [facility]  [address] [place], [place] [id number] Katina Degree MDPhD FE:[id number] Performed At: [facility] [address] [place], [place] [id number] Katina Degree MDPhD [id number]     Assessment/plan 1. CLL (chronic lymphocytic leukemia) (HCC)   2. 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 Physical examination showed no palpable adenopathy  Labs reviewed and discussed with patient. Patient's white count  gradually increase this.  Currently at 6. Peripheral blood flow cytometry is positive for CLL, CD38 negative and CD20 positive. Currently he has no constitutional symptoms. Discussed with patient that we will continue watchful waiting.  Leukocytosis itself is not a trigger of treatment.  #Chronic kidney disease, , continue to monitor avoid nephrotoxin.  Encourage oral hydration  RTC in 4 months for MD assessment and labs   I discussed the assessment and treatment plan with the patient.  The patient was provided an opportunity to ask questions and all were answered.  The patient agreed with the plan and demonstrated an understanding of the instructions.  The patient was advised to call back if the symptoms worsen or if the condition fails to improve as anticipated.   Earlie Server, MD, PhD 01/07/2022

## 2022-01-07 NOTE — Progress Notes (Signed)
Pt here for follow up. No new concerns voiced.   

## 2022-01-10 ENCOUNTER — Other Ambulatory Visit: Payer: Self-pay

## 2022-01-10 ENCOUNTER — Ambulatory Visit
Admission: EM | Admit: 2022-01-10 | Discharge: 2022-01-10 | Disposition: A | Discharge status: Home / Self Care | Payer: Medicare Other | Attending: Emergency Medicine | Admitting: Emergency Medicine

## 2022-01-10 DIAGNOSIS — Z23 Encounter for immunization: Secondary | ICD-10-CM | POA: Diagnosis not present

## 2022-01-10 DIAGNOSIS — L03116 Cellulitis of left lower limb: Secondary | ICD-10-CM | POA: Diagnosis not present

## 2022-01-10 DIAGNOSIS — S81812A Laceration without foreign body, left lower leg, initial encounter: Secondary | ICD-10-CM

## 2022-01-10 MED ORDER — DOXYCYCLINE HYCLATE 100 MG PO CAPS: 100.0000 mg | ORAL_CAPSULE | Freq: Two times a day (BID) | ORAL | 0 refills | Status: DC

## 2022-01-10 MED ORDER — TETANUS-DIPHTHERIA TOXOIDS TD 5-2 LFU IM INJ
0.5000 mL | INJECTION | Freq: Once | INTRAMUSCULAR | Status: AC
  Administered 2022-01-10: 0.5 mL via INTRAMUSCULAR

## 2022-01-10 NOTE — ED Provider Notes (Signed)
[facility]    CSN: [id number] Arrival date & time: 01/10/22  0815      History   Chief Complaint Chief Complaint  Patient presents with   Extremity Laceration    HPI Brent Hoover is a 88 y.o. male.   HPI  88 year old male here for evaluation of leg injury.  Patient reports that 2 days ago he tripped and fell onto a wooden step injuring his left shin.  He states that he had not looked at the wound until this morning and then he noticed that he had a large abrasion with bruising and swelling surrounding the wound and incorporating his left lower leg.  He states that it was quite sore this morning but the soreness improved when he got up and ambulated.  He states he is able to ambulate without pain.  He denies any drainage from the wound and he denies any fevers.  Patient does have a significant past medical history to include CLL, splenomegaly, hyponatremia, chronic kidney disease, atrial fibrillation, CHF, colon cancer, aortic stenosis, tricuspid and mitral regurgitation, obesity, and obstructive sleep apnea.  Past Medical History:  Diagnosis Date   Bladder neck obstruction    Cataracts, bilateral    Chronic lymphocytic leukemia (CLL), B-cell (HCC)    Lambda positive B-cell CLL   Gout    Hypercholesterolemia    Hypertension    Lymphocytosis    Sleep apnea    Thrombocytopenia ([place])     Patient Active Problem List   Diagnosis Date Noted   Obstructive sleep apnea (adult) (pediatric) 04/17/2021   OSA (obstructive sleep apnea) 04/17/2021   Psychosexual dysfunction with inhibited sexual excitement 04/17/2021   Reason for consultation 04/17/2021   Thoracic aortic aneurysm without rupture 04/17/2021   Mild aortic stenosis 02/05/2021   Moderate tricuspid regurgitation 02/05/2021   Presence of permanent cardiac pacemaker 02/05/2021   Benign neoplasm of colon 01/29/2021   Elevated prostate specific antigen (PSA) 01/29/2021   Gout 01/29/2021   Hypertrophy of  prostate without urinary obstruction and other lower urinary tract symptoms (LUTS) 01/29/2021   Obesity 01/29/2021   Other and unspecified accidental falls on same level from collision, pushing, or shoving, by or with other person 01/29/2021   Other heart block 01/29/2021   Other malaise and fatigue 01/29/2021   Shoulder joint pain 01/29/2021   Vitamin D deficiency 01/29/2021   Non-rheumatic mitral regurgitation 01/20/2021   LVH (left ventricular hypertrophy) due to hypertensive disease, without heart failure 06/05/2020   Chest pain 11/21/2019   Chest discomfort 46/96/2952   Chronic systolic CHF (congestive heart failure) ([place]) 11/20/2019   Atherosclerosis of aorta (HCC) 04/06/2019   Chronic atrial fibrillation ([place]) 04/06/2019   Thrombocytopenia, unspecified ([place]) 04/06/2019   Shortness of breath    HLD (hyperlipidemia) 03/31/2019   HTN (hypertension) 03/31/2019   Pleural effusion 03/31/2019   CKD (chronic kidney disease), stage III 03/31/2019   Elevated troponin 03/31/2019   Hyponatremia 05/20/2018   Gastro-esophageal reflux disease without esophagitis 10/19/2017   Symptomatic bradycardia 10/19/2017   Splenomegaly 07/27/2017   CLL (chronic lymphocytic leukemia) ([place]) 04/30/2015    Past Surgical History:  Procedure Laterality Date   COLONOSCOPY WITH PROPOFOL N/A 09/10/2015   Procedure: COLONOSCOPY WITH PROPOFOL;  Surgeon: Lucilla Lame, MD;  Location: [facility] ENDOSCOPY;  Service: Endoscopy;  Laterality: N/A;   PROSTATE SURGERY  2008       Home Medications    Prior to Admission medications   Medication Sig Start Date End Date Taking? Authorizing Provider  doxycycline (VIBRAMYCIN) 100 MG capsule Take 1 capsule (100 mg total) by mouth 2 (two) times daily. 01/10/22  Yes Margarette Canada, NP  amLODipine (NORVASC) 2.5 MG tablet Take 2.5 mg by mouth daily.    [provider]  aspirin 81 MG EC tablet Take 1 tablet by mouth daily. 02/07/20   [provider]  carvedilol  (COREG) 3.125 MG tablet Take 1 tablet by mouth every 12 (twelve) hours. 02/07/20   [provider]  ezetimibe (ZETIA) 10 MG tablet TAKE (1) TABLET BY MOUTH EVERY DAY 01/22/21   Juline Patch, MD  furosemide (LASIX) 40 MG tablet Take 1 tablet (40 mg total) by mouth daily. [email or web address] 11/21/19 01/07/22  Thornell Mule, MD  lisinopril (ZESTRIL) 20 MG tablet TAKE (1) TABLET BY MOUTH DAILY. 11/21/19   Thornell Mule, MD  nitroGLYCERIN (NITROSTAT) 0.4 MG SL tablet Place 1 tablet (0.4 mg total) under the tongue every 5 (five) minutes as needed for chest pain. Patient not taking: Reported on 10/17/2021 11/21/19   Thornell Mule, MD  tamsulosin (FLOMAX) 0.4 MG CAPS capsule TAKE (1) CAPSULE BY MOUTH EVERY DAY 01/06/21   Juline Patch, MD    Family History Family History  Problem Relation Age of Onset   Lung cancer Mother 59       deceased   Brain cancer Son 8       pt's only son   Polycythemia Brother        half brother   Prostate cancer Brother     Social History Social History   Tobacco Use   Smoking status: Never   Smokeless tobacco: Never  Vaping Use   Vaping Use: Never used  Substance Use Topics   Alcohol use: No    Alcohol/week: 0.0 standard drinks   Drug use: No     Allergies   Penicillins, Atorvastatin, and Terazosin   Review of Systems Review of Systems  Constitutional:  Negative for fever.  Skin:  Positive for color change and wound.  Hematological: Negative.   Psychiatric/Behavioral: Negative.      Physical Exam Triage Vital Signs ED Triage Vitals  Enc Vitals Group     BP 01/10/22 0833 133/79     Pulse Rate 01/10/22 0833 72     Resp 01/10/22 0833 18     Temp 01/10/22 0833 97.7 F (36.5 C)     Temp Source 01/10/22 0833 Oral     SpO2 01/10/22 0833 100 %     Weight --      Height --      Head Circumference --      Peak Flow --      Pain Score 01/10/22 0834 2     Pain Loc --      Pain Edu? --      Excl. in [place]? --    No data found.  Updated Vital  Signs BP 133/79 (BP Location: Left Arm)    Pulse 72    Temp 97.7 F (36.5 C) (Oral)    Resp 18    SpO2 100%   Visual Acuity Right Eye Distance:   Left Eye Distance:   Bilateral Distance:    Right Eye Near:   Left Eye Near:    Bilateral Near:     Physical Exam Vitals and nursing note reviewed.  Constitutional:      Appearance: Normal appearance. He is not ill-appearing.  HENT:     Head: Normocephalic and atraumatic.  Musculoskeletal:  General: Swelling, tenderness and signs of injury present. Normal range of motion.  Skin:    General: Skin is warm and dry.     Capillary Refill: Capillary refill takes less than 2 seconds.     Findings: Bruising and erythema present.  Neurological:     General: No focal deficit present.     Mental Status: He is alert and oriented to person, place, and time.  Psychiatric:        Mood and Affect: Mood normal.        Behavior: Behavior normal.        Thought Content: Thought content normal.        Judgment: Judgment normal.     UC Treatments / Results  Labs (all labs ordered are listed, but only abnormal results are displayed) Labs Reviewed - No data to display  EKG   Radiology No results found.  Procedures Procedures (including critical care time)  Medications Ordered in UC Medications  tetanus & diphtheria toxoids (adult) (TENIVAC) injection 0.5 mL (has no administration in time range)    Initial Impression / Assessment and Plan / UC Course  I have reviewed the triage vital signs and the nursing notes.  Pertinent labs & imaging results that were available during my care of the patient were reviewed by me and considered in my medical decision making (see chart for details).  Patient is a nontoxic-appearing 88 year old male with a significant past medical history who presents for evaluation of a leg wounds he sustained to his distal left shin 2 days ago.  He states that he tripped and fell on a wooden step.  He states that  he did not initially look at the injury and only came in today because he was in pain.  He states he has been applying some first-aid cream but otherwise has not been tending to the wound in any way.  He states that the pain in his leg dissipated when he got up and started ambulating this morning.  He is able to ambulate without difficulty.  On exam patient has edematous left lower extremity with a 5-1/2 cm by half centimeter scabbed wound horizontally across the anterior shin on the distal aspect of the left lower leg.  The surrounding tissue is mildly erythematous and there is significant ecchymosis.  Patient has no tenderness in his calf and has a negative Homans' sign.  His DP and PT pulses are 2+.  I advised the patient that at this point his wound will have to heal on its own as it is too late to attempt closure.  There is no active bleeding or drainage from the wound.  We will have the staff clean the wound and apply a nonadherent dressing with some bacitracin.  I will discharge the patient home on doxycycline twice daily for 10 days.  Patient does have a history of CKD and his most recent CMP is from 01/05/2022 which shows a GFR 51.  According to up-to-date there is no need to do dosage adjustment based on patient's renal function.  I have also advised the patient that he needs to be mindful of the wound, clean it daily, and return if he has any increasing swelling, redness, red streaks ascending his leg, drainage from the wound, or fever.   Final Clinical Impressions(s) / UC Diagnoses   Final diagnoses:  Laceration of left lower extremity, initial encounter  Cellulitis of left lower extremity     Discharge Instructions  Wash your wound daily with warm soap and water and pat it dry.  You can leave the wound open to air when you are at home and cover it with a dry, nonstick dressing when you go out in public.  I am going to prescribe you doxycycline to take twice daily with food to help  prevent infection.  You can use over-the-counter Tylenol as needed for pain.  Keep your left leg elevated is much as possible to help decrease the swelling and aid in healing.  If you have any increased swelling of your lower leg, increased redness, develop drainage from the wound, develop red streaks going up your leg, swelling in your groin, or fever please return for reevaluation or go to the ER.     ED Prescriptions     Medication Sig Dispense Auth. Provider   doxycycline (VIBRAMYCIN) 100 MG capsule Take 1 capsule (100 mg total) by mouth 2 (two) times daily. 20 capsule Margarette Canada, NP      PDMP not reviewed this encounter.   Margarette Canada, NP 01/10/22 [phone]

## 2022-01-10 NOTE — ED Triage Notes (Signed)
Pr present a fall two days ago and injured his left leg. Pt has a abrasion to the left leg, discoloration to skin with swelling and tightness.  ?

## 2022-01-10 NOTE — Discharge Instructions (Addendum)
Wash your wound daily with warm soap and water and pat it dry.  You can leave the wound open to air when you are at home and cover it with a dry, nonstick dressing when you go out in public. ? ?I am going to prescribe you doxycycline to take twice daily with food to help prevent infection. ? ?You can use over-the-counter Tylenol as needed for pain. ? ?Keep your left leg elevated is much as possible to help decrease the swelling and aid in healing. ? ?If you have any increased swelling of your lower leg, increased redness, develop drainage from the wound, develop red streaks going up your leg, swelling in your groin, or fever please return for reevaluation or go to the ER. ?

## 2022-01-12 ENCOUNTER — Other Ambulatory Visit: Payer: Medicare Other

## 2022-01-15 ENCOUNTER — Ambulatory Visit: Payer: Medicare Other | Admitting: Oncology

## 2022-01-23 ENCOUNTER — Other Ambulatory Visit: Payer: Self-pay

## 2022-01-23 ENCOUNTER — Ambulatory Visit
Admission: EM | Admit: 2022-01-23 | Discharge: 2022-01-23 | Disposition: A | Discharge status: Home / Self Care | Payer: Medicare Other | Attending: Internal Medicine | Admitting: Internal Medicine

## 2022-01-23 DIAGNOSIS — S91002D Unspecified open wound, left ankle, subsequent encounter: Secondary | ICD-10-CM | POA: Diagnosis not present

## 2022-01-23 MED ORDER — BACITRACIN ZINC 500 UNIT/GM EX OINT: 1.0000 "application " | TOPICAL_OINTMENT | Freq: Two times a day (BID) | CUTANEOUS | 0 refills | Status: DC

## 2022-01-23 NOTE — ED Triage Notes (Signed)
Patient is here for "Follow up to wound on left ankle". Still swelling, redness with a lot of pain. No fever. ?

## 2022-01-23 NOTE — ED Provider Notes (Signed)
?[facility] ? ? ? ?CSN: [id number] ?Arrival date & time: 01/23/22  0910 ? ? ?  ? ?History   ?Chief Complaint ?Chief Complaint  ?Patient presents with  ? Wound Check  ? ? ?HPI ?Brent Hoover is a 88 y.o. male comes to the urgent care for wound check.  Patient was seen in the urgent care 2 weeks ago for left ankle wound.  He was prescribed antibiotics at that time.  Patient sustained a laceration after he had a mechanical fall.  He has completed a course of antibiotics.  The wound on the left ankle has stable scab over it.  Erythema is confined to the edges of the wound.  There is no fluctuance.  No fever or chills.  No discharge from the wound.  Patient has bruises on the left cough as well as the left foot.  Both bruises are not new. ? ?HPI ? ?Past Medical History:  ?Diagnosis Date  ? Bladder neck obstruction   ? Cataracts, bilateral   ? Chronic lymphocytic leukemia (CLL), B-cell ([place])   ? Lambda positive B-cell CLL  ? Gout   ? Hypercholesterolemia   ? Hypertension   ? Lymphocytosis   ? Sleep apnea   ? Thrombocytopenia ([place])   ? ? ?Patient Active Problem List  ? Diagnosis Date Noted  ? Obstructive sleep apnea (adult) (pediatric) 04/17/2021  ? OSA (obstructive sleep apnea) 04/17/2021  ? Mild aortic stenosis 02/05/2021  ? Moderate tricuspid regurgitation 02/05/2021  ? Presence of permanent cardiac pacemaker 02/05/2021  ? Benign neoplasm of colon 01/29/2021  ? Elevated prostate specific antigen (PSA) 01/29/2021  ? Gout 01/29/2021  ? Hypertrophy of prostate without urinary obstruction and other lower urinary tract symptoms (LUTS) 01/29/2021  ? Obesity 01/29/2021  ? Other and unspecified accidental falls on same level from collision, pushing, or shoving, by or with other person 01/29/2021  ? Other heart block 01/29/2021  ? Other malaise and fatigue 01/29/2021  ? Shoulder joint pain 01/29/2021  ? Psychosexual dysfunction with inhibited sexual excitement 01/29/2021  ? Reason for consultation 01/29/2021  ?  Vitamin D deficiency 01/29/2021  ? Non-rheumatic mitral regurgitation 01/20/2021  ? LVH (left ventricular hypertrophy) due to hypertensive disease, without heart failure 06/05/2020  ? Thoracic ascending aortic aneurysm 12/06/2019  ? Chest pain 11/21/2019  ? Chest discomfort 11/20/2019  ? Chronic systolic CHF (congestive heart failure) ([place]) 11/20/2019  ? Atherosclerosis of aorta ([place]) 04/06/2019  ? Chronic atrial fibrillation ([place]) 04/06/2019  ? Thrombocytopenia, unspecified ([place]) 04/06/2019  ? Shortness of breath   ? HLD (hyperlipidemia) 03/31/2019  ? HTN (hypertension) 03/31/2019  ? Pleural effusion 03/31/2019  ? CKD (chronic kidney disease), stage III 03/31/2019  ? Elevated troponin 03/31/2019  ? Hyponatremia 05/20/2018  ? Gastro-esophageal reflux disease without esophagitis 10/19/2017  ? Symptomatic bradycardia 10/19/2017  ? Splenomegaly 07/27/2017  ? CLL (chronic lymphocytic leukemia) ([place]) 04/30/2015  ? ? ?Past Surgical History:  ?Procedure Laterality Date  ? COLONOSCOPY WITH PROPOFOL N/A 09/10/2015  ? Procedure: COLONOSCOPY WITH PROPOFOL;  Surgeon: Lucilla Lame, MD;  Location: [facility] END[facility];  Service: Endoscopy;  Laterality: N/A;  ? PROSTATE SURGERY  2008  ? ? ? ? ? ?Home Medications   ? ?Prior to Admission medications   ?Medication Sig Start Date End Date Taking? Authorizing Provider  ?amLODipine (NORVASC) 2.5 MG tablet Take 2.5 mg by mouth daily.   Yes [provider]  ?aspirin 81 MG EC tablet Take 1 tablet by mouth daily. 02/07/20  Yes [provider]  ?bacitracin ointment Apply 1 application. topically 2 (two) times daily. 01/23/22  Yes Gable Odonohue, Myrene Galas, MD  ?carvedilol (COREG) 3.125 MG tablet Take 1 tablet by mouth every 12 (twelve) hours. 02/07/20  Yes [provider]  ?ezetimibe (ZETIA) 10 MG tablet TAKE (1) TABLET BY MOUTH EVERY DAY 01/22/21  Yes Juline Patch, MD  ?lisinopril (ZESTRIL) 20 MG tablet TAKE (1) TABLET BY MOUTH DAILY. 11/21/19  Yes Thornell Mule, MD  ?tamsulosin  (FLOMAX) 0.4 MG CAPS capsule TAKE (1) CAPSULE BY MOUTH EVERY DAY 01/06/21  Yes Juline Patch, MD  ?doxycycline (VIBRAMYCIN) 100 MG capsule Take 1 capsule (100 mg total) by mouth 2 (two) times daily. 01/10/22   Margarette Canada, NP  ?furosemide (LASIX) 40 MG tablet Take 1 tablet (40 mg total) by mouth daily. Nehemiah Massed 11/21/19 01/07/22  Thornell Mule, MD  ?nitroGLYCERIN (NITROSTAT) 0.4 MG SL tablet Place 1 tablet (0.4 mg total) under the tongue every 5 (five) minutes as needed for chest pain. 11/21/19   Thornell Mule, MD  ? ? ?Family History ?Family History  ?Problem Relation Age of Onset  ? Lung cancer Mother 71  ?     deceased  ? Brain cancer Son 85  ?     pt's only son  ? Polycythemia Brother   ?     half brother  ? Prostate cancer Brother   ? ? ?Social History ?Social History  ? ?Tobacco Use  ? Smoking status: Never  ? Smokeless tobacco: Never  ?Vaping Use  ? Vaping Use: Never used  ?Substance Use Topics  ? Alcohol use: No  ?  Alcohol/week: 0.0 standard drinks  ? Drug use: No  ? ? ? ?Allergies   ?Penicillins, Atorvastatin, and Terazosin ? ? ?Review of Systems ?Review of Systems  ?Respiratory: Negative.    ?Gastrointestinal: Negative.   ?Genitourinary: Negative.   ?Musculoskeletal: Negative.   ?Skin:  Positive for color change and wound. Negative for pallor and rash.  ?Neurological: Negative.   ? ? ?Physical Exam ?Triage Vital Signs ?ED Triage Vitals  ?Enc Vitals Group  ?   BP 01/23/22 0928 132/68  ?   Pulse Rate 01/23/22 0928 69  ?   Resp 01/23/22 0928 (!) 22  ?   Temp 01/23/22 0928 98 ?F (36.7 ?C)  ?   Temp Source 01/23/22 0928 Oral  ?   SpO2 --   ?   Weight 01/23/22 0926 230 lb 6.1 oz (104.5 kg)  ?   Height 01/23/22 0926 '5\' 7"'$  (1.702 m)  ?   Head Circumference --   ?   Peak Flow --   ?   Pain Score 01/23/22 0925 2  ?   Pain Loc --   ?   Pain Edu? --   ?   Excl. in [place]? --   ? ?No data found. ? ?Updated Vital Signs ?BP 132/68 (BP Location: Left Arm)   Pulse 69   Temp 98 ?F (36.7 ?C) (Oral)   Resp (!) 22   Ht '5\' 7"'$   (1.702 m)   Wt 104.5 kg   BMI 36.08 kg/m?  ? ?Visual Acuity ?Right Eye Distance:   ?Left Eye Distance:   ?Bilateral Distance:   ? ?Right Eye Near:   ?Left Eye Near:    ?Bilateral Near:    ? ?Physical Exam ?Vitals and nursing note reviewed.  ?Constitutional:   ?   General: He is not in acute distress. ?   Appearance: He is not ill-appearing.  ?  Cardiovascular:  ?   Rate and Rhythm: Normal rate and regular rhythm.  ?Musculoskeletal:     ?   General: Normal range of motion.  ?Skin: ?   Comments: 3 inch laceration over the lateral aspect of the left lower leg.  Stable scab noted over the laceration.  No discharge from the wound.  No fluctuance noted.  Erythema is confined to the edges of the healing wound.  ?Neurological:  ?   Mental Status: He is alert.  ? ? ? ?UC Treatments / Results  ?Labs ?(all labs ordered are listed, but only abnormal results are displayed) ?Labs Reviewed - No data to display ? ?EKG ? ? ?Radiology ?No results found. ? ?Procedures ?Procedures (including critical care time) ? ?Medications Ordered in UC ?Medications - No data to display ? ?Initial Impression / Assessment and Plan / UC Course  ?I have reviewed the triage vital signs and the nursing notes. ? ?Pertinent labs & imaging results that were available during my care of the patient were reviewed by me and considered in my medical decision making (see chart for details). ? ?  ? ?1.  Left ankle wound: ?Continue nonocclusive dressing with topical antibiotic ointment on a daily basis ?No signs of infection currently ?Reassurance given ?Elevation of left leg because of mild pedal edema-unilateral ?Return to urgent care if symptoms worsen or if you notice redness, purulent discharge, worsening swelling and/or pain. ?Final Clinical Impressions(s) / UC Diagnoses  ? ?Final diagnoses:  ?Ankle wound, left, subsequent encounter  ? ? ? ?Discharge Instructions   ? ?  ?Please continue nonocclusive dressing with antibiotic ointment daily ?He had no signs of  infection. ? ? ?ED Prescriptions   ? ? Medication Sig Dispense Auth. Provider  ? bacitracin ointment Apply 1 application. topically 2 (two) times daily. 120 g Chase Picket, MD  ? ?  ? ?PDMP not reviewed this en

## 2022-01-23 NOTE — Discharge Instructions (Addendum)
Please continue nonocclusive dressing with antibiotic ointment daily ?Your wound shows no signs of infection. ?Please elevate your legs when you are sitting or sleeping in bed. ?If you notice worsening redness, purulent discharge, worsening pain or fever please return to urgent care to be reevaluated. ?

## 2022-03-10 DIAGNOSIS — I5022 Chronic systolic (congestive) heart failure: Secondary | ICD-10-CM | POA: Diagnosis not present

## 2022-04-15 DIAGNOSIS — I350 Nonrheumatic aortic (valve) stenosis: Secondary | ICD-10-CM | POA: Diagnosis not present

## 2022-04-15 DIAGNOSIS — I7121 Aneurysm of the ascending aorta, without rupture: Secondary | ICD-10-CM | POA: Diagnosis not present

## 2022-04-15 DIAGNOSIS — G4733 Obstructive sleep apnea (adult) (pediatric): Secondary | ICD-10-CM | POA: Diagnosis not present

## 2022-04-15 DIAGNOSIS — E782 Mixed hyperlipidemia: Secondary | ICD-10-CM | POA: Diagnosis not present

## 2022-04-15 DIAGNOSIS — N1831 Chronic kidney disease, stage 3a: Secondary | ICD-10-CM | POA: Diagnosis not present

## 2022-04-15 DIAGNOSIS — I10 Essential (primary) hypertension: Secondary | ICD-10-CM | POA: Diagnosis not present

## 2022-04-15 DIAGNOSIS — I5022 Chronic systolic (congestive) heart failure: Secondary | ICD-10-CM | POA: Diagnosis not present

## 2022-04-15 DIAGNOSIS — I482 Chronic atrial fibrillation, unspecified: Secondary | ICD-10-CM | POA: Diagnosis not present

## 2022-04-15 DIAGNOSIS — Z950 Presence of cardiac pacemaker: Secondary | ICD-10-CM | POA: Diagnosis not present

## 2022-04-15 DIAGNOSIS — I4891 Unspecified atrial fibrillation: Secondary | ICD-10-CM | POA: Diagnosis not present

## 2022-04-15 DIAGNOSIS — I700 Atherosclerosis of aorta: Secondary | ICD-10-CM | POA: Diagnosis not present

## 2022-05-11 ENCOUNTER — Inpatient Hospital Stay: Payer: Medicare Other | Attending: Oncology

## 2022-05-11 ENCOUNTER — Inpatient Hospital Stay (HOSPITAL_BASED_OUTPATIENT_CLINIC_OR_DEPARTMENT_OTHER): Payer: Medicare Other | Admitting: Oncology

## 2022-05-11 ENCOUNTER — Encounter: Payer: Self-pay | Admitting: Oncology

## 2022-05-11 VITALS — BP 135/72 | HR 83 | Temp 97.9°F | Resp 18 | Wt 237.0 lb

## 2022-05-11 DIAGNOSIS — Z79899 Other long term (current) drug therapy: Secondary | ICD-10-CM | POA: Diagnosis not present

## 2022-05-11 DIAGNOSIS — Z7982 Long term (current) use of aspirin: Secondary | ICD-10-CM | POA: Diagnosis not present

## 2022-05-11 DIAGNOSIS — Z8042 Family history of malignant neoplasm of prostate: Secondary | ICD-10-CM | POA: Insufficient documentation

## 2022-05-11 DIAGNOSIS — Z808 Family history of malignant neoplasm of other organs or systems: Secondary | ICD-10-CM | POA: Diagnosis not present

## 2022-05-11 DIAGNOSIS — N1831 Chronic kidney disease, stage 3a: Secondary | ICD-10-CM | POA: Insufficient documentation

## 2022-05-11 DIAGNOSIS — Z801 Family history of malignant neoplasm of trachea, bronchus and lung: Secondary | ICD-10-CM | POA: Diagnosis not present

## 2022-05-11 DIAGNOSIS — C9110 Chronic lymphocytic leukemia of B-cell type not having achieved remission: Secondary | ICD-10-CM

## 2022-05-11 LAB — CBC WITH DIFFERENTIAL/PLATELET
Abs Immature Granulocytes: 0.06 10*3/uL (ref 0.00–0.07)
Basophils Absolute: 0.1 10*3/uL (ref 0.0–0.1)
Basophils Relative: 1 %
Eosinophils Absolute: 0.4 10*3/uL (ref 0.0–0.5)
Eosinophils Relative: 3 %
HCT: 48.9 % (ref 39.0–52.0)
Hemoglobin: 16 g/dL (ref 13.0–17.0)
Immature Granulocytes: 0 %
Lymphocytes Relative: 51 %
Lymphs Abs: 7.8 10*3/uL — ABNORMAL HIGH (ref 0.7–4.0)
MCH: 30 pg (ref 26.0–34.0)
MCHC: 32.7 g/dL (ref 30.0–36.0)
MCV: 91.6 fL (ref 80.0–100.0)
Monocytes Absolute: 1 10*3/uL (ref 0.1–1.0)
Monocytes Relative: 6 %
Neutro Abs: 6 10*3/uL (ref 1.7–7.7)
Neutrophils Relative %: 39 %
Platelets: 150 10*3/uL (ref 150–400)
RBC: 5.34 MIL/uL (ref 4.22–5.81)
RDW: 14 % (ref 11.5–15.5)
Smear Review: NORMAL
WBC: 15.3 10*3/uL — ABNORMAL HIGH (ref 4.0–10.5)
nRBC: 0 % (ref 0.0–0.2)

## 2022-05-11 LAB — COMPREHENSIVE METABOLIC PANEL
ALT: 14 U/L (ref 0–44)
AST: 18 U/L (ref 15–41)
Albumin: 3.9 g/dL (ref 3.5–5.0)
Alkaline Phosphatase: 35 U/L — ABNORMAL LOW (ref 38–126)
Anion gap: 7 (ref 5–15)
BUN: 21 mg/dL (ref 8–23)
CO2: 27 mmol/L (ref 22–32)
Calcium: 8.3 mg/dL — ABNORMAL LOW (ref 8.9–10.3)
Chloride: 99 mmol/L (ref 98–111)
Creatinine, Ser: 1.22 mg/dL (ref 0.61–1.24)
GFR, Estimated: 57 mL/min — ABNORMAL LOW (ref >60)
Glucose, Bld: 105 mg/dL — ABNORMAL HIGH (ref 70–99)
Potassium: 4.7 mmol/L (ref 3.5–5.1)
Sodium: 133 mmol/L — ABNORMAL LOW (ref 135–145)
Total Bilirubin: 0.9 mg/dL (ref 0.3–1.2)
Total Protein: 6.2 g/dL — ABNORMAL LOW (ref 6.5–8.1)

## 2022-05-11 LAB — LACTATE DEHYDROGENASE: LDH: 141 U/L (ref 98–192)

## 2022-05-11 NOTE — Progress Notes (Signed)
Hematology/Oncology Progress note Telephone:(336) [phone] Fax:(336) [phone]    Chief Complaint: Brent Hoover is a 88 y.o. male with presents for follow up of [place] is a 88 y.o.[place] who has above oncology history reviewed by me today presented for follow up visit for CLL. Patient previously followed up by Dr.Corcoran, patient switched care to me on 04/17/21 Extensive medical record review was performed by me  Chronic lymphocytic leukemia 08/06/2015, establish care with Dr. Rogue Bussing.  At that time patient was symptomatic and was on watchful waiting. 11/05/2015  Flow cytometry revealed CLL, B-cell, CD38 negative. 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 07/27/2017, presented with constitutional symptoms with 30 to 40 pounds unintentional weight loss in 6 months, night sweats.  Early satiety,LLQ abdominal discomfort.  07/29/2017 CT abdomen and pelvis reviewed splenomegaly, lymphadenopathy in the abdominal and pelvis.  Stranding along with some omental adipose tissue.  Left lower quadrant.  08/04/2017 - 01/05/2018. Gazyva (obinutuzumab) monthly x 6  03/04/2018   Abdomen and pelvis CT on  revealed improved splenomegaly (480 cc) and resolved adenopathy.  He has a developmental venous variant (double IVC).  Subsequently patient has been on surveillance every 3 months.    03/31/2019 - 04/03/2019 admission due to shortness of breath and a left sided pleuraleffusion.  Chest CT on 04/03/2019 revealed a moderate loculated left pleural effusion with no mediastinal, hilar or axillary adenopathy.  Left thoracentesis on 04/03/2019 revealed 900 cc of fluid c/w a transudate.  Cytology was negative.  There were reactive mesothelial cells and predominant lymphocytes.  The majority of the lymphocytes stained with CD5 (T-cell marker).  There was no co-expression of PAX 5 (B-cell marker), CD5, and CD23.  Gram stain and cultures were  negative.   Additional thoracentesis 06/252020, 05/23/2019 for a recurrent left pleural effusion.  Cytology on 05/23/2019 was negative for malignancy.  Sample had predominant small lymphocytes, few neutrophils, macrophages and eosinophils.  Flow cytometry revealed no phenotypically normal T lymphocytes comprising 98% of the cells.  There is a very small population of neoplastic B cells present, < 1% that were CD5+, CD20 23+, CD43+ and consistent with a prior diagnosis of CLL.  It seems unlikely that the small population of cells was causing the recurrent effusion.     11/20/2019 CT chest angiogram revealed no evidence of pulmonary embolus.  There was a small left pleural effusion with associated pleural thickening and passive atelectasis in the left lower lobe.  # Left sided pleural effusion-  Etiology was secondary to cardiopulmonary issues. Thoracentesis on 05/23/2019 revealed a very small population of neoplastic B cells likely due to contamination from blood (fluid bloody).     INTERVAL HISTORY Brent Hoover is a 88 y.o. male who has above history reviewed by me today presents for follow up visit for CLL  He has no new complaints.  Denies unintentional weight loss, night sweats, fever. Patient obtained 7 pounds comparing to his last visit in March 2023.  He has good appetite.  Past Medical History:  Diagnosis Date   Bladder neck obstruction    Cataracts, bilateral    Chronic lymphocytic leukemia (CLL), B-cell (HCC)    Lambda positive B-cell CLL   Gout    Hypercholesterolemia    Hypertension    Lymphocytosis    Sleep apnea    Thrombocytopenia ([place])     Past Surgical History:  Procedure Laterality Date   COLONOSCOPY WITH PROPOFOL N/A 09/10/2015  Procedure: COLONOSCOPY WITH PROPOFOL;  Surgeon: Lucilla Lame, MD;  Location: [facility] ENDOSCOPY;  Service: Endoscopy;  Laterality: N/A;   PROSTATE SURGERY  2008    Family History  Problem Relation Age of Onset   Lung cancer Mother 21        deceased   Brain cancer Son 66       pt's only son   Polycythemia Brother        half brother   Prostate cancer Brother     Social History:  reports that he has never smoked. He has never used smokeless tobacco. He reports that he does not drink alcohol and does not use drugs.    Allergies:  Allergies  Allergen Reactions   Penicillins Other (See Comments)    Has patient had a PCN reaction causing immediate rash, facial/tongue/throat swelling, SOB or lightheadedness with hypotension: Unknown Has patient had a PCN reaction causing severe rash involving mucus membranes or skin necrosis: Unknown Has patient had a PCN reaction that required hospitalization: Unknown Has patient had a PCN reaction occurring within the last 10 years: Unknown If all of the above answers are "NO", then may proceed with Cephalosporin use.   Atorvastatin Rash    Other reaction(s): Muscle pain Other reaction(s): Muscle pain   Terazosin Rash    Other reaction(s): Feeling faint Other reaction(s): Feeling faint    Current Medications: Current Outpatient Medications  Medication Sig Dispense Refill   amLODipine (NORVASC) 2.5 MG tablet Take 2.5 mg by mouth daily.     aspirin 81 MG EC tablet Take 1 tablet by mouth daily.     carvedilol (COREG) 3.125 MG tablet Take 1 tablet by mouth every 12 (twelve) hours.     ezetimibe (ZETIA) 10 MG tablet TAKE (1) TABLET BY MOUTH EVERY DAY 14 tablet 0   furosemide (LASIX) 40 MG tablet Take 1 tablet (40 mg total) by mouth daily. kowalski 30 tablet 0   lisinopril (ZESTRIL) 20 MG tablet TAKE (1) TABLET BY MOUTH DAILY. 30 tablet 0   doxycycline (VIBRAMYCIN) 100 MG capsule Take 1 capsule (100 mg total) by mouth 2 (two) times daily. (Patient not taking: Reported on 05/11/2022) 20 capsule 0   nitroGLYCERIN (NITROSTAT) 0.4 MG SL tablet Place 1 tablet (0.4 mg total) under the tongue every 5 (five) minutes as needed for chest pain. (Patient not taking: Reported on 05/11/2022) 15 tablet 0    tamsulosin (FLOMAX) 0.4 MG CAPS capsule TAKE (1) CAPSULE BY MOUTH EVERY DAY (Patient not taking: Reported on 05/11/2022) 90 capsule 0   No current facility-administered medications for this visit.   Review of Systems  Constitutional:  Positive for fatigue. Negative for appetite change, chills, fever and unexpected weight change.  HENT:   Negative for hearing loss and voice change.   Eyes:  Negative for eye problems and icterus.  Respiratory:  Negative for chest tightness, cough and shortness of breath.   Cardiovascular:  Negative for chest pain and leg swelling.       + Pacemaker  Gastrointestinal:  Negative for abdominal distention and abdominal pain.  Endocrine: Negative for hot flashes.  Genitourinary:  Negative for difficulty urinating, dysuria and frequency.   Musculoskeletal:  Negative for arthralgias.  Skin:  Negative for itching and rash.  Neurological:  Negative for light-headedness and numbness.  Hematological:  Negative for adenopathy. Does not bruise/bleed easily.  Psychiatric/Behavioral:  Negative for confusion.     Performance status (ECOG): 1-2  Vitals Blood pressure 135/72, pulse 83, temperature 97.9 F (  36.6 C), resp. rate 18, weight 237 lb (107.5 kg).   Physical Exam Vitals and nursing note reviewed.  Constitutional:      General: He is not in acute distress.    Appearance: He is obese. He is not diaphoretic.  HENT:     Head: Normocephalic and atraumatic.     Mouth/Throat:     Mouth: Mucous membranes are moist.     Pharynx: Oropharynx is clear.  Eyes:     General: No scleral icterus.    Pupils: Pupils are equal, round, and reactive to light.  Cardiovascular:     Rate and Rhythm: Normal rate and regular rhythm.     Pulses: Normal pulses.     Heart sounds: Normal heart sounds. No murmur heard. Pulmonary:     Effort: Pulmonary effort is normal. No respiratory distress.     Breath sounds: Normal breath sounds. No wheezing or rales.  Abdominal:      General: Bowel sounds are normal. There is no distension.     Palpations: Abdomen is soft.  Musculoskeletal:        General: No swelling or tenderness. Normal range of motion.     Cervical back: Normal range of motion and neck supple.     Right lower leg: Edema (1-2+) present.     Left lower leg: Edema (1-2+) present.  Lymphadenopathy:     Head:     Right side of head: No preauricular, posterior auricular or occipital adenopathy.     Left side of head: No preauricular, posterior auricular or occipital adenopathy.     Cervical: No cervical adenopathy.     Upper Body:     Right upper body: No supraclavicular or axillary adenopathy.     Left upper body: No supraclavicular or axillary adenopathy.     Lower Body: No right inguinal adenopathy. No left inguinal adenopathy.  Skin:    General: Skin is warm and dry.  Neurological:     Mental Status: He is alert and oriented to person, place, and time.     Comments: Hearing loss  Psychiatric:        Mood and Affect: Mood normal.    Appointment on 05/11/2022  Component Date Value Ref Range Status   LDH 05/11/2022 141  98 - 192 U/L Final   Performed at [facility], [address] [place], [place] [address]   Sodium 05/11/2022 133 (L)  135 - 145 mmol/L Final   Potassium 05/11/2022 4.7  3.5 - 5.1 mmol/L Final   Chloride 05/11/2022 99  98 - 111 mmol/L Final   CO2 05/11/2022 27  22 - 32 mmol/L Final   Glucose, Bld 05/11/2022 105 (H)  70 - 99 mg/dL Final   Glucose reference range applies only to samples taken after fasting for at least 8 hours.   BUN 05/11/2022 21  8 - 23 mg/dL Final   Creatinine, Ser 05/11/2022 1.22  0.61 - 1.24 mg/dL Final   Calcium 05/11/2022 8.3 (L)  8.9 - 10.3 mg/dL Final   Total Protein 05/11/2022 6.2 (L)  6.5 - 8.1 g/dL Final   Albumin 05/11/2022 3.9  3.5 - 5.0 g/dL Final   AST 05/11/2022 18  15 - 41 U/L Final   ALT 05/11/2022 14  0 - 44 U/L Final   Alkaline Phosphatase 05/11/2022 35 (L)  38 - 126 U/L Final    Total Bilirubin 05/11/2022 0.9  0.3 - 1.2 mg/dL Final   GFR, Estimated 05/11/2022 57 (L)  >60  mL/min Final   Comment: (NOTE) Calculated using the CKD-EPI Creatinine Equation (2021)    Anion gap 05/11/2022 7  5 - 15 Final   Performed at [facility], [place], [place] [address]   WBC 05/11/2022 15.3 (H)  4.0 - 10.5 K/uL Final   RBC 05/11/2022 5.34  4.22 - 5.81 MIL/uL Final   Hemoglobin 05/11/2022 16.0  13.0 - 17.0 g/dL Final   HCT 05/11/2022 48.9  39.0 - 52.0 % Final   MCV 05/11/2022 91.6  80.0 - 100.0 fL Final   MCH 05/11/2022 30.0  26.0 - 34.0 pg Final   MCHC 05/11/2022 32.7  30.0 - 36.0 g/dL Final   RDW 05/11/2022 14.0  11.5 - 15.5 % Final   Platelets 05/11/2022 150  150 - 400 K/uL Final   nRBC 05/11/2022 0.0  0.0 - 0.2 % Final   Neutrophils Relative % 05/11/2022 39  % Final   Neutro Abs 05/11/2022 6.0  1.7 - 7.7 K/uL Final   Lymphocytes Relative 05/11/2022 51  % Final   Lymphs Abs 05/11/2022 7.8 (H)  0.7 - 4.0 K/uL Final   Monocytes Relative 05/11/2022 6  % Final   Monocytes Absolute 05/11/2022 1.0  0.1 - 1.0 K/uL Final   Eosinophils Relative 05/11/2022 3  % Final   Eosinophils Absolute 05/11/2022 0.4  0.0 - 0.5 K/uL Final   Basophils Relative 05/11/2022 1  % Final   Basophils Absolute 05/11/2022 0.1  0.0 - 0.1 K/uL Final   WBC Morphology 05/11/2022 DIFF CONFIRMED BY MANUAL. CONSISTANT WITH KNOWN CLL.   Final   RBC Morphology 05/11/2022 UNREMARKABLE   Final   Smear Review 05/11/2022 Normal platelet morphology   Final   PLATELETS APPEAR ADEQUATE   Immature Granulocytes 05/11/2022 0  % Final   Abs Immature Granulocytes 05/11/2022 0.06  0.00 - 0.07 K/uL Final   Performed at [facility], [address], [place], [place] [address]    Assessment/plan 1. CLL (chronic lymphocytic leukemia) (HCC)   2. Stage 3a chronic kidney disease ([place])   3. 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 Physical examination showed no palpable adenopathy  Labs reviewed and discussed with patient Mild leukocytosis.  Currently at 33. No constitutional symptoms Recommend continue watchful waiting.  #Chronic kidney disease, , continue to monitor avoid nephrotoxin.  Encourage oral hydration #Hypocalcemia, recommend patient to start calcium supplementation 500 mg daily.  OTC supply.  RTC in 6 months for MD assessment and labs   I discussed the assessment and treatment plan with the patient.  The patient was provided an opportunity to ask questions and all were answered.  The patient agreed with the plan and demonstrated an understanding of the instructions.  The patient was advised to call back if the symptoms worsen or if the condition fails to improve as anticipated.   Earlie Server, MD, PhD 05/11/2022

## 2022-05-11 NOTE — Progress Notes (Signed)
Pt here for follow up. No new concerns voiced.   

## 2022-05-15 LAB — FISH HES LEUKEMIA, 4Q12 REA

## 2022-05-18 LAB — MISC LABCORP TEST (SEND OUT): Labcorp test code: 113753

## 2022-08-26 DIAGNOSIS — I5022 Chronic systolic (congestive) heart failure: Secondary | ICD-10-CM | POA: Diagnosis not present

## 2022-10-14 DIAGNOSIS — I350 Nonrheumatic aortic (valve) stenosis: Secondary | ICD-10-CM | POA: Diagnosis not present

## 2022-10-14 DIAGNOSIS — I071 Rheumatic tricuspid insufficiency: Secondary | ICD-10-CM | POA: Diagnosis not present

## 2022-10-14 DIAGNOSIS — Z950 Presence of cardiac pacemaker: Secondary | ICD-10-CM | POA: Diagnosis not present

## 2022-10-14 DIAGNOSIS — I482 Chronic atrial fibrillation, unspecified: Secondary | ICD-10-CM | POA: Diagnosis not present

## 2022-10-14 DIAGNOSIS — I700 Atherosclerosis of aorta: Secondary | ICD-10-CM | POA: Diagnosis not present

## 2022-11-11 ENCOUNTER — Inpatient Hospital Stay: Payer: Medicare Other | Attending: Oncology

## 2022-11-11 ENCOUNTER — Encounter: Payer: Self-pay | Admitting: Oncology

## 2022-11-11 ENCOUNTER — Inpatient Hospital Stay (HOSPITAL_BASED_OUTPATIENT_CLINIC_OR_DEPARTMENT_OTHER): Payer: Medicare Other | Admitting: Oncology

## 2022-11-11 VITALS — BP 160/87 | HR 84 | Temp 96.8°F | Wt 239.5 lb

## 2022-11-11 DIAGNOSIS — Z7982 Long term (current) use of aspirin: Secondary | ICD-10-CM | POA: Insufficient documentation

## 2022-11-11 DIAGNOSIS — Z801 Family history of malignant neoplasm of trachea, bronchus and lung: Secondary | ICD-10-CM | POA: Insufficient documentation

## 2022-11-11 DIAGNOSIS — Z808 Family history of malignant neoplasm of other organs or systems: Secondary | ICD-10-CM | POA: Insufficient documentation

## 2022-11-11 DIAGNOSIS — Z8042 Family history of malignant neoplasm of prostate: Secondary | ICD-10-CM | POA: Insufficient documentation

## 2022-11-11 DIAGNOSIS — N1831 Chronic kidney disease, stage 3a: Secondary | ICD-10-CM

## 2022-11-11 DIAGNOSIS — C9110 Chronic lymphocytic leukemia of B-cell type not having achieved remission: Secondary | ICD-10-CM

## 2022-11-11 DIAGNOSIS — N183 Chronic kidney disease, stage 3 unspecified: Secondary | ICD-10-CM | POA: Diagnosis not present

## 2022-11-11 DIAGNOSIS — I129 Hypertensive chronic kidney disease with stage 1 through stage 4 chronic kidney disease, or unspecified chronic kidney disease: Secondary | ICD-10-CM | POA: Diagnosis not present

## 2022-11-11 DIAGNOSIS — Z79899 Other long term (current) drug therapy: Secondary | ICD-10-CM | POA: Insufficient documentation

## 2022-11-11 LAB — COMPREHENSIVE METABOLIC PANEL
ALT: 14 U/L (ref 0–44)
AST: 20 U/L (ref 15–41)
Albumin: 3.9 g/dL (ref 3.5–5.0)
Alkaline Phosphatase: 34 U/L — ABNORMAL LOW (ref 38–126)
Anion gap: 9 (ref 5–15)
BUN: 19 mg/dL (ref 8–23)
CO2: 28 mmol/L (ref 22–32)
Calcium: 8.8 mg/dL — ABNORMAL LOW (ref 8.9–10.3)
Chloride: 101 mmol/L (ref 98–111)
Creatinine, Ser: 1.28 mg/dL — ABNORMAL HIGH (ref 0.61–1.24)
GFR, Estimated: 53 mL/min — ABNORMAL LOW (ref >60)
Glucose, Bld: 119 mg/dL — ABNORMAL HIGH (ref 70–99)
Potassium: 4 mmol/L (ref 3.5–5.1)
Sodium: 138 mmol/L (ref 135–145)
Total Bilirubin: 0.9 mg/dL (ref 0.3–1.2)
Total Protein: 6.2 g/dL — ABNORMAL LOW (ref 6.5–8.1)

## 2022-11-11 LAB — CBC WITH DIFFERENTIAL/PLATELET
Abs Immature Granulocytes: 0.05 10*3/uL (ref 0.00–0.07)
Basophils Absolute: 0.1 10*3/uL (ref 0.0–0.1)
Basophils Relative: 1 %
Eosinophils Absolute: 0.3 10*3/uL (ref 0.0–0.5)
Eosinophils Relative: 2 %
HCT: 45.8 % (ref 39.0–52.0)
Hemoglobin: 15.1 g/dL (ref 13.0–17.0)
Immature Granulocytes: 0 %
Lymphocytes Relative: 53 %
Lymphs Abs: 8.6 10*3/uL — ABNORMAL HIGH (ref 0.7–4.0)
MCH: 29.5 pg (ref 26.0–34.0)
MCHC: 33 g/dL (ref 30.0–36.0)
MCV: 89.5 fL (ref 80.0–100.0)
Monocytes Absolute: 1.8 10*3/uL — ABNORMAL HIGH (ref 0.1–1.0)
Monocytes Relative: 11 %
Neutro Abs: 5.4 10*3/uL (ref 1.7–7.7)
Neutrophils Relative %: 33 %
Platelets: 147 10*3/uL — ABNORMAL LOW (ref 150–400)
RBC: 5.12 MIL/uL (ref 4.22–5.81)
RDW: 13.9 % (ref 11.5–15.5)
Smear Review: NORMAL
WBC: 16.2 10*3/uL — ABNORMAL HIGH (ref 4.0–10.5)
nRBC: 0 % (ref 0.0–0.2)

## 2022-11-11 LAB — LACTATE DEHYDROGENASE: LDH: 143 U/L (ref 98–192)

## 2022-11-11 NOTE — Assessment & Plan Note (Addendum)
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 Physical examination showed no palpable adenopathy  Labs reviewed and discussed with patient Mild leukocytosis.  Currently at 16 No constitutional symptoms Recommend continue watchful waiting.

## 2022-11-11 NOTE — Progress Notes (Signed)
Hematology/Oncology Progress note Telephone:(336) [phone] Fax:(336) [phone]    Chief Complaint: Brent Hoover is a 89 y.o. 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 Physical examination showed no palpable adenopathy  Labs reviewed and discussed with patient Mild leukocytosis.  Currently at 16 No constitutional symptoms Recommend continue watchful waiting.  CKD (chronic kidney disease), stage III Encourage oral hydration and avoid nephrotoxins.    Orders Placed This Encounter  Procedures   CBC with Differential/Platelet    Standing Status:   Future    Standing Expiration Date:   11/11/2023   Comprehensive metabolic panel    Standing Status:   Future    Standing Expiration Date:   11/11/2023   Lactate dehydrogenase    Standing Status:   Future    Standing Expiration Date:   11/12/2023   Follow up in 6 months.  All questions were answered. The patient knows to call the clinic with any problems, questions or concerns.  Earlie Server, MD, PhD [facility] Hematology Oncology 11/11/2022   PERTINENT ONCOLOGY HISTORY Brent Hoover is a 89 y.o.[place] who has above oncology history reviewed by me today presented for follow up visit for CLL. Patient previously followed up by Dr.Corcoran, patient switched care to me on 04/17/21 Extensive medical record review was performed by me  Chronic lymphocytic leukemia 08/06/2015, establish care with Dr. Rogue Bussing.  At that time patient was symptomatic and was on watchful waiting. 11/05/2015  Flow cytometry revealed CLL, B-cell, CD38 negative. 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 07/27/2017, presented with constitutional symptoms with 30 to 40 pounds unintentional weight loss in 6 months, night sweats.  Early satiety,LLQ abdominal discomfort.  07/29/2017 CT  abdomen and pelvis reviewed splenomegaly, lymphadenopathy in the abdominal and pelvis.  Stranding along with some omental adipose tissue.  Left lower quadrant.  08/04/2017 - 01/05/2018. Gazyva (obinutuzumab) monthly x 6  03/04/2018   Abdomen and pelvis CT on  revealed improved splenomegaly (480 cc) and resolved adenopathy.  He has a developmental venous variant (double IVC).  Subsequently patient has been on surveillance every 3 months.    03/31/2019 - 04/03/2019 admission due to shortness of breath and a left sided pleuraleffusion.  Chest CT on 04/03/2019 revealed a moderate loculated left pleural effusion with no mediastinal, hilar or axillary adenopathy.  Left thoracentesis on 04/03/2019 revealed 900 cc of fluid c/w a transudate.  Cytology was negative.  There were reactive mesothelial cells and predominant lymphocytes.  The majority of the lymphocytes stained with CD5 (T-cell marker).  There was no co-expression of PAX 5 (B-cell marker), CD5, and CD23.  Gram stain and cultures were negative.   Additional thoracentesis 06/252020, 05/23/2019 for a recurrent left pleural effusion.  Cytology on 05/23/2019 was negative for malignancy.  Sample had predominant small lymphocytes, few neutrophils, macrophages and eosinophils.  Flow cytometry revealed no phenotypically normal T lymphocytes comprising 98% of the cells.  There is a very small population of neoplastic B cells present, < 1% that were CD5+, CD20 23+, CD43+ and consistent with a prior diagnosis of CLL.  It seems unlikely that the small population of cells was causing the recurrent effusion.     11/20/2019 CT chest angiogram revealed no evidence of pulmonary embolus.  There was a small left pleural effusion with associated pleural thickening and passive atelectasis in the left  lower lobe.  # Left sided pleural effusion-  Etiology was secondary to cardiopulmonary issues. Thoracentesis on 05/23/2019 revealed a very small population of neoplastic B  cells likely due to contamination from blood (fluid bloody).     INTERVAL HISTORY Brent Hoover is a 89 y.o. male who has above history reviewed by me today presents for follow up visit for CLL  He has no new complaints.  Denies unintentional weight loss, night sweats, fever. Weight is stable  He has good appetite.  Past Medical History:  Diagnosis Date   Bladder neck obstruction    Cataracts, bilateral    Chronic lymphocytic leukemia (CLL), B-cell (HCC)    Lambda positive B-cell CLL   Gout    Hypercholesterolemia    Hypertension    Lymphocytosis    Sleep apnea    Thrombocytopenia (HCC)     Past Surgical History:  Procedure Laterality Date   COLONOSCOPY WITH PROPOFOL N/A 09/10/2015   Procedure: COLONOSCOPY WITH PROPOFOL;  Surgeon: Lucilla Lame, MD;  Location: [facility] ENDOSCOPY;  Service: Endoscopy;  Laterality: N/A;   PROSTATE SURGERY  2008    Family History  Problem Relation Age of Onset   Lung cancer Mother 5       deceased   Brain cancer Son 52       pt's only son   Polycythemia Brother        half brother   Prostate cancer Brother     Social History:  reports that he has never smoked. He has never used smokeless tobacco. He reports that he does not drink alcohol and does not use drugs.    Allergies:  Allergies  Allergen Reactions   Penicillins Other (See Comments)    Has patient had a PCN reaction causing immediate rash, facial/tongue/throat swelling, SOB or lightheadedness with hypotension: Unknown Has patient had a PCN reaction causing severe rash involving mucus membranes or skin necrosis: Unknown Has patient had a PCN reaction that required hospitalization: Unknown Has patient had a PCN reaction occurring within the last 10 years: Unknown If all of the above answers are "NO", then may proceed with Cephalosporin use.   Atorvastatin Rash    Other reaction(s): Muscle pain Other reaction(s): Muscle pain   Terazosin Rash    Other reaction(s): Feeling  faint Other reaction(s): Feeling faint    Current Medications: Current Outpatient Medications  Medication Sig Dispense Refill   amLODipine (NORVASC) 2.5 MG tablet Take 2.5 mg by mouth daily.     aspirin 81 MG EC tablet Take 1 tablet by mouth daily.     carvedilol (COREG) 3.125 MG tablet Take 1 tablet by mouth every 12 (twelve) hours.     ezetimibe (ZETIA) 10 MG tablet TAKE (1) TABLET BY MOUTH EVERY DAY 14 tablet 0   ezetimibe (ZETIA) 10 MG tablet Take 1 tablet by mouth daily.     furosemide (LASIX) 40 MG tablet Take 1 tablet (40 mg total) by mouth daily. kowalski 30 tablet 0   lisinopril (ZESTRIL) 20 MG tablet TAKE (1) TABLET BY MOUTH DAILY. 30 tablet 0   doxycycline (VIBRAMYCIN) 100 MG capsule Take 1 capsule (100 mg total) by mouth 2 (two) times daily. (Patient not taking: Reported on 11/11/2022) 20 capsule 0   nitroGLYCERIN (NITROSTAT) 0.4 MG SL tablet Place 1 tablet (0.4 mg total) under the tongue every 5 (five) minutes as needed for chest pain. (Patient not taking: Reported on 05/11/2022) 15 tablet 0   tamsulosin (FLOMAX) 0.4 MG CAPS capsule TAKE (1)  CAPSULE BY MOUTH EVERY DAY (Patient not taking: Reported on 05/11/2022) 90 capsule 0   No current facility-administered medications for this visit.   Review of Systems  Constitutional:  Positive for fatigue. Negative for appetite change, chills, fever and unexpected weight change.  HENT:   Negative for hearing loss and voice change.   Eyes:  Negative for eye problems and icterus.  Respiratory:  Negative for chest tightness, cough and shortness of breath.   Cardiovascular:  Negative for chest pain and leg swelling.       + Pacemaker  Gastrointestinal:  Negative for abdominal distention and abdominal pain.  Endocrine: Negative for hot flashes.  Genitourinary:  Negative for difficulty urinating, dysuria and frequency.   Musculoskeletal:  Negative for arthralgias.  Skin:  Negative for itching and rash.  Neurological:  Negative for  light-headedness and numbness.  Hematological:  Negative for adenopathy. Does not bruise/bleed easily.  Psychiatric/Behavioral:  Negative for confusion.     Performance status (ECOG): 1-2  Vitals Blood pressure (!) 160/87, pulse 84, temperature (!) 96.8 F (36 C), temperature source Tympanic, weight 239 lb 8 oz (108.6 kg), SpO2 93 %.   Physical Exam Constitutional:      General: He is not in acute distress.    Appearance: He is obese. He is not diaphoretic.  HENT:     Mouth/Throat:     Pharynx: No oropharyngeal exudate.  Eyes:     General: No scleral icterus. Cardiovascular:     Rate and Rhythm: Normal rate and regular rhythm.     Heart sounds: No murmur heard. Pulmonary:     Effort: Pulmonary effort is normal. No respiratory distress.     Breath sounds: Normal breath sounds. No wheezing.  Abdominal:     General: Bowel sounds are normal. There is no distension.     Palpations: Abdomen is soft.  Musculoskeletal:        General: Normal range of motion.     Cervical back: Normal range of motion and neck supple.  Lymphadenopathy:     Cervical: No cervical adenopathy.  Skin:    General: Skin is warm and dry.     Findings: No erythema.  Neurological:     Mental Status: He is alert and oriented to person, place, and time. Mental status is at baseline.     Cranial Nerves: No cranial nerve deficit.     Motor: No abnormal muscle tone.  Psychiatric:        Mood and Affect: Mood and affect normal.     Labs    Latest Ref Rng & Units 11/11/2022    1:47 PM 05/11/2022    1:41 PM 01/05/2022   11:01 AM  CBC  WBC 4.0 - 10.5 K/uL 16.2  15.3  15.0   Hemoglobin 13.0 - 17.0 g/dL 15.1  16.0  16.7   Hematocrit 39.0 - 52.0 % 45.8  48.9  50.8   Platelets 150 - 400 K/uL 147  150  153       Latest Ref Rng & Units 11/11/2022    1:47 PM 05/11/2022    1:41 PM 01/05/2022   11:01 AM  CMP  Glucose 70 - 99 mg/dL 119  105  108   BUN 8 - 23 mg/dL '19  21  16   '$ Creatinine 0.61 - 1.24 mg/dL 1.28   1.22  1.34   Sodium 135 - 145 mmol/L 138  133  136   Potassium 3.5 - 5.1 mmol/L 4.0  4.7  4.5   Chloride 98 - 111 mmol/L 101  99  102   CO2 22 - 32 mmol/L '28  27  25   '$ Calcium 8.9 - 10.3 mg/dL 8.8  8.3  8.8   Total Protein 6.5 - 8.1 g/dL 6.2  6.2  6.7   Total Bilirubin 0.3 - 1.2 mg/dL 0.9  0.9  1.3   Alkaline Phos 38 - 126 U/L 34  35  40   AST 15 - 41 U/L '20  18  21   '$ ALT 0 - 44 U/L 14  14  14

## 2022-11-11 NOTE — Assessment & Plan Note (Signed)
Encourage oral hydration and avoid nephrotoxins.   

## 2022-12-18 DIAGNOSIS — H353132 Nonexudative age-related macular degeneration, bilateral, intermediate dry stage: Secondary | ICD-10-CM | POA: Diagnosis not present

## 2023-02-09 ENCOUNTER — Other Ambulatory Visit: Payer: Self-pay

## 2023-02-09 ENCOUNTER — Inpatient Hospital Stay
Admission: EM | Admit: 2023-02-09 | Discharge: 2023-02-12 | DRG: 291 | Disposition: A | Discharge status: Home Health | Payer: Medicare Other | Attending: Internal Medicine | Admitting: Internal Medicine

## 2023-02-09 ENCOUNTER — Emergency Department: Payer: Medicare Other

## 2023-02-09 ENCOUNTER — Encounter: Payer: Self-pay | Admitting: Emergency Medicine

## 2023-02-09 ENCOUNTER — Observation Stay: Payer: Medicare Other

## 2023-02-09 DIAGNOSIS — R0902 Hypoxemia: Secondary | ICD-10-CM | POA: Diagnosis present

## 2023-02-09 DIAGNOSIS — Z79899 Other long term (current) drug therapy: Secondary | ICD-10-CM

## 2023-02-09 DIAGNOSIS — C9110 Chronic lymphocytic leukemia of B-cell type not having achieved remission: Secondary | ICD-10-CM | POA: Diagnosis present

## 2023-02-09 DIAGNOSIS — R609 Edema, unspecified: Secondary | ICD-10-CM | POA: Diagnosis not present

## 2023-02-09 DIAGNOSIS — I110 Hypertensive heart disease with heart failure: Secondary | ICD-10-CM | POA: Diagnosis not present

## 2023-02-09 DIAGNOSIS — G4733 Obstructive sleep apnea (adult) (pediatric): Secondary | ICD-10-CM | POA: Diagnosis present

## 2023-02-09 DIAGNOSIS — Z950 Presence of cardiac pacemaker: Secondary | ICD-10-CM

## 2023-02-09 DIAGNOSIS — I130 Hypertensive heart and chronic kidney disease with heart failure and stage 1 through stage 4 chronic kidney disease, or unspecified chronic kidney disease: Secondary | ICD-10-CM | POA: Diagnosis present

## 2023-02-09 DIAGNOSIS — Z7982 Long term (current) use of aspirin: Secondary | ICD-10-CM

## 2023-02-09 DIAGNOSIS — Z8042 Family history of malignant neoplasm of prostate: Secondary | ICD-10-CM

## 2023-02-09 DIAGNOSIS — Z880 Allergy status to penicillin: Secondary | ICD-10-CM

## 2023-02-09 DIAGNOSIS — R0603 Acute respiratory distress: Secondary | ICD-10-CM | POA: Diagnosis not present

## 2023-02-09 DIAGNOSIS — I482 Chronic atrial fibrillation, unspecified: Secondary | ICD-10-CM | POA: Diagnosis present

## 2023-02-09 DIAGNOSIS — N1831 Chronic kidney disease, stage 3a: Secondary | ICD-10-CM | POA: Diagnosis present

## 2023-02-09 DIAGNOSIS — I5023 Acute on chronic systolic (congestive) heart failure: Principal | ICD-10-CM

## 2023-02-09 DIAGNOSIS — I5032 Chronic diastolic (congestive) heart failure: Secondary | ICD-10-CM | POA: Diagnosis present

## 2023-02-09 DIAGNOSIS — Z808 Family history of malignant neoplasm of other organs or systems: Secondary | ICD-10-CM

## 2023-02-09 DIAGNOSIS — I10 Essential (primary) hypertension: Secondary | ICD-10-CM | POA: Diagnosis present

## 2023-02-09 DIAGNOSIS — M109 Gout, unspecified: Secondary | ICD-10-CM | POA: Diagnosis present

## 2023-02-09 DIAGNOSIS — N183 Chronic kidney disease, stage 3 unspecified: Secondary | ICD-10-CM | POA: Diagnosis present

## 2023-02-09 DIAGNOSIS — Z801 Family history of malignant neoplasm of trachea, bronchus and lung: Secondary | ICD-10-CM | POA: Diagnosis not present

## 2023-02-09 DIAGNOSIS — R069 Unspecified abnormalities of breathing: Secondary | ICD-10-CM | POA: Diagnosis not present

## 2023-02-09 DIAGNOSIS — I7121 Aneurysm of the ascending aorta, without rupture: Secondary | ICD-10-CM | POA: Diagnosis present

## 2023-02-09 DIAGNOSIS — R0689 Other abnormalities of breathing: Secondary | ICD-10-CM | POA: Diagnosis not present

## 2023-02-09 DIAGNOSIS — I509 Heart failure, unspecified: Secondary | ICD-10-CM

## 2023-02-09 DIAGNOSIS — J9811 Atelectasis: Secondary | ICD-10-CM | POA: Diagnosis not present

## 2023-02-09 DIAGNOSIS — R531 Weakness: Secondary | ICD-10-CM | POA: Diagnosis not present

## 2023-02-09 DIAGNOSIS — I5043 Acute on chronic combined systolic (congestive) and diastolic (congestive) heart failure: Secondary | ICD-10-CM | POA: Diagnosis present

## 2023-02-09 DIAGNOSIS — Z888 Allergy status to other drugs, medicaments and biological substances status: Secondary | ICD-10-CM | POA: Diagnosis not present

## 2023-02-09 DIAGNOSIS — E7800 Pure hypercholesterolemia, unspecified: Secondary | ICD-10-CM | POA: Diagnosis present

## 2023-02-09 DIAGNOSIS — I3139 Other pericardial effusion (noninflammatory): Secondary | ICD-10-CM | POA: Diagnosis not present

## 2023-02-09 DIAGNOSIS — J90 Pleural effusion, not elsewhere classified: Secondary | ICD-10-CM | POA: Diagnosis not present

## 2023-02-09 DIAGNOSIS — R0602 Shortness of breath: Secondary | ICD-10-CM | POA: Diagnosis not present

## 2023-02-09 LAB — D-DIMER, QUANTITATIVE: D-Dimer, Quant: 0.54 ug/mL-FEU — ABNORMAL HIGH (ref 0.00–0.50)

## 2023-02-09 LAB — BASIC METABOLIC PANEL
Anion gap: 8 (ref 5–15)
BUN: 16 mg/dL (ref 8–23)
CO2: 26 mmol/L (ref 22–32)
Calcium: 8.8 mg/dL — ABNORMAL LOW (ref 8.9–10.3)
Chloride: 105 mmol/L (ref 98–111)
Creatinine, Ser: 1.07 mg/dL (ref 0.61–1.24)
GFR, Estimated: >60 mL/min (ref >60)
Glucose, Bld: 117 mg/dL — ABNORMAL HIGH (ref 70–99)
Potassium: 4.2 mmol/L (ref 3.5–5.1)
Sodium: 139 mmol/L (ref 135–145)

## 2023-02-09 LAB — CBC
HCT: 46.5 % (ref 39.0–52.0)
Hemoglobin: 14.8 g/dL (ref 13.0–17.0)
MCH: 29.4 pg (ref 26.0–34.0)
MCHC: 31.8 g/dL (ref 30.0–36.0)
MCV: 92.4 fL (ref 80.0–100.0)
Platelets: 130 10*3/uL — ABNORMAL LOW (ref 150–400)
RBC: 5.03 MIL/uL (ref 4.22–5.81)
RDW: 14.6 % (ref 11.5–15.5)
WBC: 17.1 10*3/uL — ABNORMAL HIGH (ref 4.0–10.5)
nRBC: 0 % (ref 0.0–0.2)

## 2023-02-09 LAB — TROPONIN I (HIGH SENSITIVITY)
Troponin I (High Sensitivity): 30 ng/L — ABNORMAL HIGH (ref <18)
Troponin I (High Sensitivity): 39 ng/L — ABNORMAL HIGH (ref <18)

## 2023-02-09 LAB — BRAIN NATRIURETIC PEPTIDE: B Natriuretic Peptide: 832.3 pg/mL — ABNORMAL HIGH (ref 0.0–100.0)

## 2023-02-09 MED ORDER — ASPIRIN 81 MG PO TBEC
81.0000 mg | DELAYED_RELEASE_TABLET | Freq: Every day | ORAL | Status: DC
  Administered 2023-02-10 – 2023-02-12 (×3): 81 mg via ORAL
  Filled 2023-02-09 (×3): qty 1

## 2023-02-09 MED ORDER — ENOXAPARIN SODIUM 40 MG/0.4ML IJ SOSY
40.0000 mg | PREFILLED_SYRINGE | INTRAMUSCULAR | Status: DC
  Administered 2023-02-09 – 2023-02-11 (×3): 40 mg via SUBCUTANEOUS
  Filled 2023-02-09 (×3): qty 0.4

## 2023-02-09 MED ORDER — ONDANSETRON HCL 4 MG/2ML IJ SOLN: 4.0000 mg | Freq: Four times a day (QID) | INTRAMUSCULAR | Status: DC | PRN

## 2023-02-09 MED ORDER — EZETIMIBE 10 MG PO TABS
10.0000 mg | ORAL_TABLET | Freq: Every day | ORAL | Status: DC
  Administered 2023-02-10 – 2023-02-12 (×3): 10 mg via ORAL
  Filled 2023-02-09 (×3): qty 1

## 2023-02-09 MED ORDER — LISINOPRIL 20 MG PO TABS
20.0000 mg | ORAL_TABLET | Freq: Every day | ORAL | Status: DC
  Administered 2023-02-10 – 2023-02-12 (×3): 20 mg via ORAL
  Filled 2023-02-09: qty 1
  Filled 2023-02-09: qty 2
  Filled 2023-02-09: qty 1

## 2023-02-09 MED ORDER — ACETAMINOPHEN 325 MG PO TABS: 650.0000 mg | ORAL_TABLET | ORAL | Status: DC | PRN

## 2023-02-09 MED ORDER — SODIUM CHLORIDE 0.9% FLUSH
3.0000 mL | Freq: Two times a day (BID) | INTRAVENOUS | Status: DC
  Administered 2023-02-09 – 2023-02-12 (×6): 3 mL via INTRAVENOUS

## 2023-02-09 MED ORDER — AMLODIPINE BESYLATE 5 MG PO TABS
2.5000 mg | ORAL_TABLET | Freq: Every day | ORAL | Status: DC
  Administered 2023-02-10 – 2023-02-12 (×3): 2.5 mg via ORAL
  Filled 2023-02-09 (×3): qty 1

## 2023-02-09 MED ORDER — CARVEDILOL 3.125 MG PO TABS
3.1250 mg | ORAL_TABLET | Freq: Two times a day (BID) | ORAL | Status: DC
  Administered 2023-02-10 – 2023-02-12 (×5): 3.125 mg via ORAL
  Filled 2023-02-09 (×5): qty 1

## 2023-02-09 MED ORDER — SODIUM CHLORIDE 0.9% FLUSH: 3.0000 mL | INTRAVENOUS | Status: DC | PRN

## 2023-02-09 MED ORDER — FUROSEMIDE 10 MG/ML IJ SOLN
80.0000 mg | Freq: Every day | INTRAMUSCULAR | Status: DC
  Administered 2023-02-10 – 2023-02-12 (×3): 80 mg via INTRAVENOUS
  Filled 2023-02-09 (×3): qty 8

## 2023-02-09 MED ORDER — SODIUM CHLORIDE 0.9 % IV SOLN: 250.0000 mL | INTRAVENOUS | Status: DC | PRN

## 2023-02-09 MED ORDER — FUROSEMIDE 10 MG/ML IJ SOLN
60.0000 mg | Freq: Once | INTRAMUSCULAR | Status: AC
  Administered 2023-02-09: 60 mg via INTRAVENOUS
  Filled 2023-02-09: qty 8

## 2023-02-09 NOTE — Assessment & Plan Note (Signed)
Renal function currently at baseline with GFR above 60.  Will monitor closely while IV diuresing.  - Daily BMP - Strict in and out

## 2023-02-09 NOTE — ED Notes (Signed)
Patient transported to CT 

## 2023-02-09 NOTE — Assessment & Plan Note (Addendum)
Patient is presenting with gradually worsening heart failure symptoms including shortness of breath and lower extremity swelling with history of HFrEF with last EF of 45% in 2022.  Patient followed up with Dr. Gwen Pounds in December 2023 at which time he was instructed to continue 40 mg of Lasix daily.  Unclear if patient is currently taking it daily.  D-dimer was obtained due to an even lower extremity swelling, which patient states is chronic after a previous injury to the leg.  D-dimer only minimally elevated at 0.54.  Will not obtain CTA at this time.  - Telemetry monitoring - Start Lasix 80 mg IV daily - Echocardiogram ordered - CT chest without contrast to evaluate effusion given prior need for thoracentesis - Strict in and out - Daily weights - Continue home GDMT

## 2023-02-09 NOTE — Assessment & Plan Note (Signed)
Not on anticoagulation.  EKG demonstrates ventricular pacing.

## 2023-02-09 NOTE — ED Notes (Signed)
First Nurse Note: Patient to ED via [facility] from home for SOB. Hx of pacemaker. Last couple weeks gain weight and leg/belly swelling. No hx of CHF.

## 2023-02-09 NOTE — Assessment & Plan Note (Signed)
Continue CPAP at bedtime 

## 2023-02-09 NOTE — ED Triage Notes (Signed)
See first nurse note. 

## 2023-02-09 NOTE — Assessment & Plan Note (Signed)
Elevated WBC consistent with patient's prior baseline.  He follows up with Dr. [name] with last visit in January 2024.

## 2023-02-09 NOTE — Assessment & Plan Note (Signed)
Blood pressure above goal at this time, however asymptomatic.  - Restart home antihypertensives

## 2023-02-09 NOTE — ED Provider Notes (Signed)
[facility] Provider Note    Event Date/Time   First MD Initiated Contact with Patient 02/09/23 1708     (approximate)   History   Shortness of Breath   HPI  Brent Hoover is a 89 y.o. male past medical history significant for CLL, mild aortic valve stenosis, symptomatic bradycardia status post pacemaker, thoracic ascending aortic aneurysm, CHF, atrial fibrillation (no chronic anticoagulation secondary to fall risk),  OSA, presents to the emergency department with progressively worsening shortness of breath.  States that he has had shortness of breath that has been worsening over the past couple of weeks.  Finally today his family members were able to talk him into coming to the emergency department.  States that he is developed significant shortness of breath with only minimal amount of exertion and has to sit down.  Does not normally wear home oxygen.  Uses CPAP machine at nighttime.  No history of tobacco use or history of COPD.  Compliant with all of his home medications including Lasix 40 mg.  Recent weight gain of approximately 15 pounds over the past 2 weeks.  Denies any chest pain.  History of thoracentesis twice but states this has not been needed for multiple years.     Physical Exam   Triage Vital Signs: ED Triage Vitals  Enc Vitals Group     BP 02/09/23 1658 (!) 177/100     Pulse Rate 02/09/23 1658 65     Resp 02/09/23 1658 (!) 26     Temp 02/09/23 1658 97.7 F (36.5 C)     Temp Source 02/09/23 1658 Oral     SpO2 02/09/23 1658 95 %     Weight --      Height --      Head Circumference --      Peak Flow --      Pain Score 02/09/23 1659 0     Pain Loc --      Pain Edu? --      Excl. in GC? --     Most recent vital signs: Vitals:   02/09/23 1658  BP: (!) 177/100  Pulse: 65  Resp: (!) 26  Temp: 97.7 F (36.5 C)  SpO2: 95%    Physical Exam Constitutional:      Appearance: He is well-developed. He is obese. He is ill-appearing.   HENT:     Head: Atraumatic.  Eyes:     Conjunctiva/sclera: Conjunctivae normal.  Cardiovascular:     Rate and Rhythm: Regular rhythm.  Pulmonary:     Effort: Respiratory distress present.     Breath sounds: Wheezing and rales present.     Comments: Speaking in 2 word sentences.  Tachypneic.  Crackles to bilateral lower lung fields with end expiratory wheeze throughout. Musculoskeletal:     Cervical back: Normal range of motion.     Right lower leg: No edema.     Left lower leg: Edema present.     Comments: 2+ pitting edema to the left lower extremity, no pitting edema to the right lower extremity.  Patient states that this is chronic in nature from prior injury.  Skin:    General: Skin is warm.     Capillary Refill: Capillary refill takes less than 2 seconds.  Neurological:     Mental Status: He is alert. Mental status is at baseline.     IMPRESSION / MDM / ASSESSMENT AND PLAN / ED COURSE  I reviewed the triage vital signs and  the nursing notes.  On chart review last echocardiogram that I can see is in 2020 and showed an EF of 40 to 45%  Differential diagnosis including pneumonia, CHF exacerbation, ACS, pulmonary embolism, anemia   EKG  I, Corena Herter, the attending physician, personally viewed and interpreted this ECG.   Rate: Normal  Rhythm: Normal sinus  Axis: Normal  Intervals: Ventricularly paced rhythm  ST&T Change: Negative Sgarbossa's criteria No significant change when compared to prior EKG  No tachycardic or bradycardic dysrhythmias while on cardiac telemetry.  RADIOLOGY I independently reviewed imaging, my interpretation of imaging: Chest x-ray with cardiomegaly, appears to have a pleural effusion to the left side.  Read as small left pleural effusion with atelectasis or infiltrate.  LABS (all labs ordered are listed, but only abnormal results are displayed) Labs interpreted as -    Labs Reviewed  BASIC METABOLIC PANEL - Abnormal; Notable for the  following components:      Result Value   Glucose, Bld 117 (*)    Calcium 8.8 (*)    All other components within normal limits  CBC - Abnormal; Notable for the following components:   WBC 17.1 (*)    Platelets 130 (*)    All other components within normal limits  BRAIN NATRIURETIC PEPTIDE - Abnormal; Notable for the following components:   B Natriuretic Peptide 832.3 (*)    All other components within normal limits  TROPONIN I (HIGH SENSITIVITY) - Abnormal; Notable for the following components:   Troponin I (High Sensitivity) 30 (*)    All other components within normal limits  D-DIMER, QUANTITATIVE     MDM  Clinical picture is most concerning for CHF exacerbation.  Patient with significant weight gain, appears to have pleural effusion on chest x-ray with a significantly elevated BNP.  Does have an elevated troponin of 30, believe that this is secondary demand from heart failure exacerbation.  Does have leukocytosis which is likely in the setting of his known CLL.  No significant anemia.  Low risk Wells criteria, given his unilateral leg swelling even though he does state that it is chronic in nature, will add on a D-dimer to further risk ratified for pulmonary embolism.  If elevated will obtain CT angiography to evaluate for PE.  Given IV Lasix 60 mg for acute on chronic heart failure exacerbation with respiratory distress.  Consulted hospitalist for admission.     PROCEDURES:  Critical Care performed: No  Procedures  Patient's presentation is most consistent with acute presentation with potential threat to life or bodily function.   MEDICATIONS ORDERED IN ED: Medications  furosemide (LASIX) injection 60 mg (60 mg Intravenous Given 02/09/23 1810)    FINAL CLINICAL IMPRESSION(S) / ED DIAGNOSES   Final diagnoses:  Hypoxia  Acute on chronic systolic congestive heart failure  Respiratory distress     Rx / DC Orders   ED Discharge Orders     None        Note:   This document was prepared using Dragon voice recognition software and may include unintentional dictation errors.   Corena Herter, MD 02/09/23 1827

## 2023-02-09 NOTE — H&P (Signed)
History and Physical    Patient: Brent Hoover RUE:[id number] DOB: 11-26-1929 DOA: 02/09/2023 DOS: the patient was seen and examined on 02/09/2023 PCP: Pcp, No  Patient coming from: Home  Chief Complaint:  Chief Complaint  Patient presents with   Shortness of Breath   HPI: Brent Hoover is a 89 y.o. male with medical history significant of chronic systolic heart failure with last EF of 40-45%, chronic lymphocytic leukemia previously treated with Gazyva, mild aortic valve stenosis, bradycardia s/p PPM, atrial fibrillation not on AC, OSA on CPAP, hypertension, hyperlipidemia, who presents to the ED with shortness of breath.  Mr. Brent Hoover has been experiencing gradually worsening shortness of breath that has been going on for couple months.  Due to the progressive nature of the symptoms, his family members insisted that he come in today.  He endorses worsening bilateral lower extremity swelling.  He endorses night sweats but denies any fever, chills, nausea, vomiting, diarrhea, chest pain or palpitations.  He has noted that his abdomen seems more tight lately.  He has gained approximately 15 pounds in the last 2 weeks.  He is uncertain if he is taking furosemide daily.  ED course: On arrival to the ED, patient was hypertensive at 177/100 with heart rate of 65.  He was saturating at 95% on room air but subsequently was placed on supplemental oxygen.  He was tachypneic at 26/minute.  He was afebrile at 97.7.  Initial workup notable for BNP of 832, troponin of 30, WBC 17.1, hemoglobin 14.8, platelets 130, potassium 4.2, glucose 117, creatinine 1.07 and GFR above 60.  Chest x-ray demonstrated small left pleural effusion.  Patient started on Lasix.  TRH contacted for admission.  Review of Systems: As mentioned in the history of present illness. All other systems reviewed and are negative.  Past Medical History:  Diagnosis Date   Bladder neck obstruction    Cataracts, bilateral    Chronic  lymphocytic leukemia (CLL), B-cell    Lambda positive B-cell CLL   Gout    Hypercholesterolemia    Hypertension    Lymphocytosis    Sleep apnea    Thrombocytopenia    Past Surgical History:  Procedure Laterality Date   COLONOSCOPY WITH PROPOFOL N/A 09/10/2015   Procedure: COLONOSCOPY WITH PROPOFOL;  Surgeon: Brent Miniumarren Wohl, MD;  Location: [facility] ENDOSCOPY;  Service: Endoscopy;  Laterality: N/A;   PROSTATE SURGERY  2008   Social History:  reports that he has never smoked. He has never used smokeless tobacco. He reports that he does not drink alcohol and does not use drugs.  Allergies  Allergen Reactions   Penicillins Other (See Comments)    Has patient had a PCN reaction causing immediate rash, facial/tongue/throat swelling, SOB or lightheadedness with hypotension: Unknown Has patient had a PCN reaction causing severe rash involving mucus membranes or skin necrosis: Unknown Has patient had a PCN reaction that required hospitalization: Unknown Has patient had a PCN reaction occurring within the last 10 years: Unknown If all of the above answers are "NO", then may proceed with Cephalosporin use.   Atorvastatin Rash    Other reaction(s): Muscle pain Other reaction(s): Muscle pain   Terazosin Rash    Other reaction(s): Feeling faint Other reaction(s): Feeling faint    Family History  Problem Relation Age of Onset   Lung cancer Mother [id number]       deceased   Brain cancer Son [id number]       pt's only son   Polycythemia Brother  half brother   Prostate cancer Brother     Prior to Admission medications   Medication Sig Start Date End Date Taking? Authorizing Provider  amLODipine (NORVASC) 2.5 MG tablet Take 2.5 mg by mouth daily.    [provider]  aspirin 81 MG EC tablet Take 1 tablet by mouth daily. 02/07/20   [provider]  carvedilol (COREG) 3.125 MG tablet Take 1 tablet by mouth every 12 (twelve) hours. 02/07/20   [provider]  doxycycline (VIBRAMYCIN)  100 MG capsule Take 1 capsule (100 mg total) by mouth 2 (two) times daily. Patient not taking: Reported on 11/11/2022 01/10/22   Becky Augusta, NP  ezetimibe (ZETIA) 10 MG tablet TAKE (1) TABLET BY MOUTH EVERY DAY 01/22/21   Duanne Limerick, MD  ezetimibe (ZETIA) 10 MG tablet Take 1 tablet by mouth daily. 11/03/22   [provider]  furosemide (LASIX) 40 MG tablet Take 1 tablet (40 mg total) by mouth daily. [email or web address] 11/21/19 11/11/22  Thomasenia Bottoms, MD  lisinopril (ZESTRIL) 20 MG tablet TAKE (1) TABLET BY MOUTH DAILY. 11/21/19   Thomasenia Bottoms, MD  nitroGLYCERIN (NITROSTAT) 0.4 MG SL tablet Place 1 tablet (0.4 mg total) under the tongue every 5 (five) minutes as needed for chest pain. Patient not taking: Reported on 05/11/2022 11/21/19   Thomasenia Bottoms, MD  tamsulosin (FLOMAX) 0.4 MG CAPS capsule TAKE (1) CAPSULE BY MOUTH EVERY DAY Patient not taking: Reported on 05/11/2022 01/06/21   Duanne Limerick, MD    Physical Exam: Vitals:   02/09/23 1658  BP: (!) 177/100  Pulse: 65  Resp: (!) 26  Temp: 97.7 F (36.5 C)  TempSrc: Oral  SpO2: 95%   Physical Exam Vitals and nursing note reviewed.  Constitutional:      General: He is not in acute distress.    Appearance: He is obese. He is not toxic-appearing.  HENT:     Head: Normocephalic and atraumatic.  Eyes:     Extraocular Movements: Extraocular movements intact.     Pupils: Pupils are equal, round, and reactive to light.  Neck:     Vascular: JVD present.  Cardiovascular:     Rate and Rhythm: Normal rate and regular rhythm.     Heart sounds: No murmur heard. Pulmonary:     Effort: Pulmonary effort is normal. Tachypnea present. No accessory muscle usage.     Breath sounds: Decreased breath sounds (Left base) and rales (Bilateral lower lung fields) present. No wheezing or rhonchi.  Musculoskeletal:     Right lower leg: 2+ Edema present.     Left lower leg: 3+ Edema present.  Skin:    General: Skin is warm and dry.  Neurological:      General: No focal deficit present.     Mental Status: He is alert and oriented to person, place, and time.  Psychiatric:        Mood and Affect: Mood normal.        Behavior: Behavior normal.    Data Reviewed: CBC with WBC of 17.1, hemoglobin 14.8, and platelets of 130 BMP with sodium of 139, potassium 4.2, bicarb 26, glucose 117, BUN 16, creatinine 1.07, and GFR above 60 BNP elevated 832 Troponin elevated at 30  EKG personally reviewed.  Ventricular paced with rate of 74.  DG Chest 2 View  Result Date: 02/09/2023 CLINICAL DATA:  Shortness of breath. EXAM: CHEST - 2 VIEW COMPARISON:  November 21, 2019. FINDINGS: Stable cardiomegaly. Left-sided pacemaker is unchanged. Right lung is  clear. Mild left basilar atelectasis or infiltrate is noted with small left pleural effusion. Bony thorax is unremarkable. IMPRESSION: Mild left basilar subsegmental atelectasis or infiltrate is noted with small left pleural effusion. Aortic Atherosclerosis (ICD10-I70.0). Electronically Signed   By: Lupita Raider M.D.   On: 02/09/2023 17:46    Results are pending, will review when available.  Assessment and Plan:  * Acute on chronic heart failure Patient is presenting with gradually worsening heart failure symptoms including shortness of breath and lower extremity swelling with history of HFrEF with last EF of 45% in 2022.  Patient followed up with Dr. Gwen Pounds in December 2023 at which time he was instructed to continue 40 mg of Lasix daily.  Unclear if patient is currently taking it daily.  D-dimer was obtained due to an even lower extremity swelling, which patient states is chronic after a previous injury to the leg.  D-dimer only minimally elevated at 0.54.  Will not obtain CTA at this time.  - Telemetry monitoring - Start Lasix 80 mg IV daily - Echocardiogram ordered - CT chest without contrast to evaluate effusion given prior need for thoracentesis - Strict in and out - Daily weights - Continue home  GDMT  CKD (chronic kidney disease), stage III Renal function currently at baseline with GFR above 60.  Will monitor closely while IV diuresing.  - Daily BMP - Strict in and out  HTN (hypertension) Blood pressure above goal at this time, however asymptomatic.  - Restart home antihypertensives  OSA (obstructive sleep apnea) - Continue CPAP at bedtime  CLL (chronic lymphocytic leukemia) Elevated WBC consistent with patient's prior baseline.  He follows up with Dr. [name] with last visit in January 2024.   Chronic atrial fibrillation (HCC) Not on anticoagulation.  EKG demonstrates ventricular pacing.  Advance Care Planning:   Code Status: Full Code verified by patient  Consults: None  Family Communication: Patient's daughter-in-law updated at bedside  Severity of Illness: The appropriate patient status for this patient is OBSERVATION. Observation status is judged to be reasonable and necessary in order to provide the required intensity of service to ensure the patient's safety. The patient's presenting symptoms, physical exam findings, and initial radiographic and laboratory data in the context of their medical condition is felt to place them at decreased risk for further clinical deterioration. Furthermore, it is anticipated that the patient will be medically stable for discharge from the hospital within 2 midnights of admission.   Author: Verdene Lennert, MD 02/09/2023 7:16 PM  For on call review [email or web address].

## 2023-02-10 ENCOUNTER — Observation Stay (HOSPITAL_COMMUNITY)
Admit: 2023-02-10 | Discharge: 2023-02-10 | Disposition: A | Discharge status: Home / Self Care | Payer: Medicare Other | Attending: Internal Medicine | Admitting: Internal Medicine

## 2023-02-10 ENCOUNTER — Encounter: Payer: Self-pay | Admitting: Internal Medicine

## 2023-02-10 DIAGNOSIS — I130 Hypertensive heart and chronic kidney disease with heart failure and stage 1 through stage 4 chronic kidney disease, or unspecified chronic kidney disease: Secondary | ICD-10-CM | POA: Diagnosis present

## 2023-02-10 DIAGNOSIS — Z808 Family history of malignant neoplasm of other organs or systems: Secondary | ICD-10-CM | POA: Diagnosis not present

## 2023-02-10 DIAGNOSIS — Z880 Allergy status to penicillin: Secondary | ICD-10-CM | POA: Diagnosis not present

## 2023-02-10 DIAGNOSIS — I5023 Acute on chronic systolic (congestive) heart failure: Secondary | ICD-10-CM | POA: Diagnosis present

## 2023-02-10 DIAGNOSIS — I5032 Chronic diastolic (congestive) heart failure: Secondary | ICD-10-CM | POA: Diagnosis present

## 2023-02-10 DIAGNOSIS — Z8042 Family history of malignant neoplasm of prostate: Secondary | ICD-10-CM | POA: Diagnosis not present

## 2023-02-10 DIAGNOSIS — Z950 Presence of cardiac pacemaker: Secondary | ICD-10-CM | POA: Diagnosis not present

## 2023-02-10 DIAGNOSIS — Z7982 Long term (current) use of aspirin: Secondary | ICD-10-CM | POA: Diagnosis not present

## 2023-02-10 DIAGNOSIS — Z888 Allergy status to other drugs, medicaments and biological substances status: Secondary | ICD-10-CM | POA: Diagnosis not present

## 2023-02-10 DIAGNOSIS — I482 Chronic atrial fibrillation, unspecified: Secondary | ICD-10-CM | POA: Diagnosis present

## 2023-02-10 DIAGNOSIS — N1831 Chronic kidney disease, stage 3a: Secondary | ICD-10-CM | POA: Diagnosis present

## 2023-02-10 DIAGNOSIS — R0603 Acute respiratory distress: Secondary | ICD-10-CM | POA: Diagnosis present

## 2023-02-10 DIAGNOSIS — C9110 Chronic lymphocytic leukemia of B-cell type not having achieved remission: Secondary | ICD-10-CM | POA: Diagnosis present

## 2023-02-10 DIAGNOSIS — Z801 Family history of malignant neoplasm of trachea, bronchus and lung: Secondary | ICD-10-CM | POA: Diagnosis not present

## 2023-02-10 DIAGNOSIS — G4733 Obstructive sleep apnea (adult) (pediatric): Secondary | ICD-10-CM | POA: Diagnosis present

## 2023-02-10 DIAGNOSIS — I7121 Aneurysm of the ascending aorta, without rupture: Secondary | ICD-10-CM | POA: Diagnosis present

## 2023-02-10 DIAGNOSIS — I5043 Acute on chronic combined systolic (congestive) and diastolic (congestive) heart failure: Secondary | ICD-10-CM | POA: Diagnosis present

## 2023-02-10 DIAGNOSIS — E7800 Pure hypercholesterolemia, unspecified: Secondary | ICD-10-CM | POA: Diagnosis present

## 2023-02-10 DIAGNOSIS — Z79899 Other long term (current) drug therapy: Secondary | ICD-10-CM | POA: Diagnosis not present

## 2023-02-10 DIAGNOSIS — R0902 Hypoxemia: Secondary | ICD-10-CM | POA: Diagnosis present

## 2023-02-10 DIAGNOSIS — M109 Gout, unspecified: Secondary | ICD-10-CM | POA: Diagnosis present

## 2023-02-10 LAB — BASIC METABOLIC PANEL
Anion gap: 7 (ref 5–15)
BUN: 15 mg/dL (ref 8–23)
CO2: 27 mmol/L (ref 22–32)
Calcium: 8.5 mg/dL — ABNORMAL LOW (ref 8.9–10.3)
Chloride: 106 mmol/L (ref 98–111)
Creatinine, Ser: 1.1 mg/dL (ref 0.61–1.24)
GFR, Estimated: >60 mL/min (ref >60)
Glucose, Bld: 102 mg/dL — ABNORMAL HIGH (ref 70–99)
Potassium: 3.6 mmol/L (ref 3.5–5.1)
Sodium: 140 mmol/L (ref 135–145)

## 2023-02-10 LAB — ECHOCARDIOGRAM COMPLETE
AR max vel: 1.87 cm2
AV Area VTI: 1.96 cm2
AV Area mean vel: 1.6 cm2
AV Mean grad: 8.7 mmHg
AV Peak grad: 14.7 mmHg
Ao pk vel: 1.92 m/s
Area-P 1/2: 6.77 cm2
S' Lateral: 3.3 cm

## 2023-02-10 LAB — MAGNESIUM: Magnesium: 2.2 mg/dL (ref 1.7–2.4)

## 2023-02-10 NOTE — Progress Notes (Signed)
Progress Note   Patient: Brent Hoover FBP:[id number] DOB: 07-10-1930 DOA: 02/09/2023     0 DOS: the patient was seen and examined on 02/10/2023    Subjective:  Patient seen and examined at bedside this morning Admits to improvement in respiratory function however still having some difficulty breathing Denies chest pain cough abdominal pain According to him he has been able to pee out about 5 bottles of urine overnight  Brief hospital course: Brent Hoover is a 89 y.o. male with medical history significant of chronic systolic heart failure with last EF of 40-45%, chronic lymphocytic leukemia previously treated with Gazyva, mild aortic valve stenosis, bradycardia s/p PPM, atrial fibrillation not on AC, OSA on CPAP, hypertension, hyperlipidemia, who presents to the ED with shortness of breath.   Brent Hoover has been experiencing gradually worsening shortness of breath that has been going on for couple months.  Due to the progressive nature of the symptoms, his family members insisted that he comes in.  He endorses worsening bilateral lower extremity swelling.  He endorses night sweats but denies any fever, chills, nausea, vomiting, diarrhea, chest pain or palpitations.  He has noted that his abdomen seems more tight lately.  He has gained approximately 15 pounds in the last 2 weeks.  He is uncertain if he is taking furosemide daily.   ED course: On arrival to the ED, patient was hypertensive at 177/100 with heart rate of 65.  He was saturating at 95% on room air but subsequently was placed on supplemental oxygen.  He was tachypneic at 26/minute.  He was afebrile at 97.7.  Initial workup notable for BNP of 832, troponin of 30, WBC 17.1, hemoglobin 14.8, platelets 130, potassium 4.2, glucose 117, creatinine 1.07 and GFR above 60.  Chest x-ray demonstrated small left pleural effusion.  Patient started on Lasix.  TRH contacted for admission.    Assessment and Plan: Acute on chronic heart  failure Patient is presenting with gradually worsening heart failure symptoms including shortness of breath and lower extremity swelling with history of HFrEF with last EF of 45% in 2022.  Patient followed up with Dr. Gwen Pounds in December 2023 at which time he was instructed to continue 40 mg of Lasix daily.  Unclear if patient is currently taking it daily.   D-dimer was obtained due to an even lower extremity swelling, which patient states is chronic after a previous injury to the leg.  D-dimer only minimally elevated at 0.54.  Will not obtain CTA at this time.   - Telemetry monitoring - Start Lasix 80 mg IV daily - Echocardiogram ordered - CT chest showed 4.8 cm ascending thoracic aortic aneurysm Minimal right pleural effusion as well as small left pleural effusion - Strict in and out - Daily weights - Continue home GDMT   Ascending thoracic aortic aneurysm Outpatient follow-up   CKD (chronic kidney disease), stage III Renal function currently at baseline with GFR above 60.  Will monitor closely while IV diuresing.   - Daily BMP - Strict in and out   HTN (hypertension) Blood pressure above goal at this time, however asymptomatic.   - Restart home antihypertensives   OSA (obstructive sleep apnea) - Continue CPAP at bedtime   CLL (chronic lymphocytic leukemia) Elevated WBC consistent with patient's prior baseline.  He follows up with Dr. [name] with last visit in January 2024.    Chronic atrial fibrillation (HCC) Not on anticoagulation.  EKG demonstrates ventricular pacing.   Advance Care Planning:   Code  Status: Full Code verified by patient   Consults: None   Physical Exam:  Appearance: He is obese. He is not toxic-appearing.  HENT:    Head: Normocephalic and atraumatic.  Eyes:     Extraocular Movements: Extraocular movements intact.  Cardiovascular:     Rate and Rhythm: Normal rate and regular rhythm.  Pulmonary: Decreased air entry at the bases Musculoskeletal:  Bilateral pitting edema in the lower extremities Neurological:     General: No focal deficit present.  Psychiatric:        Mood and Affect: Mood normal.        Data Reviewed: Culture results reviewed by me showed sodium 140 potassium 3.6 chloride 106 glucose 102  Family Communication: None present at bedside  Disposition: Status is: Inpatient Remains inpatient appropriate because: As patient continues to require IV diuresis in the setting of acute on chronic systolic congestive heart failure  Time spent: 45 minutes   Vitals:   02/10/23 0830 02/10/23 0845 02/10/23 1432 02/10/23 1500  BP: (!) 155/82 (!) 155/82  (!) 147/85  Pulse: (!) 59 60  64  Resp: 13 13  16   Temp:  97.7 F (36.5 C) (!) 97.4 F (36.3 C)   TempSrc:  Oral Oral   SpO2: 100% 100%  100%    Author: Loyce Dys, MD 02/10/2023 5:29 PM  For on call review [email or web address].

## 2023-02-10 NOTE — ED Notes (Signed)
Echo tech at bedside at this time  

## 2023-02-10 NOTE — Progress Notes (Signed)
*  PRELIMINARY RESULTS* Echocardiogram 2D Echocardiogram has been performed.  Cristela Blue 02/10/2023, 7:43 AM

## 2023-02-10 NOTE — ED Notes (Signed)
Pt visitor at bedside.

## 2023-02-11 DIAGNOSIS — I5043 Acute on chronic combined systolic (congestive) and diastolic (congestive) heart failure: Secondary | ICD-10-CM

## 2023-02-11 LAB — CBC WITH DIFFERENTIAL/PLATELET
Abs Immature Granulocytes: 0.03 10*3/uL (ref 0.00–0.07)
Basophils Absolute: 0.1 10*3/uL (ref 0.0–0.1)
Basophils Relative: 1 %
Eosinophils Absolute: 0.2 10*3/uL (ref 0.0–0.5)
Eosinophils Relative: 2 %
HCT: 44.3 % (ref 39.0–52.0)
Hemoglobin: 14.3 g/dL (ref 13.0–17.0)
Immature Granulocytes: 0 %
Lymphocytes Relative: 51 %
Lymphs Abs: 6.8 10*3/uL — ABNORMAL HIGH (ref 0.7–4.0)
MCH: 29.3 pg (ref 26.0–34.0)
MCHC: 32.3 g/dL (ref 30.0–36.0)
MCV: 90.8 fL (ref 80.0–100.0)
Monocytes Absolute: 0.9 10*3/uL (ref 0.1–1.0)
Monocytes Relative: 6 %
Neutro Abs: 5.4 10*3/uL (ref 1.7–7.7)
Neutrophils Relative %: 40 %
Platelets: 113 10*3/uL — ABNORMAL LOW (ref 150–400)
RBC: 4.88 MIL/uL (ref 4.22–5.81)
RDW: 14.5 % (ref 11.5–15.5)
Smear Review: NORMAL
WBC: 13.3 10*3/uL — ABNORMAL HIGH (ref 4.0–10.5)
nRBC: 0 % (ref 0.0–0.2)

## 2023-02-11 LAB — BASIC METABOLIC PANEL
Anion gap: 8 (ref 5–15)
BUN: 16 mg/dL (ref 8–23)
CO2: 28 mmol/L (ref 22–32)
Calcium: 8.6 mg/dL — ABNORMAL LOW (ref 8.9–10.3)
Chloride: 102 mmol/L (ref 98–111)
Creatinine, Ser: 1.16 mg/dL (ref 0.61–1.24)
GFR, Estimated: 60 mL/min — ABNORMAL LOW (ref >60)
Glucose, Bld: 101 mg/dL — ABNORMAL HIGH (ref 70–99)
Potassium: 3.6 mmol/L (ref 3.5–5.1)
Sodium: 138 mmol/L (ref 135–145)

## 2023-02-11 LAB — PATHOLOGIST SMEAR REVIEW

## 2023-02-11 NOTE — Care Management Important Message (Signed)
Important Message  Patient Details  Name: Brent Hoover MRN: [id number] Date of Birth: 14-Sep-1930   Medicare Important Message Given:  N/A - LOS <3 / Initial given by admissions     [name] 02/11/2023, 2:36 PM

## 2023-02-11 NOTE — Progress Notes (Signed)
Progress Note   Patient: Brent Hoover VPC:[id number] DOB: 1930/10/11 DOA: 02/09/2023     1 DOS: the patient was seen and examined on 02/11/2023    Subjective:  Patient seen and examined at bedside this morning Respiratory function continues to improve Patient diuresing very well Renal function also improving   Brief hospital course: Brent Hoover is a 89 y.o. male with medical history significant of chronic systolic heart failure with last EF of 40-45%, chronic lymphocytic leukemia previously treated with Gazyva, mild aortic valve stenosis, bradycardia s/p PPM, atrial fibrillation not on AC, OSA on CPAP, hypertension, hyperlipidemia, who presents to the ED with shortness of breath.   Brent Hoover has been experiencing gradually worsening shortness of breath that has been going on for couple months.  Due to the progressive nature of the symptoms, his family members insisted that he comes in.  He endorses worsening bilateral lower extremity swelling.  He endorses night sweats but denies any fever, chills, nausea, vomiting, diarrhea, chest pain or palpitations.  He has noted that his abdomen seems more tight lately.  He has gained approximately 15 pounds in the last 2 weeks.  He is uncertain if he is taking furosemide daily.     Assessment and Plan:   Subjective:  Patient seen and examined at bedside this morning Admits to improvement in respiratory function however still having some difficulty breathing Denies chest pain cough abdominal pain According to him he has been able to pee out about 5 bottles of urine overnight   Brief hospital course: Brent Hoover is a 89 y.o. male with medical history significant of chronic systolic heart failure with last EF of 40-45%, chronic lymphocytic leukemia previously treated with Gazyva, mild aortic valve stenosis, bradycardia s/p PPM, atrial fibrillation not on AC, OSA on CPAP, hypertension, hyperlipidemia, who presents to the ED with shortness of  breath.   Brent Hoover has been experiencing gradually worsening shortness of breath that has been going on for couple months.  Due to the progressive nature of the symptoms, his family members insisted that he comes in.  He endorses worsening bilateral lower extremity swelling.  He endorses night sweats but denies any fever, chills, nausea, vomiting, diarrhea, chest pain or palpitations.  He has noted that his abdomen seems more tight lately.  He has gained approximately 15 pounds in the last 2 weeks.  He is uncertain if he is taking furosemide daily.   ED course: On arrival to the ED, patient was hypertensive at 177/100 with heart rate of 65.  He was saturating at 95% on room air but subsequently was placed on supplemental oxygen.  He was tachypneic at 26/minute.  He was afebrile at 97.7.  Initial workup notable for BNP of 832, troponin of 30, WBC 17.1, hemoglobin 14.8, platelets 130, potassium 4.2, glucose 117, creatinine 1.07 and GFR above 60.  Chest x-ray demonstrated small left pleural effusion.  Patient started on Lasix.  TRH contacted for admission.     Assessment and Plan: Acute on chronic systolic and diastolic congestive heart failure Patient is presenting with gradually worsening heart failure symptoms including shortness of breath and lower extremity swelling with history of HFrEF with last EF of 45% in 2022. The ejection fraction has improved from the echo done on 02/08/2022 with a EF of 50 to 55%. Echo was consistent with grade 2 diastolic dysfunction  - Telemetry monitoring - Continue IV Lasix - Echocardiogram ordered - CT chest showed 4.8 cm ascending thoracic aortic aneurysm  Minimal right pleural effusion as well as small left pleural effusion - Strict in and out - Daily weights - Continue home GDMT Continue lisinopril, carvedilol Outpatient follow-up with cardiology   Ascending thoracic aortic aneurysm Outpatient follow-up   CKD (chronic kidney disease), stage IIIa Renal  function currently at baseline with GFR above 60.  Will monitor closely while IV diuresing.   - Daily BMP - Strict in and out   HTN (hypertension) Blood pressure above goal at this time, however asymptomatic.   - Restart home antihypertensives   OSA (obstructive sleep apnea) - Continue CPAP at bedtime   CLL (chronic lymphocytic leukemia) Elevated WBC consistent with patient's prior baseline.  He follows up with Dr. [name] with last visit in January 2024.    Chronic atrial fibrillation (HCC) Not on anticoagulation.  EKG demonstrates ventricular pacing.   Advance Care Planning:   Code Status: Full Code verified by patient   Consults: None     Physical Exam:  Appearance: He is obese. He is not toxic-appearing.  HENT:    Head: Normocephalic and atraumatic.  Eyes:     Extraocular Movements: Extraocular movements intact.  Cardiovascular:     Rate and Rhythm: Normal rate and regular rhythm.  Pulmonary: Decreased air entry at the bases Musculoskeletal: Bilateral pitting edema in the lower extremities Neurological:     General: No focal deficit present.  Psychiatric:        Mood and Affect: Mood normal.           Data Reviewed: Culture results reviewed by me showed sodium 140 potassium 3.6 chloride 106 glucose 102   Family Communication: None present at bedside   Disposition: Status is: Inpatient Remains inpatient appropriate because: As patient continues to require IV diuresis in the setting of acute on chronic systolic congestive heart failure   Time spent: 40 minutes      Vitals:   02/11/23 0400 02/11/23 0500 02/11/23 0829 02/11/23 1222  BP:   (!) 153/80 124/69  Pulse:   62 60  Resp: 16 19 20 18   Temp:   97.8 F (36.6 C) 98.1 F (36.7 C)  TempSrc:      SpO2:   95% 94%  Weight:  105.2 kg      Author: Loyce Dys, MD 02/11/2023 2:10 PM  For on call review [email or web address].

## 2023-02-11 NOTE — Discharge Instructions (Signed)

## 2023-02-12 DIAGNOSIS — I5043 Acute on chronic combined systolic (congestive) and diastolic (congestive) heart failure: Secondary | ICD-10-CM | POA: Diagnosis not present

## 2023-02-12 LAB — CBC WITH DIFFERENTIAL/PLATELET
Abs Immature Granulocytes: 0.04 10*3/uL (ref 0.00–0.07)
Basophils Absolute: 0.1 10*3/uL (ref 0.0–0.1)
Basophils Relative: 1 %
Eosinophils Absolute: 0.2 10*3/uL (ref 0.0–0.5)
Eosinophils Relative: 1 %
HCT: 44.5 % (ref 39.0–52.0)
Hemoglobin: 14.5 g/dL (ref 13.0–17.0)
Immature Granulocytes: 0 %
Lymphocytes Relative: 49 %
Lymphs Abs: 7.5 10*3/uL — ABNORMAL HIGH (ref 0.7–4.0)
MCH: 29.7 pg (ref 26.0–34.0)
MCHC: 32.6 g/dL (ref 30.0–36.0)
MCV: 91.2 fL (ref 80.0–100.0)
Monocytes Absolute: 1.1 10*3/uL — ABNORMAL HIGH (ref 0.1–1.0)
Monocytes Relative: 7 %
Neutro Abs: 6.3 10*3/uL (ref 1.7–7.7)
Neutrophils Relative %: 42 %
Platelets: 110 10*3/uL — ABNORMAL LOW (ref 150–400)
RBC: 4.88 MIL/uL (ref 4.22–5.81)
RDW: 14.5 % (ref 11.5–15.5)
Smear Review: DECREASED
WBC: 15.1 10*3/uL — ABNORMAL HIGH (ref 4.0–10.5)
nRBC: 0 % (ref 0.0–0.2)

## 2023-02-12 LAB — BASIC METABOLIC PANEL
Anion gap: 8 (ref 5–15)
BUN: 22 mg/dL (ref 8–23)
CO2: 27 mmol/L (ref 22–32)
Calcium: 8.1 mg/dL — ABNORMAL LOW (ref 8.9–10.3)
Chloride: 99 mmol/L (ref 98–111)
Creatinine, Ser: 1.08 mg/dL (ref 0.61–1.24)
GFR, Estimated: >60 mL/min (ref >60)
Glucose, Bld: 106 mg/dL — ABNORMAL HIGH (ref 70–99)
Potassium: 3.4 mmol/L — ABNORMAL LOW (ref 3.5–5.1)
Sodium: 134 mmol/L — ABNORMAL LOW (ref 135–145)

## 2023-02-12 LAB — GLUCOSE, CAPILLARY: Glucose-Capillary: 110 mg/dL — ABNORMAL HIGH (ref 70–99)

## 2023-02-12 MED ORDER — POTASSIUM CHLORIDE CRYS ER 20 MEQ PO TBCR
40.0000 meq | EXTENDED_RELEASE_TABLET | Freq: Once | ORAL | Status: AC
  Administered 2023-02-12: 40 meq via ORAL
  Filled 2023-02-12: qty 2

## 2023-02-12 NOTE — Evaluation (Signed)
Physical Therapy Evaluation Patient Details Name: Brent Hoover MRN: [id number] DOB: 06/09/1930 Today's Date: 02/12/2023  History of Present Illness  Brent Hoover is a 89 y.o. male with medical history significant of chronic systolic heart failure with last EF of 40-45%, chronic lymphocytic leukemia previously treated with Gazyva, mild aortic valve stenosis, bradycardia s/p PPM, atrial fibrillation not on AC, OSA on CPAP, hypertension, hyperlipidemia, who presents to the ED with shortness of breath.   Clinical Impression  Pt admitted with above diagnosis. Pt currently with functional limitations due to the deficits listed below (see PT Problem List). Pt received upright in bed agreeable to PT services. Reports at baseline he lives alone and is independent with gait and ADL's. Gets some support from grand son who lives with him with IADL's and gets meals on wheels.   To date, moving close to baseline exiting bed and standing independently and ambulating with step through pattern to bathroom to attempt BM. Pt unsuccessful but independent with peri care and STS from lowered toilet. Supervision for hand hygiene at sink returning to recliner declining further gait due to pt on diuretics requesting to be close to bathroom. Pt does demo DOE with short gait bouts requiring education on PLB and diaphragmatic breathing to recover. HR and SPO2 WNL. Pt with all needs in reach. Pt would benefit from additional PT services to address acute deficits in endurance with ambulation to return to PLOF.      Recommendations for follow up therapy are one component of a multi-disciplinary discharge planning process, led by the attending physician.  Recommendations may be updated based on patient status, additional functional criteria and insurance authorization.     Assistance Recommended at Discharge PRN  Patient can return home with the following  Assist for transportation;Assistance with cooking/housework;Help  with stairs or ramp for entrance    Equipment Recommendations None recommended by PT  Recommendations for Other Services       Functional Status Assessment Patient has had a recent decline in their functional status and demonstrates the ability to make significant improvements in function in a reasonable and predictable amount of time.     Precautions / Restrictions Precautions Precautions: None Restrictions Weight Bearing Restrictions: No      Mobility  Bed Mobility Overal bed mobility: Independent               Patient Response: Cooperative  Transfers Overall transfer level: Independent Equipment used: None                    Ambulation/Gait Ambulation/Gait assistance: Supervision Gait Distance (Feet): 45 Feet Assistive device: None Gait Pattern/deviations: Step-through pattern, Decreased step length - right, Decreased step length - left          Stairs            Wheelchair Mobility    Modified Rankin (Stroke Patients Only)       Balance Overall balance assessment: Needs assistance Sitting-balance support: Bilateral upper extremity supported, Feet supported Sitting balance-Leahy Scale: Normal     Standing balance support: No upper extremity supported Standing balance-Leahy Scale: Fair                               Pertinent Vitals/Pain Pain Assessment Pain Assessment: No/denies pain    Home Living Family/patient expects to be discharged to:: Private residence Living Arrangements: Other relatives (grandson) Available Help at Discharge: Family;Available PRN/intermittently Type of Home:  House Home Access: Stairs to enter Entrance Stairs-Rails: Right;Left;Can reach both Secretary/administrator of Steps: 4   Home Layout: Laundry or work area in basement;Able to live on main level with bedroom/bathroom Home Equipment: Agricultural consultant (2 wheels)      Prior Function Prior Level of Function : Independent/Modified  Independent               ADLs Comments: Lucila [place]  assists with errands, meals. Pt gets meals on wheels     Hand Dominance        Extremity/Trunk Assessment   Upper Extremity Assessment Upper Extremity Assessment: Overall WFL for tasks assessed    Lower Extremity Assessment Lower Extremity Assessment: Generalized weakness       Communication   Communication: No difficulties  Cognition Arousal/Alertness: Awake/alert Behavior During Therapy: WFL for tasks assessed/performed Overall Cognitive Status: Within Functional Limits for tasks assessed                                          General Comments General comments (skin integrity, edema, etc.): SPO2 > 90% at rest and post mobility    Exercises Other Exercises Other Exercises: Role of PT in acute setting, energy conservation techniques, PLB, benefits of HHPT services   Assessment/Plan    PT Assessment Patient needs continued PT services  PT Problem List Decreased strength;Decreased activity tolerance       PT Treatment Interventions Balance training;Gait training;Stair training;Functional mobility training;Patient/family education;Therapeutic activities;Therapeutic exercise    PT Goals (Current goals can be found in the Care Plan section)  Acute Rehab PT Goals Patient Stated Goal: to go home PT Goal Formulation: With patient Time For Goal Achievement: 02/26/23 Potential to Achieve Goals: Good    Frequency Min 2X/week     Co-evaluation               AM-PAC PT "6 Clicks" Mobility  Outcome Measure Help needed turning from your back to your side while in a flat bed without using bedrails?: None Help needed moving from lying on your back to sitting on the side of a flat bed without using bedrails?: None Help needed moving to and from a bed to a chair (including a wheelchair)?: A Little Help needed standing up from a chair using your arms (e.g., wheelchair or bedside chair)?: A  Little Help needed to walk in hospital room?: A Little Help needed climbing 3-5 steps with a railing? : A Lot 6 Click Score: 19    End of Session   Activity Tolerance: Patient tolerated treatment well Patient left: in chair;with call bell/phone within reach;with chair alarm set Nurse Communication: Mobility status PT Visit Diagnosis: Muscle weakness (generalized) (M62.81)    Time: 1610-9604 PT Time Calculation (min) (ACUTE ONLY): 23 min   Charges:     PT Treatments $Therapeutic Activity: 8-22 mins       Toron Bowring M. Fairly IV, PT, DPT Physical Therapist-   [facility]  02/12/2023, 12:48 PM

## 2023-02-12 NOTE — Discharge Summary (Signed)
Physician Discharge Summary   Patient: Brent Hoover MRN: [id number] DOB: 20-Dec-1929  Admit date:     02/09/2023  Discharge date: 02/12/23  Discharge Physician: Loyce Dys   PCP: Pcp, No    Discharge Diagnoses: Acute on chronic systolic and diastolic congestive heart failure Chronic ascending thoracic aortic aneurysm-patient follows up at [facility] CKD (chronic kidney disease), stage IIIa HTN (hypertension) OSA (obstructive sleep apnea) CLL (chronic lymphocytic leukemia) Chronic atrial fibrillation [facility])  Hospital Course: Brent Hoover is a 89 y.o. male with medical history significant of chronic systolic heart failure with last EF of 40-45%, chronic lymphocytic leukemia previously treated with Gazyva, mild aortic valve stenosis, bradycardia s/p PPM, atrial fibrillation not on AC, OSA on CPAP, hypertension, hyperlipidemia, who presents to the ED with shortness of breath.   Brent Hoover has been experiencing gradually worsening shortness of breath that has been going on for couple months.  Due to the progressive nature of the symptoms, his family members insisted that he comes in.  He endorses worsening bilateral lower extremity swelling.  Patient was subsequently admitted for acute exacerbation of congestive heart failure requiring IV diuresis.  Patient's swelling improved significantly and currently patient is back to baseline.  He was able to work with physical therapist who recommended home PT. patient is therefore being discharged today to follow-up with primary care physician as well as his cardiologist  Consultants: None Procedures performed: None Disposition: Home health Diet recommendation:  Discharge Diet Orders (From admission, onward)     Start     Ordered   02/12/23 0000  Diet - low sodium heart healthy        02/12/23 1309           Cardiac diet DISCHARGE MEDICATION: Allergies as of 02/12/2023       Reactions   Penicillins Other (See Comments)   Has patient had  a PCN reaction causing immediate rash, facial/tongue/throat swelling, SOB or lightheadedness with hypotension: Unknown Has patient had a PCN reaction causing severe rash involving mucus membranes or skin necrosis: Unknown Has patient had a PCN reaction that required hospitalization: Unknown Has patient had a PCN reaction occurring within the last 10 years: Unknown If all of the above answers are "NO", then may proceed with Cephalosporin use.   Atorvastatin Rash   Other reaction(s): Muscle pain Other reaction(s): Muscle pain   Terazosin Rash   Other reaction(s): Feeling faint Other reaction(s): Feeling faint        Medication List     STOP taking these medications    doxycycline 100 MG capsule Commonly known as: VIBRAMYCIN   tamsulosin 0.4 MG Caps capsule Commonly known as: FLOMAX       TAKE these medications    amLODipine 2.5 MG tablet Commonly known as: NORVASC Take 2.5 mg by mouth daily.   aspirin EC 81 MG tablet Take 1 tablet by mouth daily.   carvedilol 3.125 MG tablet Commonly known as: COREG Take 1 tablet by mouth every 12 (twelve) hours.   ezetimibe 10 MG tablet Commonly known as: ZETIA Take 1 tablet by mouth daily.   furosemide 40 MG tablet Commonly known as: LASIX Take 1 tablet (40 mg total) by mouth daily. kowalski   lisinopril 20 MG tablet Commonly known as: ZESTRIL TAKE (1) TABLET BY MOUTH DAILY.   nitroGLYCERIN 0.4 MG SL tablet Commonly known as: NITROSTAT Place 1 tablet (0.4 mg total) under the tongue every 5 (five) minutes as needed for chest pain.  Discharge Exam: Filed Weights   02/11/23 0500 02/12/23 0353  Weight: 105.2 kg 105.2 kg   Appearance: He is obese. He is not toxic-appearing.  HENT:    Head: Normocephalic and atraumatic.  Eyes:     Extraocular Movements: Extraocular movements intact.  Cardiovascular:     Rate and Rhythm: Normal rate and regular rhythm.  Pulmonary: Crackles improved Musculoskeletal: Bilateral  pitting edema in the lower extremities much improved neurological:     General: No focal deficit present.  Psychiatric:        Mood and Affect: Mood normal.      Condition at discharge: good     Discharge time spent: greater than 30 minutes.  Signed: Loyce Dys, MD [facility] 02/12/2023

## 2023-02-12 NOTE — TOC Progression Note (Signed)
Transition of Care [facility]) - Progression Note    Patient Details  Name: Brent Hoover MRN: [id number] Date of Birth: [birth year]  [facility] CM/SW Contact  Truddie Hidden, RN Phone Number: 02/12/2023, 12:26 PM  Clinical Narrative:    Spoke with patient and his grandson regarding therapy recommendation. He is agreeable to [facility] PT. He nor his grandson have a preference of an agency. They are agreeable to hospital preferred providers. His grandson was advised the [facility] agency would contact them directly at discharge to scheduled the start of care.   Referral sent and accepted by Barbara Cower from [facility].         Expected Discharge Plan and Services                                               Social Determinants of Health (SDOH) Interventions SDOH Screenings   Food Insecurity: No Food Insecurity (02/10/2023)  Housing: Low Risk  (02/10/2023)  Transportation Needs: No Transportation Needs (02/10/2023)  Utilities: Not At Risk (02/10/2023)  Depression (PHQ2-9): Low Risk  (01/16/2020)  Tobacco Use: Low Risk  (02/10/2023)    Readmission Risk Interventions     No data to display

## 2023-02-16 NOTE — Progress Notes (Unsigned)
   Patient ID: Brent Hoover, male    DOB: 22-Jan-1930, 89 y.o.   MRN: [id number]  Primary cardiologist: Minda Ditto, PA (last seen 12/23; returns 06/24) PCP: to be established with Tillie Fantasia, PA 05/24  HPI  [name] is a 89 y/o male with a history of  Echo 02/10/23: EF 50-55% with severe LVH/ LAE Echo 01/29/21: EF 45% with mild LVH & moderate valvular regurgitation  Admitted 02/09/23 due to worsening SOB over the last several months along with worsening pedal edema. IV diuresed.   He presents today for his initial HF visit with a chief complaint of   Review of Systems    Physical Exam    Assessment & Plan:  1: Chronic heart failure with preserved ejection fraction- - NYHA class  - Echo 02/10/23: EF 50-55% with severe LVH/ LAE. Echo 01/29/21: EF 45% with mild LVH & moderate valvular regurgitation - BNP 02/09/23 was 832.3  2: HTN w/ CKD- - BP - to see PCP [name]) 03/06/23 - BMP 02/12/23 showed sodium 134, potassium 3.4, creatinine 1.08 & GFR >60  3: Atrial fibrillation- - PPM presents - saw cardiology Madaline Guthrie) 12/23  4: Chronic ascending thoracic aortic aneurysm- - chest CT 02/09/23 showed 4.8cm aneurysm  5: CLL- - previously treated with [name]  - saw oncology [name]) 01/24  6: OSA- - wearing CPAP nightly

## 2023-02-17 ENCOUNTER — Ambulatory Visit (HOSPITAL_BASED_OUTPATIENT_CLINIC_OR_DEPARTMENT_OTHER): Payer: Medicare Other | Admitting: Family

## 2023-02-17 ENCOUNTER — Other Ambulatory Visit
Admission: RE | Admit: 2023-02-17 | Discharge: 2023-02-17 | Disposition: A | Discharge status: Home / Self Care | Payer: Medicare Other | Attending: Family | Admitting: Family

## 2023-02-17 ENCOUNTER — Encounter: Payer: Self-pay | Admitting: Family

## 2023-02-17 VITALS — BP 126/79 | HR 80 | Wt 231.0 lb

## 2023-02-17 DIAGNOSIS — H269 Unspecified cataract: Secondary | ICD-10-CM | POA: Diagnosis not present

## 2023-02-17 DIAGNOSIS — I7121 Aneurysm of the ascending aorta, without rupture: Secondary | ICD-10-CM

## 2023-02-17 DIAGNOSIS — I5032 Chronic diastolic (congestive) heart failure: Secondary | ICD-10-CM | POA: Insufficient documentation

## 2023-02-17 DIAGNOSIS — C9110 Chronic lymphocytic leukemia of B-cell type not having achieved remission: Secondary | ICD-10-CM

## 2023-02-17 DIAGNOSIS — I808 Phlebitis and thrombophlebitis of other sites: Secondary | ICD-10-CM | POA: Diagnosis not present

## 2023-02-17 DIAGNOSIS — I482 Chronic atrial fibrillation, unspecified: Secondary | ICD-10-CM | POA: Diagnosis not present

## 2023-02-17 DIAGNOSIS — N1831 Chronic kidney disease, stage 3a: Secondary | ICD-10-CM | POA: Diagnosis not present

## 2023-02-17 DIAGNOSIS — I5043 Acute on chronic combined systolic (congestive) and diastolic (congestive) heart failure: Secondary | ICD-10-CM | POA: Diagnosis not present

## 2023-02-17 DIAGNOSIS — I712 Thoracic aortic aneurysm, without rupture, unspecified: Secondary | ICD-10-CM | POA: Diagnosis not present

## 2023-02-17 DIAGNOSIS — E7800 Pure hypercholesterolemia, unspecified: Secondary | ICD-10-CM | POA: Diagnosis not present

## 2023-02-17 DIAGNOSIS — I130 Hypertensive heart and chronic kidney disease with heart failure and stage 1 through stage 4 chronic kidney disease, or unspecified chronic kidney disease: Secondary | ICD-10-CM | POA: Diagnosis not present

## 2023-02-17 DIAGNOSIS — Z7982 Long term (current) use of aspirin: Secondary | ICD-10-CM | POA: Diagnosis not present

## 2023-02-17 DIAGNOSIS — I10 Essential (primary) hypertension: Secondary | ICD-10-CM

## 2023-02-17 DIAGNOSIS — M109 Gout, unspecified: Secondary | ICD-10-CM | POA: Diagnosis not present

## 2023-02-17 DIAGNOSIS — G4733 Obstructive sleep apnea (adult) (pediatric): Secondary | ICD-10-CM

## 2023-02-17 DIAGNOSIS — I350 Nonrheumatic aortic (valve) stenosis: Secondary | ICD-10-CM | POA: Diagnosis not present

## 2023-02-17 LAB — CBC WITH DIFFERENTIAL/PLATELET
Abs Immature Granulocytes: 0.04 10*3/uL (ref 0.00–0.07)
Basophils Absolute: 0.1 10*3/uL (ref 0.0–0.1)
Basophils Relative: 1 %
Eosinophils Absolute: 0.2 10*3/uL (ref 0.0–0.5)
Eosinophils Relative: 1 %
HCT: 46.8 % (ref 39.0–52.0)
Hemoglobin: 15.1 g/dL (ref 13.0–17.0)
Immature Granulocytes: 0 %
Lymphocytes Relative: 62 %
Lymphs Abs: 13.8 10*3/uL — ABNORMAL HIGH (ref 0.7–4.0)
MCH: 29.6 pg (ref 26.0–34.0)
MCHC: 32.3 g/dL (ref 30.0–36.0)
MCV: 91.8 fL (ref 80.0–100.0)
Monocytes Absolute: 1.8 10*3/uL — ABNORMAL HIGH (ref 0.1–1.0)
Monocytes Relative: 8 %
Neutro Abs: 6.1 10*3/uL (ref 1.7–7.7)
Neutrophils Relative %: 28 %
Platelets: 159 10*3/uL (ref 150–400)
RBC: 5.1 MIL/uL (ref 4.22–5.81)
RDW: 14.5 % (ref 11.5–15.5)
WBC Morphology: ABNORMAL
WBC: 22 10*3/uL — ABNORMAL HIGH (ref 4.0–10.5)
nRBC: 0 % (ref 0.0–0.2)

## 2023-02-17 LAB — BASIC METABOLIC PANEL
Anion gap: 4 — ABNORMAL LOW (ref 5–15)
BUN: 20 mg/dL (ref 8–23)
CO2: 30 mmol/L (ref 22–32)
Calcium: 8.6 mg/dL — ABNORMAL LOW (ref 8.9–10.3)
Chloride: 103 mmol/L (ref 98–111)
Creatinine, Ser: 1.18 mg/dL (ref 0.61–1.24)
GFR, Estimated: 59 mL/min — ABNORMAL LOW (ref >60)
Glucose, Bld: 89 mg/dL (ref 70–99)
Potassium: 4.5 mmol/L (ref 3.5–5.1)
Sodium: 137 mmol/L (ref 135–145)

## 2023-02-17 MED ORDER — EMPAGLIFLOZIN 10 MG PO TABS: 10.0000 mg | ORAL_TABLET | Freq: Every day | ORAL | 5 refills | Status: DC

## 2023-02-17 NOTE — Patient Instructions (Addendum)
Continue weighing daily and call for an overnight weight gain of 3 pounds or more or a weekly weight gain of more than 5 pounds.   Start taking jardiance as 1 tablet every day   Go to the Urgent Care building in [place] to get your lab work drawn.    Use warm compresses/ heating pad on your arm.

## 2023-02-22 ENCOUNTER — Encounter: Payer: Medicare Other | Admitting: Family

## 2023-02-23 ENCOUNTER — Telehealth: Payer: Self-pay | Admitting: Family

## 2023-02-23 NOTE — Telephone Encounter (Signed)
Arline Asp from [facility] called asking for verbal orders for PT:  1x/ week for 1 week 2x/ week for 3 weeks 1x/ week for 5 weeks  Verbal orders given for above as patient doesn't get established with PCP until 03/06/23.

## 2023-02-24 DIAGNOSIS — I5043 Acute on chronic combined systolic (congestive) and diastolic (congestive) heart failure: Secondary | ICD-10-CM | POA: Diagnosis not present

## 2023-02-24 DIAGNOSIS — C9110 Chronic lymphocytic leukemia of B-cell type not having achieved remission: Secondary | ICD-10-CM | POA: Diagnosis not present

## 2023-02-24 DIAGNOSIS — G4733 Obstructive sleep apnea (adult) (pediatric): Secondary | ICD-10-CM | POA: Diagnosis not present

## 2023-02-24 DIAGNOSIS — I130 Hypertensive heart and chronic kidney disease with heart failure and stage 1 through stage 4 chronic kidney disease, or unspecified chronic kidney disease: Secondary | ICD-10-CM | POA: Diagnosis not present

## 2023-02-24 DIAGNOSIS — I482 Chronic atrial fibrillation, unspecified: Secondary | ICD-10-CM | POA: Diagnosis not present

## 2023-02-24 DIAGNOSIS — N1831 Chronic kidney disease, stage 3a: Secondary | ICD-10-CM | POA: Diagnosis not present

## 2023-02-26 DIAGNOSIS — I130 Hypertensive heart and chronic kidney disease with heart failure and stage 1 through stage 4 chronic kidney disease, or unspecified chronic kidney disease: Secondary | ICD-10-CM | POA: Diagnosis not present

## 2023-02-26 DIAGNOSIS — G4733 Obstructive sleep apnea (adult) (pediatric): Secondary | ICD-10-CM | POA: Diagnosis not present

## 2023-02-26 DIAGNOSIS — I5043 Acute on chronic combined systolic (congestive) and diastolic (congestive) heart failure: Secondary | ICD-10-CM | POA: Diagnosis not present

## 2023-02-26 DIAGNOSIS — C9110 Chronic lymphocytic leukemia of B-cell type not having achieved remission: Secondary | ICD-10-CM | POA: Diagnosis not present

## 2023-02-26 DIAGNOSIS — I482 Chronic atrial fibrillation, unspecified: Secondary | ICD-10-CM | POA: Diagnosis not present

## 2023-02-26 DIAGNOSIS — N1831 Chronic kidney disease, stage 3a: Secondary | ICD-10-CM | POA: Diagnosis not present

## 2023-03-03 DIAGNOSIS — I130 Hypertensive heart and chronic kidney disease with heart failure and stage 1 through stage 4 chronic kidney disease, or unspecified chronic kidney disease: Secondary | ICD-10-CM | POA: Diagnosis not present

## 2023-03-03 DIAGNOSIS — C9110 Chronic lymphocytic leukemia of B-cell type not having achieved remission: Secondary | ICD-10-CM | POA: Diagnosis not present

## 2023-03-03 DIAGNOSIS — I482 Chronic atrial fibrillation, unspecified: Secondary | ICD-10-CM | POA: Diagnosis not present

## 2023-03-03 DIAGNOSIS — G4733 Obstructive sleep apnea (adult) (pediatric): Secondary | ICD-10-CM | POA: Diagnosis not present

## 2023-03-03 DIAGNOSIS — N1831 Chronic kidney disease, stage 3a: Secondary | ICD-10-CM | POA: Diagnosis not present

## 2023-03-03 DIAGNOSIS — I5043 Acute on chronic combined systolic (congestive) and diastolic (congestive) heart failure: Secondary | ICD-10-CM | POA: Diagnosis not present

## 2023-03-05 ENCOUNTER — Ambulatory Visit (INDEPENDENT_AMBULATORY_CARE_PROVIDER_SITE_OTHER): Payer: Medicare Other

## 2023-03-05 ENCOUNTER — Encounter: Payer: Self-pay | Admitting: Physician Assistant

## 2023-03-05 ENCOUNTER — Ambulatory Visit (INDEPENDENT_AMBULATORY_CARE_PROVIDER_SITE_OTHER): Payer: Medicare Other | Admitting: Physician Assistant

## 2023-03-05 VITALS — BP 132/84 | HR 63 | Ht 67.0 in | Wt 228.0 lb

## 2023-03-05 VITALS — BP 132/84 | HR 63 | Temp 97.9°F | Ht 67.0 in | Wt 228.0 lb

## 2023-03-05 DIAGNOSIS — C9110 Chronic lymphocytic leukemia of B-cell type not having achieved remission: Secondary | ICD-10-CM | POA: Diagnosis not present

## 2023-03-05 DIAGNOSIS — I5032 Chronic diastolic (congestive) heart failure: Secondary | ICD-10-CM

## 2023-03-05 DIAGNOSIS — I499 Cardiac arrhythmia, unspecified: Secondary | ICD-10-CM | POA: Insufficient documentation

## 2023-03-05 DIAGNOSIS — I10 Essential (primary) hypertension: Secondary | ICD-10-CM | POA: Diagnosis not present

## 2023-03-05 DIAGNOSIS — I482 Chronic atrial fibrillation, unspecified: Secondary | ICD-10-CM | POA: Diagnosis not present

## 2023-03-05 DIAGNOSIS — N1831 Chronic kidney disease, stage 3a: Secondary | ICD-10-CM | POA: Diagnosis not present

## 2023-03-05 DIAGNOSIS — I130 Hypertensive heart and chronic kidney disease with heart failure and stage 1 through stage 4 chronic kidney disease, or unspecified chronic kidney disease: Secondary | ICD-10-CM | POA: Diagnosis not present

## 2023-03-05 DIAGNOSIS — G4733 Obstructive sleep apnea (adult) (pediatric): Secondary | ICD-10-CM | POA: Diagnosis not present

## 2023-03-05 DIAGNOSIS — D72820 Lymphocytosis (symptomatic): Secondary | ICD-10-CM

## 2023-03-05 DIAGNOSIS — H3530 Unspecified macular degeneration: Secondary | ICD-10-CM | POA: Insufficient documentation

## 2023-03-05 DIAGNOSIS — I5043 Acute on chronic combined systolic (congestive) and diastolic (congestive) heart failure: Secondary | ICD-10-CM | POA: Diagnosis not present

## 2023-03-05 DIAGNOSIS — Z0000 Encounter for general adult medical examination without abnormal findings: Secondary | ICD-10-CM | POA: Diagnosis not present

## 2023-03-05 NOTE — Patient Instructions (Signed)

## 2023-03-05 NOTE — Progress Notes (Signed)
Subjective:   Brent Hoover is a 89 y.o. male who presents for Medicare Annual/Subsequent preventive examination.  Review of Systems    Pt was in office       Objective:    Today's Vitals   03/05/23 1507  BP: 132/84  Pulse: 63  Weight: 228 lb (103.4 kg)  Height: 5\' 7"  (1.702 m)   Body mass index is 35.71 kg/m.     03/05/2023    3:10 PM 02/10/2023   10:00 AM 02/09/2023    4:59 PM 11/11/2022    2:30 PM 05/11/2022    1:53 PM 01/23/2022    9:27 AM 01/07/2022    1:04 PM  Advanced Directives  Does Patient Have a Medical Advance Directive? Yes Yes Yes Yes Yes Yes No  Type of Sales promotion account executive of [employer] Power of [place];Living will Healthcare Power of [place];Living will Living will;Healthcare Power of [employer] Power of [place];Living will   Does patient want to make changes to medical advance directive?  No - Patient declined       Copy of Healthcare Power of Attorney in Chart? No - copy requested No - copy requested       Would patient like information on creating a medical advance directive?      No - Patient declined     Current Medications (verified) Outpatient Encounter Medications as of 03/05/2023  Medication Sig   amLODipine (NORVASC) 2.5 MG tablet Take 2.5 mg by mouth daily.   aspirin 81 MG EC tablet Take 1 tablet by mouth daily.   carvedilol (COREG) 3.125 MG tablet Take 1 tablet by mouth every 12 (twelve) hours.   empagliflozin (JARDIANCE) 10 MG TABS tablet Take 1 tablet (10 mg total) by mouth daily before breakfast. (Patient taking differently: Take 10 mg by mouth daily before breakfast.)   ezetimibe (ZETIA) 10 MG tablet Take 1 tablet by mouth daily.   furosemide (LASIX) 40 MG tablet Take 1 tablet (40 mg total) by mouth daily. kowalski   lisinopril (ZESTRIL) 20 MG tablet TAKE (1) TABLET BY MOUTH DAILY.   nitroGLYCERIN (NITROSTAT) 0.4 MG SL tablet Place 1 tablet (0.4 mg total) under the tongue every 5  (five) minutes as needed for chest pain.   No facility-administered encounter medications on file as of 03/05/2023.    Allergies (verified) Penicillins, Atorvastatin, and Terazosin   History: Past Medical History:  Diagnosis Date   Arrhythmia    atrial fibrillation   Bladder neck obstruction    Cataracts, bilateral    CHF (congestive heart failure) (HCC)    Chronic lymphocytic leukemia (CLL), B-cell (HCC)    Lambda positive B-cell CLL   Gout    Hypercholesterolemia    Hypertension    Lymphocytosis    Sleep apnea    Thrombocytopenia (HCC)    Past Surgical History:  Procedure Laterality Date   COLONOSCOPY WITH PROPOFOL N/A 09/10/2015   Procedure: COLONOSCOPY WITH PROPOFOL;  Surgeon: Midge Minium, MD;  Location: [facility] ENDOSCOPY;  Service: Endoscopy;  Laterality: N/A;   PROSTATE SURGERY  2008   Family History  Problem Relation Age of Onset   Lung cancer Mother 41       deceased   Brain cancer Son 69       pt's only son   Polycythemia Brother        half brother   Prostate cancer Brother    Social History   Socioeconomic History   Marital status: Married  Spouse name: Not on file   Number of children: 2   Years of education: Not on file   Highest education level: Not on file  Occupational History   Not on file  Tobacco Use   Smoking status: Never   Smokeless tobacco: Never  Vaping Use   Vaping Use: Never used  Substance and Sexual Activity   Alcohol use: No    Alcohol/week: 0.0 standard drinks of alcohol   Drug use: No   Sexual activity: Not Currently  Other Topics Concern   Not on file  Social History Narrative   Not on file   Social Determinants of Health   Financial Resource Strain: Low Risk  (03/05/2023)   Overall Financial Resource Strain (CARDIA)    Difficulty of Paying Living Expenses: Not hard at all  Food Insecurity: No Food Insecurity (03/05/2023)   Hunger Vital Sign    Worried About Running Out of Food in the Last Year: Never true    Ran Out of  Food in the Last Year: Never true  Transportation Needs: No Transportation Needs (03/05/2023)   PRAPARE - Administrator, Civil Service (Medical): No    Lack of Transportation (Non-Medical): No  Physical Activity: Not on file  Stress: Not on file  Social Connections: Not on file    Tobacco Counseling Counseling given: Not Answered   Clinical Intake:  Pre-visit preparation completed: No  Pain : No/denies pain     Nutritional Risks: None Diabetes: No     Diabetic?no  Interpreter Needed?: No      Activities of Daily Living    03/05/2023    3:11 PM 02/10/2023   10:00 AM  In your present state of health, do you have any difficulty performing the following activities:  Hearing? 0 0  Vision? 0 0  Difficulty concentrating or making decisions? 0 0  Walking or climbing stairs? 1 1  Dressing or bathing? 0 0  Doing errands, shopping? 0 0  Preparing Food and eating ? N   Using the Toilet? N   In the past six months, have you accidently leaked urine? N   Do you have problems with loss of bowel control? N   Managing your Medications? N   Managing your Finances? N   Housekeeping or managing your Housekeeping? N     Patient Care Team: Remo Lipps, PA as PCP - General (Physician Assistant) Lamar Blinks, MD as Consulting Physician (Cardiology) Rickard Patience, MD as Consulting Physician (Oncology)  Indicate any recent Medical Services you may have received from other than [facility] providers in the past year (date may be approximate).     Assessment:   This is a routine wellness examination for Brent Hoover.  Hearing/Vision screen Hearing Screening - Comments:: No hearing problems  Dietary issues and exercise activities discussed: Current Exercise Habits: Structured exercise class (PT 2 times a week), Intensity: Mild   Goals Addressed   None   Depression Screen    03/05/2023    3:03 PM 01/16/2020   10:21 AM 05/19/2019    9:30 AM 04/06/2019    9:01 AM 05/24/2018     3:15 PM  PHQ 2/9 Scores  PHQ - 2 Score 1 0 0 0 0  PHQ- 9 Score 4 0 0 0     Fall Risk    03/05/2023    3:04 PM 01/16/2020   10:21 AM 04/06/2019    9:01 AM 09/29/2017    9:00 AM  Fall Risk  Falls in the past year? 0 0 0 No  Number falls in past yr: 0     Injury with Fall? 0     Risk for fall due to : No Fall Risks     Follow up Falls evaluation completed Falls evaluation completed Falls evaluation completed     FALL RISK PREVENTION PERTAINING TO THE HOME:  Any stairs in or around the home? Yes  If so, are there any without handrails? No  Home free of loose throw rugs in walkways, pet beds, electrical cords, etc? Yes  Adequate lighting in your home to reduce risk of falls? Yes   ASSISTIVE DEVICES UTILIZED TO PREVENT FALLS:  Life alert? No  Use of a cane, walker or w/c? No  Grab bars in the bathroom? Yes  Shower chair or bench in shower? Yes  Elevated toilet seat or a handicapped toilet? Yes   TIMED UP AND GO:  Was the test performed? No .  Length of time to ambulate 10 feet: n/a sec.   Gait steady and fast without use of assistive device  Cognitive Function:        03/05/2023    3:12 PM  6CIT Screen  What Year? 0 points  What month? 0 points  What time? 0 points  Count back from 20 0 points  Months in reverse 0 points  Repeat phrase 4 points  Total Score 4 points    Immunizations Immunization History  Administered Date(s) Administered   Fluad Quad(high Dose 65+) 07/19/2019   Influenza Nasal 08/10/2017   Influenza, High Dose Seasonal PF 10/15/2015, 08/18/2016, 10/04/2018, 09/30/2020   Influenza, Seasonal, Injecte, Preservative Fre 12/31/2009, 08/21/2010, 07/13/2011, 08/22/2012, 10/06/2013   Influenza-Unspecified 08/02/2000, 08/11/2000, 08/02/2001, 08/16/2002, 09/17/2003, 08/26/2004, 09/04/2005, 09/02/2006, 09/08/2007, 08/03/2008, 09/02/2014, 09/02/2017, 10/10/2018, 10/01/2020   PFIZER(Purple Top)SARS-COV-2 Vaccination 01/04/2020, 01/25/2020, 10/01/2020    Pneumococcal Conjugate-13 04/27/2014, 04/06/2019   Pneumococcal Polysaccharide-23 08/16/2002, 11/15/2007   Td 01/10/2022   Tdap 07/13/2011, 06/18/2021    TDAP status: Up to date  Flu Vaccine status: Due, Education has been provided regarding the importance of this vaccine. Advised may receive this vaccine at local pharmacy or Health Dept. Aware to provide a copy of the vaccination record if obtained from local pharmacy or Health Dept. Verbalized acceptance and understanding.  Pneumococcal vaccine status: Up to date  Covid-19 vaccine status: Completed vaccines  Qualifies for Shingles Vaccine? Yes   Zostavax completed No   Shingrix Completed?: No.    Education has been provided regarding the importance of this vaccine. Patient has been advised to call insurance company to determine out of pocket expense if they have not yet received this vaccine. Advised may also receive vaccine at local pharmacy or Health Dept. Verbalized acceptance and understanding.  Screening Tests Health Maintenance  Topic Date Due   Zoster Vaccines- Shingrix (1 of 2) Never done   COVID-19 Vaccine (4 - 2023-24 season) 03/21/2023 (Originally 07/03/2022)   INFLUENZA VACCINE  06/03/2023   Medicare Annual Wellness (AWV)  03/04/2024   DTaP/Tdap/Td (4 - Td or Tdap) 01/11/2032   Pneumonia Vaccine 57+ Years old  Completed   HPV VACCINES  Aged Out    Health Maintenance  Health Maintenance Due  Topic Date Due   Zoster Vaccines- Shingrix (1 of 2) Never done      Lung Cancer Screening: (Low Dose CT Chest recommended if Age 86-80 years, 30 pack-year currently smoking OR have quit w/in 15years.) does not qualify.   Lung Cancer Screening Referral: n/a  Additional  Screening:  Hepatitis C Screening: does qualify; Completed 07/27/2017  Vision Screening: Recommended annual ophthalmology exams for early detection of glaucoma and other disorders of the eye. Is the patient up to date with their annual eye exam?  Yes  Who  is the provider or what is the name of the office in which the patient attends annual eye exams? Dr. Brooke Dare If pt is not established with a provider, would they like to be referred to a provider to establish care? No .   Dental Screening: Recommended annual dental exams for proper oral hygiene  Community Resource Referral / Chronic Care Management: CRR required this visit?  No   CCM required this visit?  No      Plan:     I have personally reviewed and noted the following in the patient's chart:   Medical and social history Use of alcohol, tobacco or illicit drugs  Current medications and supplements including opioid prescriptions. Patient is not currently taking opioid prescriptions. Functional ability and status Nutritional status Physical activity Advanced directives List of other physicians Hospitalizations, surgeries, and ER visits in previous 12 months Vitals Screenings to include cognitive, depression, and falls Referrals and appointments  In addition, I have reviewed and discussed with patient certain preventive protocols, quality metrics, and best practice recommendations. A written personalized care plan for preventive services as well as general preventive health recommendations were provided to patient.     Eulis Canner Brent Hoover, CMA   03/05/2023   Nurse Notes: none

## 2023-03-05 NOTE — Progress Notes (Addendum)
Date:  03/05/2023   Name:  Brent Hoover   DOB:  [birth year]   MRN:  [id number]   Chief Complaint: Establish Care  HPI Brent Hoover is a very pleasant 89 year old male new to me today with complex medical history including HTN, HLD, CLL, CHF, multiple mild to moderate valvular disorders, A-fib s/p permanent pacemaker, and OSA.  He wonders if I might take over his medications. He is followed by the following specialists:  Oncology Dr. [name] - last 11/11/22, next 05/12/23 Cardiology Dr. Gwen Pounds (retiring)/Amanda Madaline Guthrie PA-C - last 10/14/22, next 04/14/23 [facility] FNP - last 02/17/23, next 03/19/23  Recent hospital admission 02/09/2023 through 02/12/2023 for acute on chronic CHF diuresed with removal of 7 L.  Follow-up labs on 02/17/2023 with significant leukocytosis of 22K which is increased from his baseline in the context of CLL (typically 15K).  They also started Jardiance 10 mg daily 02/17/23 with 30-day voucher but may be cost-prohibitive moving forward.    Medication list has been reviewed and updated.  Current Meds  Medication Sig   amLODipine (NORVASC) 2.5 MG tablet Take 2.5 mg by mouth daily.   aspirin 81 MG EC tablet Take 1 tablet by mouth daily.   carvedilol (COREG) 6.25 MG tablet Take 6.25 mg by mouth every 12 (twelve) hours.   empagliflozin (JARDIANCE) 10 MG TABS tablet Take 1 tablet (10 mg total) by mouth daily before breakfast. (Patient taking differently: Take 10 mg by mouth daily before breakfast.)   ezetimibe (ZETIA) 10 MG tablet Take 1 tablet by mouth daily.   furosemide (LASIX) 40 MG tablet Take 1 tablet (40 mg total) by mouth daily. kowalski   lisinopril (ZESTRIL) 20 MG tablet TAKE (1) TABLET BY MOUTH DAILY.   nitroGLYCERIN (NITROSTAT) 0.4 MG SL tablet Place 1 tablet (0.4 mg total) under the tongue every 5 (five) minutes as needed for chest pain.     Review of Systems  Constitutional:  Negative for fatigue and fever.  Respiratory:  Negative for chest  tightness and shortness of breath.   Cardiovascular:  Negative for chest pain and palpitations.  Gastrointestinal:  Negative for abdominal pain.    Patient Active Problem List   Diagnosis Date Noted   Irregular heart rate 03/05/2023   CHF (congestive heart failure), NYHA class III, chronic, diastolic (HCC) 02/10/2023   Hypocalcemia 05/11/2022   OSA (obstructive sleep apnea) 04/17/2021   Mild aortic stenosis 02/05/2021   Moderate tricuspid regurgitation 02/05/2021   Presence of permanent cardiac pacemaker 02/05/2021   History of colon polyps 01/29/2021   Gout 01/29/2021   Hypertrophy of prostate without urinary obstruction and other lower urinary tract symptoms (LUTS) 01/29/2021   Obesity 01/29/2021   Other heart block 01/29/2021   Shoulder joint pain 01/29/2021   Vitamin D deficiency 01/29/2021   Non-rheumatic mitral regurgitation 01/20/2021   LVH (left ventricular hypertrophy) due to hypertensive disease, with heart failure (HCC) 06/05/2020   Thoracic ascending aortic aneurysm 12/06/2019   Atherosclerosis of aorta (HCC) 04/06/2019   Chronic atrial fibrillation (HCC) 04/06/2019   HLD (hyperlipidemia) 03/31/2019   HTN (hypertension) 03/31/2019   CKD (chronic kidney disease), stage III (HCC) 03/31/2019   Hyponatremia 05/20/2018   Gastro-esophageal reflux disease without esophagitis 10/19/2017   Splenomegaly 07/27/2017   CLL (chronic lymphocytic leukemia) (HCC) 04/30/2015    Allergies  Allergen Reactions   Penicillins Other (See Comments)    Has patient had a PCN reaction causing immediate rash, facial/tongue/throat swelling, SOB or lightheadedness with hypotension: Unknown  Has patient had a PCN reaction causing severe rash involving mucus membranes or skin necrosis: Unknown Has patient had a PCN reaction that required hospitalization: Unknown Has patient had a PCN reaction occurring within the last 10 years: Unknown If all of the above answers are "NO", then may proceed with  Cephalosporin use.   Atorvastatin Rash    Other reaction(s): Muscle pain Other reaction(s): Muscle pain   Terazosin Rash    Other reaction(s): Feeling faint Other reaction(s): Feeling faint    Immunization History  Administered Date(s) Administered   Fluad Quad(high Dose 65+) 07/19/2019   Influenza Nasal 08/10/2017   Influenza, High Dose Seasonal PF 10/15/2015, 08/18/2016, 10/04/2018, 09/30/2020   Influenza, Seasonal, Injecte, Preservative Fre 12/31/2009, 08/21/2010, 07/13/2011, 08/22/2012, 10/06/2013   Influenza-Unspecified 08/02/2000, 08/11/2000, 08/02/2001, 08/16/2002, 09/17/2003, 08/26/2004, 09/04/2005, 09/02/2006, 09/08/2007, 08/03/2008, 09/02/2014, 09/02/2017, 10/10/2018, 10/01/2020   PFIZER(Purple Top)SARS-COV-2 Vaccination 01/04/2020, 01/25/2020, 10/01/2020   Pneumococcal Conjugate-13 04/27/2014, 04/06/2019   Pneumococcal Polysaccharide-23 08/16/2002, 11/15/2007   Td 01/10/2022   Tdap 07/13/2011, 06/18/2021    Past Surgical History:  Procedure Laterality Date   COLONOSCOPY WITH PROPOFOL N/A 09/10/2015   Procedure: COLONOSCOPY WITH PROPOFOL;  Surgeon: Midge Minium, MD;  Location: [facility] ENDOSCOPY;  Service: Endoscopy;  Laterality: N/A;   PROSTATE SURGERY  2008    Social History   Tobacco Use   Smoking status: Never   Smokeless tobacco: Never  Vaping Use   Vaping Use: Never used  Substance Use Topics   Alcohol use: No    Alcohol/week: 0.0 standard drinks of alcohol   Drug use: No       03/05/2023    3:04 PM 01/16/2020   10:22 AM  GAD 7 : Generalized Anxiety Score  Nervous, Anxious, on Edge 1 0  Control/stop worrying 0 0  Worry too much - different things 0 0  Trouble relaxing 0 0  Restless 0 0  Easily annoyed or irritable 0 0  Afraid - awful might happen 0 0  Total GAD 7 Score 1 0  Anxiety Difficulty Not difficult at all        03/05/2023    3:03 PM 01/16/2020   10:21 AM 05/19/2019    9:30 AM  Depression screen PHQ 2/9  Decreased Interest 1 0 0  Down,  Depressed, Hopeless 0 0 0  PHQ - 2 Score 1 0 0  Altered sleeping 0 0 0  Tired, decreased energy 3 0 0  Change in appetite 0 0 0  Feeling bad or failure about yourself  0 0 0  Trouble concentrating 0 0 0  Moving slowly or fidgety/restless 0 0 0  Suicidal thoughts 0 0 0  PHQ-9 Score 4 0 0  Difficult doing work/chores Not difficult at all      BP Readings from Last 3 Encounters:  03/05/23 132/84  03/05/23 132/84  02/17/23 126/79    Wt Readings from Last 3 Encounters:  03/05/23 228 lb (103.4 kg)  03/05/23 228 lb (103.4 kg)  02/17/23 231 lb (104.8 kg)    BP 132/84   Pulse 63   Temp 97.9 F (36.6 C) (Oral)   Ht 5\' 7"  (1.702 m)   Wt 228 lb (103.4 kg)   SpO2 96%   BMI 35.71 kg/m   Physical Exam Vitals and nursing note reviewed.  Constitutional:      Appearance: Normal appearance.  Cardiovascular:     Rate and Rhythm: Normal rate and regular rhythm.     Heart sounds: Murmur heard.  Systolic murmur is present with a grade of 2/6.     No friction rub. No gallop.     Comments: Pulses 2+ at bilateral carotid, radial, pedal sites. No carotid bruit.  2+ pitting edema LLE, trace RLE.  Pulmonary:     Effort: Pulmonary effort is normal.     Breath sounds: Normal breath sounds. No wheezing or rales.     Recent Labs     Component Value Date/Time   NA 137 02/17/2023 0947   NA 137 09/14/2014 0834   K 4.5 02/17/2023 0947   K 4.0 09/14/2014 0834   CL 103 02/17/2023 0947   CL 101 09/14/2014 0834   CO2 30 02/17/2023 0947   CO2 27 09/14/2014 0834   GLUCOSE 89 02/17/2023 0947   GLUCOSE 123 (H) 09/14/2014 0834   BUN 20 02/17/2023 0947   BUN 14 09/14/2014 0834   CREATININE 1.18 02/17/2023 0947   CREATININE 1.14 02/05/2015 0949   CALCIUM 8.6 (L) 02/17/2023 0947   CALCIUM 8.8 09/14/2014 0834   PROT 6.2 (L) 11/11/2022 1347   PROT 6.7 09/14/2014 0834   ALBUMIN 3.9 11/11/2022 1347   ALBUMIN 3.6 09/14/2014 0834   AST 20 11/11/2022 1347   AST 21 09/14/2014 0834   ALT 14  11/11/2022 1347   ALT 26 09/14/2014 0834   ALKPHOS 34 (L) 11/11/2022 1347   ALKPHOS 49 09/14/2014 0834   BILITOT 0.9 11/11/2022 1347   BILITOT 1.1 (H) 09/14/2014 0834   GFRNONAA 59 (L) 02/17/2023 0947   GFRNONAA 60 (L) 02/05/2015 0949   GFRAA 43 (L) 04/25/2020 1339   GFRAA >60 02/05/2015 0949    Lab Results  Component Value Date   WBC 22.0 (H) 02/17/2023   HGB 15.1 02/17/2023   HCT 46.8 02/17/2023   MCV 91.8 02/17/2023   PLT 159 02/17/2023   Lab Results  Component Value Date   HGBA1C 5.0 11/21/2019   Lab Results  Component Value Date   CHOL 136 11/21/2019   HDL 32 (L) 11/21/2019   LDLCALC 87 11/21/2019   TRIG 84 11/21/2019   CHOLHDL 4.3 11/21/2019   Lab Results  Component Value Date   TSH 1.971 04/17/2021     Assessment and Plan:  1. CHF (congestive heart failure), NYHA class III, chronic, diastolic (HCC) Seems to be fairly with furosemide 40 mg daily, carvedilol, and Jardiance.  Patient states he has secondary insurance through [place]"; gave $0 co-pay card for [place] which may or may not be helpful to him but he can try this at the pharmacy at his next fill.    Might consider use of compression stockings for BLE, if tolerated. Counseled on proper limb elevation, within physical ability at age 44.   Continue care with heart failure clinic as well as primary cardiologist.  Explained to patient that I would likely leave medication adjustment to his cardiology care team, but would be happy to fill most of the medications if needed, especially if stable dosing.  Patient verbalizes understanding.  2. Primary hypertension Well-controlled on current regimen, no medication changes  3. CLL (chronic lymphocytic leukemia) (HCC) Continue care with oncology, repeat CBC today given recent lymphocytosis above baseline  4. Lymphocytosis Continue care with oncology, repeat CBC today given recent lymphocytosis above baseline - CBC with Differential/Platelet   Return in  about 3 months (around 06/05/2023) for OV chronic conditions.   AWV also completed today by Laurian Brim, CMA  Partially dictated using Dragon software. Any errors are unintentional.  Alvester Morin,  PA-C, DMSc, Nutritionist [facility] [phone]

## 2023-03-07 LAB — CBC WITH DIFFERENTIAL/PLATELET
Basophils Absolute: 0.1 10*3/uL (ref 0.0–0.2)
Basos: 1 %
EOS (ABSOLUTE): 0.3 10*3/uL (ref 0.0–0.4)
Eos: 1 %
Hematocrit: 48.3 % (ref 37.5–51.0)
Hemoglobin: 15.6 g/dL (ref 13.0–17.7)
Immature Grans (Abs): 0 10*3/uL (ref 0.0–0.1)
Immature Granulocytes: 0 %
Lymphocytes Absolute: 12 10*3/uL — ABNORMAL HIGH (ref 0.7–3.1)
Lymphs: 62 %
MCH: 29.2 pg (ref 26.6–33.0)
MCHC: 32.3 g/dL (ref 31.5–35.7)
MCV: 90 fL (ref 79–97)
Monocytes Absolute: 1.4 10*3/uL — ABNORMAL HIGH (ref 0.1–0.9)
Monocytes: 7 %
Neutrophils Absolute: 5.5 10*3/uL (ref 1.4–7.0)
Neutrophils: 29 %
Platelets: 161 10*3/uL (ref 150–450)
RBC: 5.35 x10E6/uL (ref 4.14–5.80)
RDW: 14.5 % (ref 11.6–15.4)
WBC: 19.2 10*3/uL — ABNORMAL HIGH (ref 3.4–10.8)

## 2023-03-10 DIAGNOSIS — I130 Hypertensive heart and chronic kidney disease with heart failure and stage 1 through stage 4 chronic kidney disease, or unspecified chronic kidney disease: Secondary | ICD-10-CM | POA: Diagnosis not present

## 2023-03-10 DIAGNOSIS — C9110 Chronic lymphocytic leukemia of B-cell type not having achieved remission: Secondary | ICD-10-CM | POA: Diagnosis not present

## 2023-03-10 DIAGNOSIS — N1831 Chronic kidney disease, stage 3a: Secondary | ICD-10-CM | POA: Diagnosis not present

## 2023-03-10 DIAGNOSIS — I482 Chronic atrial fibrillation, unspecified: Secondary | ICD-10-CM | POA: Diagnosis not present

## 2023-03-10 DIAGNOSIS — I5043 Acute on chronic combined systolic (congestive) and diastolic (congestive) heart failure: Secondary | ICD-10-CM | POA: Diagnosis not present

## 2023-03-10 DIAGNOSIS — G4733 Obstructive sleep apnea (adult) (pediatric): Secondary | ICD-10-CM | POA: Diagnosis not present

## 2023-03-12 DIAGNOSIS — C9110 Chronic lymphocytic leukemia of B-cell type not having achieved remission: Secondary | ICD-10-CM | POA: Diagnosis not present

## 2023-03-12 DIAGNOSIS — I482 Chronic atrial fibrillation, unspecified: Secondary | ICD-10-CM | POA: Diagnosis not present

## 2023-03-12 DIAGNOSIS — I130 Hypertensive heart and chronic kidney disease with heart failure and stage 1 through stage 4 chronic kidney disease, or unspecified chronic kidney disease: Secondary | ICD-10-CM | POA: Diagnosis not present

## 2023-03-12 DIAGNOSIS — I5043 Acute on chronic combined systolic (congestive) and diastolic (congestive) heart failure: Secondary | ICD-10-CM | POA: Diagnosis not present

## 2023-03-12 DIAGNOSIS — G4733 Obstructive sleep apnea (adult) (pediatric): Secondary | ICD-10-CM | POA: Diagnosis not present

## 2023-03-12 DIAGNOSIS — N1831 Chronic kidney disease, stage 3a: Secondary | ICD-10-CM | POA: Diagnosis not present

## 2023-03-15 ENCOUNTER — Other Ambulatory Visit: Payer: Self-pay

## 2023-03-15 ENCOUNTER — Telehealth: Payer: Self-pay

## 2023-03-15 MED ORDER — FUROSEMIDE 40 MG PO TABS: 40.0000 mg | ORAL_TABLET | Freq: Every day | ORAL | 5 refills | Status: DC

## 2023-03-15 NOTE — Telephone Encounter (Addendum)
Spoke with patient's grandson Brent Hoover who is concerned that his grandfather has gained 7 lbs in a week. Patient is not experiencing swelling but is experiencing sob and is taking medications. Grandson would like guidance on what should happen.  Per [name] patient was to increase Furosemide to 2 tablets for the next three days. When I spoke with the patient's grandson Brent Hoover to give him instructions, he stated that he could not find the furosemide, when asked if patient had been taking the furosemide he stated he was unsure. Patient stated he had not been taking that for quite sometime. Per [name] refill for Furosemide was placed and patient was instructed to take 1 tablet daily. And to let the clinic know should weight continue to increase.

## 2023-03-19 ENCOUNTER — Encounter: Payer: Self-pay | Admitting: Family

## 2023-03-19 ENCOUNTER — Other Ambulatory Visit: Payer: Self-pay

## 2023-03-19 ENCOUNTER — Ambulatory Visit: Payer: Medicare Other | Attending: Family | Admitting: Family

## 2023-03-19 VITALS — BP 144/86 | HR 71 | Wt 230.2 lb

## 2023-03-19 DIAGNOSIS — M549 Dorsalgia, unspecified: Secondary | ICD-10-CM | POA: Insufficient documentation

## 2023-03-19 DIAGNOSIS — I7121 Aneurysm of the ascending aorta, without rupture: Secondary | ICD-10-CM | POA: Diagnosis not present

## 2023-03-19 DIAGNOSIS — I712 Thoracic aortic aneurysm, without rupture, unspecified: Secondary | ICD-10-CM | POA: Diagnosis not present

## 2023-03-19 DIAGNOSIS — E7800 Pure hypercholesterolemia, unspecified: Secondary | ICD-10-CM | POA: Diagnosis not present

## 2023-03-19 DIAGNOSIS — Z79899 Other long term (current) drug therapy: Secondary | ICD-10-CM | POA: Insufficient documentation

## 2023-03-19 DIAGNOSIS — I10 Essential (primary) hypertension: Secondary | ICD-10-CM | POA: Diagnosis not present

## 2023-03-19 DIAGNOSIS — I5032 Chronic diastolic (congestive) heart failure: Secondary | ICD-10-CM

## 2023-03-19 DIAGNOSIS — I482 Chronic atrial fibrillation, unspecified: Secondary | ICD-10-CM | POA: Diagnosis not present

## 2023-03-19 DIAGNOSIS — I130 Hypertensive heart and chronic kidney disease with heart failure and stage 1 through stage 4 chronic kidney disease, or unspecified chronic kidney disease: Secondary | ICD-10-CM | POA: Insufficient documentation

## 2023-03-19 DIAGNOSIS — I509 Heart failure, unspecified: Secondary | ICD-10-CM | POA: Insufficient documentation

## 2023-03-19 DIAGNOSIS — I428 Other cardiomyopathies: Secondary | ICD-10-CM | POA: Diagnosis not present

## 2023-03-19 DIAGNOSIS — H269 Unspecified cataract: Secondary | ICD-10-CM | POA: Diagnosis not present

## 2023-03-19 DIAGNOSIS — N189 Chronic kidney disease, unspecified: Secondary | ICD-10-CM | POA: Diagnosis not present

## 2023-03-19 DIAGNOSIS — Z7982 Long term (current) use of aspirin: Secondary | ICD-10-CM | POA: Diagnosis not present

## 2023-03-19 DIAGNOSIS — I4891 Unspecified atrial fibrillation: Secondary | ICD-10-CM | POA: Diagnosis not present

## 2023-03-19 DIAGNOSIS — C9110 Chronic lymphocytic leukemia of B-cell type not having achieved remission: Secondary | ICD-10-CM | POA: Insufficient documentation

## 2023-03-19 DIAGNOSIS — I5043 Acute on chronic combined systolic (congestive) and diastolic (congestive) heart failure: Secondary | ICD-10-CM | POA: Diagnosis not present

## 2023-03-19 DIAGNOSIS — G4733 Obstructive sleep apnea (adult) (pediatric): Secondary | ICD-10-CM

## 2023-03-19 DIAGNOSIS — N1831 Chronic kidney disease, stage 3a: Secondary | ICD-10-CM | POA: Diagnosis not present

## 2023-03-19 DIAGNOSIS — M109 Gout, unspecified: Secondary | ICD-10-CM | POA: Diagnosis not present

## 2023-03-19 DIAGNOSIS — I808 Phlebitis and thrombophlebitis of other sites: Secondary | ICD-10-CM | POA: Insufficient documentation

## 2023-03-19 DIAGNOSIS — I350 Nonrheumatic aortic (valve) stenosis: Secondary | ICD-10-CM | POA: Diagnosis not present

## 2023-03-19 NOTE — Progress Notes (Signed)
[facility] HEART FAILURE CLINIC - Pharmacist Note  Brent Hoover is a 89 y.o. male with HFpEF (EF >50%) presenting to the Heart Failure Clinic for follow up. He reports that he accidentally unenrolled from Medicare Part D and has been paying cash prices for his medications since. He reports that this has been doable for him, but he would like some help getting re-enrolled. Patient advocate has been notified. He stated that he developed pain near his tailbone after starting Jardiance and stopped taking it after that. He would be open to trying this again, but would need patient assistance to afford it. Patient advocate has been notified and will follow up with the patient. He reports his weight has been stable around 222 lbs. Today he reports some shortness of breath and some ongoing leg swelling (L > R).   Recent ED Visit (past 6 months):  Date: 02/09/2023, CC: SOB  Guideline-Directed Medical Therapy/Evidence Based Medicine ACE/ARB/ARNI: Lisinopril 20 mg daily Beta Blocker: Carvedilol 6.25 mg twice daily Aldosterone Antagonist:  none Diuretic: Furosemide 40 mg daily SGLT2i: none  Adherence Assessment Do you ever forget to take your medication? [] Yes [x] No  Do you ever skip doses due to side effects? [] Yes [x] No  Do you have trouble affording your medicines? [x] Yes  [] No  Are you ever unable to pick up your medication due to transportation difficulties? [] Yes [x] No  Do you ever stop taking your medications because you don't believe they are helping? [] Yes [x] No  Do you check your weight daily? [x] Yes [] No  Adherence strategy: pillbox Barriers to obtaining medications: cost of Jardiance, no active prescription insurance  Diagnostics ECHO: Date 4/10/20242, EF 50-55%, no RWMA, G2DD  Vitals    03/05/2023    3:07 PM 03/05/2023    2:47 PM 02/17/2023    8:27 AM  Vitals with BMI  Height 5\' 7"  5\' 7"    Weight 228 lbs 228 lbs 231 lbs  BMI 35.7 35.7   Systolic 132 132 696  Diastolic 84 84 79   Pulse 63 63 80     Recent Labs    Latest Ref Rng & Units 02/17/2023    9:47 AM 02/12/2023    2:59 AM 02/11/2023    3:56 AM  BMP  Glucose 70 - 99 mg/dL 89  295  284   BUN 8 - 23 mg/dL 20  22  16    Creatinine 0.61 - 1.24 mg/dL 1.32  4.40  1.02   Sodium 135 - 145 mmol/L 137  134  138   Potassium 3.5 - 5.1 mmol/L 4.5  3.4  3.6   Chloride 98 - 111 mmol/L 103  99  102   CO2 22 - 32 mmol/L 30  27  28    Calcium 8.9 - 10.3 mg/dL 8.6  8.1  8.6     Past Medical History Past Medical History:  Diagnosis Date   Arrhythmia    atrial fibrillation   Bladder neck obstruction    Cataracts, bilateral    CHF (congestive heart failure) (HCC)    Chronic lymphocytic leukemia (CLL), B-cell (HCC)    Lambda positive B-cell CLL   Gout    Hypercholesterolemia    Hypertension    Lymphocytosis    Sleep apnea    Thrombocytopenia (HCC)     Plan Continue regimen as directed by NP Annual echo due 02/2024 Consider second trial of Jardiance Follow up with patient advocate for help with medication access  Time spent: 10 minutes  Celene Squibb, PharmD PGY1 Pharmacy  Resident 03/19/2023 11:50 AM

## 2023-03-19 NOTE — Patient Instructions (Signed)
Get compression socks and put them on every morning with removal at bedtime 

## 2023-03-19 NOTE — Progress Notes (Signed)
PCP: Tillie Fantasia, PA 05/24  Primary Cardiologist: Minda Ditto, PA (last seen 12/23; returns 06/24)  HPI:  Brent Hoover is a 89 y/o male with a history of CLL, hyperlipidemia, HTN, AF, gout, sleep apnea and chronic heart failure. 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 IV diuresed and lost ~ 7 liters.    He presents today for a HF f/u visit with a chief complaint of moderate SOB with minimal exertion. Chronic in nature. Has associated leg weakness, pedal edema and tailbone pain. He says that the tailbone pain started around the same time that he had started jardiance. He stopped taking the jardiance and the pain is improving. Did resume his furosemide a few days ago and says that his weight at home is decreasing and this morning it was 222 pounds.   He had called the office earlier this week c/o gaining 7 pounds in the last week. Was supposed to be taking furosemide and it was going to be doubled but then patient said that he hadn't been taking this in "quite some time". He was instructed to resume furosemide 40mg  daily.   Wife is in an alzheimer's unit which is financially stressful for him.   ROS: All systems negative except as listed in HPI, PMH and Problem List.  SH:  Social History   Socioeconomic History   Marital status: Married    Spouse name: Not on file   Number of children: 2   Years of education: Not on file   Highest education level: Not on file  Occupational History   Not on file  Tobacco Use   Smoking status: Never   Smokeless tobacco: Never  Vaping Use   Vaping Use: Never used  Substance and Sexual Activity   Alcohol use: No    Alcohol/week: 0.0 standard drinks of alcohol   Drug use: No   Sexual  activity: Not Currently  Other Topics Concern   Not on file  Social History Narrative   Not on file   Social Determinants of Health   Financial Resource Strain: Low Risk  (03/05/2023)   Overall Financial Resource Strain (CARDIA)    Difficulty of Paying Living Expenses: Not hard at all  Food Insecurity: No Food Insecurity (03/05/2023)   Hunger Vital Sign    Worried About Running Out of Food in the Last Year: Never true    Ran Out of Food in the Last Year: Never true  Transportation Needs: No Transportation Needs (03/05/2023)   PRAPARE - Administrator, Civil Service (Medical): No    Lack of Transportation (Non-Medical): No  Physical Activity: Not on file  Stress: Not on file  Social Connections: Not on file  Intimate Partner Violence: Not At Risk (03/05/2023)   Humiliation, Afraid, Rape, and Kick questionnaire    Fear of Current or Ex-Partner: No    Emotionally Abused: No    Physically Abused: No    Sexually Abused: No    FH:  Family History  Problem Relation Age of Onset   Lung cancer Mother 3       deceased   Brain cancer Son 74       pt's only son   Polycythemia Brother  half brother   Prostate cancer Brother     Past Medical History:  Diagnosis Date   Arrhythmia    atrial fibrillation   Bladder neck obstruction    Cataracts, bilateral    CHF (congestive heart failure) (HCC)    Chronic lymphocytic leukemia (CLL), B-cell (HCC)    Lambda positive B-cell CLL   Gout    Hypercholesterolemia    Hypertension    Lymphocytosis    Sleep apnea    Thrombocytopenia (HCC)     Current Outpatient Medications  Medication Sig Dispense Refill   amLODipine (NORVASC) 2.5 MG tablet Take 2.5 mg by mouth daily.     aspirin 81 MG EC tablet Take 1 tablet by mouth daily.     carvedilol (COREG) 6.25 MG tablet Take 6.25 mg by mouth every 12 (twelve) hours.     empagliflozin (JARDIANCE) 10 MG TABS tablet Take 1 tablet (10 mg total) by mouth daily before breakfast.  (Patient taking differently: Take 10 mg by mouth daily before breakfast.) 30 tablet 5   ezetimibe (ZETIA) 10 MG tablet Take 1 tablet by mouth daily.     furosemide (LASIX) 40 MG tablet Take 1 tablet (40 mg total) by mouth daily. kowalski 30 tablet 5   lisinopril (ZESTRIL) 20 MG tablet TAKE (1) TABLET BY MOUTH DAILY. 30 tablet 0   nitroGLYCERIN (NITROSTAT) 0.4 MG SL tablet Place 1 tablet (0.4 mg total) under the tongue every 5 (five) minutes as needed for chest pain. 15 tablet 0   No current facility-administered medications for this visit.   Vitals:   03/19/23 1019  BP: (!) 144/86  Pulse: 71  SpO2: 98%  Weight: 230 lb 3.2 oz (104.4 kg)   Wt Readings from Last 3 Encounters:  03/19/23 230 lb 3.2 oz (104.4 kg)  03/05/23 228 lb (103.4 kg)  03/05/23 228 lb (103.4 kg)   Lab Results  Component Value Date   CREATININE 1.18 02/17/2023   CREATININE 1.08 02/12/2023   CREATININE 1.16 02/11/2023   PHYSICAL EXAM:  General:  Well appearing. No resp difficulty HEENT: normal Neck: supple. JVP flat. No lymphadenopathy or thryomegaly appreciated. Cor: PMI normal. Regular rate & rhythm. No rubs, gallops or murmurs. Lungs: clear Abdomen: soft, nontender, nondistended. No hepatosplenomegaly. No bruits or masses.  Extremities: no cyanosis, clubbing, rash. 1+ pitting edema in bilateral lower legs Neuro: alert & orientedx3, cranial nerves grossly intact. Moves all 4 extremities w/o difficulty. Affect pleasant.   ECG: not done   ASSESSMENT & PLAN:  1: NICM with preserved ejection fraction- - likely due to HTN/ AF - NYHA class III - euvolemic today - weighing daily and home weight has declined to 222 pounds since the resumption of diuretic; reminded to call for an overnight weight gain of > 2 pounds or a weekly weight gain of > 5 pounds - weight down 1 pound from last visit here 1 month ago - Echo 02/10/23: EF 50-55% with severe LVH/ LAE. Echo 01/29/21: EF 45% with mild LVH & moderate valvular  regurgitation - not adding salt to his food and is reading food labels and trying to eat more fresh fruits/ vegs; does get meals on wheels but he says that he sometimes only eats 1/2 for lunch and the other 1/2 for supper - continue carvedilol 3.125mg  BID - continue furosemide 40mg  daily; just resumed this 4 days ago - continue lisinopril 20mg  daily; discussed changing to entresto in the future - patient developed tailbone/ back pain with jardiance, stopped it and  pain is improving; will not resume at this time; if tries to re-challenge, will need patient assistance as he can't afford it - instructed to get compression socks and put them on every morning with removal at bedtime - does elevate his legs when sitting for long periods of time - BNP 02/09/23 was 832.3 - PharmD reconciled meds w/ patient  2: HTN w/ CKD- - BP 144/86 - saw PCP [name]) 03/05/23 - BMP 02/17/23 showed sodium 137, potassium 4.5, creatinine 1.18 & GFR 59 - has started PT & currently gets this weekly; does do the PT exercises the other days of the week  3: Atrial fibrillation- - Medtronic PPM 10/2017 present due to bradycardia - saw cardiology Madaline Guthrie) 12/23 - currently not on anticoag due to fall risk  4: Chronic ascending thoracic aortic aneurysm- - chest CT 02/09/23 showed 4.8cm aneurysm - previous surgical evaluation said due to age/ comorbidities it was unlikely to be successfully corrected  5: CLL- - previously treated with [name]  - saw oncology [name]) 01/24  6: OSA- - wearing CPAP nightly  - reports sleeping well for 5 hours and then wakes up frequently   7: Arm thrombophlebitis- - resolved  Return in 1 month for BMP , sooner if needed.

## 2023-03-22 ENCOUNTER — Telehealth (HOSPITAL_COMMUNITY): Payer: Self-pay

## 2023-03-22 ENCOUNTER — Other Ambulatory Visit (HOSPITAL_COMMUNITY): Payer: Self-pay

## 2023-03-22 NOTE — Telephone Encounter (Signed)
Patient Advocate Encounter  Contacted patient regarding eligibility for the [facility] [employer] assistance program that would provide his medication for $0.   Patient informed me that he is not currently taking this medication and has discussed this with the prescriber at his recent appointment.   I have advised him to contact me directly or to have someone in the office message me if he begins this medication again.  Burnell Blanks, CPhT Rx Patient Advocate Phone: [phone]

## 2023-03-24 DIAGNOSIS — G4733 Obstructive sleep apnea (adult) (pediatric): Secondary | ICD-10-CM | POA: Diagnosis not present

## 2023-03-24 DIAGNOSIS — C9110 Chronic lymphocytic leukemia of B-cell type not having achieved remission: Secondary | ICD-10-CM | POA: Diagnosis not present

## 2023-03-24 DIAGNOSIS — I482 Chronic atrial fibrillation, unspecified: Secondary | ICD-10-CM | POA: Diagnosis not present

## 2023-03-24 DIAGNOSIS — N1831 Chronic kidney disease, stage 3a: Secondary | ICD-10-CM | POA: Diagnosis not present

## 2023-03-24 DIAGNOSIS — I130 Hypertensive heart and chronic kidney disease with heart failure and stage 1 through stage 4 chronic kidney disease, or unspecified chronic kidney disease: Secondary | ICD-10-CM | POA: Diagnosis not present

## 2023-03-24 DIAGNOSIS — I5043 Acute on chronic combined systolic (congestive) and diastolic (congestive) heart failure: Secondary | ICD-10-CM | POA: Diagnosis not present

## 2023-04-03 DIAGNOSIS — G4733 Obstructive sleep apnea (adult) (pediatric): Secondary | ICD-10-CM | POA: Diagnosis not present

## 2023-04-03 DIAGNOSIS — I130 Hypertensive heart and chronic kidney disease with heart failure and stage 1 through stage 4 chronic kidney disease, or unspecified chronic kidney disease: Secondary | ICD-10-CM | POA: Diagnosis not present

## 2023-04-03 DIAGNOSIS — C9110 Chronic lymphocytic leukemia of B-cell type not having achieved remission: Secondary | ICD-10-CM | POA: Diagnosis not present

## 2023-04-03 DIAGNOSIS — I482 Chronic atrial fibrillation, unspecified: Secondary | ICD-10-CM | POA: Diagnosis not present

## 2023-04-03 DIAGNOSIS — I5043 Acute on chronic combined systolic (congestive) and diastolic (congestive) heart failure: Secondary | ICD-10-CM | POA: Diagnosis not present

## 2023-04-03 DIAGNOSIS — N1831 Chronic kidney disease, stage 3a: Secondary | ICD-10-CM | POA: Diagnosis not present

## 2023-04-07 ENCOUNTER — Telehealth: Payer: Self-pay

## 2023-04-07 DIAGNOSIS — N1831 Chronic kidney disease, stage 3a: Secondary | ICD-10-CM | POA: Diagnosis not present

## 2023-04-07 DIAGNOSIS — G4733 Obstructive sleep apnea (adult) (pediatric): Secondary | ICD-10-CM | POA: Diagnosis not present

## 2023-04-07 DIAGNOSIS — C9110 Chronic lymphocytic leukemia of B-cell type not having achieved remission: Secondary | ICD-10-CM | POA: Diagnosis not present

## 2023-04-07 DIAGNOSIS — I5043 Acute on chronic combined systolic (congestive) and diastolic (congestive) heart failure: Secondary | ICD-10-CM | POA: Diagnosis not present

## 2023-04-07 DIAGNOSIS — I130 Hypertensive heart and chronic kidney disease with heart failure and stage 1 through stage 4 chronic kidney disease, or unspecified chronic kidney disease: Secondary | ICD-10-CM | POA: Diagnosis not present

## 2023-04-07 DIAGNOSIS — I482 Chronic atrial fibrillation, unspecified: Secondary | ICD-10-CM | POA: Diagnosis not present

## 2023-04-07 NOTE — Progress Notes (Signed)
Pt and his physical therapist called to report SOB, restlessness, trouble walking and putting on his socks. Pt has wheezing in his rt lung and is up 2 lbs. Per Clarisa Kindred, FNP pt needs to be seen. Pt schedule for tomorrow 04/08/23 @ 1:30PM.

## 2023-04-08 ENCOUNTER — Other Ambulatory Visit
Admission: RE | Admit: 2023-04-08 | Discharge: 2023-04-08 | Disposition: A | Discharge status: Home / Self Care | Payer: Medicare Other | Source: Ambulatory Visit | Attending: Family | Admitting: Family

## 2023-04-08 ENCOUNTER — Telehealth: Payer: Self-pay

## 2023-04-08 ENCOUNTER — Encounter: Payer: Self-pay | Admitting: Family

## 2023-04-08 ENCOUNTER — Ambulatory Visit (HOSPITAL_BASED_OUTPATIENT_CLINIC_OR_DEPARTMENT_OTHER): Payer: Medicare Other | Admitting: Family

## 2023-04-08 VITALS — BP 123/74 | HR 84 | Wt 231.0 lb

## 2023-04-08 DIAGNOSIS — G4733 Obstructive sleep apnea (adult) (pediatric): Secondary | ICD-10-CM | POA: Diagnosis not present

## 2023-04-08 DIAGNOSIS — C9110 Chronic lymphocytic leukemia of B-cell type not having achieved remission: Secondary | ICD-10-CM | POA: Diagnosis not present

## 2023-04-08 DIAGNOSIS — I10 Essential (primary) hypertension: Secondary | ICD-10-CM

## 2023-04-08 DIAGNOSIS — I7121 Aneurysm of the ascending aorta, without rupture: Secondary | ICD-10-CM

## 2023-04-08 DIAGNOSIS — I482 Chronic atrial fibrillation, unspecified: Secondary | ICD-10-CM

## 2023-04-08 DIAGNOSIS — I5032 Chronic diastolic (congestive) heart failure: Secondary | ICD-10-CM | POA: Insufficient documentation

## 2023-04-08 LAB — BASIC METABOLIC PANEL
Anion gap: 10 (ref 5–15)
BUN: 21 mg/dL (ref 8–23)
CO2: 25 mmol/L (ref 22–32)
Calcium: 8.8 mg/dL — ABNORMAL LOW (ref 8.9–10.3)
Chloride: 105 mmol/L (ref 98–111)
Creatinine, Ser: 1.02 mg/dL (ref 0.61–1.24)
GFR, Estimated: >60 mL/min (ref >60)
Glucose, Bld: 107 mg/dL — ABNORMAL HIGH (ref 70–99)
Potassium: 3.8 mmol/L (ref 3.5–5.1)
Sodium: 140 mmol/L (ref 135–145)

## 2023-04-08 LAB — BRAIN NATRIURETIC PEPTIDE: B Natriuretic Peptide: 347.6 pg/mL — ABNORMAL HIGH (ref 0.0–100.0)

## 2023-04-08 MED ORDER — POTASSIUM CHLORIDE CRYS ER 20 MEQ PO TBCR: EXTENDED_RELEASE_TABLET | ORAL | 0 refills | Status: DC

## 2023-04-08 NOTE — Progress Notes (Signed)
Called and spoke w/ pt about the following: Potassium normal but on the low end. Please begin 2 potassium tablets daily for the next 3 days with the increased lasix dose. Will need RX for tablets # 6. Will also need f/u appt for next week scheduled.   Pt had good understanding and no further questions. Rx sent in. Follow up scheduled 6/13 @ 1PM.

## 2023-04-08 NOTE — Progress Notes (Signed)
PCP: Tillie Fantasia, PA 05/24  Primary Cardiologist: Minda Ditto, PA (last seen 12/23; returns 06/24)  HPI:  [name] is a 89 y/o male with a history of CLL, hyperlipidemia, HTN, AF, gout, sleep apnea and chronic heart failure. 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 IV diuresed and lost ~ 7 liters.    He presents today for a HF f/u visit with a chief complaint of moderate SOB with little exertion. Chronic in nature although he does feel like his SOB has worsened some recently. He has associated wheezing, chest tightness first thing in the morning, dizziness and slight edema along with this. Says his physical therapist noticed the symptoms and asked for patient to be seen. Weight is running 220-222 lbs at home.  No changed in diet and is not adding salt to his food.   ROS: All systems negative except as listed in HPI, PMH and Problem List.  SH:  Social History   Socioeconomic History   Marital status: Married    Spouse name: Not on file   Number of children: 2   Years of education: Not on file   Highest education level: Not on file  Occupational History   Not on file  Tobacco Use   Smoking status: Never   Smokeless tobacco: Never  Vaping Use   Vaping Use: Never used  Substance and Sexual Activity   Alcohol use: No    Alcohol/week: 0.0 standard drinks of alcohol   Drug use: No   Sexual activity: Not Currently  Other Topics Concern   Not on file  Social History Narrative   Not on file   Social Determinants of Health   Financial Resource Strain: Low Risk  (03/05/2023)   Overall Financial Resource Strain (CARDIA)    Difficulty of Paying Living Expenses: Not hard at all  Food Insecurity: No Food Insecurity  (03/05/2023)   Hunger Vital Sign    Worried About Running Out of Food in the Last Year: Never true    Ran Out of Food in the Last Year: Never true  Transportation Needs: No Transportation Needs (03/05/2023)   PRAPARE - Administrator, Civil Service (Medical): No    Lack of Transportation (Non-Medical): No  Physical Activity: Not on file  Stress: Not on file  Social Connections: Not on file  Intimate Partner Violence: Not At Risk (03/05/2023)   Humiliation, Afraid, Rape, and Kick questionnaire    Fear of Current or Ex-Partner: No    Emotionally Abused: No    Physically Abused: No    Sexually Abused: No    FH:  Family History  Problem Relation Age of Onset   Lung cancer Mother 32       deceased   Brain cancer Son 62       pt's only son   Polycythemia Brother        half brother   Prostate cancer Brother     Past Medical History:  Diagnosis Date   Arrhythmia    atrial fibrillation   Bladder neck obstruction    Cataracts, bilateral    CHF (congestive heart failure) (HCC)    Chronic lymphocytic leukemia (CLL), B-cell (HCC)    Lambda positive B-cell CLL   Gout  Hypercholesterolemia    Hypertension    Lymphocytosis    Sleep apnea    Thrombocytopenia (HCC)     Current Outpatient Medications  Medication Sig Dispense Refill   amLODipine (NORVASC) 2.5 MG tablet Take 2.5 mg by mouth daily.     aspirin 81 MG EC tablet Take 1 tablet by mouth daily.     carvedilol (COREG) 6.25 MG tablet Take 6.25 mg by mouth every 12 (twelve) hours.     empagliflozin (JARDIANCE) 10 MG TABS tablet Take 1 tablet (10 mg total) by mouth daily before breakfast. (Patient not taking: Reported on 03/19/2023) 30 tablet 5   ezetimibe (ZETIA) 10 MG tablet Take 1 tablet by mouth daily.     furosemide (LASIX) 40 MG tablet Take 1 tablet (40 mg total) by mouth daily. kowalski 30 tablet 5   lisinopril (ZESTRIL) 20 MG tablet TAKE (1) TABLET BY MOUTH DAILY. 30 tablet 0   nitroGLYCERIN (NITROSTAT) 0.4 MG  SL tablet Place 1 tablet (0.4 mg total) under the tongue every 5 (five) minutes as needed for chest pain. 15 tablet 0   No current facility-administered medications for this visit.   Vitals:   04/08/23 1322  BP: 123/74  Pulse: 84  SpO2: 98%  Weight: 231 lb (104.8 kg)   Wt Readings from Last 3 Encounters:  04/08/23 231 lb (104.8 kg)  03/19/23 230 lb 3.2 oz (104.4 kg)  03/05/23 228 lb (103.4 kg)   Lab Results  Component Value Date   CREATININE 1.18 02/17/2023   CREATININE 1.08 02/12/2023   CREATININE 1.16 02/11/2023   PHYSICAL EXAM:  General:  Well appearing. No resp difficulty HEENT: normal Neck: supple. JVP flat. No lymphadenopathy or thryomegaly appreciated. Cor: PMI normal. Regular rate & rhythm. No rubs, gallops or murmurs. Lungs: rhonchi in lower lobes Abdomen: soft, nontender, nondistended. No hepatosplenomegaly. No bruits or masses.  Extremities: no cyanosis, clubbing, rash. 1+ pitting edema in bilateral lower legs with L>R Neuro: alert & oriented x3, cranial nerves grossly intact. Moves all 4 extremities w/o difficulty. Affect pleasant.  ReDs: 37%  ECG: not done   ASSESSMENT & PLAN:  1: NICM with preserved ejection fraction- - likely due to HTN/ AF - NYHA class III - fluid overloaded today with worsening symptoms and elevated ReDs reading - weighing daily and home weight ranges from 220-222 pounds; reminded to call for an overnight weight gain of > 2 pounds or a weekly weight gain of > 5 pounds - weight up 1 pound from last visit here 3 weeks ago - Echo 02/10/23: EF 50-55% with severe LVH/ LAE. Echo 01/29/21: EF 45% with mild LVH & moderate valvular regurgitation - not adding salt to his food and is reading food labels and trying to eat more fresh fruits/ vegs; does get meals on wheels but he says that he sometimes only eats 1/2 for lunch and the other 1/2 for supper - ReDs: 37% - double furosemide to 80mg  daily for 3 days & then decrease it back to 40mg  daily   - BMP & BNP today and will let patient know if potassium supplementation is needed - BMP next visit - continue carvedilol 6.25mg  BID - continue lisinopril 20mg  daily - patient had developed tailbone/ back pain with jardiance, stopped it and pain is improving; will not resume at this time; if tries to re-challenge, will need patient assistance as he can't afford it - wearing compression socks daily - does elevate his legs when sitting for long periods of time -  BNP 02/09/23 was 832.3  2: HTN w/ CKD- - BP 123/74 - saw PCP [name]) 03/05/23 - BMP 02/17/23 showed sodium 137, potassium 4.5, creatinine 1.18 & GFR 59 - BMP today - has started PT & currently gets this weekly; does do the PT exercises the other days of the week  3: Atrial fibrillation- - Medtronic PPM 10/2017 present due to bradycardia - saw cardiology Madaline Guthrie) 12/23 - currently not on anticoag due to fall risk  4: Chronic ascending thoracic aortic aneurysm- - chest CT 02/09/23 showed 4.8cm aneurysm - previous surgical evaluation said due to age/ comorbidities it was unlikely to be successfully corrected  5: CLL- - previously treated with [name]  - saw oncology [name]) 01/24  6: OSA- - wearing CPAP nightly  - reports sleeping well for 5 hours and then wakes up frequently   Return next week, sooner if needed.

## 2023-04-08 NOTE — Patient Instructions (Signed)
Double your lasix to 2 tablets daily for the next 3 days.

## 2023-04-14 DIAGNOSIS — I7121 Aneurysm of the ascending aorta, without rupture: Secondary | ICD-10-CM | POA: Diagnosis not present

## 2023-04-14 DIAGNOSIS — I10 Essential (primary) hypertension: Secondary | ICD-10-CM | POA: Diagnosis not present

## 2023-04-14 DIAGNOSIS — I482 Chronic atrial fibrillation, unspecified: Secondary | ICD-10-CM | POA: Diagnosis not present

## 2023-04-14 DIAGNOSIS — I5022 Chronic systolic (congestive) heart failure: Secondary | ICD-10-CM | POA: Diagnosis not present

## 2023-04-14 DIAGNOSIS — I5043 Acute on chronic combined systolic (congestive) and diastolic (congestive) heart failure: Secondary | ICD-10-CM | POA: Diagnosis not present

## 2023-04-14 DIAGNOSIS — E782 Mixed hyperlipidemia: Secondary | ICD-10-CM | POA: Diagnosis not present

## 2023-04-14 DIAGNOSIS — N1831 Chronic kidney disease, stage 3a: Secondary | ICD-10-CM | POA: Diagnosis not present

## 2023-04-14 DIAGNOSIS — G4733 Obstructive sleep apnea (adult) (pediatric): Secondary | ICD-10-CM | POA: Diagnosis not present

## 2023-04-14 DIAGNOSIS — I130 Hypertensive heart and chronic kidney disease with heart failure and stage 1 through stage 4 chronic kidney disease, or unspecified chronic kidney disease: Secondary | ICD-10-CM | POA: Diagnosis not present

## 2023-04-14 DIAGNOSIS — C9110 Chronic lymphocytic leukemia of B-cell type not having achieved remission: Secondary | ICD-10-CM | POA: Diagnosis not present

## 2023-04-14 DIAGNOSIS — Z950 Presence of cardiac pacemaker: Secondary | ICD-10-CM | POA: Diagnosis not present

## 2023-04-15 ENCOUNTER — Telehealth: Payer: Self-pay

## 2023-04-15 ENCOUNTER — Other Ambulatory Visit
Admission: RE | Admit: 2023-04-15 | Discharge: 2023-04-15 | Disposition: A | Discharge status: Home / Self Care | Payer: Medicare Other | Source: Ambulatory Visit | Attending: Family | Admitting: Family

## 2023-04-15 ENCOUNTER — Ambulatory Visit (HOSPITAL_BASED_OUTPATIENT_CLINIC_OR_DEPARTMENT_OTHER): Payer: Medicare Other | Admitting: Family

## 2023-04-15 ENCOUNTER — Encounter: Payer: Self-pay | Admitting: Family

## 2023-04-15 VITALS — BP 118/66 | HR 65 | Wt 228.0 lb

## 2023-04-15 DIAGNOSIS — I10 Essential (primary) hypertension: Secondary | ICD-10-CM | POA: Diagnosis not present

## 2023-04-15 DIAGNOSIS — G4733 Obstructive sleep apnea (adult) (pediatric): Secondary | ICD-10-CM | POA: Diagnosis not present

## 2023-04-15 DIAGNOSIS — C9110 Chronic lymphocytic leukemia of B-cell type not having achieved remission: Secondary | ICD-10-CM

## 2023-04-15 DIAGNOSIS — I5032 Chronic diastolic (congestive) heart failure: Secondary | ICD-10-CM | POA: Diagnosis not present

## 2023-04-15 DIAGNOSIS — I482 Chronic atrial fibrillation, unspecified: Secondary | ICD-10-CM | POA: Diagnosis not present

## 2023-04-15 DIAGNOSIS — I7121 Aneurysm of the ascending aorta, without rupture: Secondary | ICD-10-CM

## 2023-04-15 LAB — BASIC METABOLIC PANEL
Anion gap: 10 (ref 5–15)
BUN: 31 mg/dL — ABNORMAL HIGH (ref 8–23)
CO2: 25 mmol/L (ref 22–32)
Calcium: 8.6 mg/dL — ABNORMAL LOW (ref 8.9–10.3)
Chloride: 101 mmol/L (ref 98–111)
Creatinine, Ser: 1.4 mg/dL — ABNORMAL HIGH (ref 0.61–1.24)
GFR, Estimated: 48 mL/min — ABNORMAL LOW (ref >60)
Glucose, Bld: 99 mg/dL (ref 70–99)
Potassium: 3.8 mmol/L (ref 3.5–5.1)
Sodium: 136 mmol/L (ref 135–145)

## 2023-04-15 LAB — BRAIN NATRIURETIC PEPTIDE: B Natriuretic Peptide: 254.3 pg/mL — ABNORMAL HIGH (ref 0.0–100.0)

## 2023-04-15 MED ORDER — FUROSEMIDE 80 MG PO TABS: 80.0000 mg | ORAL_TABLET | Freq: Every day | ORAL | 3 refills | Status: DC

## 2023-04-15 MED ORDER — POTASSIUM CHLORIDE CRYS ER 20 MEQ PO TBCR
20.0000 meq | EXTENDED_RELEASE_TABLET | Freq: Every day | ORAL | 3 refills | Status: AC
Start: 2023-04-15 — End: ?

## 2023-04-15 NOTE — Progress Notes (Signed)
PCP: Tillie Fantasia, PA (last seen 05/24)  Primary Cardiologist: Minda Ditto, PA (last seen 12/23; returns 06/24)  HPI:  Brent Hoover is a 89 y/o male with a history of CLL, hyperlipidemia, HTN, mild AS, thoracic ascending aortic aneurysm, OSA, AF, gout, sleep apnea and chronic heart failure. Has a Medtronic PPM model AZURE [id number] due to bradycardia implanted 10/2017.Marland Kitchen   Echo 01/29/21: EF 45% with mild LVH & moderate valvular regurgitation. Echo 02/10/23: EF 50-55% with severe LVH/ LAE   Admitted 02/09/23 due to worsening SOB over the last several months along with worsening pedal edema. IV diuresed and lost ~ 7 liters.    He presents today for a HF f/u visit with a chief complaint of minimal SOB with moderate exertion. Chronic in nature but improved from last visit here last week. Has associated fatigue (improving), strange sensation in chest first thing in the morning, getting off balance when first stand up and some edema along with this. He says that he can't describe the sensation in his chest but says that it occurs first thing in the morning when he wakes up, resolves quickly and doesn't occur anymore during the day. Denies chest pain, palpitations, edema, abdominal distention or difficulty sleeping.   At last visit, his furosemide was increased to 80mg  daily for 3 days and potassium for 3 days. He took the potassium for the 3 days but continued the 80mg  furosemide daily and says that he feels better.   ROS: All systems negative except as listed in HPI, PMH and Problem List.  SH:  Social History   Socioeconomic History   Marital status: Married    Spouse name: Not on file   Number of children: 2   Years of education: Not on file   Highest education level: Not on file  Occupational History   Not on file  Tobacco Use   Smoking status: Never   Smokeless tobacco: Never  Vaping Use   Vaping Use: Never used  Substance and Sexual Activity   Alcohol use: No     Alcohol/week: 0.0 standard drinks of alcohol   Drug use: No   Sexual activity: Not Currently  Other Topics Concern   Not on file  Social History Narrative   Not on file   Social Determinants of Health   Financial Resource Strain: Low Risk  (03/05/2023)   Overall Financial Resource Strain (CARDIA)    Difficulty of Paying Living Expenses: Not hard at all  Food Insecurity: No Food Insecurity (03/05/2023)   Hunger Vital Sign    Worried About Running Out of Food in the Last Year: Never true    Ran Out of Food in the Last Year: Never true  Transportation Needs: No Transportation Needs (03/05/2023)   PRAPARE - Administrator, Civil Service (Medical): No    Lack of Transportation (Non-Medical): No  Physical Activity: Not on file  Stress: Not on file  Social Connections: Not on file  Intimate Partner Violence: Not At Risk (03/05/2023)   Humiliation, Afraid, Rape, and Kick questionnaire    Fear of Current or Ex-Partner: No    Emotionally Abused: No    Physically Abused: No    Sexually Abused: No    FH:  Family History  Problem Relation Age of Onset   Lung cancer Mother 68       deceased   Brain cancer Son 70       pt's only son   Polycythemia Brother  half brother   Prostate cancer Brother     Past Medical History:  Diagnosis Date   Arrhythmia    atrial fibrillation   Bladder neck obstruction    Cataracts, bilateral    CHF (congestive heart failure) (HCC)    Chronic lymphocytic leukemia (CLL), B-cell (HCC)    Lambda positive B-cell CLL   Gout    Hypercholesterolemia    Hypertension    Lymphocytosis    Sleep apnea    Thrombocytopenia (HCC)     Current Outpatient Medications  Medication Sig Dispense Refill   amLODipine (NORVASC) 2.5 MG tablet Take 2.5 mg by mouth daily.     aspirin 81 MG EC tablet Take 1 tablet by mouth daily.     carvedilol (COREG) 6.25 MG tablet Take 6.25 mg by mouth every 12 (twelve) hours.     empagliflozin (JARDIANCE) 10 MG TABS  tablet Take 1 tablet (10 mg total) by mouth daily before breakfast. (Patient not taking: Reported on 03/19/2023) 30 tablet 5   ezetimibe (ZETIA) 10 MG tablet Take 1 tablet by mouth daily.     furosemide (LASIX) 40 MG tablet Take 1 tablet (40 mg total) by mouth daily. kowalski 30 tablet 5   lisinopril (ZESTRIL) 20 MG tablet TAKE (1) TABLET BY MOUTH DAILY. 30 tablet 0   nitroGLYCERIN (NITROSTAT) 0.4 MG SL tablet Place 1 tablet (0.4 mg total) under the tongue every 5 (five) minutes as needed for chest pain. 15 tablet 0   potassium chloride SA (KLOR-CON M) 20 MEQ tablet Take 2 tablets daily for the next 3 days. 6 tablet 0   No current facility-administered medications for this visit.   Vitals:   04/15/23 1255  BP: 118/66  Pulse: 65  SpO2: 99%  Weight: 228 lb (103.4 kg)   Wt Readings from Last 3 Encounters:  04/15/23 228 lb (103.4 kg)  04/08/23 231 lb (104.8 kg)  03/19/23 230 lb 3.2 oz (104.4 kg)   Lab Results  Component Value Date   CREATININE 1.02 04/08/2023   CREATININE 1.18 02/17/2023   CREATININE 1.08 02/12/2023   PHYSICAL EXAM:  General:  Well appearing. No resp difficulty HEENT: normal Neck: supple. JVP flat. No lymphadenopathy or thryomegaly appreciated. Cor: PMI normal. Regular rate & rhythm. No rubs, gallops or murmurs. Lungs: clear Abdomen: soft, nontender, nondistended. No hepatosplenomegaly. No bruits or masses.  Extremities: no cyanosis, clubbing, rash. trace pitting edema in bilateral lower legs with L>R Neuro: alert & oriented x3, cranial nerves grossly intact. Moves all 4 extremities w/o difficulty. Affect pleasant.  ECG: not done   ASSESSMENT & PLAN:  1: NICM with preserved ejection fraction- - likely due to HTN/ AF - NYHA class II - euvolemic today - weighing daily; reminded to call for an overnight weight gain of > 2 pounds or a weekly weight gain of > 5 pounds - weight down 3 pounds from last visit here 1 week ago - Echo 02/10/23: EF 50-55% with severe  LVH/ LAE. Echo 01/29/21: EF 45% with mild LVH & moderate valvular regurgitation - not adding salt to his food and is reading food labels and trying to eat more fresh fruits/ vegs; does get meals on wheels but he says that he sometimes only eats 1/2 for lunch and the other 1/2 for supper - took potassium daily for 3 days and was supposed to take furosemide 80mg  daily just for 3 days but he's continued the 80mg  lasix daily as he felt better - BMP today - continue  carvedilol 6.25mg  BID - continue lisinopril 20mg  daily - patient had developed tailbone/ back pain with jardiance, stopped it and pain improved; will not resume at this time; if tries to re-challenge, will need patient assistance as he can't afford it - consider spironolactone at next visit - wearing compression socks daily - does elevate his legs when sitting for long periods of time - BNP 04/08/23 was 347.6 - BNP today  2: HTN w/ CKD- - BP 118/66 - saw PCP [name]) 05/24 - BMP 04/08/23 showed sodium 140, potassium 3.8, creatinine 1.02 & GFR >60 - has started PT & currently gets this weekly; does do the PT exercises the other days of the week  3: Atrial fibrillation- - Medtronic PPM 10/2017 present due to bradycardia - saw cardiology [facility]) 06/24 - currently not on anticoag due to fall risk  4: Chronic ascending thoracic aortic aneurysm- - chest CT 02/09/23 showed 4.8cm aneurysm - previous surgical evaluation said due to age/ comorbidities it was unlikely to be successfully corrected  5: CLL- - previously treated with [name]  - saw oncology [name]) 01/24  6: OSA- - wearing CPAP nightly  - reports sleeping well for 5 hours and then wakes up frequently   Return in 2 weeks, sooner if needed.

## 2023-04-15 NOTE — Telephone Encounter (Signed)
Patient was notified of lab results and medication labs for next week were entered. Patient verbalized understanding of instructions.

## 2023-04-26 ENCOUNTER — Encounter: Payer: Self-pay | Admitting: Family

## 2023-04-26 ENCOUNTER — Other Ambulatory Visit
Admission: RE | Admit: 2023-04-26 | Discharge: 2023-04-26 | Disposition: A | Discharge status: Home / Self Care | Payer: Medicare Other | Source: Ambulatory Visit | Attending: Family | Admitting: Family

## 2023-04-26 ENCOUNTER — Ambulatory Visit (HOSPITAL_BASED_OUTPATIENT_CLINIC_OR_DEPARTMENT_OTHER): Payer: Medicare Other | Admitting: Family

## 2023-04-26 VITALS — BP 95/55 | HR 68 | Wt 229.6 lb

## 2023-04-26 DIAGNOSIS — C9110 Chronic lymphocytic leukemia of B-cell type not having achieved remission: Secondary | ICD-10-CM

## 2023-04-26 DIAGNOSIS — I5032 Chronic diastolic (congestive) heart failure: Secondary | ICD-10-CM | POA: Insufficient documentation

## 2023-04-26 DIAGNOSIS — I482 Chronic atrial fibrillation, unspecified: Secondary | ICD-10-CM

## 2023-04-26 DIAGNOSIS — R079 Chest pain, unspecified: Secondary | ICD-10-CM | POA: Diagnosis not present

## 2023-04-26 DIAGNOSIS — G4733 Obstructive sleep apnea (adult) (pediatric): Secondary | ICD-10-CM | POA: Diagnosis not present

## 2023-04-26 DIAGNOSIS — I7121 Aneurysm of the ascending aorta, without rupture: Secondary | ICD-10-CM

## 2023-04-26 DIAGNOSIS — I10 Essential (primary) hypertension: Secondary | ICD-10-CM | POA: Diagnosis not present

## 2023-04-26 LAB — BASIC METABOLIC PANEL
Anion gap: 9 (ref 5–15)
BUN: 24 mg/dL — ABNORMAL HIGH (ref 8–23)
CO2: 25 mmol/L (ref 22–32)
Calcium: 9.1 mg/dL (ref 8.9–10.3)
Chloride: 106 mmol/L (ref 98–111)
Creatinine, Ser: 1.19 mg/dL (ref 0.61–1.24)
GFR, Estimated: 58 mL/min — ABNORMAL LOW (ref >60)
Glucose, Bld: 137 mg/dL — ABNORMAL HIGH (ref 70–99)
Potassium: 4.1 mmol/L (ref 3.5–5.1)
Sodium: 140 mmol/L (ref 135–145)

## 2023-04-26 LAB — CBC
HCT: 45.8 % (ref 39.0–52.0)
Hemoglobin: 14.7 g/dL (ref 13.0–17.0)
MCH: 29.1 pg (ref 26.0–34.0)
MCHC: 32.1 g/dL (ref 30.0–36.0)
MCV: 90.7 fL (ref 80.0–100.0)
Platelets: 159 10*3/uL (ref 150–400)
RBC: 5.05 MIL/uL (ref 4.22–5.81)
RDW: 14.2 % (ref 11.5–15.5)
WBC: 20.5 10*3/uL — ABNORMAL HIGH (ref 4.0–10.5)
nRBC: 0 % (ref 0.0–0.2)

## 2023-04-26 LAB — CK TOTAL AND CKMB (NOT AT ARMC)
CK, MB: 3.9 ng/mL (ref 0.5–5.0)
Total CK: 58 U/L (ref 49–397)

## 2023-04-26 LAB — TROPONIN I (HIGH SENSITIVITY): Troponin I (High Sensitivity): 35 ng/L — ABNORMAL HIGH (ref <18)

## 2023-04-26 MED ORDER — NITROGLYCERIN 0.4 MG SL SUBL: 0.4000 mg | SUBLINGUAL_TABLET | SUBLINGUAL | 0 refills | Status: AC | PRN

## 2023-04-26 NOTE — Progress Notes (Signed)
PCP: Tillie Fantasia, PA (last seen 05/24)  Primary Cardiologist: Minda Ditto, PA (last seen 12/23; returns 06/24)  HPI:  Brent Hoover is a 89 y/o male with a history of CLL, hyperlipidemia, HTN, mild AS, thoracic ascending aortic aneurysm, OSA, AF, gout, sleep apnea and chronic heart failure. Has a Medtronic PPM model AZURE [id number] due to bradycardia implanted 10/2017.Marland Kitchen   Echo 01/29/21: EF 45% with mild LVH & moderate valvular regurgitation. Echo 02/10/23: EF 50-55% with severe LVH/ LAE   Admitted 02/09/23 due to worsening SOB over the last several months along with worsening pedal edema. IV diuresed and lost ~ 7 liters.    He presents today for a HF f/u visit with a complaint of minimal fatigue with moderate exertion. Chronic in nature. Has associated edema along with this although very little. Denies cough, palpitations, abdominal distention, dizziness or difficulty sleeping.   His biggest concern is of chest tightness with exertion. Says that it usually would occur first thing in the mornings but has been occurring sometimes during the day and is becoming more frequent. Experienced this tightness walking into the office today. Resolves on own. Has SL NTG at home that he's had for years but he's never taken it and didn't think to try taking it for this tightness. Denies sweating / nausea / vomiting / arm pain during these episodes.   ROS: All systems negative except as listed in HPI, PMH and Problem List.  SH:  Social History   Socioeconomic History   Marital status: Married    Spouse name: Not on file   Number of children: 2   Years of education: Not on file   Highest education level: Not on file  Occupational History   Not on file  Tobacco Use   Smoking status: Never   Smokeless tobacco: Never  Vaping Use   Vaping Use: Never used  Substance and Sexual Activity   Alcohol use: No    Alcohol/week: 0.0 standard drinks of alcohol   Drug use: No   Sexual activity: Not Currently   Other Topics Concern   Not on file  Social History Narrative   Not on file   Social Determinants of Health   Financial Resource Strain: Low Risk  (03/05/2023)   Overall Financial Resource Strain (CARDIA)    Difficulty of Paying Living Expenses: Not hard at all  Food Insecurity: No Food Insecurity (03/05/2023)   Hunger Vital Sign    Worried About Running Out of Food in the Last Year: Never true    Ran Out of Food in the Last Year: Never true  Transportation Needs: No Transportation Needs (03/05/2023)   PRAPARE - Administrator, Civil Service (Medical): No    Lack of Transportation (Non-Medical): No  Physical Activity: Not on file  Stress: Not on file  Social Connections: Not on file  Intimate Partner Violence: Not At Risk (03/05/2023)   Humiliation, Afraid, Rape, and Kick questionnaire    Fear of Current or Ex-Partner: No    Emotionally Abused: No    Physically Abused: No    Sexually Abused: No    FH:  Family History  Problem Relation Age of Onset   Lung cancer Mother 42       deceased   Brain cancer Son 34       pt's only son   Polycythemia Brother        half brother   Prostate cancer Brother     Past Medical History:  Diagnosis  Date   Arrhythmia    atrial fibrillation   Bladder neck obstruction    Cataracts, bilateral    CHF (congestive heart failure) (HCC)    Chronic lymphocytic leukemia (CLL), B-cell (HCC)    Lambda positive B-cell CLL   Gout    Hypercholesterolemia    Hypertension    Lymphocytosis    Sleep apnea    Thrombocytopenia (HCC)     Current Outpatient Medications  Medication Sig Dispense Refill   amLODipine (NORVASC) 2.5 MG tablet Take 2.5 mg by mouth daily.     aspirin 81 MG EC tablet Take 1 tablet by mouth daily.     carvedilol (COREG) 6.25 MG tablet Take 6.25 mg by mouth every 12 (twelve) hours.     empagliflozin (JARDIANCE) 10 MG TABS tablet Take 1 tablet (10 mg total) by mouth daily before breakfast. (Patient not taking: Reported  on 03/19/2023) 30 tablet 5   ezetimibe (ZETIA) 10 MG tablet Take 1 tablet by mouth daily.     furosemide (LASIX) 80 MG tablet Take 1 tablet (80 mg total) by mouth daily. 90 tablet 3   lisinopril (ZESTRIL) 20 MG tablet TAKE (1) TABLET BY MOUTH DAILY. 30 tablet 0   nitroGLYCERIN (NITROSTAT) 0.4 MG SL tablet Place 1 tablet (0.4 mg total) under the tongue every 5 (five) minutes as needed for chest pain. 15 tablet 0   potassium chloride SA (KLOR-CON M) 20 MEQ tablet Take 1 tablet (20 mEq total) by mouth daily. 90 tablet 3   No current facility-administered medications for this visit.   Vitals:   04/26/23 1431  BP: (!) 95/55  Pulse: 68  SpO2: 98%  Weight: 229 lb 9.6 oz (104.1 kg)   Wt Readings from Last 3 Encounters:  04/26/23 229 lb 9.6 oz (104.1 kg)  04/15/23 228 lb (103.4 kg)  04/08/23 231 lb (104.8 kg)   Lab Results  Component Value Date   CREATININE 1.40 (H) 04/15/2023   CREATININE 1.02 04/08/2023   CREATININE 1.18 02/17/2023   PHYSICAL EXAM:  General:  Well appearing. No resp difficulty HEENT: normal Neck: supple. JVP flat. No lymphadenopathy or thryomegaly appreciated. Cor: PMI normal. Regular rate & rhythm. No rubs, gallops or murmurs. Lungs: clear Abdomen: soft, nontender, nondistended. No hepatosplenomegaly. No bruits or masses.  Extremities: no cyanosis, clubbing, rash. trace pitting edema in bilateral lower legs with L>R Neuro: alert & oriented x3, cranial nerves grossly intact. Moves all 4 extremities w/o difficulty. Affect pleasant. Musculoskeletal: no tenderness to palpation of anterior chest wall  ECG: v-paced rhythm today   ASSESSMENT & PLAN:  1: NICM with preserved ejection fraction- - likely due to HTN/ AF - NYHA class II - euvolemic today - weighing daily; reminded to call for an overnight weight gain of > 2 pounds or a weekly weight gain of > 5 pounds - weight up 1.6 pounds from last visit here 2 weeks ago - Echo 02/10/23: EF 50-55% with severe LVH/ LAE.  Echo 01/29/21: EF 45% with mild LVH & moderate valvular regurgitation - not adding salt to his food and is reading food labels and trying to eat more fresh fruits/ vegs; does get meals on wheels but he says that he sometimes only eats 1/2 for lunch and the other 1/2 for supper - continue furosemide 80mg  daily  - continue potassium daily - continue carvedilol 6.25mg  BID - continue lisinopril 20mg  daily - BMP today - patient had developed tailbone/ back pain with jardiance, stopped it and pain improved; will  not resume at this time; if tries to re-challenge, will need patient assistance as he can't afford it - consider spironolactone  - wearing compression socks daily - does elevate his legs when sitting for long periods of time - BNP 04/15/23 was 254.3  2: HTN w/ CKD- - BP 95/55; consider stopping amlodipine - saw PCP Brent Hoover) 05/24 - BMP 04/15/23 showed sodium 136, potassium 3.8, creatinine 1.4 & GFR 48 - BMP today - has started PT & currently gets this weekly; does do the PT exercises the other days of the week  3: Atrial fibrillation- - Medtronic PPM 10/2017 present due to bradycardia - saw cardiology Brent Hoover @ [facility]) 06/24 - currently not on anticoag due to fall risk  4: Chronic ascending thoracic aortic aneurysm- - chest CT 02/09/23 showed 4.8cm aneurysm - previous surgical evaluation said due to age/ comorbidities it was unlikely to be successfully corrected  5: CLL- - previously treated with Brent Hoover  - saw oncology Brent Hoover) 01/24  6: OSA- - wearing CPAP nightly  - reports sleeping well for 5 hours and then wakes up frequently  .  7: Chest pain- - EKG done today shows Vpaced rhythm  - RX for SL NTG provided and reviewed how to use the medication - troponin, CBC & Ck-MB drawn today - should this tightness worsen or he develop other symptoms with it, he needs to call 911  Return in 2 weeks, sooner if needed.

## 2023-04-26 NOTE — Patient Instructions (Signed)
When you feel the chest tightness, you can put 1 nitroglycerin on your tongue. If your chest still is tight after 5 minutes, you can repeat this for a total of 3 times. If the chest is still tight after the 3rd dose, you should call 911.    After you get your labs drawn, tell them you need to return to the [facility] entrance

## 2023-04-27 ENCOUNTER — Encounter: Payer: Self-pay | Admitting: Family

## 2023-04-27 NOTE — Addendum Note (Signed)
Addended by: Electa Sniff on: 04/27/2023 08:54 AM   Modules accepted: Orders

## 2023-04-27 NOTE — Telephone Encounter (Addendum)
Pt aware, agreeable, and verbalized understanding. Pt verbalized will stop the amlodipine. Education given with teach back.   ----- Message from Delma Freeze, FNP sent at 04/27/2023  8:24 AM EDT ----- Sodium, potassium and kidney function all look great. No signs of being anemic. Heart enzymes are within your normal range. Let's stop the amlodipine as this chest tightness seems to have increased in frequency as your blood pressure has declined.

## 2023-05-11 DIAGNOSIS — I10 Essential (primary) hypertension: Secondary | ICD-10-CM | POA: Diagnosis not present

## 2023-05-11 DIAGNOSIS — Z950 Presence of cardiac pacemaker: Secondary | ICD-10-CM | POA: Diagnosis not present

## 2023-05-11 DIAGNOSIS — I5022 Chronic systolic (congestive) heart failure: Secondary | ICD-10-CM | POA: Diagnosis not present

## 2023-05-11 DIAGNOSIS — I482 Chronic atrial fibrillation, unspecified: Secondary | ICD-10-CM | POA: Diagnosis not present

## 2023-05-11 DIAGNOSIS — E782 Mixed hyperlipidemia: Secondary | ICD-10-CM | POA: Diagnosis not present

## 2023-05-11 DIAGNOSIS — I7121 Aneurysm of the ascending aorta, without rupture: Secondary | ICD-10-CM | POA: Diagnosis not present

## 2023-05-11 DIAGNOSIS — I2510 Atherosclerotic heart disease of native coronary artery without angina pectoris: Secondary | ICD-10-CM | POA: Diagnosis not present

## 2023-05-11 DIAGNOSIS — R0789 Other chest pain: Secondary | ICD-10-CM | POA: Diagnosis not present

## 2023-05-12 ENCOUNTER — Inpatient Hospital Stay: Payer: Medicare Other | Attending: Oncology

## 2023-05-12 ENCOUNTER — Encounter: Payer: Medicare Other | Admitting: Family

## 2023-05-12 ENCOUNTER — Encounter: Payer: Self-pay | Admitting: Oncology

## 2023-05-12 ENCOUNTER — Inpatient Hospital Stay (HOSPITAL_BASED_OUTPATIENT_CLINIC_OR_DEPARTMENT_OTHER): Payer: Medicare Other | Admitting: Oncology

## 2023-05-12 VITALS — BP 120/93 | HR 64 | Wt 228.0 lb

## 2023-05-12 DIAGNOSIS — Z79899 Other long term (current) drug therapy: Secondary | ICD-10-CM | POA: Insufficient documentation

## 2023-05-12 DIAGNOSIS — I130 Hypertensive heart and chronic kidney disease with heart failure and stage 1 through stage 4 chronic kidney disease, or unspecified chronic kidney disease: Secondary | ICD-10-CM | POA: Insufficient documentation

## 2023-05-12 DIAGNOSIS — Z801 Family history of malignant neoplasm of trachea, bronchus and lung: Secondary | ICD-10-CM | POA: Diagnosis not present

## 2023-05-12 DIAGNOSIS — Z7982 Long term (current) use of aspirin: Secondary | ICD-10-CM | POA: Insufficient documentation

## 2023-05-12 DIAGNOSIS — N1831 Chronic kidney disease, stage 3a: Secondary | ICD-10-CM

## 2023-05-12 DIAGNOSIS — C9110 Chronic lymphocytic leukemia of B-cell type not having achieved remission: Secondary | ICD-10-CM

## 2023-05-12 DIAGNOSIS — Z808 Family history of malignant neoplasm of other organs or systems: Secondary | ICD-10-CM | POA: Diagnosis not present

## 2023-05-12 DIAGNOSIS — Z8042 Family history of malignant neoplasm of prostate: Secondary | ICD-10-CM | POA: Diagnosis not present

## 2023-05-12 DIAGNOSIS — N183 Chronic kidney disease, stage 3 unspecified: Secondary | ICD-10-CM | POA: Insufficient documentation

## 2023-05-12 LAB — CBC WITH DIFFERENTIAL/PLATELET
Abs Immature Granulocytes: 0.07 10*3/uL (ref 0.00–0.07)
Basophils Absolute: 0.1 10*3/uL (ref 0.0–0.1)
Basophils Relative: 1 %
Eosinophils Absolute: 0.3 10*3/uL (ref 0.0–0.5)
Eosinophils Relative: 2 %
HCT: 46.4 % (ref 39.0–52.0)
Hemoglobin: 15.2 g/dL (ref 13.0–17.0)
Immature Granulocytes: 0 %
Lymphocytes Relative: 59 %
Lymphs Abs: 11.2 10*3/uL — ABNORMAL HIGH (ref 0.7–4.0)
MCH: 29.7 pg (ref 26.0–34.0)
MCHC: 32.8 g/dL (ref 30.0–36.0)
MCV: 90.6 fL (ref 80.0–100.0)
Monocytes Absolute: 1.5 10*3/uL — ABNORMAL HIGH (ref 0.1–1.0)
Monocytes Relative: 8 %
Neutro Abs: 5.7 10*3/uL (ref 1.7–7.7)
Neutrophils Relative %: 30 %
Platelets: 146 10*3/uL — ABNORMAL LOW (ref 150–400)
RBC: 5.12 MIL/uL (ref 4.22–5.81)
RDW: 14.1 % (ref 11.5–15.5)
Smear Review: NORMAL
WBC: 18.9 10*3/uL — ABNORMAL HIGH (ref 4.0–10.5)
nRBC: 0 % (ref 0.0–0.2)

## 2023-05-12 LAB — COMPREHENSIVE METABOLIC PANEL
ALT: 12 U/L (ref 0–44)
AST: 19 U/L (ref 15–41)
Albumin: 4.3 g/dL (ref 3.5–5.0)
Alkaline Phosphatase: 42 U/L (ref 38–126)
Anion gap: 10 (ref 5–15)
BUN: 23 mg/dL (ref 8–23)
CO2: 28 mmol/L (ref 22–32)
Calcium: 8.9 mg/dL (ref 8.9–10.3)
Chloride: 101 mmol/L (ref 98–111)
Creatinine, Ser: 1.21 mg/dL (ref 0.61–1.24)
GFR, Estimated: 57 mL/min — ABNORMAL LOW (ref >60)
Glucose, Bld: 134 mg/dL — ABNORMAL HIGH (ref 70–99)
Potassium: 4.3 mmol/L (ref 3.5–5.1)
Sodium: 139 mmol/L (ref 135–145)
Total Bilirubin: 1 mg/dL (ref 0.3–1.2)
Total Protein: 6.6 g/dL (ref 6.5–8.1)

## 2023-05-12 LAB — LACTATE DEHYDROGENASE: LDH: 143 U/L (ref 98–192)

## 2023-05-12 NOTE — Assessment & Plan Note (Addendum)
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 Physical examination showed no palpable adenopathy  Labs reviewed and discussed with patient Mild leukocytosis.  Currently at 20 No constitutional symptoms Recommend continue watchful waiting.  Given his other medical problems advanced age, I recommend to follow-up in 1 year.  Advised patient to call our office if he has developed any new constitutional symptoms.

## 2023-05-12 NOTE — Assessment & Plan Note (Signed)
Encourage oral hydration and avoid nephrotoxins.   

## 2023-05-12 NOTE — Progress Notes (Signed)
Hematology/Oncology Progress note Telephone:([phone] Fax:([phone]    Chief Complaint: Brent Hoover is a 89 y.o. 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 Physical examination showed no palpable adenopathy  Labs reviewed and discussed with patient Mild leukocytosis.  Currently at 20 No constitutional symptoms Recommend continue watchful waiting.  Given his other medical problems advanced age, I recommend to follow-up in 1 year.  Advised patient to call our office if he has developed any new constitutional symptoms.  CKD (chronic kidney disease), stage III (HCC) Encourage oral hydration and avoid nephrotoxins.   Orders Placed This Encounter  Procedures   CBC with Differential (Cancer Center Only)    Standing Status:   Future    Standing Expiration Date:   05/11/2024   CMP (Cancer Center only)    Standing Status:   Future    Standing Expiration Date:   05/11/2024   Lactate dehydrogenase    Standing Status:   Future    Standing Expiration Date:   05/11/2024   Follow up in 6 months.  All questions were answered. The patient knows to call the clinic with any problems, questions or concerns.  Rickard Patience, MD, PhD [facility] Hematology Oncology 05/12/2023   PERTINENT ONCOLOGY HISTORY Brent Hoover is a 89 y.o.[place] who has above oncology history reviewed by me today presented for follow up visit for CLL. Patient previously followed up by Dr.Corcoran, patient switched care to me on 04/17/21 Extensive medical record review was performed by me  Chronic lymphocytic leukemia 08/06/2015, establish care with Dr. Donneta Romberg.  At that time patient was symptomatic and was on watchful waiting. 11/05/2015  Flow cytometry revealed CLL, B-cell, CD38 negative. 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 07/27/2017, presented with constitutional symptoms with 30 to 40 pounds unintentional weight loss in 6 months, night sweats.  Early satiety,LLQ abdominal discomfort.  07/29/2017 CT abdomen and pelvis reviewed splenomegaly, lymphadenopathy in the abdominal and pelvis.  Stranding along with some omental adipose tissue.  Left lower quadrant.  08/04/2017 - 01/05/2018. Gazyva (obinutuzumab) monthly x 6  03/04/2018   Abdomen and pelvis CT on  revealed improved splenomegaly (480 cc) and resolved adenopathy.  He has a developmental venous variant (double IVC).  Subsequently patient has been on surveillance every 3 months.    03/31/2019 - 04/03/2019 admission due to shortness of breath and a left sided pleuraleffusion.  Chest CT on 04/03/2019 revealed a moderate loculated left pleural effusion with no mediastinal, hilar or axillary adenopathy.  Left thoracentesis on 04/03/2019 revealed 900 cc of fluid c/w a transudate.  Cytology was negative.  There were reactive mesothelial cells and predominant lymphocytes.  The majority of the lymphocytes stained with CD5 (T-cell marker).  There was no co-expression of PAX 5 (B-cell marker), CD5, and CD23.  Gram stain and cultures were negative.   Additional thoracentesis 06/252020, 05/23/2019 for a recurrent left pleural effusion.  Cytology on 05/23/2019 was negative for malignancy.  Sample had predominant small lymphocytes, few neutrophils, macrophages and eosinophils.  Flow cytometry revealed no phenotypically normal T lymphocytes comprising 98% of the cells.  There is a very small population of neoplastic B cells present, < 1% that were CD5+, CD20 23+, CD43+ and consistent with a prior diagnosis of CLL.  It seems unlikely that the small population of cells was causing  the recurrent effusion.     11/20/2019 CT chest angiogram revealed no evidence of pulmonary embolus.  There was a small left pleural effusion with associated pleural thickening and passive atelectasis  in the left lower lobe.  # Left sided pleural effusion-  Etiology was secondary to cardiopulmonary issues. Thoracentesis on 05/23/2019 revealed a very small population of neoplastic B cells likely due to contamination from blood (fluid bloody).     INTERVAL HISTORY Brent Hoover is a 89 y.o. male who has above history reviewed by me today presents for follow up visit for CLL He has no new complaints.  Denies unintentional weight loss, night sweats, fever. Weight is stable  He has good appetite.  Past Medical History:  Diagnosis Date   Arrhythmia    atrial fibrillation   Bladder neck obstruction    Cataracts, bilateral    CHF (congestive heart failure) (HCC)    Chronic lymphocytic leukemia (CLL), B-cell (HCC)    Lambda positive B-cell CLL   Gout    Hypercholesterolemia    Hypertension    Lymphocytosis    Sleep apnea    Thrombocytopenia (HCC)     Past Surgical History:  Procedure Laterality Date   COLONOSCOPY WITH PROPOFOL N/A 09/10/2015   Procedure: COLONOSCOPY WITH PROPOFOL;  Surgeon: Midge Minium, MD;  Location: [facility] ENDOSCOPY;  Service: Endoscopy;  Laterality: N/A;   PROSTATE SURGERY  2008    Family History  Problem Relation Age of Onset   Lung cancer Mother 69       deceased   Brain cancer Son 58       pt's only son   Polycythemia Brother        half brother   Prostate cancer Brother     Social History:  reports that he has never smoked. He has never used smokeless tobacco. He reports that he does not drink alcohol and does not use drugs.    Allergies:  Allergies  Allergen Reactions   Penicillins Other (See Comments)    Has patient had a PCN reaction causing immediate rash, facial/tongue/throat swelling, SOB or lightheadedness with hypotension: Unknown Has patient had a PCN reaction causing severe rash involving mucus membranes or skin necrosis: Unknown Has patient had a PCN reaction that required hospitalization: Unknown Has patient had a PCN reaction  occurring within the last 10 years: Unknown If all of the above answers are "NO", then may proceed with Cephalosporin use.   Atorvastatin Rash    Other reaction(s): Muscle pain Other reaction(s): Muscle pain   Terazosin Rash    Other reaction(s): Feeling faint Other reaction(s): Feeling faint    Current Medications: Current Outpatient Medications  Medication Sig Dispense Refill   aspirin 81 MG EC tablet Take 1 tablet by mouth daily.     carvedilol (COREG) 6.25 MG tablet Take 6.25 mg by mouth every 12 (twelve) hours.     ezetimibe (ZETIA) 10 MG tablet Take 1 tablet by mouth daily.     furosemide (LASIX) 80 MG tablet Take 1 tablet (80 mg total) by mouth daily. 90 tablet 3   lisinopril (ZESTRIL) 20 MG tablet TAKE (1) TABLET BY MOUTH DAILY. 30 tablet 0   nitroGLYCERIN (NITROSTAT) 0.4 MG SL tablet Place 1 tablet (0.4 mg total) under the tongue every 5 (five) minutes as needed for chest pain. 15 tablet 0   potassium chloride SA (KLOR-CON M) 20 MEQ tablet Take 1 tablet (20 mEq total) by mouth daily. 90 tablet 3   No current facility-administered  medications for this visit.   Review of Systems  Constitutional:  Positive for fatigue. Negative for appetite change, chills, fever and unexpected weight change.  HENT:   Negative for hearing loss and voice change.   Eyes:  Negative for eye problems and icterus.  Respiratory:  Negative for chest tightness, cough and shortness of breath.   Cardiovascular:  Negative for chest pain and leg swelling.       + Pacemaker  Gastrointestinal:  Negative for abdominal distention and abdominal pain.  Endocrine: Negative for hot flashes.  Genitourinary:  Negative for difficulty urinating, dysuria and frequency.   Musculoskeletal:  Positive for gait problem. Negative for arthralgias.  Skin:  Negative for itching and rash.  Neurological:  Positive for gait problem. Negative for light-headedness and numbness.  Hematological:  Negative for adenopathy. Does not  bruise/bleed easily.  Psychiatric/Behavioral:  Negative for confusion.     Performance status (ECOG): 1-2  Vitals Blood pressure (!) 120/93, pulse 64, weight 228 lb (103.4 kg), SpO2 98 %.   Physical Exam Constitutional:      General: He is not in acute distress.    Appearance: He is obese. He is not diaphoretic.  Eyes:     General: No scleral icterus. Cardiovascular:     Rate and Rhythm: Normal rate.     Heart sounds: No murmur heard. Pulmonary:     Effort: Pulmonary effort is normal. No respiratory distress.     Breath sounds: Normal breath sounds. No wheezing.  Abdominal:     General: Bowel sounds are normal. There is no distension.     Palpations: Abdomen is soft.  Musculoskeletal:        General: Normal range of motion.     Cervical back: Normal range of motion and neck supple.  Lymphadenopathy:     Cervical: No cervical adenopathy.  Skin:    General: Skin is warm and dry.  Neurological:     Mental Status: He is alert and oriented to person, place, and time. Mental status is at baseline.     Cranial Nerves: No cranial nerve deficit.     Motor: No abnormal muscle tone.  Psychiatric:        Mood and Affect: Mood and affect normal.     Labs    Latest Ref Rng & Units 05/12/2023    1:30 PM 04/26/2023    3:09 PM 03/05/2023    3:43 PM  CBC  WBC 4.0 - 10.5 K/uL 18.9  20.5  19.2   Hemoglobin 13.0 - 17.0 g/dL 16.1  09.6  04.5   Hematocrit 39.0 - 52.0 % 46.4  45.8  48.3   Platelets 150 - 400 K/uL 146  159  161       Latest Ref Rng & Units 05/12/2023    1:30 PM 04/26/2023    3:09 PM 04/15/2023    1:30 PM  CMP  Glucose 70 - 99 mg/dL 409  811  99   BUN 8 - 23 mg/dL 23  24  31    Creatinine 0.61 - 1.24 mg/dL 9.14  7.82  9.56   Sodium 135 - 145 mmol/L 139  140  136   Potassium 3.5 - 5.1 mmol/L 4.3  4.1  3.8   Chloride 98 - 111 mmol/L 101  106  101   CO2 22 - 32 mmol/L 28  25  25    Calcium 8.9 - 10.3 mg/dL 8.9  9.1  8.6   Total Protein 6.5 - 8.1 g/dL 6.6  Total  Bilirubin 0.3 - 1.2 mg/dL 1.0     Alkaline Phos 38 - 126 U/L 42     AST 15 - 41 U/L 19     ALT 0 - 44 U/L 12

## 2023-05-14 ENCOUNTER — Encounter: Payer: Self-pay | Admitting: Family

## 2023-05-14 ENCOUNTER — Ambulatory Visit: Payer: Medicare Other | Attending: Family | Admitting: Family

## 2023-05-14 VITALS — BP 132/86 | HR 88 | Ht 69.0 in | Wt 213.0 lb

## 2023-05-14 DIAGNOSIS — Z801 Family history of malignant neoplasm of trachea, bronchus and lung: Secondary | ICD-10-CM | POA: Diagnosis not present

## 2023-05-14 DIAGNOSIS — I482 Chronic atrial fibrillation, unspecified: Secondary | ICD-10-CM

## 2023-05-14 DIAGNOSIS — I509 Heart failure, unspecified: Secondary | ICD-10-CM | POA: Insufficient documentation

## 2023-05-14 DIAGNOSIS — N189 Chronic kidney disease, unspecified: Secondary | ICD-10-CM | POA: Insufficient documentation

## 2023-05-14 DIAGNOSIS — G4733 Obstructive sleep apnea (adult) (pediatric): Secondary | ICD-10-CM | POA: Diagnosis not present

## 2023-05-14 DIAGNOSIS — I130 Hypertensive heart and chronic kidney disease with heart failure and stage 1 through stage 4 chronic kidney disease, or unspecified chronic kidney disease: Secondary | ICD-10-CM | POA: Insufficient documentation

## 2023-05-14 DIAGNOSIS — I4891 Unspecified atrial fibrillation: Secondary | ICD-10-CM | POA: Insufficient documentation

## 2023-05-14 DIAGNOSIS — I428 Other cardiomyopathies: Secondary | ICD-10-CM | POA: Insufficient documentation

## 2023-05-14 DIAGNOSIS — I5032 Chronic diastolic (congestive) heart failure: Secondary | ICD-10-CM | POA: Diagnosis not present

## 2023-05-14 DIAGNOSIS — I10 Essential (primary) hypertension: Secondary | ICD-10-CM

## 2023-05-14 DIAGNOSIS — Z8042 Family history of malignant neoplasm of prostate: Secondary | ICD-10-CM | POA: Insufficient documentation

## 2023-05-14 DIAGNOSIS — R079 Chest pain, unspecified: Secondary | ICD-10-CM | POA: Insufficient documentation

## 2023-05-14 DIAGNOSIS — I7121 Aneurysm of the ascending aorta, without rupture: Secondary | ICD-10-CM | POA: Diagnosis not present

## 2023-05-14 DIAGNOSIS — C9110 Chronic lymphocytic leukemia of B-cell type not having achieved remission: Secondary | ICD-10-CM | POA: Insufficient documentation

## 2023-05-14 DIAGNOSIS — Z79899 Other long term (current) drug therapy: Secondary | ICD-10-CM | POA: Insufficient documentation

## 2023-05-14 DIAGNOSIS — Z808 Family history of malignant neoplasm of other organs or systems: Secondary | ICD-10-CM | POA: Diagnosis not present

## 2023-05-14 NOTE — Progress Notes (Signed)
PCP: Brent Fantasia, PA (last seen 05/24)  Primary Cardiologist: Brent Schwab, PA (last seen 07/24)  HPI:  [name] is a 89 y/o male with a history of CLL, hyperlipidemia, HTN, mild AS, thoracic ascending aortic aneurysm, OSA, AF, gout, sleep apnea and chronic heart failure. 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   Admitted 02/09/23 due to worsening SOB over the last several months along with worsening pedal edema. IV diuresed and lost ~ 7 liters.  Echo 01/29/21: EF 45% with mild LVH & moderate valvular regurgitation.   Echo 02/10/23: EF 50-55% with severe LVH/ LAE    He presents today for a HF f/u visit with a complaint of minimal fatigue with moderate exertion. Chronic in nature. Has associated chest tighness/ "sinking feeling" across chest when waking with occasional radionation to arms.. Resolves on its own after a few minutes of sitting there. It will occasionally occur during the day with exertion but, again, resolves on its own. No change in this since stopping the amlodipine. Has occasional dizziness and left ankle edema along with this.   Has seen cardiology since last here and has a stress test scheduled on 06/01/23  At last visit, amlodipine was stopped but chest pain/ tightness has remained.   ROS: All systems negative except as listed in HPI, PMH and Problem List.  SH:  Social History   Socioeconomic History   Marital status: Married    Spouse name: Not on file   Number of children: 2   Years of education: Not on file   Highest education level: Not on file  Occupational History   Not on file  Tobacco Use   Smoking status: Never   Smokeless tobacco: Never  Vaping Use   Vaping status: Never Used  Substance and Sexual Activity   Alcohol use: No    Alcohol/week: 0.0 standard drinks of alcohol   Drug use: No   Sexual activity: Not Currently  Other Topics Concern   Not on file  Social History Narrative   Not on file    Social Determinants of Health   Financial Resource Strain: Low Risk  (03/05/2023)   Overall Financial Resource Strain (CARDIA)    Difficulty of Paying Living Expenses: Not hard at all  Food Insecurity: No Food Insecurity (03/05/2023)   Hunger Vital Sign    Worried About Running Out of Food in the Last Year: Never true    Ran Out of Food in the Last Year: Never true  Transportation Needs: No Transportation Needs (03/05/2023)   PRAPARE - Administrator, Civil Service (Medical): No    Lack of Transportation (Non-Medical): No  Physical Activity: Not on file  Stress: Not on file  Social Connections: Not on file  Intimate Partner Violence: Not At Risk (03/05/2023)   Humiliation, Afraid, Rape, and Kick questionnaire    Fear of Current or Ex-Partner: No    Emotionally Abused: No    Physically Abused: No    Sexually Abused: No    FH:  Family History  Problem Relation Age of Onset   Lung cancer Mother 108       deceased   Brain cancer Son 74       pt's only son   Polycythemia Brother        half brother   Prostate cancer Brother     Past Medical History:  Diagnosis Date   Arrhythmia    atrial fibrillation   Bladder neck obstruction  Cataracts, bilateral    CHF (congestive heart failure) (HCC)    Chronic lymphocytic leukemia (CLL), B-cell (HCC)    Lambda positive B-cell CLL   Gout    Hypercholesterolemia    Hypertension    Lymphocytosis    Sleep apnea    Thrombocytopenia (HCC)     Current Outpatient Medications  Medication Sig Dispense Refill   aspirin 81 MG EC tablet Take 1 tablet by mouth daily.     carvedilol (COREG) 6.25 MG tablet Take 6.25 mg by mouth every 12 (twelve) hours.     ezetimibe (ZETIA) 10 MG tablet Take 1 tablet by mouth daily.     furosemide (LASIX) 80 MG tablet Take 1 tablet (80 mg total) by mouth daily. 90 tablet 3   lisinopril (ZESTRIL) 20 MG tablet TAKE (1) TABLET BY MOUTH DAILY. 30 tablet 0   nitroGLYCERIN (NITROSTAT) 0.4 MG SL tablet  Place 1 tablet (0.4 mg total) under the tongue every 5 (five) minutes as needed for chest pain. 15 tablet 0   potassium chloride SA (KLOR-CON M) 20 MEQ tablet Take 1 tablet (20 mEq total) by mouth daily. 90 tablet 3   No current facility-administered medications for this visit.   Vitals:   05/14/23 1410  BP: 132/86  Pulse: 88  SpO2: 97%  Weight: 213 lb (96.6 kg)  Height: 5\' 9"  (1.753 m)   Wt Readings from Last 3 Encounters:  05/14/23 213 lb (96.6 kg)  05/12/23 228 lb (103.4 kg)  04/26/23 229 lb 9.6 oz (104.1 kg)   Lab Results  Component Value Date   CREATININE 1.21 05/12/2023   CREATININE 1.19 04/26/2023   CREATININE 1.40 (H) 04/15/2023   PHYSICAL EXAM:  General:  Well appearing. No resp difficulty HEENT: normal Neck: supple. JVP flat. No lymphadenopathy or thryomegaly appreciated. Cor: PMI normal. Regular rate & rhythm. No rubs, gallops or murmurs. Lungs: clear Abdomen: soft, nontender, nondistended. No hepatosplenomegaly. No bruits or masses.  Extremities: no cyanosis, clubbing, rash. trace pitting edema in bilateral lower legs with L>R Neuro: alert & oriented x3, cranial nerves grossly intact. Moves all 4 extremities w/o difficulty. Affect pleasant. Musculoskeletal: no tenderness to palpation of anterior chest wall  ECG: not done   ASSESSMENT & PLAN:  1: NICM with preserved ejection fraction- - likely due to HTN/ AF - NYHA class II - euvolemic today - weighing daily; reminded to call for an overnight weight gain of > 2 pounds or a weekly weight gain of > 5 pounds - weight down 16 pounds from last visit here 3 weeks ago although question the accuracy of today's weight (213 lbs) - Echo 01/29/21: EF 45% with mild LVH & moderate valvular regurgitation - Echo 02/10/23: EF 50-55% with severe LVH/ LAE.  - not adding salt to his food and is reading food labels and trying to eat more fresh fruits/ vegs; does get meals on wheels but he says that he sometimes only eats 1/2 for  lunch and the other 1/2 for supper - continue furosemide 80mg  daily  - continue potassium daily - continue carvedilol 6.25mg  BID - continue lisinopril 20mg  daily - patient had developed tailbone/ back pain with jardiance, stopped it and pain improved; will not resume at this time; if tries to re-challenge, will need patient assistance as he can't afford it - could consider spironolactone in the future - wearing compression socks most days unless he's going to be at home and can sit and elevate his legs - does elevate his legs  when sitting for long periods of time - BNP 04/15/23 was 254.3  2: HTN w/ CKD- - BP 132/86 - saw PCP Brent Hoover) 05/24 - BMP 05/12/23 showed sodium 139, potassium 4.3, creatinine 1.21 & GFR 57  3: Atrial fibrillation- - Medtronic PPM 10/2017 present due to bradycardia - saw cardiology Brent Hoover) @ [facility]) 07/24 - currently not on anticoag due to fall risk  4: Chronic ascending thoracic aortic aneurysm- - chest CT 02/09/23 showed 4.8cm aneurysm - previous surgical evaluation said due to age/ comorbidities it was unlikely to be successfully corrected  5: CLL- - previously treated with Brent Hoover  - saw oncology Brent Hoover) 07/24  6: OSA- - wearing CPAP nightly  - reports sleeping well for 5 hours and then wakes up frequently   7: Chest pain- - has seen cardiology since last here - has stress test scheduled for later this month  Return in 3 months, sooner if needed.

## 2023-06-02 DIAGNOSIS — I5022 Chronic systolic (congestive) heart failure: Secondary | ICD-10-CM | POA: Diagnosis not present

## 2023-06-02 DIAGNOSIS — I4891 Unspecified atrial fibrillation: Secondary | ICD-10-CM | POA: Diagnosis not present

## 2023-06-02 DIAGNOSIS — R0789 Other chest pain: Secondary | ICD-10-CM | POA: Diagnosis not present

## 2023-06-02 DIAGNOSIS — I2510 Atherosclerotic heart disease of native coronary artery without angina pectoris: Secondary | ICD-10-CM | POA: Diagnosis not present

## 2023-06-04 ENCOUNTER — Ambulatory Visit: Payer: Medicare Other | Admitting: Physician Assistant

## 2023-06-04 ENCOUNTER — Encounter: Payer: Self-pay | Admitting: Physician Assistant

## 2023-06-04 VITALS — BP 124/62 | HR 88 | Temp 98.4°F | Ht 69.0 in | Wt 230.0 lb

## 2023-06-04 DIAGNOSIS — N1831 Chronic kidney disease, stage 3a: Secondary | ICD-10-CM | POA: Diagnosis not present

## 2023-06-04 DIAGNOSIS — I482 Chronic atrial fibrillation, unspecified: Secondary | ICD-10-CM

## 2023-06-04 DIAGNOSIS — I5032 Chronic diastolic (congestive) heart failure: Secondary | ICD-10-CM

## 2023-06-04 DIAGNOSIS — I700 Atherosclerosis of aorta: Secondary | ICD-10-CM

## 2023-06-04 DIAGNOSIS — C9110 Chronic lymphocytic leukemia of B-cell type not having achieved remission: Secondary | ICD-10-CM | POA: Diagnosis not present

## 2023-06-04 DIAGNOSIS — I110 Hypertensive heart disease with heart failure: Secondary | ICD-10-CM

## 2023-06-04 NOTE — Patient Instructions (Signed)
-  It was a pleasure to see you today! Please review your visit summary for helpful information -I would encourage you to follow your care via MyChart where you can access lab results, notes, messages, and more -If you feel that we did a nice job today, please complete your after-visit survey and leave [place] a Google review! Your CMA today was Mariann Barter and your provider was Alvester Morin, PA-C, DMSc -Please return for follow-up in about 3 months

## 2023-06-04 NOTE — Progress Notes (Unsigned)
Date:  06/04/2023   Name:  Brent Hoover   DOB:  1930/06/03   MRN:  [id number]   Chief Complaint: Hypertension  HPI Brent Hoover returns for routine follow-up on chronic conditions, mainly managed by specialists, seems to be doing quite well today as he states "this is the best I've felt for some time!". Says cardiology told him his heart "was not keeping up" so the pacemaker took over, but they  recently reset something in his pacemaker and he feels much better. Just had stress echo 06/02/23  He does not need any medication refills today.   He continues to follow with oncology for CLL, leukocytosis seems stable on last labs.   Oncology Dr. [name] - last 05/12/23, next 05/11/24 Cardiology Caralyn Hudson PA-C - last 05/11/23, next 06/08/23 Heart Failure Clinic [facility] FNP - last 05/14/23, next 08/17/23  Medication list has been reviewed and updated.  Current Meds  Medication Sig   aspirin 81 MG EC tablet Take 1 tablet by mouth daily.   carvedilol (COREG) 6.25 MG tablet Take 6.25 mg by mouth every 12 (twelve) hours.   ezetimibe (ZETIA) 10 MG tablet Take 1 tablet by mouth daily.   furosemide (LASIX) 80 MG tablet Take 1 tablet (80 mg total) by mouth daily.   lisinopril (ZESTRIL) 20 MG tablet TAKE (1) TABLET BY MOUTH DAILY.   Multiple Vitamins-Minerals (PRESERVISION AREDS 2 PO) Take by mouth.   nitroGLYCERIN (NITROSTAT) 0.4 MG SL tablet Place 1 tablet (0.4 mg total) under the tongue every 5 (five) minutes as needed for chest pain.   potassium chloride SA (KLOR-CON M) 20 MEQ tablet Take 1 tablet (20 mEq total) by mouth daily.     Review of Systems  Constitutional:  Negative for fatigue and fever.  Respiratory:  Negative for chest tightness and shortness of breath.   Cardiovascular:  Negative for chest pain and palpitations.  Gastrointestinal:  Negative for abdominal pain.    Patient Active Problem List   Diagnosis Date Noted   Irregular heart rate 03/05/2023   Macular degeneration, left  eye 03/05/2023   CHF (congestive heart failure), NYHA class III, chronic, diastolic (HCC) 02/10/2023   Hypocalcemia 05/11/2022   OSA (obstructive sleep apnea) 04/17/2021   Mild aortic stenosis 02/05/2021   Moderate tricuspid regurgitation 02/05/2021   Presence of permanent cardiac pacemaker 02/05/2021   History of colon polyps 01/29/2021   Gout 01/29/2021   Hypertrophy of prostate without urinary obstruction and other lower urinary tract symptoms (LUTS) 01/29/2021   Obesity 01/29/2021   Other heart block 01/29/2021   Shoulder joint pain 01/29/2021   Vitamin D deficiency 01/29/2021   Non-rheumatic mitral regurgitation 01/20/2021   LVH (left ventricular hypertrophy) due to hypertensive disease, with heart failure (HCC) 06/05/2020   Thoracic ascending aortic aneurysm 12/06/2019   Atherosclerosis of aorta (HCC) 04/06/2019   Chronic atrial fibrillation (HCC) 04/06/2019   HLD (hyperlipidemia) 03/31/2019   HTN (hypertension) 03/31/2019   CKD (chronic kidney disease), stage III (HCC) 03/31/2019   Hyponatremia 05/20/2018   Gastro-esophageal reflux disease without esophagitis 10/19/2017   Splenomegaly 07/27/2017   CLL (chronic lymphocytic leukemia) (HCC) 04/30/2015    Allergies  Allergen Reactions   Penicillins Other (See Comments)    Has patient had a PCN reaction causing immediate rash, facial/tongue/throat swelling, SOB or lightheadedness with hypotension: Unknown Has patient had a PCN reaction causing severe rash involving mucus membranes or skin necrosis: Unknown Has patient had a PCN reaction that required hospitalization: Unknown Has patient had  a PCN reaction occurring within the last 10 years: Unknown If all of the above answers are "NO", then may proceed with Cephalosporin use.   Atorvastatin Rash    Other reaction(s): Muscle pain Other reaction(s): Muscle pain   Terazosin Rash    Other reaction(s): Feeling faint Other reaction(s): Feeling faint    Immunization History   Administered Date(s) Administered   Fluad Quad(high Dose 65+) 07/19/2019   Influenza Nasal 08/10/2017   Influenza, High Dose Seasonal PF 10/15/2015, 08/18/2016, 10/04/2018, 09/30/2020   Influenza, Seasonal, Injecte, Preservative Fre 12/31/2009, 08/21/2010, 07/13/2011, 08/22/2012, 10/06/2013   Influenza-Unspecified 08/02/2000, 08/11/2000, 08/02/2001, 08/16/2002, 09/17/2003, 08/26/2004, 09/04/2005, 09/02/2006, 09/08/2007, 08/03/2008, 09/02/2014, 09/02/2017, 10/10/2018, 10/01/2020   PFIZER(Purple Top)SARS-COV-2 Vaccination 01/04/2020, 01/25/2020, 10/01/2020   Pneumococcal Conjugate-13 04/27/2014, 04/06/2019   Pneumococcal Polysaccharide-23 08/16/2002, 11/15/2007   Td 01/10/2022   Tdap 07/13/2011, 06/18/2021    Past Surgical History:  Procedure Laterality Date   COLONOSCOPY WITH PROPOFOL N/A 09/10/2015   Procedure: COLONOSCOPY WITH PROPOFOL;  Surgeon: Midge Minium, MD;  Location: [facility] ENDOSCOPY;  Service: Endoscopy;  Laterality: N/A;   PROSTATE SURGERY  2008    Social History   Tobacco Use   Smoking status: Never   Smokeless tobacco: Never  Vaping Use   Vaping status: Never Used  Substance Use Topics   Alcohol use: No    Alcohol/week: 0.0 standard drinks of alcohol   Drug use: No    Family History  Problem Relation Age of Onset   Lung cancer Mother 3       deceased   Brain cancer Son 73       pt's only son   Polycythemia Brother        half brother   Prostate cancer Brother         06/04/2023    1:46 PM 03/05/2023    3:04 PM 01/16/2020   10:22 AM  GAD 7 : Generalized Anxiety Score  Nervous, Anxious, on Edge 1 1 0  Control/stop worrying 0 0 0  Worry too much - different things 0 0 0  Trouble relaxing 0 0 0  Restless 0 0 0  Easily annoyed or irritable 0 0 0  Afraid - awful might happen 0 0 0  Total GAD 7 Score 1 1 0  Anxiety Difficulty Not difficult at all Not difficult at all        06/04/2023    1:45 PM 03/05/2023    3:03 PM 01/16/2020   10:21 AM  Depression  screen PHQ 2/9  Decreased Interest 1 1 0  Down, Depressed, Hopeless 0 0 0  PHQ - 2 Score 1 1 0  Altered sleeping 0 0 0  Tired, decreased energy 3 3 0  Change in appetite 0 0 0  Feeling bad or failure about yourself  0 0 0  Trouble concentrating 0 0 0  Moving slowly or fidgety/restless 0 0 0  Suicidal thoughts 0 0 0  PHQ-9 Score 4 4 0  Difficult doing work/chores Not difficult at all Not difficult at all     BP Readings from Last 3 Encounters:  06/04/23 124/62  05/14/23 132/86  05/12/23 (!) 120/93    Wt Readings from Last 3 Encounters:  06/04/23 230 lb (104.3 kg)  05/14/23 213 lb (96.6 kg)  05/12/23 228 lb (103.4 kg)    BP 124/62   Pulse 88   Temp 98.4 F (36.9 C) (Oral)   Ht 5\' 9"  (1.753 m)   Wt 230 lb (104.3 kg)  SpO2 98%   BMI 33.97 kg/m   Physical Exam Vitals and nursing note reviewed.  Constitutional:      Appearance: Normal appearance.  Cardiovascular:     Rate and Rhythm: Normal rate and regular rhythm.     Heart sounds: Murmur heard.     Systolic murmur is present with a grade of 3/6.     No friction rub. No gallop.     Comments: Pulses 2+ at bilateral carotid, radial, pedal sites. No carotid bruit.  Trace pitting edema BLE Pulmonary:     Effort: Pulmonary effort is normal.     Breath sounds: Normal breath sounds. No wheezing or rales.  Abdominal:     General: There is no distension.  Musculoskeletal:        General: Normal range of motion.  Skin:    General: Skin is warm and dry.  Neurological:     Mental Status: He is alert and oriented to person, place, and time.     Gait: Gait is intact.  Psychiatric:        Mood and Affect: Mood and affect normal.     Recent Labs     Component Value Date/Time   NA 139 05/12/2023 1330   NA 137 09/14/2014 0834   K 4.3 05/12/2023 1330   K 4.0 09/14/2014 0834   CL 101 05/12/2023 1330   CL 101 09/14/2014 0834   CO2 28 05/12/2023 1330   CO2 27 09/14/2014 0834   GLUCOSE 134 (H) 05/12/2023 1330    GLUCOSE 123 (H) 09/14/2014 0834   BUN 23 05/12/2023 1330   BUN 14 09/14/2014 0834   CREATININE 1.21 05/12/2023 1330   CREATININE 1.14 02/05/2015 0949   CALCIUM 8.9 05/12/2023 1330   CALCIUM 8.8 09/14/2014 0834   PROT 6.6 05/12/2023 1330   PROT 6.7 09/14/2014 0834   ALBUMIN 4.3 05/12/2023 1330   ALBUMIN 3.6 09/14/2014 0834   AST 19 05/12/2023 1330   AST 21 09/14/2014 0834   ALT 12 05/12/2023 1330   ALT 26 09/14/2014 0834   ALKPHOS 42 05/12/2023 1330   ALKPHOS 49 09/14/2014 0834   BILITOT 1.0 05/12/2023 1330   BILITOT 1.1 (H) 09/14/2014 0834   GFRNONAA 57 (L) 05/12/2023 1330   GFRNONAA 60 (L) 02/05/2015 0949   GFRAA 43 (L) 04/25/2020 1339   GFRAA >60 02/05/2015 0949    Lab Results  Component Value Date   WBC 18.9 (H) 05/12/2023   HGB 15.2 05/12/2023   HCT 46.4 05/12/2023   MCV 90.6 05/12/2023   PLT 146 (L) 05/12/2023   Lab Results  Component Value Date   HGBA1C 5.0 11/21/2019   Lab Results  Component Value Date   CHOL 136 11/21/2019   HDL 32 (L) 11/21/2019   LDLCALC 87 11/21/2019   TRIG 84 11/21/2019   CHOLHDL 4.3 11/21/2019   Lab Results  Component Value Date   TSH 1.971 04/17/2021     Assessment and Plan:  1. Chronic atrial fibrillation (HCC) No a-fib today, seems to be well controlled at present. Continue current medications and specialist follow-up  2. Stage 3a chronic kidney disease (HCC) Labs reviewed, most recent GFR of 59 which is stable and actually improved from prior labs. Continue current management  3. CLL (chronic lymphocytic leukemia) (HCC) Labs reviewed, stable leukocytosis. Continue current medications and specialist follow-up  4. Atherosclerosis of aorta (HCC) Not felt to be candidate for intervention at this time. Focus will be on risk factor control.   5. LVH (  left ventricular hypertrophy) due to hypertensive disease, with heart failure (HCC) HTN well-controlled today. Recent stress echo ordered by cardiology. Continue current  medications and specialist follow-up  6. CHF (congestive heart failure), NYHA class II, chronic, diastolic (HCC) Has regular visits with heart failure clinic and I have reviewed these notes. Today he seems to be doing very well with no edema or SOB. Continue current medications and specialist follow-up   Return in about 3 months (around 09/04/2023) for OV f/u chronic conditions.   Estimated provider time of 35 minutes spent today including physical exam, chart review, review of recent labs from outside sources, review of multiple specialist notes, and review of multiple chronic conditions.   Alvester Morin, PA-C, DMSc, Nutritionist [facility] [phone]

## 2023-06-08 DIAGNOSIS — I5022 Chronic systolic (congestive) heart failure: Secondary | ICD-10-CM | POA: Diagnosis not present

## 2023-06-08 DIAGNOSIS — I482 Chronic atrial fibrillation, unspecified: Secondary | ICD-10-CM | POA: Diagnosis not present

## 2023-06-08 DIAGNOSIS — Z950 Presence of cardiac pacemaker: Secondary | ICD-10-CM | POA: Diagnosis not present

## 2023-06-08 DIAGNOSIS — E782 Mixed hyperlipidemia: Secondary | ICD-10-CM | POA: Diagnosis not present

## 2023-06-08 DIAGNOSIS — R0789 Other chest pain: Secondary | ICD-10-CM | POA: Diagnosis not present

## 2023-06-08 DIAGNOSIS — I10 Essential (primary) hypertension: Secondary | ICD-10-CM | POA: Diagnosis not present

## 2023-06-08 DIAGNOSIS — I7121 Aneurysm of the ascending aorta, without rupture: Secondary | ICD-10-CM | POA: Diagnosis not present

## 2023-06-08 DIAGNOSIS — I2510 Atherosclerotic heart disease of native coronary artery without angina pectoris: Secondary | ICD-10-CM | POA: Diagnosis not present

## 2023-06-29 DIAGNOSIS — H26493 Other secondary cataract, bilateral: Secondary | ICD-10-CM | POA: Diagnosis not present

## 2023-06-29 DIAGNOSIS — H353113 Nonexudative age-related macular degeneration, right eye, advanced atrophic without subfoveal involvement: Secondary | ICD-10-CM | POA: Diagnosis not present

## 2023-06-29 DIAGNOSIS — H43813 Vitreous degeneration, bilateral: Secondary | ICD-10-CM | POA: Diagnosis not present

## 2023-06-29 DIAGNOSIS — H353124 Nonexudative age-related macular degeneration, left eye, advanced atrophic with subfoveal involvement: Secondary | ICD-10-CM | POA: Diagnosis not present

## 2023-06-30 DIAGNOSIS — B351 Tinea unguium: Secondary | ICD-10-CM | POA: Diagnosis not present

## 2023-06-30 DIAGNOSIS — M79675 Pain in left toe(s): Secondary | ICD-10-CM | POA: Diagnosis not present

## 2023-06-30 DIAGNOSIS — M79674 Pain in right toe(s): Secondary | ICD-10-CM | POA: Diagnosis not present

## 2023-06-30 DIAGNOSIS — R600 Localized edema: Secondary | ICD-10-CM | POA: Diagnosis not present

## 2023-08-04 DIAGNOSIS — Z950 Presence of cardiac pacemaker: Secondary | ICD-10-CM | POA: Diagnosis not present

## 2023-08-04 DIAGNOSIS — I7121 Aneurysm of the ascending aorta, without rupture: Secondary | ICD-10-CM | POA: Diagnosis not present

## 2023-08-04 DIAGNOSIS — E782 Mixed hyperlipidemia: Secondary | ICD-10-CM | POA: Diagnosis not present

## 2023-08-04 DIAGNOSIS — I2510 Atherosclerotic heart disease of native coronary artery without angina pectoris: Secondary | ICD-10-CM | POA: Diagnosis not present

## 2023-08-04 DIAGNOSIS — I482 Chronic atrial fibrillation, unspecified: Secondary | ICD-10-CM | POA: Diagnosis not present

## 2023-08-04 DIAGNOSIS — I10 Essential (primary) hypertension: Secondary | ICD-10-CM | POA: Diagnosis not present

## 2023-08-04 DIAGNOSIS — I5022 Chronic systolic (congestive) heart failure: Secondary | ICD-10-CM | POA: Diagnosis not present

## 2023-08-11 NOTE — Progress Notes (Signed)
PCP: Tillie Fantasia, PA (last seen 08/24)  Primary Cardiologist: Fermin Schwab, PA (last seen 10/24)  HPI:  Brent Hoover is a 89 y/o male with a history of CLL, hyperlipidemia, HTN, mild AS, thoracic ascending aortic aneurysm, OSA, AF, gout, sleep apnea and chronic heart failure. 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   Admitted 02/09/23 due to worsening SOB over the last several months along with worsening pedal edema. IV diuresed and lost ~ 7 liters.  Echo 01/29/21: EF 45% with mild LVH & moderate valvular regurgitation.   Echo 02/10/23: EF 50-55% with severe LVH/ LAE    He presents today for a HF f/u visit with a complaint of moderate SOB with minimal exertion. Chronic in nature. Has associated chest "pressure" across chest first thing in the morning, moderate fatigue and dizziness with sudden position changes along with this. Denies chest pain, cough, palpitations, abdominal distention, pedal edema or difficulty sleeping.   He had tried jardiance in the past which resulted in tailbone, LBP but would like to try and feel better. Is considering getting an electric wheelchair because of his symptoms.   Is currently paying out of pocket for his medications because he no longer has medicare part D and is no longer going to the [place]. Would like some information regarding this.   ROS: All systems negative except as listed in HPI, PMH and Problem List.  SH:  Social History   Socioeconomic History   Marital status: Married    Spouse name: Not on file   Number of children: 2   Years of education: Not on file   Highest education level: Not on file  Occupational History   Not on file  Tobacco Use   Smoking status: Never   Smokeless tobacco: Never  Vaping Use   Vaping status: Never Used  Substance and Sexual Activity   Alcohol use: No    Alcohol/week: 0.0 standard drinks of alcohol   Drug use: No   Sexual activity: Not Currently  Other Topics  Concern   Not on file  Social History Narrative   Not on file   Social Determinants of Health   Financial Resource Strain: Low Risk  (03/05/2023)   Overall Financial Resource Strain (CARDIA)    Difficulty of Paying Living Expenses: Not hard at all  Food Insecurity: No Food Insecurity (03/05/2023)   Hunger Vital Sign    Worried About Running Out of Food in the Last Year: Never true    Ran Out of Food in the Last Year: Never true  Transportation Needs: No Transportation Needs (03/05/2023)   PRAPARE - Administrator, Civil Service (Medical): No    Lack of Transportation (Non-Medical): No  Physical Activity: Not on file  Stress: Not on file  Social Connections: Not on file  Intimate Partner Violence: Not At Risk (03/05/2023)   Humiliation, Afraid, Rape, and Kick questionnaire    Fear of Current or Ex-Partner: No    Emotionally Abused: No    Physically Abused: No    Sexually Abused: No    FH:  Family History  Problem Relation Age of Onset   Lung cancer Mother 33       deceased   Brain cancer Son 46       pt's only son   Polycythemia Brother        half brother   Prostate cancer Brother     Past Medical History:  Diagnosis Date  Arrhythmia    atrial fibrillation   Bladder neck obstruction    Cataracts, bilateral    CHF (congestive heart failure) (HCC)    Chronic lymphocytic leukemia (CLL), B-cell (HCC)    Lambda positive B-cell CLL   Gout    Hypercholesterolemia    Hypertension    Lymphocytosis    Sleep apnea    Thrombocytopenia (HCC)     Current Outpatient Medications  Medication Sig Dispense Refill   aspirin 81 MG EC tablet Take 1 tablet by mouth daily.     carvedilol (COREG) 6.25 MG tablet Take 6.25 mg by mouth every 12 (twelve) hours.     ezetimibe (ZETIA) 10 MG tablet Take 1 tablet by mouth daily.     furosemide (LASIX) 80 MG tablet Take 1 tablet (80 mg total) by mouth daily. 90 tablet 3   lisinopril (ZESTRIL) 20 MG tablet TAKE (1) TABLET BY MOUTH  DAILY. 30 tablet 0   Multiple Vitamins-Minerals (PRESERVISION AREDS 2 PO) Take by mouth.     nitroGLYCERIN (NITROSTAT) 0.4 MG SL tablet Place 1 tablet (0.4 mg total) under the tongue every 5 (five) minutes as needed for chest pain. 15 tablet 0   potassium chloride SA (KLOR-CON M) 20 MEQ tablet Take 1 tablet (20 mEq total) by mouth daily. 90 tablet 3   No current facility-administered medications for this visit.   Vitals:   08/17/23 1428 08/17/23 1430  BP: (!) 140/106 132/81  Pulse: 85   SpO2: 98%   Weight: 232 lb (105.2 kg)    Wt Readings from Last 3 Encounters:  08/17/23 232 lb (105.2 kg)  06/04/23 230 lb (104.3 kg)  05/14/23 213 lb (96.6 kg)   Lab Results  Component Value Date   CREATININE 1.21 05/12/2023   CREATININE 1.19 04/26/2023   CREATININE 1.40 (H) 04/15/2023   PHYSICAL EXAM:  General:  Well appearing. No resp difficulty HEENT: normal Neck: supple. JVP flat. No lymphadenopathy or thryomegaly appreciated. Cor: PMI normal. Regular rate & rhythm. No rubs, gallops or murmurs. Lungs: clear Abdomen: soft, nontender, nondistended. No hepatosplenomegaly. No bruits or masses.  Extremities: no cyanosis, clubbing, rash. trace pitting edema in bilateral lower legs with L>R Neuro: alert & oriented x3, cranial nerves grossly intact. Moves all 4 extremities w/o difficulty. Affect pleasant. Musculoskeletal: no tenderness to palpation of anterior chest wall  ECG: not done   ASSESSMENT & PLAN:  1: NICM with preserved ejection fraction- - likely due to HTN/ AF - NYHA class III - euvolemic today - weighing daily; reminded to call for an overnight weight gain of > 2 pounds or a weekly weight gain of > 5 pounds - Echo 01/29/21: EF 45% with mild LVH & moderate valvular regurgitation - Echo 02/10/23: EF 50-55% with severe LVH/ LAE.  - not adding salt to his food and is reading food labels and trying to eat more fresh fruits/ vegs; does get meals on wheels but he says that he sometimes  only eats 1/2 for lunch and the other 1/2 for supper - continue furosemide 80mg  daily  - continue potassium daily - continue carvedilol 6.25mg  BID - continue lisinopril 20mg  daily - begin farxiga 10mg  daily; 30 day voucher provided; if he develops tailbone/ LBP as he did with jardiance, he is to stop the farxiga - he will need assistance with this medication; have messaged social worker to reach out to patient about this - BMP next visit - discussed spironolactone as well - wearing compression socks most days unless  he's going to be at home and can sit and elevate his legs - does elevate his legs when sitting for long periods of time - BNP 04/15/23 was 254.3  2: HTN w/ CKD- - BP initially 140/106; repeat was 132/81 - saw PCP [name]) 08/24 - BMP 05/12/23 showed sodium 139, potassium 4.3, creatinine 1.21 & GFR 57 - repeat BMP next visit  3: Atrial fibrillation- - Medtronic PPM 10/2017 present due to bradycardia - saw cardiology ([name]) 10/24 - currently not on anticoag due to fall risk  4: Chronic ascending thoracic aortic aneurysm- - chest CT 02/09/23 showed 4.8cm aneurysm - previous surgical evaluation said due to age/ comorbidities it was unlikely to be successfully corrected  5: CLL- - previously treated with [name]  - saw oncology [name]) 07/24  6: OSA- - wearing CPAP nightly  - reports sleeping well for 5 hours and then wakes up frequently   Return in 2-3 weeks, sooner if needed.

## 2023-08-17 ENCOUNTER — Encounter: Payer: Self-pay | Admitting: Family

## 2023-08-17 ENCOUNTER — Ambulatory Visit: Payer: Medicare Other | Attending: Family | Admitting: Family

## 2023-08-17 VITALS — BP 132/81 | HR 85 | Wt 232.0 lb

## 2023-08-17 DIAGNOSIS — G4733 Obstructive sleep apnea (adult) (pediatric): Secondary | ICD-10-CM | POA: Diagnosis not present

## 2023-08-17 DIAGNOSIS — I5032 Chronic diastolic (congestive) heart failure: Secondary | ICD-10-CM | POA: Diagnosis not present

## 2023-08-17 DIAGNOSIS — I10 Essential (primary) hypertension: Secondary | ICD-10-CM | POA: Diagnosis not present

## 2023-08-17 DIAGNOSIS — E7800 Pure hypercholesterolemia, unspecified: Secondary | ICD-10-CM | POA: Insufficient documentation

## 2023-08-17 DIAGNOSIS — Z79899 Other long term (current) drug therapy: Secondary | ICD-10-CM | POA: Insufficient documentation

## 2023-08-17 DIAGNOSIS — C9110 Chronic lymphocytic leukemia of B-cell type not having achieved remission: Secondary | ICD-10-CM | POA: Diagnosis not present

## 2023-08-17 DIAGNOSIS — Z950 Presence of cardiac pacemaker: Secondary | ICD-10-CM | POA: Insufficient documentation

## 2023-08-17 DIAGNOSIS — I4891 Unspecified atrial fibrillation: Secondary | ICD-10-CM | POA: Insufficient documentation

## 2023-08-17 DIAGNOSIS — I482 Chronic atrial fibrillation, unspecified: Secondary | ICD-10-CM

## 2023-08-17 DIAGNOSIS — I130 Hypertensive heart and chronic kidney disease with heart failure and stage 1 through stage 4 chronic kidney disease, or unspecified chronic kidney disease: Secondary | ICD-10-CM | POA: Insufficient documentation

## 2023-08-17 DIAGNOSIS — N189 Chronic kidney disease, unspecified: Secondary | ICD-10-CM | POA: Diagnosis not present

## 2023-08-17 DIAGNOSIS — R0789 Other chest pain: Secondary | ICD-10-CM | POA: Insufficient documentation

## 2023-08-17 DIAGNOSIS — I7121 Aneurysm of the ascending aorta, without rupture: Secondary | ICD-10-CM | POA: Insufficient documentation

## 2023-08-17 DIAGNOSIS — I428 Other cardiomyopathies: Secondary | ICD-10-CM | POA: Insufficient documentation

## 2023-08-17 MED ORDER — DAPAGLIFLOZIN PROPANEDIOL 10 MG PO TABS: 10.0000 mg | ORAL_TABLET | Freq: Every day | ORAL | 5 refills | Status: DC

## 2023-09-06 ENCOUNTER — Encounter: Payer: Self-pay | Admitting: Physician Assistant

## 2023-09-06 ENCOUNTER — Ambulatory Visit (INDEPENDENT_AMBULATORY_CARE_PROVIDER_SITE_OTHER): Payer: Medicare Other | Admitting: Physician Assistant

## 2023-09-06 VITALS — BP 112/82 | Ht 69.0 in | Wt 233.0 lb

## 2023-09-06 DIAGNOSIS — N1831 Chronic kidney disease, stage 3a: Secondary | ICD-10-CM

## 2023-09-06 DIAGNOSIS — Z23 Encounter for immunization: Secondary | ICD-10-CM | POA: Diagnosis not present

## 2023-09-06 DIAGNOSIS — Z950 Presence of cardiac pacemaker: Secondary | ICD-10-CM

## 2023-09-06 DIAGNOSIS — I5032 Chronic diastolic (congestive) heart failure: Secondary | ICD-10-CM

## 2023-09-06 DIAGNOSIS — R682 Dry mouth, unspecified: Secondary | ICD-10-CM | POA: Diagnosis not present

## 2023-09-06 NOTE — Progress Notes (Signed)
Date:  09/06/2023   Name:  Brent Hoover   DOB:  [birth year]   MRN:  [id number]   Chief Complaint: Atrial Fibrillation, Chronic Kidney Disease, Congestive Heart Failure, and Dry Mouth (3 weeks, when sleeping, pt does have a CPAP, has been using CPAP for years)  HPI Brent Hoover returns for 21-month f/u on chronic conditions.  Recent trial of Farxiga attempted 08/17/2023, but patient could not afford due to lack of Medicare Part D. Says someone was supposed to call him to help with this but he never heard from anyone. Has not tried signing up online or by phone.  Primarily managed by specialists as below:  Oncology Dr. Cathie Hoover - last 05/12/23, next 05/11/24 Cardiology Brent Hudson PA-C - last 08/04/23, next 11/04/23 Heart Failure Clinic [facility] Brent Hoover - last 08/17/23, next 09/08/23 with BMP  Complains of dry mouth, no new medications or supplements, uses CPAP nightly but no recent changes to settings.   Patient asks if there is any way to shrink his medication list.  I reviewed with him that his medications are prescribed by specialists, and I do not see any that would be easily removed but encouraged him to follow-up with his other providers to see if they may be able to decrease his medication burden   Medication list has been reviewed and updated.  Current Meds  Medication Sig   aspirin 81 MG EC tablet Take 1 tablet by mouth daily.   carvedilol (COREG) 6.25 MG tablet Take 6.25 mg by mouth every 12 (twelve) hours.   ezetimibe (ZETIA) 10 MG tablet Take 1 tablet by mouth daily.   furosemide (LASIX) 80 MG tablet Take 1 tablet (80 mg total) by mouth daily.   isosorbide mononitrate (IMDUR) 30 MG 24 hr tablet Take by mouth.   lisinopril (ZESTRIL) 20 MG tablet TAKE (1) TABLET BY MOUTH DAILY.   Multiple Vitamins-Minerals (PRESERVISION AREDS 2 PO) Take by mouth.   nitroGLYCERIN (NITROSTAT) 0.4 MG SL tablet Place 1 tablet (0.4 mg total) under the tongue every 5 (five) minutes as needed for chest pain.    potassium chloride SA (KLOR-CON M) 20 MEQ tablet Take 1 tablet (20 mEq total) by mouth daily.   [DISCONTINUED] dapagliflozin propanediol (FARXIGA) 10 MG TABS tablet Take 1 tablet (10 mg total) by mouth daily before breakfast.     Review of Systems  Constitutional:  Positive for fatigue. Negative for fever.  Respiratory:  Positive for shortness of breath (DOE). Negative for chest tightness and wheezing.   Cardiovascular:  Negative for chest pain and palpitations.  Gastrointestinal:  Negative for abdominal pain.    Patient Active Problem List   Diagnosis Date Noted   Irregular heart rate 03/05/2023   Macular degeneration, left eye 03/05/2023   CHF (congestive heart failure), NYHA class III, chronic, diastolic (Brent Hoover) 02/10/2023   Hypocalcemia 05/11/2022   OSA (obstructive sleep apnea) 04/17/2021   Mild aortic stenosis 02/05/2021   Moderate tricuspid regurgitation 02/05/2021   Presence of permanent cardiac pacemaker 02/05/2021   History of colon polyps 01/29/2021   Gout 01/29/2021   Hypertrophy of prostate without urinary obstruction and other lower urinary tract symptoms (LUTS) 01/29/2021   Obesity 01/29/2021   Other heart block 01/29/2021   Shoulder joint pain 01/29/2021   Vitamin D deficiency 01/29/2021   Non-rheumatic mitral regurgitation 01/20/2021   LVH (left ventricular hypertrophy) due to hypertensive disease, with heart failure (Brent Hoover) 06/05/2020   Thoracic ascending aortic aneurysm 12/06/2019   Atherosclerosis of aorta (Brent Hoover) 04/06/2019  Chronic atrial fibrillation (Brent Hoover) 04/06/2019   HLD (hyperlipidemia) 03/31/2019   HTN (hypertension) 03/31/2019   CKD (chronic kidney disease), stage III (Brent Hoover) 03/31/2019   Hyponatremia 05/20/2018   Gastro-esophageal reflux disease without esophagitis 10/19/2017   Splenomegaly 07/27/2017   CLL (chronic lymphocytic leukemia) (Brent Hoover) 04/30/2015    Allergies  Allergen Reactions   Penicillins Other (See Comments)    Has patient had a PCN  reaction causing immediate rash, facial/tongue/throat swelling, SOB or lightheadedness with hypotension: Unknown Has patient had a PCN reaction causing severe rash involving mucus membranes or skin necrosis: Unknown Has patient had a PCN reaction that required hospitalization: Unknown Has patient had a PCN reaction occurring within the last 10 years: Unknown If all of the above answers are "NO", then may proceed with Cephalosporin use.   Atorvastatin Rash    Other reaction(s): Muscle pain Other reaction(s): Muscle pain   Jardiance [Empagliflozin] Other (See Comments)    LBP/ tailbone pain   Terazosin Rash    Other reaction(s): Feeling faint Other reaction(s): Feeling faint    Immunization History  Administered Date(s) Administered   Fluad Quad(high Dose 65+) 07/19/2019   Fluad Trivalent(High Dose 65+) 09/06/2023   Influenza Nasal 08/10/2017   Influenza, High Dose Seasonal PF 10/15/2015, 08/18/2016, 10/04/2018, 09/30/2020   Influenza, Seasonal, Injecte, Preservative Fre 12/31/2009, 08/21/2010, 07/13/2011, 08/22/2012, 10/06/2013   Influenza-Unspecified 08/02/2000, 08/11/2000, 08/02/2001, 08/16/2002, 09/17/2003, 08/26/2004, 09/04/2005, 09/02/2006, 09/08/2007, 08/03/2008, 09/02/2014, 09/02/2017, 10/10/2018, 09/30/2020   PFIZER(Purple Top)SARS-COV-2 Vaccination 01/04/2020, 01/25/2020, 10/01/2020   Pneumococcal Conjugate-13 04/27/2014, 04/06/2019   Pneumococcal Polysaccharide-23 08/16/2002, 11/15/2007   Td 01/10/2022   Tdap 07/13/2011, 06/18/2021    Past Surgical History:  Procedure Laterality Date   COLONOSCOPY WITH PROPOFOL N/A 09/10/2015   Procedure: COLONOSCOPY WITH PROPOFOL;  Surgeon: Midge Minium, MD;  Location: [facility] ENDOSCOPY;  Service: Endoscopy;  Laterality: N/A;   PROSTATE SURGERY  2008    Social History   Tobacco Use   Smoking status: Never   Smokeless tobacco: Never  Vaping Use   Vaping status: Never Used  Substance Use Topics   Alcohol use: No    Alcohol/week: 0.0  standard drinks of alcohol   Drug use: No    Family History  Problem Relation Age of Onset   Lung cancer Mother 86       deceased   Brain cancer Son 85       pt's only son   Polycythemia Brother        half brother   Prostate cancer Brother         09/06/2023    1:52 PM 06/04/2023    1:46 PM 03/05/2023    3:04 PM 01/16/2020   10:22 AM  GAD 7 : Generalized Anxiety Score  Nervous, Anxious, on Edge 0 1 1 0  Control/stop worrying 0 0 0 0  Worry too much - different things 0 0 0 0  Trouble relaxing 0 0 0 0  Restless 0 0 0 0  Easily annoyed or irritable 0 0 0 0  Afraid - awful might happen 0 0 0 0  Total GAD 7 Score 0 1 1 0  Anxiety Difficulty Somewhat difficult Not difficult at all Not difficult at all        09/06/2023    1:50 PM 06/04/2023    1:45 PM 03/05/2023    3:03 PM  Depression screen PHQ 2/9  Decreased Interest 1 1 1   Down, Depressed, Hopeless 0 0 0  PHQ - 2 Score 1 1 1  Altered sleeping 0 0 0  Tired, decreased energy 0 3 3  Change in appetite 0 0 0  Feeling bad or failure about yourself  0 0 0  Trouble concentrating 0 0 0  Moving slowly or fidgety/restless 0 0 0  Suicidal thoughts 0 0 0  PHQ-9 Score 1 4 4   Difficult doing work/chores Not difficult at all Not difficult at all Not difficult at all    BP Readings from Last 3 Encounters:  09/06/23 112/82  08/17/23 132/81  06/04/23 124/62    Wt Readings from Last 3 Encounters:  09/06/23 233 lb (105.7 kg)  08/17/23 232 lb (105.2 kg)  06/04/23 230 lb (104.3 kg)    BP 112/82   Ht 5\' 9"  (1.753 m)   Wt 233 lb (105.7 kg)   BMI 34.41 kg/m   Physical Exam Vitals and nursing note reviewed.  Constitutional:      Appearance: Normal appearance.  HENT:     Mouth/Throat:     Mouth: Mucous membranes are moist. No oral lesions.     Pharynx: Oropharynx is clear.  Neck:     Vascular: No carotid bruit.  Cardiovascular:     Rate and Rhythm: Normal rate and regular rhythm.     Heart sounds: No murmur heard.    No  friction rub. No gallop.     Comments: Trace BLE Pulmonary:     Effort: Pulmonary effort is normal.     Breath sounds: Normal breath sounds.  Abdominal:     General: There is no distension.  Musculoskeletal:        General: Normal range of motion.  Skin:    General: Skin is warm and dry.  Neurological:     Mental Status: He is alert and oriented to person, place, and time.     Gait: Gait is intact.  Psychiatric:        Mood and Affect: Mood and affect normal.     Recent Labs     Component Value Date/Time   NA 139 05/12/2023 1330   NA 137 09/14/2014 0834   K 4.3 05/12/2023 1330   K 4.0 09/14/2014 0834   CL 101 05/12/2023 1330   CL 101 09/14/2014 0834   CO2 28 05/12/2023 1330   CO2 27 09/14/2014 0834   GLUCOSE 134 (H) 05/12/2023 1330   GLUCOSE 123 (H) 09/14/2014 0834   BUN 23 05/12/2023 1330   BUN 14 09/14/2014 0834   CREATININE 1.21 05/12/2023 1330   CREATININE 1.14 02/05/2015 0949   CALCIUM 8.9 05/12/2023 1330   CALCIUM 8.8 09/14/2014 0834   PROT 6.6 05/12/2023 1330   PROT 6.7 09/14/2014 0834   ALBUMIN 4.3 05/12/2023 1330   ALBUMIN 3.6 09/14/2014 0834   AST 19 05/12/2023 1330   AST 21 09/14/2014 0834   ALT 12 05/12/2023 1330   ALT 26 09/14/2014 0834   ALKPHOS 42 05/12/2023 1330   ALKPHOS 49 09/14/2014 0834   BILITOT 1.0 05/12/2023 1330   BILITOT 1.1 (H) 09/14/2014 0834   GFRNONAA 57 (L) 05/12/2023 1330   GFRNONAA 60 (L) 02/05/2015 0949   GFRAA 43 (L) 04/25/2020 1339   GFRAA >60 02/05/2015 0949    Lab Results  Component Value Date   WBC 18.9 (H) 05/12/2023   HGB 15.2 05/12/2023   HCT 46.4 05/12/2023   MCV 90.6 05/12/2023   PLT 146 (L) 05/12/2023   Lab Results  Component Value Date   HGBA1C 5.0 11/21/2019   Lab Results  Component Value  Date   CHOL 136 11/21/2019   HDL 32 (L) 11/21/2019   LDLCALC 87 11/21/2019   TRIG 84 11/21/2019   CHOLHDL 4.3 11/21/2019   Lab Results  Component Value Date   TSH 1.971 04/17/2021     Assessment and  Plan:  1. CHF (congestive heart failure), NYHA class III, chronic, diastolic (Brent Hoover) Seems to be well-controlled at present.  Continue current regimen as prescribed by cardiology/heart failure clinic.  Follow-up with them as scheduled  2. Dry mouth Uncertain etiology, could be from CPAP.  Patient endorses adequate fluid intake and good urine color.  Advised he might consider trying TheraBreath dry mouth lozenges or similar.  3. Stage 3a chronic kidney disease (Brent Hoover) Stable, continue current regimen  4. Presence of permanent cardiac pacemaker RRR today, continue following with cardiology  5. Need for influenza vaccination Flu vaccine given today. - Flu Vaccine Trivalent High Dose (Fluad)   Return in about 6 months (around 03/05/2024) for OV f/u chronic conditions.    Alvester Morin, PA-C, DMSc, Nutritionist [facility] [phone]

## 2023-09-08 ENCOUNTER — Ambulatory Visit: Payer: Medicare Other | Attending: Family | Admitting: Family

## 2023-09-08 ENCOUNTER — Encounter: Payer: Self-pay | Admitting: Family

## 2023-09-08 VITALS — BP 128/72 | HR 72 | Ht 69.0 in | Wt 232.0 lb

## 2023-09-08 DIAGNOSIS — M109 Gout, unspecified: Secondary | ICD-10-CM | POA: Diagnosis not present

## 2023-09-08 DIAGNOSIS — I10 Essential (primary) hypertension: Secondary | ICD-10-CM | POA: Diagnosis not present

## 2023-09-08 DIAGNOSIS — I428 Other cardiomyopathies: Secondary | ICD-10-CM | POA: Diagnosis not present

## 2023-09-08 DIAGNOSIS — I130 Hypertensive heart and chronic kidney disease with heart failure and stage 1 through stage 4 chronic kidney disease, or unspecified chronic kidney disease: Secondary | ICD-10-CM | POA: Diagnosis not present

## 2023-09-08 DIAGNOSIS — C9110 Chronic lymphocytic leukemia of B-cell type not having achieved remission: Secondary | ICD-10-CM | POA: Insufficient documentation

## 2023-09-08 DIAGNOSIS — I5032 Chronic diastolic (congestive) heart failure: Secondary | ICD-10-CM

## 2023-09-08 DIAGNOSIS — I482 Chronic atrial fibrillation, unspecified: Secondary | ICD-10-CM

## 2023-09-08 DIAGNOSIS — N189 Chronic kidney disease, unspecified: Secondary | ICD-10-CM | POA: Diagnosis not present

## 2023-09-08 DIAGNOSIS — I7121 Aneurysm of the ascending aorta, without rupture: Secondary | ICD-10-CM | POA: Insufficient documentation

## 2023-09-08 DIAGNOSIS — Z79899 Other long term (current) drug therapy: Secondary | ICD-10-CM | POA: Insufficient documentation

## 2023-09-08 DIAGNOSIS — I4891 Unspecified atrial fibrillation: Secondary | ICD-10-CM | POA: Diagnosis not present

## 2023-09-08 DIAGNOSIS — G4733 Obstructive sleep apnea (adult) (pediatric): Secondary | ICD-10-CM | POA: Insufficient documentation

## 2023-09-08 DIAGNOSIS — I509 Heart failure, unspecified: Secondary | ICD-10-CM | POA: Diagnosis not present

## 2023-09-08 NOTE — Progress Notes (Signed)
PCP: Tillie Fantasia, PA (last seen 11/24)  Primary Cardiologist: Fermin Schwab, PA (last seen 10/24)  HPI:  Mr Batten is a 89 y/o male with a history of CLL, hyperlipidemia, HTN, mild AS, thoracic ascending aortic aneurysm, OSA, AF, gout, sleep apnea and chronic heart failure. 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   Admitted 02/09/23 due to worsening SOB over the last several months along with worsening pedal edema. IV diuresed and lost ~ 7 liters.  Echo 01/29/21: EF 45% with mild LVH & moderate valvular regurgitation.   Echo 02/10/23: EF 50-55% with severe LVH/ LAE    He presents today for a HF f/u visit with a complaint of moderate fatigue with minimal exertion. Chronic in nature and says this has been present ever since he was treated for leukemia about 6 years ago. Has some pedal edema along with this. Denies shortness of breath, chest pain, cough, palpitations, abdominal distention, dizziness or difficulty sleeping.    At last visit, farxiga 10mg  daily was ordered but his pharmacy wouldn't fill it for him and told him that it was covered by his insurance (even though he was given 30 day voucher for it). He would like to get back on medicare part D before trying to resume this.   ROS: All systems negative except as listed in HPI, PMH and Problem List.  SH:  Social History   Socioeconomic History   Marital status: Married    Spouse name: Not on file   Number of children: 2   Years of education: Not on file   Highest education level: Not on file  Occupational History   Not on file  Tobacco Use   Smoking status: Never   Smokeless tobacco: Never  Vaping Use   Vaping status: Never Used  Substance and Sexual Activity   Alcohol use: No    Alcohol/week: 0.0 standard drinks of alcohol   Drug use: No   Sexual activity: Not Currently  Other Topics Concern   Not on file  Social History Narrative   Not on file   Social Determinants of  Health   Financial Resource Strain: Low Risk  (03/05/2023)   Overall Financial Resource Strain (CARDIA)    Difficulty of Paying Living Expenses: Not hard at all  Food Insecurity: No Food Insecurity (03/05/2023)   Hunger Vital Sign    Worried About Running Out of Food in the Last Year: Never true    Ran Out of Food in the Last Year: Never true  Transportation Needs: No Transportation Needs (03/05/2023)   PRAPARE - Administrator, Civil Service (Medical): No    Lack of Transportation (Non-Medical): No  Physical Activity: Not on file  Stress: Not on file  Social Connections: Not on file  Intimate Partner Violence: Not At Risk (03/05/2023)   Humiliation, Afraid, Rape, and Kick questionnaire    Fear of Current or Ex-Partner: No    Emotionally Abused: No    Physically Abused: No    Sexually Abused: No    FH:  Family History  Problem Relation Age of Onset   Lung cancer Mother 10       deceased   Brain cancer Son 64       pt's only son   Polycythemia Brother        half brother   Prostate cancer Brother     Past Medical History:  Diagnosis Date   Arrhythmia    atrial fibrillation  Bladder neck obstruction    Cataracts, bilateral    CHF (congestive heart failure) (HCC)    Chronic lymphocytic leukemia (CLL), B-cell (HCC)    Lambda positive B-cell CLL   Gout    Hypercholesterolemia    Hypertension    Lymphocytosis    Sleep apnea    Thrombocytopenia (HCC)     Current Outpatient Medications  Medication Sig Dispense Refill   aspirin 81 MG EC tablet Take 1 tablet by mouth daily.     carvedilol (COREG) 6.25 MG tablet Take 6.25 mg by mouth every 12 (twelve) hours.     ezetimibe (ZETIA) 10 MG tablet Take 1 tablet by mouth daily.     isosorbide mononitrate (IMDUR) 30 MG 24 hr tablet Take by mouth.     lisinopril (ZESTRIL) 20 MG tablet TAKE (1) TABLET BY MOUTH DAILY. 30 tablet 0   Multiple Vitamins-Minerals (PRESERVISION AREDS 2 PO) Take by mouth.     nitroGLYCERIN  (NITROSTAT) 0.4 MG SL tablet Place 1 tablet (0.4 mg total) under the tongue every 5 (five) minutes as needed for chest pain. 15 tablet 0   potassium chloride SA (KLOR-CON M) 20 MEQ tablet Take 1 tablet (20 mEq total) by mouth daily. 90 tablet 3   furosemide (LASIX) 80 MG tablet Take 1 tablet (80 mg total) by mouth daily. 90 tablet 3   No current facility-administered medications for this visit.   Vitals:   09/08/23 1444  BP: 128/72  Pulse: 72  SpO2: 98%  Weight: 232 lb (105.2 kg)  Height: 5\' 9"  (1.753 m)   Wt Readings from Last 3 Encounters:  09/08/23 232 lb (105.2 kg)  09/06/23 233 lb (105.7 kg)  08/17/23 232 lb (105.2 kg)   Lab Results  Component Value Date   CREATININE 1.21 05/12/2023   CREATININE 1.19 04/26/2023   CREATININE 1.40 (H) 04/15/2023    PHYSICAL EXAM:  General:  Well appearing. No resp difficulty HEENT: normal Neck: supple. JVP flat. No lymphadenopathy or thryomegaly appreciated. Cor: PMI normal. Regular rate & rhythm. No rubs, gallops or murmurs. Lungs: clear Abdomen: soft, nontender, nondistended. No hepatosplenomegaly. No bruits or masses.  Extremities: no cyanosis, clubbing, rash. trace pitting edema in bilateral lower legs with L>R Neuro: alert & oriented x3, cranial nerves grossly intact. Moves all 4 extremities w/o difficulty. Affect pleasant. Musculoskeletal: no tenderness to palpation of anterior chest wall  ECG: not done   ASSESSMENT & PLAN:  1: NICM with preserved ejection fraction- - likely due to HTN/ AF - NYHA class III - euvolemic today - weighing daily; reminded to call for an overnight weight gain of > 2 pounds or a weekly weight gain of > 5 pounds - weight unchanged from last visit 1 month ago - Echo 01/29/21: EF 45% with mild LVH & moderate valvular regurgitation - Echo 02/10/23: EF 50-55% with severe LVH/ LAE.  - not adding salt to his food and is reading food labels and trying to eat more fresh fruits/ vegs; does get meals on wheels  but he says that he sometimes only eats 1/2 for lunch and the other 1/2 for supper - continue furosemide 80mg  daily  - continue potassium daily - continue carvedilol 6.25mg  BID - continue lisinopril 20mg  daily - was unable to get farxiga filled and he would like to see how to get back on medicare part D before trying to fill it again - will ask pharmD/ social worker for assistance with this - wearing compression socks most days unless  he's going to be at home and can sit and elevate his legs - does elevate his legs when sitting for long periods of time - BNP 04/15/23 was 254.3  2: HTN w/ CKD- - BP 128/72 - saw PCP [name]) 11/24 - BMP 05/12/23 showed sodium 139, potassium 4.3, creatinine 1.21 & GFR 57  3: Atrial fibrillation- - Medtronic PPM 10/2017 present due to bradycardia - saw cardiology ([name]) 10/24 - currently not on anticoag due to fall risk  4: Chronic ascending thoracic aortic aneurysm- - chest CT 02/09/23 showed 4.8cm aneurysm - previous surgical evaluation said due to age/ comorbidities it was unlikely to be successfully corrected  5: CLL- - previously treated with [name]  - saw oncology [name]) 07/24  6: OSA- - wearing CPAP nightly  - reports sleeping well for 5 hours and then wakes up frequently   Return in 3 months, sooner if needed.

## 2023-09-09 ENCOUNTER — Telehealth (HOSPITAL_COMMUNITY): Payer: Self-pay | Admitting: Licensed Clinical Social Worker

## 2023-09-09 NOTE — Telephone Encounter (Signed)
CSW contacted patient to discuss need for Medicare D. Patient states he is not sure what happened to his coverage but that he no longer has Medicare D. CSW suggested that patient contact [employer] to discuss further his options. Patient provided contact information for local [employer] and no further needs at this time. Lasandra Beech, LCSW, CCSW-MCS [phone]

## 2023-11-04 DIAGNOSIS — Z950 Presence of cardiac pacemaker: Secondary | ICD-10-CM | POA: Diagnosis not present

## 2023-11-04 DIAGNOSIS — I5022 Chronic systolic (congestive) heart failure: Secondary | ICD-10-CM | POA: Diagnosis not present

## 2023-11-04 DIAGNOSIS — I10 Essential (primary) hypertension: Secondary | ICD-10-CM | POA: Diagnosis not present

## 2023-11-04 DIAGNOSIS — I2510 Atherosclerotic heart disease of native coronary artery without angina pectoris: Secondary | ICD-10-CM | POA: Diagnosis not present

## 2023-11-04 DIAGNOSIS — E782 Mixed hyperlipidemia: Secondary | ICD-10-CM | POA: Diagnosis not present

## 2023-11-04 DIAGNOSIS — I482 Chronic atrial fibrillation, unspecified: Secondary | ICD-10-CM | POA: Diagnosis not present

## 2023-11-04 DIAGNOSIS — I7121 Aneurysm of the ascending aorta, without rupture: Secondary | ICD-10-CM | POA: Diagnosis not present

## 2023-11-30 DIAGNOSIS — H353124 Nonexudative age-related macular degeneration, left eye, advanced atrophic with subfoveal involvement: Secondary | ICD-10-CM | POA: Diagnosis not present

## 2023-11-30 DIAGNOSIS — H353113 Nonexudative age-related macular degeneration, right eye, advanced atrophic without subfoveal involvement: Secondary | ICD-10-CM | POA: Diagnosis not present

## 2023-11-30 DIAGNOSIS — H26493 Other secondary cataract, bilateral: Secondary | ICD-10-CM | POA: Diagnosis not present

## 2023-11-30 DIAGNOSIS — H43813 Vitreous degeneration, bilateral: Secondary | ICD-10-CM | POA: Diagnosis not present

## 2023-12-08 ENCOUNTER — Encounter: Payer: Medicare Other | Admitting: Family

## 2023-12-10 ENCOUNTER — Telehealth: Payer: Self-pay | Admitting: Family

## 2023-12-10 NOTE — Telephone Encounter (Signed)
 Pt confirmed appt on 12/13/23

## 2023-12-12 NOTE — Progress Notes (Deleted)
 Advanced Heart Failure Clinic Note   PCP: Brent Fantasia, PA (last seen 11/24)  Primary Cardiologist: Brent Schwab, PA (last seen 10/24)  Chief Complaint:  HPI:  Brent Hoover is a 89 y/o male with a history of CLL, hyperlipidemia, HTN, mild AS, thoracic ascending aortic aneurysm, OSA, AF, gout, sleep apnea and chronic heart failure. 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   Admitted 02/09/23 due to worsening SOB over the last several months along with worsening pedal edema. IV diuresed and lost ~ 7 liters.  Echo 01/29/21: EF 45% with mild LVH & moderate valvular regurgitation.   Echo 02/10/23: EF 50-55% with severe LVH/ LAE    He presents today for a HF f/u visit with a complaint of       ROS: All systems negative except as listed in HPI, PMH and Problem List.  SH:  Social History   Socioeconomic History   Marital status: Married    Spouse name: Not on file   Number of children: 2   Years of education: Not on file   Highest education level: Not on file  Occupational History   Not on file  Tobacco Use   Smoking status: Never   Smokeless tobacco: Never  Vaping Use   Vaping status: Never Used  Substance and Sexual Activity   Alcohol use: No    Alcohol/week: 0.0 standard drinks of alcohol   Drug use: No   Sexual activity: Not Currently  Other Topics Concern   Not on file  Social History Narrative   Not on file   Social Drivers of Health   Financial Resource Strain: Low Risk  (03/05/2023)   Overall Financial Resource Strain (CARDIA)    Difficulty of Paying Living Expenses: Not hard at all  Food Insecurity: No Food Insecurity (03/05/2023)   Hunger Vital Sign    Worried About Running Out of Food in the Last Year: Never true    Ran Out of Food in the Last Year: Never true  Transportation Needs: No Transportation Needs (03/05/2023)   PRAPARE - Administrator, Civil Service (Medical): No    Lack of Transportation  (Non-Medical): No  Physical Activity: Not on file  Stress: Not on file  Social Connections: Not on file  Intimate Partner Violence: Not At Risk (03/05/2023)   Humiliation, Afraid, Rape, and Kick questionnaire    Fear of Current or Ex-Partner: No    Emotionally Abused: No    Physically Abused: No    Sexually Abused: No    FH:  Family History  Problem Relation Age of Onset   Lung cancer Mother 88       deceased   Brain cancer Son 61       pt's only son   Polycythemia Brother        half brother   Prostate cancer Brother     Past Medical History:  Diagnosis Date   Arrhythmia    atrial fibrillation   Bladder neck obstruction    Cataracts, bilateral    CHF (congestive heart failure) (HCC)    Chronic lymphocytic leukemia (CLL), B-cell (HCC)    Lambda positive B-cell CLL   Gout    Hypercholesterolemia    Hypertension    Lymphocytosis    Sleep apnea    Thrombocytopenia (HCC)     Current Outpatient Medications  Medication Sig Dispense Refill   aspirin 81 MG EC tablet Take 1 tablet by mouth daily.  carvedilol (COREG) 6.25 MG tablet Take 6.25 mg by mouth every 12 (twelve) hours.     ezetimibe (ZETIA) 10 MG tablet Take 1 tablet by mouth daily.     furosemide (LASIX) 80 MG tablet Take 1 tablet (80 mg total) by mouth daily. 90 tablet 3   isosorbide mononitrate (IMDUR) 30 MG 24 hr tablet Take by mouth.     lisinopril (ZESTRIL) 20 MG tablet TAKE (1) TABLET BY MOUTH DAILY. 30 tablet 0   Multiple Vitamins-Minerals (PRESERVISION AREDS 2 PO) Take by mouth.     nitroGLYCERIN (NITROSTAT) 0.4 MG SL tablet Place 1 tablet (0.4 mg total) under the tongue every 5 (five) minutes as needed for chest pain. 15 tablet 0   potassium chloride SA (KLOR-CON M) 20 MEQ tablet Take 1 tablet (20 mEq total) by mouth daily. 90 tablet 3   No current facility-administered medications for this visit.      PHYSICAL EXAM:  General:  Well appearing. No resp difficulty HEENT: normal Neck: supple. JVP  flat. No lymphadenopathy or thryomegaly appreciated. Cor: PMI normal. Regular rate & rhythm. No rubs, gallops or murmurs. Lungs: clear Abdomen: soft, nontender, nondistended. No hepatosplenomegaly. No bruits or masses.  Extremities: no cyanosis, clubbing, rash. trace pitting edema in bilateral lower legs with L>R Neuro: alert & oriented x3, cranial nerves grossly intact. Moves all 4 extremities w/o difficulty. Affect pleasant. Musculoskeletal: no tenderness to palpation of anterior chest wall  ECG: not done   ASSESSMENT & PLAN:  1: NICM with preserved ejection fraction- - likely due to HTN/ AF - NYHA class III - euvolemic today - weighing daily; reminded to call for an overnight weight gain of > 2 pounds or a weekly weight gain of > 5 pounds - weight 232 from last visit 3 months ago - Echo 01/29/21: EF 45% with mild LVH & moderate valvular regurgitation - Echo 02/10/23: EF 50-55% with severe LVH/ LAE.  - not adding salt to his food and is reading food labels and trying to eat more fresh fruits/ vegs; does get meals on wheels but he says that he sometimes only eats 1/2 for lunch and the other 1/2 for supper - continue furosemide 80mg  daily  - continue potassium daily - continue carvedilol 6.25mg  BID - continue lisinopril 20mg  daily - was unable to get farxiga filled and he would like to see how to get back on medicare part D before trying to fill it again  - wearing compression socks most days unless he's going to be at home and can sit and elevate his legs - does elevate his legs when sitting for long periods of time - BNP 04/15/23 was 254.3  2: HTN w/ CKD- - BP  - saw PCP Brent Hoover) 11/24 - BMP 05/12/23 showed sodium 139, potassium 4.3, creatinine 1.21 & GFR 57  3: Atrial fibrillation- - Medtronic PPM 10/2017 present due to bradycardia - saw cardiology (Brent Hoover) 10/24 - currently not on anticoag due to fall risk  4: Chronic ascending thoracic aortic aneurysm- - chest CT  02/09/23 showed 4.8cm aneurysm - previous surgical evaluation said due to age/ comorbidities it was unlikely to be successfully corrected  5: CLL- - previously treated with [name]  - saw oncology Brent Hoover) 07/24  6: OSA- - wearing CPAP nightly  - reports sleeping well for 5 hours and then wakes up frequently   Return in 3 months, sooner if needed.      Delma Freeze, FNP 12/12/23

## 2023-12-13 ENCOUNTER — Encounter: Payer: Medicare Other | Admitting: Family

## 2024-01-03 ENCOUNTER — Telehealth: Payer: Self-pay | Admitting: Family

## 2024-01-03 NOTE — Telephone Encounter (Signed)
 Pt family member canceled appt on 12/13/23, states they will call back to reschedule at a later date

## 2024-01-17 ENCOUNTER — Encounter: Payer: Self-pay | Admitting: Physician Assistant

## 2024-01-17 ENCOUNTER — Ambulatory Visit (INDEPENDENT_AMBULATORY_CARE_PROVIDER_SITE_OTHER): Admitting: Physician Assistant

## 2024-01-17 VITALS — BP 136/64 | HR 81 | Temp 97.4°F | Ht 69.0 in | Wt 227.0 lb

## 2024-01-17 DIAGNOSIS — L729 Follicular cyst of the skin and subcutaneous tissue, unspecified: Secondary | ICD-10-CM

## 2024-01-17 NOTE — Progress Notes (Signed)
 Date:  01/17/2024   Name:  Brent Hoover   DOB:  [birth year]   MRN:  [id number]   Chief Complaint: Mass (Started of small and got bigger, no drainage, painful, 2-3 pain scale, hasn't seen it )  HPI Brent Hoover presents today for evaluation of a "cyst near my tailbone" which has been present for several years and is bothersome because it is somewhat painful when he sits. Previously evaluated for this problem by [facility] "a couple years ago", says they "injected something into it" but these notes are not available for my review.    Medication list has been reviewed and updated.  Current Meds  Medication Sig   aspirin 81 MG EC tablet Take 1 tablet by mouth daily.   carvedilol (COREG) 6.25 MG tablet Take 6.25 mg by mouth every 12 (twelve) hours.   ezetimibe (ZETIA) 10 MG tablet Take 1 tablet by mouth daily.   furosemide (LASIX) 80 MG tablet Take 1 tablet (80 mg total) by mouth daily.   lisinopril (ZESTRIL) 20 MG tablet TAKE (1) TABLET BY MOUTH DAILY.   Multiple Vitamins-Minerals (PRESERVISION AREDS 2 PO) Take by mouth.   nitroGLYCERIN (NITROSTAT) 0.4 MG SL tablet Place 1 tablet (0.4 mg total) under the tongue every 5 (five) minutes as needed for chest pain.   potassium chloride SA (KLOR-CON M) 20 MEQ tablet Take 1 tablet (20 mEq total) by mouth daily.     Review of Systems  Patient Active Problem List   Diagnosis Date Noted   Irregular heart rate 03/05/2023   Macular degeneration, left eye 03/05/2023   CHF (congestive heart failure), NYHA class III, chronic, diastolic (HCC) 02/10/2023   Hypocalcemia 05/11/2022   OSA (obstructive sleep apnea) 04/17/2021   Mild aortic stenosis 02/05/2021   Moderate tricuspid regurgitation 02/05/2021   Presence of permanent cardiac pacemaker 02/05/2021   History of colon polyps 01/29/2021   Gout 01/29/2021   Hypertrophy of prostate without urinary obstruction and other lower urinary tract symptoms (LUTS) 01/29/2021   Obesity 01/29/2021    Other heart block 01/29/2021   Shoulder joint pain 01/29/2021   Vitamin D deficiency 01/29/2021   Non-rheumatic mitral regurgitation 01/20/2021   LVH (left ventricular hypertrophy) due to hypertensive disease, with heart failure (HCC) 06/05/2020   Thoracic ascending aortic aneurysm 12/06/2019   Atherosclerosis of aorta (HCC) 04/06/2019   Chronic atrial fibrillation (HCC) 04/06/2019   HLD (hyperlipidemia) 03/31/2019   HTN (hypertension) 03/31/2019   CKD (chronic kidney disease), stage III (HCC) 03/31/2019   Hyponatremia 05/20/2018   Gastro-esophageal reflux disease without esophagitis 10/19/2017   Splenomegaly 07/27/2017   CLL (chronic lymphocytic leukemia) (HCC) 04/30/2015    Allergies  Allergen Reactions   Penicillins Other (See Comments)    Has patient had a PCN reaction causing immediate rash, facial/tongue/throat swelling, SOB or lightheadedness with hypotension: Unknown Has patient had a PCN reaction causing severe rash involving mucus membranes or skin necrosis: Unknown Has patient had a PCN reaction that required hospitalization: Unknown Has patient had a PCN reaction occurring within the last 10 years: Unknown If all of the above answers are "NO", then may proceed with Cephalosporin use.   Atorvastatin Rash    Other reaction(s): Muscle pain Other reaction(s): Muscle pain   Jardiance [Empagliflozin] Other (See Comments)    LBP/ tailbone pain   Terazosin Rash    Other reaction(s): Feeling faint Other reaction(s): Feeling faint    Immunization History  Administered Date(s) Administered   Fluad Quad(high Dose 65+) 07/19/2019  Fluad Trivalent(High Dose 65+) 09/06/2023   Influenza Nasal 08/10/2017   Influenza, High Dose Seasonal PF 10/15/2015, 08/18/2016, 10/04/2018, 09/30/2020   Influenza, Seasonal, Injecte, Preservative Fre 12/31/2009, 08/21/2010, 07/13/2011, 08/22/2012, 10/06/2013   Influenza-Unspecified 08/02/2000, 08/11/2000, 08/02/2001, 08/16/2002, 09/17/2003,  08/26/2004, 09/04/2005, 09/02/2006, 09/08/2007, 08/03/2008, 09/02/2014, 09/02/2017, 10/10/2018, 09/30/2020   PFIZER(Purple Top)SARS-COV-2 Vaccination 01/04/2020, 01/25/2020, 10/01/2020   Pneumococcal Conjugate-13 04/27/2014, 04/06/2019   Pneumococcal Polysaccharide-23 08/16/2002, 11/15/2007   Td 01/10/2022   Tdap 07/13/2011, 06/18/2021    Past Surgical History:  Procedure Laterality Date   COLONOSCOPY WITH PROPOFOL N/A 09/10/2015   Procedure: COLONOSCOPY WITH PROPOFOL;  Surgeon: Midge Minium, MD;  Location: [facility] ENDOSCOPY;  Service: Endoscopy;  Laterality: N/A;   PROSTATE SURGERY  2008    Social History   Tobacco Use   Smoking status: Never   Smokeless tobacco: Never  Vaping Use   Vaping status: Never Used  Substance Use Topics   Alcohol use: No    Alcohol/week: 0.0 standard drinks of alcohol   Drug use: No    Family History  Problem Relation Age of Onset   Lung cancer Mother 23       deceased   Brain cancer Son 31       pt's only son   Polycythemia Brother        half brother   Prostate cancer Brother         01/17/2024    4:04 PM 09/06/2023    1:52 PM 06/04/2023    1:46 PM 03/05/2023    3:04 PM  GAD 7 : Generalized Anxiety Score  Nervous, Anxious, on Edge 0 0 1 1  Control/stop worrying 0 0 0 0  Worry too much - different things 0 0 0 0  Trouble relaxing 0 0 0 0  Restless 0 0 0 0  Easily annoyed or irritable 0 0 0 0  Afraid - awful might happen 0 0 0 0  Total GAD 7 Score 0 0 1 1  Anxiety Difficulty Not difficult at all Somewhat difficult Not difficult at all Not difficult at all       01/17/2024    4:04 PM 09/06/2023    1:50 PM 06/04/2023    1:45 PM  Depression screen PHQ 2/9  Decreased Interest 0 1 1  Down, Depressed, Hopeless 0 0 0  PHQ - 2 Score 0 1 1  Altered sleeping  0 0  Tired, decreased energy  0 3  Change in appetite  0 0  Feeling bad or failure about yourself   0 0  Trouble concentrating  0 0  Moving slowly or fidgety/restless  0 0  Suicidal  thoughts  0 0  PHQ-9 Score  1 4  Difficult doing work/chores  Not difficult at all Not difficult at all    BP Readings from Last 3 Encounters:  01/17/24 136/64  09/08/23 128/72  09/06/23 112/82    Wt Readings from Last 3 Encounters:  01/17/24 227 lb (103 kg)  09/08/23 232 lb (105.2 kg)  09/06/23 233 lb (105.7 kg)    BP 136/64   Pulse 81   Temp (!) 97.4 F (36.3 C)   Ht 5\' 9"  (1.753 m)   Wt 227 lb (103 kg)   SpO2 95%   BMI 33.52 kg/m   Physical Exam Vitals and nursing note reviewed.  Constitutional:      Appearance: Normal appearance.  Cardiovascular:     Rate and Rhythm: Normal rate.  Pulmonary:     Effort: Pulmonary  effort is normal.  Abdominal:     General: There is no distension.  Musculoskeletal:        General: Normal range of motion.  Skin:    General: Skin is warm and dry.     Comments: On the right buttock there is a 1 cm scaling nodule with surrounding erythema, mildly tender, does not have purulent drainage.   Neurological:     Mental Status: He is alert and oriented to person, place, and time.     Gait: Gait is intact.  Psychiatric:        Mood and Affect: Mood and affect normal.         Recent Labs     Component Value Date/Time   NA 139 05/12/2023 1330   NA 137 09/14/2014 0834   K 4.3 05/12/2023 1330   K 4.0 09/14/2014 0834   CL 101 05/12/2023 1330   CL 101 09/14/2014 0834   CO2 28 05/12/2023 1330   CO2 27 09/14/2014 0834   GLUCOSE 134 (H) 05/12/2023 1330   GLUCOSE 123 (H) 09/14/2014 0834   BUN 23 05/12/2023 1330   BUN 14 09/14/2014 0834   CREATININE 1.21 05/12/2023 1330   CREATININE 1.14 02/05/2015 0949   CALCIUM 8.9 05/12/2023 1330   CALCIUM 8.8 09/14/2014 0834   PROT 6.6 05/12/2023 1330   PROT 6.7 09/14/2014 0834   ALBUMIN 4.3 05/12/2023 1330   ALBUMIN 3.6 09/14/2014 0834   AST 19 05/12/2023 1330   AST 21 09/14/2014 0834   ALT 12 05/12/2023 1330   ALT 26 09/14/2014 0834   ALKPHOS 42 05/12/2023 1330   ALKPHOS 49  09/14/2014 0834   BILITOT 1.0 05/12/2023 1330   BILITOT 1.1 (H) 09/14/2014 0834   GFRNONAA 57 (L) 05/12/2023 1330   GFRNONAA 60 (L) 02/05/2015 0949   GFRAA 43 (L) 04/25/2020 1339   GFRAA >60 02/05/2015 0949    Lab Results  Component Value Date   WBC 18.9 (H) 05/12/2023   HGB 15.2 05/12/2023   HCT 46.4 05/12/2023   MCV 90.6 05/12/2023   PLT 146 (L) 05/12/2023   Lab Results  Component Value Date   HGBA1C 5.0 11/21/2019   Lab Results  Component Value Date   CHOL 136 11/21/2019   HDL 32 (L) 11/21/2019   LDLCALC 87 11/21/2019   TRIG 84 11/21/2019   CHOLHDL 4.3 11/21/2019   Lab Results  Component Value Date   TSH 1.971 04/17/2021     Assessment and Plan:  1. Cyst of buttock (Primary) Suspect epidermoid cyst or similar. Based on appearance, may need to be biopsied by derm.  Advised warm compress when bathing and antibiotic ointment at bedtime every night for two weeks. If not improving, advised to make appt with dermatology. He is to reach out to our office if a referral is needed.    F/u PRN  Alvester Morin, PA-C, DMSc, Nutritionist [facility] [phone]

## 2024-01-19 ENCOUNTER — Ambulatory Visit: Attending: Family | Admitting: Family

## 2024-01-19 ENCOUNTER — Encounter: Payer: Self-pay | Admitting: Family

## 2024-01-19 ENCOUNTER — Other Ambulatory Visit (HOSPITAL_COMMUNITY): Payer: Self-pay

## 2024-01-19 ENCOUNTER — Telehealth (HOSPITAL_COMMUNITY): Payer: Self-pay | Admitting: Licensed Clinical Social Worker

## 2024-01-19 VITALS — BP 121/68 | HR 65 | Ht 69.0 in | Wt 229.0 lb

## 2024-01-19 DIAGNOSIS — C9110 Chronic lymphocytic leukemia of B-cell type not having achieved remission: Secondary | ICD-10-CM

## 2024-01-19 DIAGNOSIS — G4733 Obstructive sleep apnea (adult) (pediatric): Secondary | ICD-10-CM | POA: Diagnosis not present

## 2024-01-19 DIAGNOSIS — I5030 Unspecified diastolic (congestive) heart failure: Secondary | ICD-10-CM | POA: Insufficient documentation

## 2024-01-19 DIAGNOSIS — I428 Other cardiomyopathies: Secondary | ICD-10-CM | POA: Diagnosis not present

## 2024-01-19 DIAGNOSIS — N189 Chronic kidney disease, unspecified: Secondary | ICD-10-CM | POA: Diagnosis not present

## 2024-01-19 DIAGNOSIS — Z856 Personal history of leukemia: Secondary | ICD-10-CM | POA: Diagnosis not present

## 2024-01-19 DIAGNOSIS — I7121 Aneurysm of the ascending aorta, without rupture: Secondary | ICD-10-CM | POA: Diagnosis not present

## 2024-01-19 DIAGNOSIS — I482 Chronic atrial fibrillation, unspecified: Secondary | ICD-10-CM | POA: Diagnosis not present

## 2024-01-19 DIAGNOSIS — I4891 Unspecified atrial fibrillation: Secondary | ICD-10-CM | POA: Insufficient documentation

## 2024-01-19 DIAGNOSIS — M109 Gout, unspecified: Secondary | ICD-10-CM | POA: Diagnosis not present

## 2024-01-19 DIAGNOSIS — I10 Essential (primary) hypertension: Secondary | ICD-10-CM | POA: Diagnosis not present

## 2024-01-19 DIAGNOSIS — I130 Hypertensive heart and chronic kidney disease with heart failure and stage 1 through stage 4 chronic kidney disease, or unspecified chronic kidney disease: Secondary | ICD-10-CM | POA: Diagnosis not present

## 2024-01-19 DIAGNOSIS — I5032 Chronic diastolic (congestive) heart failure: Secondary | ICD-10-CM

## 2024-01-19 NOTE — Progress Notes (Signed)
 [facility] - HEART FAILURE CLINIC - PHARMACIST COUNSELING NOTE  Guideline-Directed Medical Therapy/Evidence Based Medicine  ACE/ARB/ARNI: Lisinopril 20 mg daily Beta Blocker: Carvedilol 6.25 mg twice daily Aldosterone Antagonist:  N/A Diuretic: Furosemide 80 mg daily SGLT2i:  N/A  Adherence Assessment  Do you ever forget to take your medication? [x] Yes [] No  Do you ever skip doses due to side effects?  [] Yes [] No  Do you have trouble affording your medicines? Medicare D  [] Yes [] No  Are you ever unable to pick up your medication due to transportation difficulties? [] Yes [] No  Do you ever stop taking your medications because you don't believe they are helping? [] Yes [] No  Do you check your weight daily? Every day  BP  [] Yes [] No   Adherence strategy: Pill box   Barriers to obtaining medications: ***  Vital signs: HR ***, BP ***, weight (pounds) *** ECHO: Date ***, EF ***, notes *** Cath: Date ***, EF ***, notes ***     Latest Ref Rng & Units 05/12/2023    1:30 PM 04/26/2023    3:09 PM 04/15/2023    1:30 PM  BMP  Glucose 70 - 99 mg/dL 295  188  99   BUN 8 - 23 mg/dL 23  24  31    Creatinine 0.61 - 1.24 mg/dL 4.16  6.06  3.01   Sodium 135 - 145 mmol/L 139  140  136   Potassium 3.5 - 5.1 mmol/L 4.3  4.1  3.8   Chloride 98 - 111 mmol/L 101  106  101   CO2 22 - 32 mmol/L 28  25  25    Calcium 8.9 - 10.3 mg/dL 8.9  9.1  8.6    Past Medical History:  Diagnosis Date   Arrhythmia    atrial fibrillation   Bladder neck obstruction    Cataracts, bilateral    CHF (congestive heart failure) (HCC)    Chronic lymphocytic leukemia (CLL), B-cell (HCC)    Lambda positive B-cell CLL   Gout    Hypercholesterolemia    Hypertension    Lymphocytosis    Sleep apnea    Thrombocytopenia (HCC)    ASSESSMENT 89 year old male who presents to the HF clinic ***  Recent ED Visit (past 6 months): Date - ***, CC - ***  COUNSELING POINTS/CLINICAL  PEARLS   PLAN    Current Outpatient Medications:    aspirin 81 MG EC tablet, Take 1 tablet by mouth daily., Disp: , Rfl:    carvedilol (COREG) 6.25 MG tablet, Take 6.25 mg by mouth every 12 (twelve) hours., Disp: , Rfl:    ezetimibe (ZETIA) 10 MG tablet, Take 1 tablet by mouth daily., Disp: , Rfl:    furosemide (LASIX) 80 MG tablet, Take 1 tablet (80 mg total) by mouth daily., Disp: 90 tablet, Rfl: 3   isosorbide mononitrate (IMDUR) 30 MG 24 hr tablet, Take by mouth., Disp: , Rfl:    lisinopril (ZESTRIL) 20 MG tablet, TAKE (1) TABLET BY MOUTH DAILY., Disp: 30 tablet, Rfl: 0   Multiple Vitamins-Minerals (PRESERVISION AREDS 2 PO), Take by mouth., Disp: , Rfl:    nitroGLYCERIN (NITROSTAT) 0.4 MG SL tablet, Place 1 tablet (0.4 mg total) under the tongue every 5 (five) minutes as needed for chest pain., Disp: 15 tablet, Rfl: 0   potassium chloride SA (KLOR-CON M) 20 MEQ tablet, Take 1 tablet (20 mEq total) by mouth daily., Disp: 90 tablet, Rfl: 3  Time spent: 20 minutes  Littie Deeds, PharmD Pharmacy  Resident  01/19/2024 10:22 AM

## 2024-01-19 NOTE — Progress Notes (Signed)
 [facility] Note    PCP: Brent Lipps, PA (last seen 03/25) Cardiologist: Brent Dieter, DO (last seen 01/25)  Chief Complaint: fatigue  HPI:  Brent Hoover is a 89 y/o male with a history of CLL, hyperlipidemia, HTN, mild AS, thoracic ascending aortic aneurysm, OSA, AF, gout, sleep apnea and chronic heart failure. Has a Medtronic PPM model AZURE [id number] due to bradycardia implanted 10/2017.Marland Kitchen   Echo 01/29/21: EF 45% with mild LVH & moderate valvular regurgitation  Admitted 02/09/23 due to worsening SOB over the last several months along with worsening pedal edema. IV diuresed and lost ~ 7 liters. Echo 02/10/23: EF 50-55% with severe LVH/ LAE   He presents today for a HF f/u visit with a complaint of moderate fatigue. "I'm tired all the time". Has associated shortness of breath, dizziness with sudden position changes  Sleeping well on 1 pillow. Denies chest pain, cough, palpitations, abdominal distention or pedal edema.   Voices a desire to get signed back up with medicare part D. He says that he used to have it, then started going to the [place] and "someone cancelled it". He no longer goes to the [place] and doesn't tend to return so now wants his PartD back to help pay for medications. Is not interested in taking anything else until this is cleared up. Says that he talked to someone before and was told that he would have to "pay $1000's to get it back". He feels like everyone wants to push pills on him.   ROS: All systems negative except what is listed in HPI, PMH and Problem List   Past Medical History:  Diagnosis Date   Arrhythmia    atrial fibrillation   Bladder neck obstruction    Cataracts, bilateral    CHF (congestive heart failure) (HCC)    Chronic lymphocytic leukemia (CLL), B-cell (HCC)    Lambda positive B-cell CLL   Gout    Hypercholesterolemia    Hypertension    Lymphocytosis    Sleep apnea    Thrombocytopenia (HCC)     Current Outpatient  Medications  Medication Sig Dispense Refill   aspirin 81 MG EC tablet Take 1 tablet by mouth daily.     carvedilol (COREG) 6.25 MG tablet Take 6.25 mg by mouth every 12 (twelve) hours.     ezetimibe (ZETIA) 10 MG tablet Take 1 tablet by mouth daily.     furosemide (LASIX) 80 MG tablet Take 1 tablet (80 mg total) by mouth daily. 90 tablet 3   isosorbide mononitrate (IMDUR) 30 MG 24 hr tablet Take by mouth.     lisinopril (ZESTRIL) 20 MG tablet TAKE (1) TABLET BY MOUTH DAILY. 30 tablet 0   Multiple Vitamins-Minerals (PRESERVISION AREDS 2 PO) Take by mouth.     nitroGLYCERIN (NITROSTAT) 0.4 MG SL tablet Place 1 tablet (0.4 mg total) under the tongue every 5 (five) minutes as needed for chest pain. 15 tablet 0   potassium chloride SA (KLOR-CON M) 20 MEQ tablet Take 1 tablet (20 mEq total) by mouth daily. 90 tablet 3   No current facility-administered medications for this visit.    Allergies  Allergen Reactions   Penicillins Other (See Comments)    Has patient had a PCN reaction causing immediate rash, facial/tongue/throat swelling, SOB or lightheadedness with hypotension: Unknown Has patient had a PCN reaction causing severe rash involving mucus membranes or skin necrosis: Unknown Has patient had a PCN reaction that required hospitalization: Unknown Has patient had a PCN  reaction occurring within the last 10 years: Unknown If all of the above answers are "NO", then may proceed with Cephalosporin use.   Atorvastatin Rash    Other reaction(s): Muscle pain Other reaction(s): Muscle pain   Jardiance [Empagliflozin] Other (See Comments)    LBP/ tailbone pain   Terazosin Rash    Other reaction(s): Feeling faint Other reaction(s): Feeling faint      Social History   Socioeconomic History   Marital status: Married    Spouse name: Not on file   Number of children: 2   Years of education: Not on file   Highest education level: Not on file  Occupational History   Not on file  Tobacco Use    Smoking status: Never   Smokeless tobacco: Never  Vaping Use   Vaping status: Never Used  Substance and Sexual Activity   Alcohol use: No    Alcohol/week: 0.0 standard drinks of alcohol   Drug use: No   Sexual activity: Not Currently  Other Topics Concern   Not on file  Social History Narrative   Not on file   Social Drivers of Health   Financial Resource Strain: Low Risk  (03/05/2023)   Overall Financial Resource Strain (CARDIA)    Difficulty of Paying Living Expenses: Not hard at all  Food Insecurity: No Food Insecurity (01/17/2024)   Hunger Vital Sign    Worried About Running Out of Food in the Last Year: Never true    Ran Out of Food in the Last Year: Never true  Transportation Needs: No Transportation Needs (01/17/2024)   PRAPARE - Administrator, Civil Service (Medical): No    Lack of Transportation (Non-Medical): No  Physical Activity: Not on file  Stress: Not on file  Social Connections: Not on file  Intimate Partner Violence: Not At Risk (01/17/2024)   Humiliation, Afraid, Rape, and Kick questionnaire    Fear of Current or Ex-Partner: No    Emotionally Abused: No    Physically Abused: No    Sexually Abused: No      Family History  Problem Relation Age of Onset   Lung cancer Mother 70       deceased   Brain cancer Son 69       pt's only son   Polycythemia Brother        half brother   Prostate cancer Brother     Wt Readings from Last 3 Encounters:  01/17/24 227 lb (103 kg)  09/08/23 232 lb (105.2 kg)  09/06/23 233 lb (105.7 kg)   Lab Results  Component Value Date   CREATININE 1.21 05/12/2023   CREATININE 1.19 04/26/2023   CREATININE 1.40 (H) 04/15/2023    PHYSICAL EXAM:  General: Well appearing. No resp difficulty HEENT: normal Neck: supple, no JVD Cor: Regular rhythm, rate. No rubs, gallops or murmurs Lungs: clear Abdomen: soft, nontender, nondistended. Extremities: no cyanosis, clubbing, rash, trace pitting edema bilateral lower  legs Neuro: alert & oriented X 3. Moves all 4 extremities w/o difficulty. Affect pleasant   ECG: not done   ASSESSMENT & PLAN:  1: NICM with preserved ejection fraction- - likely due to HTN/ AF - NYHA class III - euvolemic today - weighing daily; reminded to call for an overnight weight gain of > 2 pounds or a weekly weight gain of > 5 pounds - weight down 5 pounds from last visit 4 months ago - Echo 01/29/21: EF 45% with mild LVH & moderate valvular regurgitation -  Echo 02/10/23: EF 50-55% with severe LVH/ LAE.  - not adding salt to his food  - continue carvedilol 6.25mg  BID; consider titrating this down as generally not needed in HFpEF - continue furosemide 80mg  daily / continue potassium daily - continue lisinopril 20mg  daily - saw cardiology (Brent Hoover) 01/25 - wearing compression socks most days unless he's going to be at home and can sit and elevate his legs - does elevate his legs when sitting for long periods of time - BNP 04/15/23 was 254.3  2: HTN w/ CKD- - saw PCP Brent Hoover) 03/25 - BMP 05/12/23 showed sodium 139, potassium 4.3, creatinine 1.21 & GFR 57  3: Atrial fibrillation- - Medtronic PPM 10/2017 present due to bradycardia - currently not on anticoag due to fall risk  4: Chronic ascending thoracic aortic aneurysm- - chest CT 02/09/23 showed 4.8cm aneurysm - previous surgical evaluation said due to age/ comorbidities it was unlikely to be successfully corrected  5: CLL- - previously treated with Brent Hoover  - saw oncology Brent Hoover) 07/24  6: OSA- - wearing CPAP nightly  - reports sleeping well for 5 hours and then wakes up frequently    Local resource provided for medicare part D assistance. Will also ask social worker to reach out to patient. He was appreciative of this.   Due to HF stability, will not make a return appointment at this time. Advised to follow closely with cardiology but that he could return at anytime for questions or to make another appointment  and he was comfortable with this plan.   Brent Freeze, FNP 01/19/24

## 2024-01-19 NOTE — Telephone Encounter (Signed)
 CSW attempted to call pt to discuss Medicare part D- unable to reach- left VM requesting return call  Burna Sis, LCSW Clinical Social Worker [facility] Desk#: [phone] Cell#: [phone]

## 2024-01-19 NOTE — Patient Instructions (Signed)
 Please call us if you need anything in the future.

## 2024-01-20 ENCOUNTER — Telehealth (HOSPITAL_COMMUNITY): Payer: Self-pay | Admitting: Licensed Clinical Social Worker

## 2024-01-20 NOTE — Telephone Encounter (Signed)
 H&V Care Navigation CSW Progress Note  Clinical Social Worker spoke with patient to discuss lack of Medicare part D coverage.  Patient reports he was getting meds through [place] but they were cumbersome to work with so stopped doing so.  States he can afford current medications ok but understands if a more expensive med were prescribed this would be an issue.  States when he asked about part D in the past he was told there would be a penalty for not getting when he was eligible.  CSW sent message to [facility] counselor to clarify as there are usually rules about if you had coverage through another source the penalty should not apply- inquired if this would be true for this patients situation- awaiting response.   SDOH Screenings   Food Insecurity: No Food Insecurity (01/17/2024)  Housing: Unknown (01/17/2024)  Transportation Needs: No Transportation Needs (01/17/2024)  Utilities: Not At Risk (01/17/2024)  Depression (PHQ2-9): Low Risk  (01/17/2024)  Financial Resource Strain: Low Risk  (03/05/2023)  Tobacco Use: Low Risk  (01/19/2024)    Burna Sis, LCSW Clinical Social Worker [facility] Desk#: [phone] Cell#: [phone]

## 2024-01-28 ENCOUNTER — Telehealth (HOSPITAL_COMMUNITY): Payer: Self-pay | Admitting: Licensed Clinical Social Worker

## 2024-01-28 NOTE — Telephone Encounter (Signed)
 H&V Care Navigation CSW Progress Note  Clinical Social Worker heard back from [facility] team who reports that the patient would need to prove he had prescription coverage through the [place] in order to avoid a penalty.  Patient has no concerns affording prescriptions at this time so feels as if he would rather leave it be for now and if he gets prescribed a more costly med in the future he will go through the [place] again or pursue part D at that time.   SDOH Screenings   Food Insecurity: No Food Insecurity (01/17/2024)  Housing: Unknown (01/17/2024)  Transportation Needs: No Transportation Needs (01/17/2024)  Utilities: Not At Risk (01/17/2024)  Depression (PHQ2-9): Low Risk  (01/17/2024)  Financial Resource Strain: Low Risk  (03/05/2023)  Tobacco Use: Low Risk  (01/19/2024)   Burna Sis, LCSW Clinical Social Worker [facility] Desk#: [phone] Cell#: [phone]

## 2024-03-08 ENCOUNTER — Ambulatory Visit

## 2024-04-04 ENCOUNTER — Other Ambulatory Visit: Payer: Self-pay | Admitting: Family

## 2024-04-04 DIAGNOSIS — I5032 Chronic diastolic (congestive) heart failure: Secondary | ICD-10-CM

## 2024-04-14 ENCOUNTER — Other Ambulatory Visit: Payer: Self-pay | Admitting: Family

## 2024-04-26 DIAGNOSIS — I5022 Chronic systolic (congestive) heart failure: Secondary | ICD-10-CM | POA: Diagnosis not present

## 2024-04-26 DIAGNOSIS — Z950 Presence of cardiac pacemaker: Secondary | ICD-10-CM | POA: Diagnosis not present

## 2024-05-11 ENCOUNTER — Inpatient Hospital Stay: Payer: Medicare Other | Attending: Physician Assistant

## 2024-05-11 ENCOUNTER — Inpatient Hospital Stay: Payer: Medicare Other | Admitting: Oncology

## 2024-05-25 ENCOUNTER — Ambulatory Visit

## 2024-05-30 DIAGNOSIS — H43813 Vitreous degeneration, bilateral: Secondary | ICD-10-CM | POA: Diagnosis not present

## 2024-05-30 DIAGNOSIS — H353134 Nonexudative age-related macular degeneration, bilateral, advanced atrophic with subfoveal involvement: Secondary | ICD-10-CM | POA: Diagnosis not present

## 2024-07-10 DIAGNOSIS — I7121 Aneurysm of the ascending aorta, without rupture: Secondary | ICD-10-CM | POA: Diagnosis not present

## 2024-07-10 DIAGNOSIS — Z950 Presence of cardiac pacemaker: Secondary | ICD-10-CM | POA: Diagnosis not present

## 2024-07-10 DIAGNOSIS — I482 Chronic atrial fibrillation, unspecified: Secondary | ICD-10-CM | POA: Diagnosis not present

## 2024-07-10 DIAGNOSIS — I10 Essential (primary) hypertension: Secondary | ICD-10-CM | POA: Diagnosis not present

## 2024-07-10 DIAGNOSIS — I5022 Chronic systolic (congestive) heart failure: Secondary | ICD-10-CM | POA: Diagnosis not present

## 2024-07-10 DIAGNOSIS — G4733 Obstructive sleep apnea (adult) (pediatric): Secondary | ICD-10-CM | POA: Diagnosis not present

## 2024-07-10 DIAGNOSIS — E782 Mixed hyperlipidemia: Secondary | ICD-10-CM | POA: Diagnosis not present

## 2024-07-10 DIAGNOSIS — I2510 Atherosclerotic heart disease of native coronary artery without angina pectoris: Secondary | ICD-10-CM | POA: Diagnosis not present

## 2024-07-18 DIAGNOSIS — I7121 Aneurysm of the ascending aorta, without rupture: Secondary | ICD-10-CM | POA: Diagnosis not present

## 2024-07-19 ENCOUNTER — Other Ambulatory Visit: Payer: Self-pay | Admitting: Family

## 2024-10-10 DIAGNOSIS — I5022 Chronic systolic (congestive) heart failure: Secondary | ICD-10-CM | POA: Diagnosis not present
